# Patient Record
Sex: Female | Born: 1990 | Race: White | Hispanic: No | Marital: Married | Smoking: Never smoker
Health system: Southern US, Community
[De-identification: ages and names within clinical notes are randomized; demographics above are authoritative.]

## PROBLEM LIST (undated history)

## (undated) DIAGNOSIS — O139 Gestational [pregnancy-induced] hypertension without significant proteinuria, unspecified trimester: Secondary | ICD-10-CM

## (undated) HISTORY — PX: WISDOM TOOTH EXTRACTION: SHX21

---

## 2012-06-13 ENCOUNTER — Encounter: Payer: Self-pay | Admitting: Certified Nurse Midwife

## 2012-06-14 ENCOUNTER — Encounter: Payer: Self-pay | Admitting: Nurse Practitioner

## 2012-06-14 ENCOUNTER — Ambulatory Visit (INDEPENDENT_AMBULATORY_CARE_PROVIDER_SITE_OTHER): Payer: BC Managed Care – PPO | Admitting: Certified Nurse Midwife

## 2012-06-14 ENCOUNTER — Encounter: Payer: Self-pay | Admitting: Certified Nurse Midwife

## 2012-06-14 VITALS — BP 100/64 | HR 72 | Resp 12

## 2012-06-14 DIAGNOSIS — B373 Candidiasis of vulva and vagina: Secondary | ICD-10-CM

## 2012-06-14 MED ORDER — TERCONAZOLE 0.4 % VA CREA: 1.0000 | TOPICAL_CREAM | Freq: Every day | VAGINAL | Status: DC

## 2012-06-14 NOTE — Patient Instructions (Signed)
Candida Infection, Adult  A candida infection (also called yeast, fungus and Monilia infection) is an overgrowth of yeast that can occur anywhere on the body. A yeast infection commonly occurs in warm, moist body areas. Usually, the infection remains localized but can spread to become a systemic infection. A yeast infection may be a sign of a more severe disease such as diabetes, leukemia, or AIDS.  A yeast infection can occur in both men and women. In women, Candida vaginitis is a vaginal infection. It is one of the most common causes of vaginitis. Men usually do not have symptoms or know they have an infection until other problems develop. Men may find out they have a yeast infection because their sex partner has a yeast infection. Uncircumcised men are more likely to get a yeast infection than circumcised men. This is because the uncircumcised glans is not exposed to air and does not remain as dry as that of a circumcised glans. Older adults may develop yeast infections around dentures.  CAUSES   Women   Antibiotics.   Steroid medication taken for a long time.   Being overweight (obese).   Diabetes.   Poor immune condition.   Certain serious medical conditions.   Immune suppressive medications for organ transplant patients.   Chemotherapy.   Pregnancy.   Menstration.   Stress and fatigue.   Intravenous drug use.   Oral contraceptives.   Wearing tight-fitting clothes in the crotch area.   Catching it from a sex partner who has a yeast infection.   Spermicide.   Intravenous, urinary, or other catheters.  Men   Catching it from a sex partner who has a yeast infection.   Having oral or anal sex with a person who has the infection.   Spermicide.   Diabetes.   Antibiotics.   Poor immune system.   Medications that suppress the immune system.   Intravenous drug use.   Intravenous, urinary, or other catheters.  SYMPTOMS   Women   Thick, white vaginal discharge.   Vaginal itching.   Redness and  swelling in and around the vagina.   Irritation of the lips of the vagina and perineum.   Blisters on the vaginal lips and perineum.   Painful sexual intercourse.   Low blood sugar (hypoglycemia).   Painful urination.   Bladder infections.   Intestinal problems such as constipation, indigestion, bad breath, bloating, increase in gas, diarrhea, or loose stools.  Men   Men may develop intestinal problems such as constipation, indigestion, bad breath, bloating, increase in gas, diarrhea, or loose stools.   Dry, cracked skin on the penis with itching or discomfort.   Jock itch.   Dry, flaky skin.   Athlete's foot.   Hypoglycemia.  DIAGNOSIS   Women   A history and an exam are performed.   The discharge may be examined under a microscope.   A culture may be taken of the discharge.  Men   A history and an exam are performed.   Any discharge from the penis or areas of cracked skin will be looked at under the microscope and cultured.   Stool samples may be cultured.  TREATMENT   Women   Vaginal antifungal suppositories and creams.   Medicated creams to decrease irritation and itching on the outside of the vagina.   Warm compresses to the perineal area to decrease swelling and discomfort.   Oral antifungal medications.   Medicated vaginal suppositories or cream for repeated or recurrent infections.     Wash and dry the irritation areas before applying the cream.   Eating yogurt with lactobacillus may help with prevention and treatment.   Sometimes painting the vagina with gentian violet solution may help if creams and suppositories do not work.  Men   Antifungal creams and oral antifungal medications.   Sometimes treatment must continue for 30 days after the symptoms go away to prevent recurrence.  HOME CARE INSTRUCTIONS   Women   Use cotton underwear and avoid tight-fitting clothing.   Avoid colored, scented toilet paper and deodorant tampons or pads.   Do not douche.   Keep your diabetes  under control.   Finish all the prescribed medications.   Keep your skin clean and dry.   Consume milk or yogurt with lactobacillus active culture regularly. If you get frequent yeast infections and think that is what the infection is, there are over-the-counter medications that you can get. If the infection does not show healing in 3 days, talk to your caregiver.   Tell your sex partner you have a yeast infection. Your partner may need treatment also, especially if your infection does not clear up or recurs.  Men   Keep your skin clean and dry.   Keep your diabetes under control.   Finish all prescribed medications.   Tell your sex partner that you have a yeast infection so they can be treated if necessary.  SEEK MEDICAL CARE IF:    Your symptoms do not clear up or worsen in one week after treatment.   You have an oral temperature above 102 F (38.9 C).   You have trouble swallowing or eating for a prolonged time.   You develop blisters on and around your vagina.   You develop vaginal bleeding and it is not your menstrual period.   You develop abdominal pain.   You develop intestinal problems as mentioned above.   You get weak or lightheaded.   You have painful or increased urination.   You have pain during sexual intercourse.  MAKE SURE YOU:    Understand these instructions.   Will watch your condition.   Will get help right away if you are not doing well or get worse.  Document Released: 04/22/2004 Document Revised: 06/07/2011 Document Reviewed: 08/04/2009  ExitCare Patient Information 2013 [employer].

## 2012-06-14 NOTE — Progress Notes (Signed)
22 y.o.MarriedCaucasian female G0P0000 with a 10 day(s) history of the following:dyspareunia and local irritation Sexually active: yes Last sexual activity:8days ago. Pt also reports the following associated symptoms: none Patient has nottried over the counter treatment with not applicable relief.  Treated for Bacterial vaginitis 05/15/12. Used medication as directed all symptoms resolved.  No partner change. No STD concerns.  No new personal products.  Wears thongs daily  HPI: Pertinent to diagnosis Objective: Healthy female       Normal affect      Exam:  WJX:BJYNWG                NFA:OZHYQMVHQ: white and thick                Cx:  normal appearance                Uterus:normal size                Adnexa: normal adnexa  Wet Prep shows:WBC's Neg for clue cells  A;Yeast vaginitis  IO:NGEXBMW antifungal see orders.  Rx. Terazol 7 vaginal cream every hs x7 no refills Aveeno sitz bath prn Avoid prolonged thong use OTC lubricant for sexual activity  RV prn if not resolved

## 2012-07-25 NOTE — Progress Notes (Signed)
This encounter was created in error - please disregard.

## 2012-10-30 ENCOUNTER — Ambulatory Visit: Payer: BC Managed Care – PPO | Admitting: Nurse Practitioner

## 2012-10-30 DIAGNOSIS — Z01419 Encounter for gynecological examination (general) (routine) without abnormal findings: Secondary | ICD-10-CM

## 2012-11-02 ENCOUNTER — Encounter: Payer: Self-pay | Admitting: Nurse Practitioner

## 2012-11-02 ENCOUNTER — Ambulatory Visit (INDEPENDENT_AMBULATORY_CARE_PROVIDER_SITE_OTHER): Payer: BC Managed Care – PPO | Admitting: Nurse Practitioner

## 2012-11-02 VITALS — BP 120/60 | HR 56 | Ht 69.0 in | Wt 151.0 lb

## 2012-11-02 DIAGNOSIS — Z01419 Encounter for gynecological examination (general) (routine) without abnormal findings: Secondary | ICD-10-CM

## 2012-11-02 DIAGNOSIS — Z0000 Encounter for general adult medical examination without abnormal findings: Secondary | ICD-10-CM

## 2012-11-02 MED ORDER — NORETHIN ACE-ETH ESTRAD-FE 1-20 MG-MCG PO TABS: 1.0000 | ORAL_TABLET | Freq: Every day | ORAL | Status: DC

## 2012-11-02 NOTE — Patient Instructions (Addendum)
General topics  Next pap or exam is  due in 1 year Take a Women's multivitamin Take 1200 mg. of calcium daily - prefer dietary If any concerns in interim to call back  Breast Self-Awareness Practicing breast self-awareness may pick up problems early, prevent significant medical complications, and possibly save your life. By practicing breast self-awareness, you can become familiar with how your breasts look and feel and if your breasts are changing. This allows you to notice changes early. It can also offer you some reassurance that your breast health is good. One way to learn what is normal for your breasts and whether your breasts are changing is to do a breast self-exam. If you find a lump or something that was not present in the past, it is best to contact your caregiver right away. Other findings that should be evaluated by your caregiver include nipple discharge, especially if it is bloody; skin changes or reddening; areas where the skin seems to be pulled in (retracted); or new lumps and bumps. Breast pain is seldom associated with cancer (malignancy), but should also be evaluated by a caregiver. BREAST SELF-EXAM The best time to examine your breasts is 5 7 days after your menstrual period is over.  ExitCare Patient Information 2013 [employer].   Exercise to Stay Healthy Exercise helps you become and stay healthy. EXERCISE IDEAS AND TIPS Choose exercises that:  You enjoy.  Fit into your day. You do not need to exercise really hard to be healthy. You can do exercises at a slow or medium level and stay healthy. You can:  Stretch before and after working out.  Try yoga, Pilates, or tai chi.  Lift weights.  Walk fast, swim, jog, run, climb stairs, bicycle, dance, or rollerskate.  Take aerobic classes. Exercises that burn about 150 calories:  Running 1  miles in 15 minutes.  Playing volleyball for 45 to 60 minutes.  Washing and waxing a car for 45 to 60  minutes.  Playing touch football for 45 minutes.  Walking 1  miles in 35 minutes.  Pushing a stroller 1  miles in 30 minutes.  Playing basketball for 30 minutes.  Raking leaves for 30 minutes.  Bicycling 5 miles in 30 minutes.  Walking 2 miles in 30 minutes.  Dancing for 30 minutes.  Shoveling snow for 15 minutes.  Swimming laps for 20 minutes.  Walking up stairs for 15 minutes.  Bicycling 4 miles in 15 minutes.  Gardening for 30 to 45 minutes.  Jumping rope for 15 minutes.  Washing windows or floors for 45 to 60 minutes. Document Released: 04/17/2010 Document Revised: 06/07/2011 Document Reviewed: 04/17/2010 Exit[employer] Patient Information 2013 [employer].   Other topics ( that may be useful information):    Sexually Transmitted Disease Sexually transmitted disease (STD) refers to any infection that is passed from person to person during sexual activity. This may happen by way of saliva, semen, blood, vaginal mucus, or urine. Common STDs include:  Gonorrhea.  Chlamydia.  Syphilis.  HIV/AIDS.  Genital herpes.  Hepatitis B and C.  Trichomonas.  Human papillomavirus (HPV).  Pubic lice. CAUSES  An STD may be spread by bacteria, virus, or parasite. A person can get an STD by:  Sexual intercourse with an infected person.  Sharing sex toys with an infected person.  Sharing needles with an infected person.  Having intimate contact with the genitals, mouth, or rectal areas of an infected person. SYMPTOMS  Some people may not have any symptoms, but   they can still pass the infection to others. Different STDs have different symptoms. Symptoms include:  Painful or bloody urination.  Pain in the pelvis, abdomen, vagina, anus, throat, or eyes.  Skin rash, itching, irritation, growths, or sores (lesions). These usually occur in the genital or anal area.  Abnormal vaginal discharge.  Penile discharge in men.  Soft, flesh-colored skin growths in the  genital or anal area.  Fever.  Pain or bleeding during sexual intercourse.  Swollen glands in the groin area.  Yellow skin and eyes (jaundice). This is seen with hepatitis. DIAGNOSIS  To make a diagnosis, your caregiver may:  Take a medical history.  Perform a physical exam.  Take a specimen (culture) to be examined.  Examine a sample of discharge under a microscope.  Perform blood test TREATMENT   Chlamydia, gonorrhea, trichomonas, and syphilis can be cured with antibiotic medicine.  Genital herpes, hepatitis, and HIV can be treated, but not cured, with prescribed medicines. The medicines will lessen the symptoms.  Genital warts from HPV can be treated with medicine or by freezing, burning (electrocautery), or surgery. Warts may come back.  HPV is a virus and cannot be cured with medicine or surgery.However, abnormal areas may be followed very closely by your caregiver and may be removed from the cervix, vagina, or vulva through office procedures or surgery. If your diagnosis is confirmed, your recent sexual partners need treatment. This is true even if they are symptom-free or have a negative culture or evaluation. They should not have sex until their caregiver says it is okay. HOME CARE INSTRUCTIONS  All sexual partners should be informed, tested, and treated for all STDs.  Take your antibiotics as directed. Finish them even if you start to feel better.  Only take over-the-counter or prescription medicines for pain, discomfort, or fever as directed by your caregiver.  Rest.  Eat a balanced diet and drink enough fluids to keep your urine clear or pale yellow.  Do not have sex until treatment is completed and you have followed up with your caregiver. STDs should be checked after treatment.  Keep all follow-up appointments, Pap tests, and blood tests as directed by your caregiver.  Only use latex condoms and water-soluble lubricants during sexual activity. Do not use  petroleum jelly or oils.  Avoid alcohol and illegal drugs.  Get vaccinated for HPV and hepatitis. If you have not received these vaccines in the past, talk to your caregiver about whether one or both might be right for you.  Avoid risky sex practices that can break the skin. The only way to avoid getting an STD is to avoid all sexual activity.Latex condoms and dental dams (for oral sex) will help lessen the risk of getting an STD, but will not completely eliminate the risk. SEEK MEDICAL CARE IF:   You have a fever.  You have any new or worsening symptoms. Document Released: 06/05/2002 Document Revised: 06/07/2011 Document Reviewed: 06/12/2010 ExitCare Patient Information 2013 [employer].    Domestic Abuse You are being battered or abused if someone close to you hits, pushes, or physically hurts you in any way. You also are being abused if you are forced into activities. You are being sexually abused if you are forced to have sexual contact of any kind. You are being emotionally abused if you are made to feel worthless or if you are constantly threatened. It is important to remember that help is available. No one has the right to abuse you. PREVENTION OF FURTHER   ABUSE  Learn the warning signs of danger. This varies with situations but may include: the use of alcohol, threats, isolation from friends and family, or forced sexual contact. Leave if you feel that violence is going to occur.  If you are attacked or beaten, report it to the police so the abuse is documented. You do not have to press charges. The police can protect you while you or the attackers are leaving. Get the officer's name and badge number and a copy of the report.  Find someone you can trust and tell them what is happening to you: your caregiver, a nurse, clergy member, close friend or family member. Feeling ashamed is natural, but remember that you have done nothing wrong. No one deserves abuse. Document Released:  03/12/2000 Document Revised: 06/07/2011 Document Reviewed: 05/21/2010 ExitCare Patient Information 2013 [employer].    How Much is Too Much Alcohol? Drinking too much alcohol can cause injury, accidents, and health problems. These types of problems can include:   Car crashes.  Falls.  Family fighting (domestic violence).  Drowning.  Fights.  Injuries.  Burns.  Damage to certain organs.  Having a baby with birth defects. ONE DRINK CAN BE TOO MUCH WHEN YOU ARE:  Working.  Pregnant or breastfeeding.  Taking medicines. Ask your doctor.  Driving or planning to drive. If you or someone you know has a drinking problem, get help from a doctor.  Document Released: 01/09/2009 Document Revised: 06/07/2011 Document Reviewed: 01/09/2009 ExitCare Patient Information 2013 [employer].   Smoking Hazards Smoking cigarettes is extremely bad for your health. Tobacco smoke has over 200 known poisons in it. There are over 60 chemicals in tobacco smoke that cause cancer. Some of the chemicals found in cigarette smoke include:   Cyanide.  Benzene.  Formaldehyde.  Methanol (wood alcohol).  Acetylene (fuel used in welding torches).  Ammonia. Cigarette smoke also contains the poisonous gases nitrogen oxide and carbon monoxide.  Cigarette smokers have an increased risk of many serious medical problems and Smoking causes approximately:  90% of all lung cancer deaths in men.  80% of all lung cancer deaths in women.  90% of deaths from chronic obstructive lung disease. Compared with nonsmokers, smoking increases the risk of:  Coronary heart disease by 2 to 4 times.  Stroke by 2 to 4 times.  Men developing lung cancer by 23 times.  Women developing lung cancer by 13 times.  Dying from chronic obstructive lung diseases by 12 times.  . Smoking is the most preventable cause of death and disease in our society.  WHY IS SMOKING ADDICTIVE?  Nicotine is the chemical  agent in tobacco that is capable of causing addiction or dependence.  When you smoke and inhale, nicotine is absorbed rapidly into the bloodstream through your lungs. Nicotine absorbed through the lungs is capable of creating a powerful addiction. Both inhaled and non-inhaled nicotine may be addictive.  Addiction studies of cigarettes and spit tobacco show that addiction to nicotine occurs mainly during the teen years, when young people begin using tobacco products. WHAT ARE THE BENEFITS OF QUITTING?  There are many health benefits to quitting smoking.   Likelihood of developing cancer and heart disease decreases. Health improvements are seen almost immediately.  Blood pressure, pulse rate, and breathing patterns start returning to normal soon after quitting. QUITTING SMOKING   American Lung Association - 1-800-LUNGUSA  American Cancer Society - 1-800-ACS-2345 Document Released: 04/22/2004 Document Revised: 06/07/2011 Document Reviewed: 12/25/2008 ExitCare Patient Information 2013 [employer].   Stress Management Stress is a state of physical or mental tension that often results from changes in your life or normal routine. Some common causes of stress are:  Death of a loved one.  Injuries or severe illnesses.  Getting fired or changing jobs.  Moving into a new home. Other causes may be:  Sexual problems.  Business or financial losses.  Taking on a large debt.  Regular conflict with someone at home or at work.  Constant tiredness from lack of sleep. It is not just bad things that are stressful. It may be stressful to:  Win the lottery.  Get married.  Buy a new car. The amount of stress that can be easily tolerated varies from person to person. Changes generally cause stress, regardless of the types of change. Too much stress can affect your health. It may lead to physical or emotional problems. Too little stress (boredom) may also become stressful. SUGGESTIONS TO  REDUCE STRESS:  Talk things over with your family and friends. It often is helpful to share your concerns and worries. If you feel your problem is serious, you may want to get help from a professional counselor.  Consider your problems one at a time instead of lumping them all together. Trying to take care of everything at once may seem impossible. List all the things you need to do and then start with the most important one. Set a goal to accomplish 2 or 3 things each day. If you expect to do too many in a single day you will naturally fail, causing you to feel even more stressed.  Do not use alcohol or drugs to relieve stress. Although you may feel better for a short time, they do not remove the problems that caused the stress. They can also be habit forming.  Exercise regularly - at least 3 times per week. Physical exercise can help to relieve that "uptight" feeling and will relax you.  The shortest distance between despair and hope is often a good night's sleep.  Go to bed and get up on time allowing yourself time for appointments without being rushed.  Take a short "time-out" period from any stressful situation that occurs during the day. Close your eyes and take some deep breaths. Starting with the muscles in your face, tense them, hold it for a few seconds, then relax. Repeat this with the muscles in your neck, shoulders, hand, stomach, back and legs.  Take good care of yourself. Eat a balanced diet and get plenty of rest.  Schedule time for having fun. Take a break from your daily routine to relax. HOME CARE INSTRUCTIONS   Call if you feel overwhelmed by your problems and feel you can no longer manage them on your own.  Return immediately if you feel like hurting yourself or someone else. Document Released: 09/08/2000 Document Revised: 06/07/2011 Document Reviewed: 05/01/2007 ExitCare Patient Information 2013 [employer].   

## 2012-11-02 NOTE — Progress Notes (Signed)
Patient ID: Tara White, female   DOB: 1990-10-05, 22 y.o.   MRN: [id number] 22 y.o. G0P0 Married Caucasian Fe here for annual exam.  Menses last now at 5-6 days. Lighter cycles than before OCP. Some cramps but improved.  Not planning family yet, maybe in 1-2 years.  Married for 1 year and 3 months. Celebrated anniversary in [place].  Patient's last menstrual period was 10/17/2012.          Sexually active: yes  The current method of family planning is OCP (estrogen/progesterone).    Exercising: no  The patient does not participate in regular exercise at present. Smoker:  no  Health Maintenance: Pap:  10/26/11, WNL TDaP:  2009 Gardasil completed: 10/26/11 Labs: HB: PCP  Urine: Negative, pH 5.0   reports that she has never smoked. She does not have any smokeless tobacco history on file. She reports that she drinks about 0.5 ounces of alcohol per week. She reports that she does not use illicit drugs.  No past medical history on file.  Past Surgical History  Procedure Laterality Date  . Wisdom tooth extraction      Current Outpatient Prescriptions  Medication Sig Dispense Refill  . Norethin Ace-Eth Estrad-FE (LOESTRIN FE 1/20 PO) Take by mouth daily.      Marland Kitchen terconazole (TERAZOL 7) 0.4 % vaginal cream Place 1 applicator vaginally at bedtime. One applicator full QHS for seven days of therapy  45 g  0   No current facility-administered medications for this visit.    Family History  Problem Relation Age of Onset  . Hypothyroidism Mother     ROS:  Pertinent items are noted in HPI.  Otherwise, a comprehensive ROS was negative.  Exam:   BP 120/60  Pulse 56  Ht 5\' 9"  (1.753 m)  Wt 151 lb (68.493 kg)  BMI 22.29 kg/m2  LMP 10/17/2012 Height: 5\' 9"  (175.3 cm)  Ht Readings from Last 3 Encounters:  11/02/12 5\' 9"  (1.753 m)    General appearance: alert, cooperative and appears stated age Head: Normocephalic, without obvious abnormality, atraumatic Neck: no adenopathy, supple,  symmetrical, trachea midline and thyroid normal to inspection and palpation Lungs: clear to auscultation bilaterally Breasts: normal appearance, no masses or tenderness Heart: regular rate and rhythm Abdomen: soft, non-tender; no masses,  no organomegaly Extremities: extremities normal, atraumatic, no cyanosis or edema Skin: Skin color, texture, turgor normal. No rashes or lesions Lymph nodes: Cervical, supraclavicular, and axillary nodes normal. No abnormal inguinal nodes palpated Neurologic: Grossly normal   Pelvic: External genitalia:  no lesions              Urethra:  normal appearing urethra with no masses, tenderness or lesions              Bartholin's and Skene's: normal                 Vagina: normal appearing vagina with normal color and discharge, no lesions              Cervix: anteverted              Pap taken: no Bimanual Exam:  Uterus:  normal size, contour, position, consistency, mobility, non-tender              Adnexa: no mass, fullness, tenderness               Rectovaginal: Confirms               Anus:  normal sphincter  tone, no lesions  A:  Well Woman with normal exam  OCP for contraception  P:   Pap smear as per guidelines   Refill Loestrin Fe 1/20 for 1 year  Counseled on breast self exam, adequate intake of calcium and vitamin D, diet and exercise return annually or prn  An After Visit Summary was printed and given to the patient.

## 2012-11-03 NOTE — Progress Notes (Signed)
Encounter reviewed by Dr. Derral Colucci Silva.  

## 2013-01-08 ENCOUNTER — Encounter: Payer: Self-pay | Admitting: Nurse Practitioner

## 2013-01-08 ENCOUNTER — Ambulatory Visit (INDEPENDENT_AMBULATORY_CARE_PROVIDER_SITE_OTHER): Payer: BC Managed Care – PPO | Admitting: Nurse Practitioner

## 2013-01-08 VITALS — BP 110/66 | HR 56 | Temp 98.7°F | Ht 69.0 in | Wt 157.0 lb

## 2013-01-08 DIAGNOSIS — B373 Candidiasis of vulva and vagina: Secondary | ICD-10-CM

## 2013-01-08 MED ORDER — NONFORMULARY OR COMPOUNDED ITEM: Status: DC

## 2013-01-08 MED ORDER — FLUCONAZOLE 150 MG PO TABS: 150.0000 mg | ORAL_TABLET | Freq: Once | ORAL | Status: DC

## 2013-01-08 NOTE — Patient Instructions (Signed)
Monilial Vaginitis  Vaginitis in a soreness, swelling and redness (inflammation) of the vagina and vulva. Monilial vaginitis is not a sexually transmitted infection.  CAUSES   Yeast vaginitis is caused by yeast (candida) that is normally found in your vagina. With a yeast infection, the candida has overgrown in number to a point that upsets the chemical balance.  SYMPTOMS   · White, thick vaginal discharge.  · Swelling, itching, redness and irritation of the vagina and possibly the lips of the vagina (vulva).  · Burning or painful urination.  · Painful intercourse.  DIAGNOSIS   Things that may contribute to monilial vaginitis are:  · Postmenopausal and virginal states.  · Pregnancy.  · Infections.  · Being tired, sick or stressed, especially if you had monilial vaginitis in the past.  · Diabetes. Good control will help lower the chance.  · Birth control pills.  · Tight fitting garments.  · Using bubble bath, feminine sprays, douches or deodorant tampons.  · Taking certain medications that kill germs (antibiotics).  · Sporadic recurrence can occur if you become ill.  TREATMENT   Your caregiver will give you medication.  · There are several kinds of anti monilial vaginal creams and suppositories specific for monilial vaginitis. For recurrent yeast infections, use a suppository or cream in the vagina 2 times a week, or as directed.  · Anti-monilial or steroid cream for the itching or irritation of the vulva may also be used. Get your caregiver's permission.  · Painting the vagina with methylene blue solution may help if the monilial cream does not work.  · Eating yogurt may help prevent monilial vaginitis.  HOME CARE INSTRUCTIONS   · Finish all medication as prescribed.  · Do not have sex until treatment is completed or after your caregiver tells you it is okay.  · Take warm sitz baths.  · Do not douche.  · Do not use tampons, especially scented ones.  · Wear cotton underwear.  · Avoid tight pants and panty  hose.  · Tell your sexual partner that you have a yeast infection. They should go to their caregiver if they have symptoms such as mild rash or itching.  · Your sexual partner should be treated as well if your infection is difficult to eliminate.  · Practice safer sex. Use condoms.  · Some vaginal medications cause latex condoms to fail. Vaginal medications that harm condoms are:  · Cleocin cream.  · Butoconazole (Femstat®).  · Terconazole (Terazol®) vaginal suppository.  · Miconazole (Monistat®) (may be purchased over the counter).  SEEK MEDICAL CARE IF:   · You have a temperature by mouth above 102° F (38.9° C).  · The infection is getting worse after 2 days of treatment.  · The infection is not getting better after 3 days of treatment.  · You develop blisters in or around your vagina.  · You develop vaginal bleeding, and it is not your menstrual period.  · You have pain when you urinate.  · You develop intestinal problems.  · You have pain with sexual intercourse.  Document Released: 12/23/2004 Document Revised: 06/07/2011 Document Reviewed: 09/06/2008  ExitCare® Patient Information ©2014 [employer].

## 2013-01-08 NOTE — Progress Notes (Signed)
Subjective:     Patient ID: Tara White, female   DOB: [birth year], 22 y.o.   MRN: [id number]  HPI  22 yo G0,WM Fe  presents with history of vaginal itching X 4 days.  Some dryness with SA which also makes these symptoms worse. No bladder symptoms. No recent antibiotics.  She is quite frustrated that she continues to get yeast vaginitis.   Review of Systems  Constitutional: Negative for fever, chills and fatigue.  HENT: Negative.   Respiratory: Negative.   Gastrointestinal: Negative.   Genitourinary: Positive for vaginal discharge and dyspareunia.  Musculoskeletal: Negative.   Skin: Negative.   Neurological: Negative.   Psychiatric/Behavioral: Negative.        Objective:   Physical Exam  Constitutional: She appears well-developed and well-nourished.  Cardiovascular: Normal rate.   Pulmonary/Chest: Effort normal.  Abdominal: Soft. She exhibits no distension. There is no tenderness. There is no rebound and no guarding.  Genitourinary:  White thick vaginal discharge.   External areas of thick labia folds with small cuts consistent with chronic yeast.  Wet Prep:  PH: 4.0; NSS negative; KOH: + yeast.       Assessment:     Yeast vaginitis acute and chronic    Plan:     Discussed vaginal health care No Changes in soaps and detergents. Will try Boric acid caps for chronic management as directed Will do Diflucan 150 mg for now # 2 Lubrication prn for SA

## 2013-01-11 NOTE — Progress Notes (Signed)
Encounter reviewed by Dr. Brook Silva.  

## 2013-06-12 ENCOUNTER — Telehealth: Payer: Self-pay | Admitting: Nurse Practitioner

## 2013-06-12 NOTE — Telephone Encounter (Signed)
Pt wants to talk to the nurse about updating her medication.

## 2013-06-13 NOTE — Telephone Encounter (Signed)
LMTCB 06/13/13

## 2013-06-14 ENCOUNTER — Other Ambulatory Visit: Payer: Self-pay | Admitting: Obstetrics and Gynecology

## 2013-06-14 MED ORDER — NORETHINDRONE ACET-ETHINYL EST 1.5-30 MG-MCG PO TABS: 1.0000 | ORAL_TABLET | Freq: Every day | ORAL | Status: DC

## 2013-06-14 NOTE — Telephone Encounter (Signed)
Returning a call to Tracy °

## 2013-06-14 NOTE — Telephone Encounter (Signed)
Ok to try LoEstrin 1.5/30.  This is a stronger pill with 7 placebo pills at the end. Have her try it to see if the cycles are shorter and lighter.  I will put in an order.

## 2013-06-14 NOTE — Telephone Encounter (Signed)
Spoke with patient and message from Dr. Quincy Simmonds given.  She will start a this new pack after her next period and is agreeable. Will follow up prn.   Routing to provider for final review. Patient agreeable to disposition. Will close encounter

## 2013-06-14 NOTE — Telephone Encounter (Signed)
Spoke with patient. She is currently on generic Loestrin FE. She she is wondering about what her options would be to have a shorter, lighter period. She is having a period that lasts for 7 days.  She does not want to stop her cycles completely but wondering if she can change the strength of her pills to have less of a cycle. She would like to stay on ocp. I advised that I would send a message to the covering provider and would call her back with response. Patient agreeable to plan.     Routing to Dr. Quincy Simmonds as covering and  Cc Milford Cage, FNP

## 2013-10-30 ENCOUNTER — Ambulatory Visit: Payer: BC Managed Care – PPO | Admitting: Certified Nurse Midwife

## 2013-10-31 ENCOUNTER — Telehealth: Payer: Self-pay | Admitting: Certified Nurse Midwife

## 2013-10-31 NOTE — Telephone Encounter (Signed)
Patient [facility] her appt yesterday for yeast inf. Call to pt see if she still needed to reschedule the appt. [facility] fee applied

## 2014-01-15 ENCOUNTER — Ambulatory Visit: Payer: BC Managed Care – PPO | Admitting: Nurse Practitioner

## 2014-01-28 ENCOUNTER — Encounter: Payer: Self-pay | Admitting: Nurse Practitioner

## 2014-01-28 ENCOUNTER — Ambulatory Visit (INDEPENDENT_AMBULATORY_CARE_PROVIDER_SITE_OTHER): Payer: BC Managed Care – PPO | Admitting: Nurse Practitioner

## 2014-01-28 VITALS — BP 118/76 | HR 60 | Resp 14 | Ht 68.5 in | Wt 159.2 lb

## 2014-01-28 DIAGNOSIS — Z01419 Encounter for gynecological examination (general) (routine) without abnormal findings: Secondary | ICD-10-CM

## 2014-01-28 MED ORDER — NONFORMULARY OR COMPOUNDED ITEM: Status: DC

## 2014-01-28 MED ORDER — NORETHINDRONE ACET-ETHINYL EST 1.5-30 MG-MCG PO TABS: 1.0000 | ORAL_TABLET | Freq: Every day | ORAL | Status: DC

## 2014-01-28 NOTE — Progress Notes (Signed)
Patient ID: Tara White, female   DOB: 08/15/1990, 23 y.o.   MRN: [id number] 23 y.o. G0P0 Married Caucasian Fe here for annual exam.  Married for 2.5 years.  Starting on a family in a few years.  Menses no at 5 days.  Heavy X 2-3 days., moderate to light at end. Super tampon and pad.  Patient's last menstrual period was 01/28/2014.          Sexually active: yes  The current method of family planning is OCP (estrogen/progesterone).  Exercising: Yes The patient is a Contractor. Smoker: no  Health Maintenance: Pap: 10/26/11, WNL TDaP: 2009 Gardasil completed: 10/26/11 Labs:  HB:  Declined labs  Urine:  Mod. RBC's but patient currently on cycle.   reports that she has never smoked. She has never used smokeless tobacco. She reports that she drinks about 1.2 oz of alcohol per week. She reports that she does not use illicit drugs.  History reviewed. No pertinent past medical history.  Past Surgical History  Procedure Laterality Date  . Wisdom tooth extraction      Current Outpatient Prescriptions  Medication Sig Dispense Refill  . NONFORMULARY OR COMPOUNDED ITEM Boric acid suppository 200mg , size 0 gelatin capsule,1 pv three weekly.  With flare use daily pv for 7 days. 30 each 12  . Norethindrone Acetate-Ethinyl Estradiol (LOESTRIN 1.5/30, 21,) 1.5-30 MG-MCG tablet Take 1 tablet by mouth daily. 1 Package 7   No current facility-administered medications for this visit.    Family History  Problem Relation Age of Onset  . Hypothyroidism Mother   . Heart failure Paternal Grandfather     ROS:  Pertinent items are noted in HPI.  Otherwise, a comprehensive ROS was negative.  Exam:   BP 118/76 mmHg  Pulse 60  Resp 14  Ht 5' 8.5" (1.74 m)  Wt 159 lb 3.2 oz (72.213 kg)  BMI 23.85 kg/m2  LMP 01/28/2014 Height: 5' 8.5" (174 cm)  Ht Readings from Last 3 Encounters:  01/28/14 5' 8.5" (1.74 m)  01/08/13 5\' 9"  (1.753 m)  11/02/12 5\' 9"  (1.753 m)    General appearance: alert,  cooperative and appears stated age Head: Normocephalic, without obvious abnormality, atraumatic Neck: no adenopathy, supple, symmetrical, trachea midline and thyroid normal to inspection and palpation Lungs: clear to auscultation bilaterally Breasts: normal appearance, no masses or tenderness Heart: regular rate and rhythm Abdomen: soft, non-tender; no masses,  no organomegaly Extremities: extremities normal, atraumatic, no cyanosis or edema Skin: Skin color, texture, turgor normal. No rashes or lesions Lymph nodes: Cervical, supraclavicular, and axillary nodes normal. No abnormal inguinal nodes palpated Neurologic: Grossly normal   Pelvic: External genitalia:  no lesions              Urethra:  normal appearing urethra with no masses, tenderness or lesions              Bartholin's and Skene's: normal                 Vagina: normal appearing vagina with normal color and discharge, no lesions              Cervix: anteverted              Pap taken: No. Bimanual Exam:  Uterus:  normal size, contour, position, consistency, mobility, non-tender              Adnexa: no mass, fullness, tenderness  Rectovaginal: Confirms               Anus:  normal sphincter tone, no lesions  A:  Well Woman with normal exam  OCP for contraception  History of menorrhagia  History of chronic yeast  P:   Reviewed health and wellness pertinent to exam  Pap smear not taken today  Refill on Loestrin 1.5/30 for a year  Refill on Boric acid capsule  Counseled on breast self exam, use and side effects of OCP's, adequate intake of calcium and vitamin D, diet and exercise return annually or prn  An After Visit Summary was printed and given to the patient.

## 2014-01-28 NOTE — Patient Instructions (Signed)

## 2014-01-30 NOTE — Progress Notes (Signed)
Encounter reviewed by Dr. Brook Silva.  

## 2014-02-09 ENCOUNTER — Ambulatory Visit (INDEPENDENT_AMBULATORY_CARE_PROVIDER_SITE_OTHER): Payer: BC Managed Care – PPO | Admitting: Podiatry

## 2014-02-09 ENCOUNTER — Encounter: Payer: Self-pay | Admitting: Podiatry

## 2014-02-09 VITALS — BP 121/74 | HR 60 | Resp 12

## 2014-02-09 DIAGNOSIS — IMO0002 Reserved for concepts with insufficient information to code with codable children: Secondary | ICD-10-CM | POA: Insufficient documentation

## 2014-02-09 DIAGNOSIS — L03012 Cellulitis of left finger: Secondary | ICD-10-CM

## 2014-02-09 MED ORDER — CEPHALEXIN 500 MG PO CAPS: 500.0000 mg | ORAL_CAPSULE | Freq: Three times a day (TID) | ORAL | Status: DC

## 2014-02-09 NOTE — Addendum Note (Signed)
Addended by: Elta Guadeloupe on: 02/09/2014 09:05 AM   Modules accepted: Orders

## 2014-02-09 NOTE — Patient Instructions (Addendum)

## 2014-02-09 NOTE — Progress Notes (Signed)
   Subjective:    Patient ID: Tara White, female    DOB: December 26, 1990, 23 y.o.   MRN: [id number]  HPI Tara White, 23 year old female, presents the office today with complaints of left big toe nail pain, infection. She states that the area has been swollen and red over the last 4 weeks on the outside border of the left big toenail. She has recently startedincreasing pus in the area. She states that there is pain along the area particularly with pressure on his shoe gear. She's been applying Neosporin to it without much resolution in symptoms. She previously has had an ingrown toenail before for which she has drained the infection herself. Also states that her nails up and discolored for many years. She's had no prior treatment for that. No other complaints at this time.   Review of Systems  All other systems reviewed and are negative.      Objective:   Physical Exam Objective: AAO x3, NAD DP/PT pulses palpable bilaterally, CRT less than 3 seconds Protective sensation intact with Simms Weinstein monofilament, vibratory sensation intact, Achilles tendon reflex intact The lateral border of the left hallux nail there is erythema extending to the level of the IPJ. There is tenderness palpation of the lateral border of the left hallux nail without any pain over the medial border. There is small granuloma tissue-like formation at the distal aspect of the lateral nail border. There is purulence identified from the nail border. Nails have yellow discoloration and are dystrophic/brittle. No surrounding erythema or drainage from the remaining nail sites. MMT 5/5, ROM WNL No calf pain with compression/swelling/warmth/erythema.      Assessment & Plan:  23 year old female with left lateral hallux nail border paronychia; likely onychomycosis -Conservative vs. Surgical treatment discussed with the patient including all alternatives, risks, complications.  -At this time, recommend a partial nail avulsion due  to the paronychia. Risks and complications discussed with the patient. She understands and verbally consents to the procedure. Under sterile conditions a total of 2.5 cc of a 1:1 mixture of 2% lidocaine plain and 0.5% Marcaine plain was infiltrated in a hallux block fashion to the left hallux. The area was then prepped in a sterile fashion. A tourniquet was then applied. The lateral border of the left hallux nail was then sharply excised, making sure to remove the entire nail border. There was purulence identified from the nail border. Once the nail was removed, no further purulence or pockets of infection were noted. The area was irrigated. Silvadene was applied followed by a dry sterile dressing. After application of the dressing, the tourniquet was then removed and there was noted to be an immediate CRT to the digit. Patient tolerated the procedure well without complications.  -Post-procedure instructions were discussed with the patient and they verbally understood. -Keflex prescribed.  -Nail which was removed was sent to [facility] labs for evaluation of possible onychomycosis.  -Follow-up in 1 week, or sooner if any problems are to arise. Monitor for any clinical signs/symptoms of infection and directed to call the office immediately if any are to occur, or go directly to the ER. Call with any questions/concers/change in symptoms.

## 2014-02-19 ENCOUNTER — Encounter: Payer: Self-pay | Admitting: Nurse Practitioner

## 2014-02-19 ENCOUNTER — Telehealth: Payer: Self-pay | Admitting: Nurse Practitioner

## 2014-02-19 ENCOUNTER — Ambulatory Visit (INDEPENDENT_AMBULATORY_CARE_PROVIDER_SITE_OTHER): Payer: BC Managed Care – PPO | Admitting: Nurse Practitioner

## 2014-02-19 VITALS — BP 122/80 | HR 68 | Ht 68.5 in | Wt 158.0 lb

## 2014-02-19 DIAGNOSIS — N898 Other specified noninflammatory disorders of vagina: Secondary | ICD-10-CM

## 2014-02-19 DIAGNOSIS — B3731 Acute candidiasis of vulva and vagina: Secondary | ICD-10-CM

## 2014-02-19 DIAGNOSIS — B373 Candidiasis of vulva and vagina: Secondary | ICD-10-CM

## 2014-02-19 MED ORDER — TERCONAZOLE 0.4 % VA CREA: 1.0000 | TOPICAL_CREAM | Freq: Every day | VAGINAL | Status: DC

## 2014-02-19 NOTE — Progress Notes (Signed)
23 y.o. SW Fe here with complaint of vaginal symptoms of itching, burning, and increase discharge. Describes discharge as white and thick. Recent antibiotics with Keflex for ingrown toenail removal.  Onset of symptoms 7 days ago. Denies new personal products or vaginal dryness.  She has taken 2 Diflucan without help.  Last Wednesday and again on Saturday.   No STD concerns. She is on OCP for contraception.  Urinary symptoms: none.   O:Healthy female WDWN Affect: normal, orientation x 3  Exam: no acute distress Abdomen: soft and non tender Lymph node: no enlargement or tenderness Pelvic exam: External genital:  BUS: redness and pealing of skin consistent with yeast Vagina: white discharge noted. Ph:   ,Wet prep taken Cervix: normal, non tender     Affirm Wet Prep results are pending   A: Yeast vaginitis  R/O BV as well   P: Discussed findings of yeast and etiology. Discussed Aveeno or baking soda sitz bath for comfort. Avoid moist clothes or pads for extended period of time. If working out in gym clothes or swim suits for long periods of time change underwear or bottoms of swimsuit if possible. Olive Oil use for skin protection prior to activity can be used to external skin.  Rx: Terazol vaginal cream HS X 7  Await Affirm results and treat if needed  RV prn

## 2014-02-19 NOTE — Patient Instructions (Signed)

## 2014-02-19 NOTE — Telephone Encounter (Signed)
Spoke with patient. Patient states that she thinks she has a bacterial infection and is only able to be seen around 2:45pm today. Appointment scheduled for 3:30pm today with Milford Cage, [place]. Patient is agreeable to date and time.  Routing to provider for final review. Patient agreeable to disposition. Will close encounter.

## 2014-02-19 NOTE — Telephone Encounter (Signed)
Patient thinks she has a bacterial infection and was asking for a prescription called to her pharmacy. I told patient she probably will  need an appointment. Patient said she is only available around 2:45 today. I am unable to schedule her today due to schedule being held at providers request.

## 2014-02-20 ENCOUNTER — Telehealth: Payer: Self-pay | Admitting: *Deleted

## 2014-02-20 ENCOUNTER — Encounter: Payer: Self-pay | Admitting: Podiatry

## 2014-02-20 ENCOUNTER — Ambulatory Visit (INDEPENDENT_AMBULATORY_CARE_PROVIDER_SITE_OTHER): Payer: BC Managed Care – PPO | Admitting: Podiatry

## 2014-02-20 VITALS — BP 138/80 | HR 55 | Resp 18

## 2014-02-20 DIAGNOSIS — L03012 Cellulitis of left finger: Secondary | ICD-10-CM

## 2014-02-20 LAB — WET PREP BY MOLECULAR PROBE
Candida species: NEGATIVE
GARDNERELLA VAGINALIS: POSITIVE — AB
Trichomonas vaginosis: NEGATIVE

## 2014-02-20 MED ORDER — METRONIDAZOLE 500 MG PO TABS: 500.0000 mg | ORAL_TABLET | Freq: Two times a day (BID) | ORAL | Status: DC

## 2014-02-20 NOTE — Telephone Encounter (Signed)
I have attempted to contact this patient by phone with the following results: left message to return call to [place] at [phone] on answering machine (mobile per [facility]).  No personal information given.  [phone] (Mobile)

## 2014-02-20 NOTE — Telephone Encounter (Signed)
-----   Message from Lyman Speller, MD sent at 02/20/2014 11:20 AM EST ----- Please inform pt, test is positive for BV.  Can treat with metrogel 0.75% qhs x 5 days or flagyl 500mg  bid x 7 days.  Pt needs to call back if still symptomatic.

## 2014-02-20 NOTE — Patient Instructions (Signed)
Continue soaking in epsom salt soaks and cover with antibiotic ointment and a band-aid until completely healed. Can leave uncovered at night. Monitor for any signs/symptoms of infection. Call the office immediately if any occur or go directly to the emergency room. Call with any questions/concerns.

## 2014-02-20 NOTE — Addendum Note (Signed)
Addended by: Graylon Good on: 02/20/2014 12:23 PM   Modules accepted: Orders

## 2014-02-20 NOTE — Progress Notes (Signed)
Reviewed personally.  M. Suzanne Delos Klich, MD.  

## 2014-02-25 NOTE — Progress Notes (Signed)
Patient ID: Tara White, female   DOB: [birth year], 23 y.o.   MRN: [id number]  Subjective: 23 year old female presents for follow-up evaluation status post left lateral hallux partial nail avulsion secondary to paronychia. She states that the area has healed. She does that she periodically gets some drainage from the area however denies any purulence. She states there is some mild redness remaining however it is decreased compared to prior evaluation. She denies any pain to the area. She has discontinue soaking or covering the area. No other complaints at this time. Denies any acute changes since last appointment.  Objective: AAO x3, NAD DP/PT pulses palpable bilaterally, CRT less than 3 seconds Protective sensation intact with Simms Weinstein monofilament, vibratory sensation intact, Achilles tendon reflex intact Status post left lateral hallux partial nail avulsion. There is small amount of hyperkeratotic tissue within the procedure site. There is mild erythema to the area however descended family decreased compared to prior. There is no drainage/purulent identified. No ascending saline's. No areas of fluctuance or crepitus. No tenderness to palpation overlying procedure site. MMT 5/5, ROM WNL No pain with calf compression, swelling, warmth, erythema.  Assessment: 23 year old female status post left lateral hallux partial nail avulsion secondary to paronychia  Plan: -Treatment options discussed including alternatives, risks, complications. -Small amount of hyperkeratotic tissue sharply debrided from the procedure site without complications. -Recommend continuing to soak the foot twice a day and Epsom salts followed by interbody ointment and a Band-Aid and the taper can leave uncovered at night. Continue this until completely healed. Monitor for any clinical signs or symptoms of infection and directed to call the office immediately if any are to occur or go to the emergency room. [facility] call patient  with results of the nail biopsy once they are obtained. -Follow-up in 2 weeks if the area has not completely healed after his any redness. Also follow-up sooner if any increase in symptoms. In the meantime, call the office with any questions, concerns, change in symptoms.

## 2014-02-26 NOTE — Telephone Encounter (Signed)
Patient has a question regarding the medication prescribed for her bacterial infection.

## 2014-02-26 NOTE — Telephone Encounter (Signed)
Spoke with patient. Patient states that she is currently taking Flagyl for BV. Patient has 2 1/2 days left to complete Flagyl. Patient has developed vaginal irritation and discomfort over the last two days. "I know the nurse told me this could cause a yeast infection and this is exactly what they felt like in the past." Patient has current Terazol rx for yeast infection and would like to know if she can use this at the same time as treatment for BV. "I didn't know if that would just counteract it and make it come back?" Advised patient to complete prescription of Flagyl before starting treatment for yeast. Advised may use OTC hydrocortisone ointment and Aveeno sitz bath for comfort until she has completed Flagyl rx. Patient is agreeable and verbalizes understanding.  Routing to provider for final review. Patient agreeable to disposition. Will close encounter

## 2014-03-04 ENCOUNTER — Encounter: Payer: Self-pay | Admitting: Certified Nurse Midwife

## 2014-03-04 ENCOUNTER — Ambulatory Visit (INDEPENDENT_AMBULATORY_CARE_PROVIDER_SITE_OTHER): Payer: BC Managed Care – PPO | Admitting: Certified Nurse Midwife

## 2014-03-04 VITALS — BP 104/64 | HR 64 | Resp 16 | Ht 68.5 in | Wt 157.0 lb

## 2014-03-04 DIAGNOSIS — N762 Acute vulvitis: Secondary | ICD-10-CM

## 2014-03-04 MED ORDER — NYSTATIN 100000 UNIT/GM EX CREA: 1.0000 "application " | TOPICAL_CREAM | Freq: Two times a day (BID) | CUTANEOUS | Status: DC

## 2014-03-04 NOTE — Patient Instructions (Signed)

## 2014-03-04 NOTE — Progress Notes (Signed)
23 y.o. married white female  g0p0 here with complaint of vaginal symptoms of external irritation and discomfort with intercourse." Feels like I need to use lotion inside". No STD concerns. No new personal products. Patient was treated 02/19/14 for BV with Metrogel with discharge resolved and then developed discomfort with sexual activity so treated with Diflucan 4 days ago. Symptoms have resolved except for external area. No STD concerns. 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 Tries to stay dry, but doesn't change underwear. Partner denies symptoms. No other health issues. Contraception OCP.   O:Healthy female WDWN Affect: normal, orientation x 3  Exam: Abdomen:soft non tender Lymph node: no enlargement or tenderness Pelvic exam: External genital: increase pink, with scaling, non tender, some white exudate wet prep taken BUS: negative Vagina: normal appearing discharge noted. Ph:3.5   ,Wet prep taken Cervix: normal, non tender Uterus: normal, non tender Adnexa:normal, non tender, no masses or fullness noted   Wet Prep results: yeast external only  A: Yeast Vulvitis   P:Discussed findings of yeast vulvitis and etiology. Discussed Aveeno or baking soda sitz bath for comfort. Avoid moist clothes or pads for extended period of time. If working out in gym clothes or swim suits for long periods of time change underwear or bottoms of swimsuit if possible. Coconut oil can be used for skin protection prior to activity can be used to external skin. Rx: Nystatin see order with instructions  Rv prn

## 2014-03-06 NOTE — Progress Notes (Signed)
Reviewed personally.  M. Suzanne Presly Steinruck, MD.  

## 2014-03-15 ENCOUNTER — Encounter: Payer: Self-pay | Admitting: Podiatry

## 2014-04-03 ENCOUNTER — Ambulatory Visit: Payer: BC Managed Care – PPO | Admitting: Podiatry

## 2014-04-05 ENCOUNTER — Ambulatory Visit (INDEPENDENT_AMBULATORY_CARE_PROVIDER_SITE_OTHER): Payer: 59 | Admitting: Podiatry

## 2014-04-05 ENCOUNTER — Encounter: Payer: Self-pay | Admitting: Podiatry

## 2014-04-05 VITALS — BP 129/78 | HR 66 | Resp 10

## 2014-04-05 DIAGNOSIS — B351 Tinea unguium: Secondary | ICD-10-CM

## 2014-04-05 MED ORDER — TAVABOROLE 5 % EX SOLN: 1.0000 [drp] | CUTANEOUS | Status: DC

## 2014-04-08 NOTE — Progress Notes (Signed)
Patient ID: Tara White, female   DOB: 05-04-1990, 24 y.o.   MRN: [id number]  Subjective: 24 year old female returns the office today to discuss nail biopsy results. She denies any acute changes since last appointment no other complaints at this time. She states that the procedure site and left lateral hallux has healed and she's had no problems with the area.  Objective: AAO x3, NAD DP/PT pulses palpable bilaterally, CRT less than 3 seconds Protective sensation intact with Simms Weinstein monofilament, vibratory sensation intact, Achilles tendon reflex intact Left lateral hallux partial nail avulsion site has healed at this time. There is no tenderness to palpation overlying the area and there is no swelling erythema, ascending cellulitis, drainage/purulence. Nails are discolored, dystrophic and slightly hypertrophic. No swelling erythema or drainage from the nail sites. No open lesions or pre-ulcerative lesions are identified. No overlying edema, erythema, increased warmth to bilateral lower extremities. MMT 5/5, ROM WNL No pain with calf compression, swelling, warmth, erythema.   Assessment: 24 year old female onychomycosis  Plan: -Nail biopsy results reviewed with the patient which revealed onychomycosis (Saprophytic fungi). Discussed various treatment options nail fungus. At this time the patient has elected proceed with topical treatment knowing that may not be the best option for this particular fungus. A prescription for [place] Mon was sent to our excrescence the patient was given information to follow-up at the pharmacy. Side effects the medication were discussed with the patient and directed to call the office if any are to occur. She would like to try the topical medication first, and if she does not notice any improvement in symptoms, will consider lamisil.  -Follow-up as needed. In the meantime, encouraged to call the office any questions, concerns, change in symptoms.

## 2014-04-15 NOTE — Progress Notes (Signed)
Discussed with patient

## 2014-06-10 ENCOUNTER — Ambulatory Visit (INDEPENDENT_AMBULATORY_CARE_PROVIDER_SITE_OTHER): Payer: 59 | Admitting: Certified Nurse Midwife

## 2014-06-10 ENCOUNTER — Encounter: Payer: Self-pay | Admitting: Certified Nurse Midwife

## 2014-06-10 VITALS — BP 120/70 | HR 68 | Resp 16 | Ht 68.75 in | Wt 157.0 lb

## 2014-06-10 DIAGNOSIS — B373 Candidiasis of vulva and vagina: Secondary | ICD-10-CM | POA: Diagnosis not present

## 2014-06-10 DIAGNOSIS — B3731 Acute candidiasis of vulva and vagina: Secondary | ICD-10-CM

## 2014-06-10 MED ORDER — NYSTATIN-TRIAMCINOLONE 100000-0.1 UNIT/GM-% EX CREA: 1.0000 "application " | TOPICAL_CREAM | Freq: Two times a day (BID) | CUTANEOUS | Status: DC

## 2014-06-10 NOTE — Patient Instructions (Signed)

## 2014-06-10 NOTE — Progress Notes (Signed)
24 y.o.Married white female g0p0 here with complaint of vaginal symptoms of itching, burning, and increase discharge. Describes discharge as small amount of white, no odor..Onset of symptoms 7 days ago. Denies new personal products .No STD concerns. Urinary symptoms none . Contraception is OCP. Patient has noted this is occuring after period has stopped. Patient took 2 Diflucan in the past week with some relief of discharge. No bleeding or spotting or pain with sexual activity.   O:Healthy female WDWN Affect: normal, orientation x 3  Exam: Abdomen: non tender Lymph node: no enlargement or tenderness Pelvic exam: External genital: normal female with scaling and cracking in folds of vulva with exudate noted. Wet prep taken BUS: negative Vagina: watery milky slight odorous discharge noted. Ph 4.0 Affirm taken Cervix: normal, non tender, no CMT Uterus: normal, non tender Adnexa:normal, non tender, no masses or fullness noted   Wet Prep results: positive yeast on vulva area   A:Normal pelvic exam Yeast vulvitis R/O vaginal BV/Yeast with affirm   P:Discussed findings of yeast vulvitis and etiology. Discussed Aveeno or baking soda sitz bath for comfort. Avoid moist clothes or pads for extended period of time. If working out in gym clothes or swim suits for long periods of time change underwear or bottoms of swimsuit if possible. Olive Oil/Coconut Oil use for skin protection prior to activity can be used to external skin. [id number] see order Await affirm results. Start on oral refrigerated probiotic to help immune system for prevention. Has used Boric acid capsules previously with no change.  Rv prn

## 2014-06-11 ENCOUNTER — Other Ambulatory Visit: Payer: Self-pay | Admitting: Certified Nurse Midwife

## 2014-06-11 DIAGNOSIS — B9689 Other specified bacterial agents as the cause of diseases classified elsewhere: Secondary | ICD-10-CM

## 2014-06-11 DIAGNOSIS — N760 Acute vaginitis: Principal | ICD-10-CM

## 2014-06-11 LAB — WET PREP BY MOLECULAR PROBE
Candida species: NEGATIVE
GARDNERELLA VAGINALIS: POSITIVE — AB
TRICHOMONAS VAG: NEGATIVE

## 2014-06-11 MED ORDER — METRONIDAZOLE 500 MG PO TABS: 500.0000 mg | ORAL_TABLET | Freq: Two times a day (BID) | ORAL | Status: DC

## 2014-06-12 NOTE — Progress Notes (Signed)
Reviewed personally.  M. Suzanne Rahn Lacuesta, MD.  

## 2014-07-01 ENCOUNTER — Ambulatory Visit (INDEPENDENT_AMBULATORY_CARE_PROVIDER_SITE_OTHER): Payer: 59 | Admitting: Certified Nurse Midwife

## 2014-07-01 ENCOUNTER — Encounter: Payer: Self-pay | Admitting: Certified Nurse Midwife

## 2014-07-01 VITALS — BP 110/72 | HR 68 | Resp 16 | Ht 68.75 in | Wt 157.0 lb

## 2014-07-01 DIAGNOSIS — N898 Other specified noninflammatory disorders of vagina: Secondary | ICD-10-CM

## 2014-07-01 NOTE — Progress Notes (Signed)
Reviewed personally.  M. Suzanne Joffrey Kerce, MD.  

## 2014-07-01 NOTE — Progress Notes (Signed)
24 y.o.Married white female g0p0 here for follow up of  Yeast vulvitis and pain with sexual activity at times. Treated with Mycolog cream with good results. Denies any vulva irritation now at all. Had recent episode of pain with sexual activity and irritated feeling again. Used Aveeno bath with some relief, but felt like this was deep in vagina. Seems to be resolved now. Denies any vaginal symptoms today.  O:Healthy female WDWN Affect: normal, orientation x 3  Exam: Abdomen: non tender Lymph node: no enlargement or tenderness Pelvic exam: External genital: normal female,no redness, scaling or lesions or tenderness BUS: negative Vagina: scant normal appearing physiological discharge noted.No point of pain identified on sweep of vaginal exam , Affirm taken Cervix: normal, non tender, no CMT Uterus: normal, non tender Adnexa:normal, non tender, no masses or fullness noted   A:Normal pelvic exam History of yeast vulvitis with sexual activity only, spouse no issues with.   P:Discussed findings of normal pelvic exam .Continue Aveeno or baking soda sitz bath for comfort. Avoid moist clothes or pads for extended period of time. If working out in gym clothes or swim suits for long periods of time change underwear or bottoms of swimsuit if possible.  Continue Coconut Oil use for skin protection prior to sexual activity and for lubrication if needed. Await affirm results and treat as indicated.  Rv prn

## 2014-07-02 ENCOUNTER — Other Ambulatory Visit: Payer: Self-pay | Admitting: Certified Nurse Midwife

## 2014-07-02 DIAGNOSIS — B9689 Other specified bacterial agents as the cause of diseases classified elsewhere: Secondary | ICD-10-CM

## 2014-07-02 DIAGNOSIS — N760 Acute vaginitis: Principal | ICD-10-CM

## 2014-07-02 LAB — WET PREP BY MOLECULAR PROBE
CANDIDA SPECIES: NEGATIVE
GARDNERELLA VAGINALIS: POSITIVE — AB
TRICHOMONAS VAG: NEGATIVE

## 2014-07-02 MED ORDER — CLINDAMYCIN PHOSPHATE 2 % VA CREA: TOPICAL_CREAM | VAGINAL | Status: DC

## 2014-07-22 ENCOUNTER — Telehealth: Payer: Self-pay | Admitting: Certified Nurse Midwife

## 2014-07-22 ENCOUNTER — Ambulatory Visit: Payer: 59 | Admitting: Certified Nurse Midwife

## 2014-07-22 NOTE — Telephone Encounter (Signed)
Left message on voicemail to reschedule reck appointment that she missed this afternoon.

## 2014-07-25 ENCOUNTER — Telehealth: Payer: Self-pay | Admitting: Nurse Practitioner

## 2014-07-25 NOTE — Telephone Encounter (Signed)
Spoke with patient. She has been doing a very low carb diet for the last two weeks and doing light aerobics. She is taking Loestrin 1.5/30 and has been for 3 years, reports regular cycles monthly.  Has not missed any pills. Usually starts cycle 5 days into placebo pills (On Tuesdays) and has not started a cycle this month. Patient is sexually active. Patient reports she is now eating a regular diet and has stopped low carb dieting.   Advised patient to take OTC home pregnancy test and return call to office if positive. Advised patient cycle may be irregular due to recent diet change and stress to body. Also could have decreased cycle with Loestrin. Advised to keep a bleeding calendar and to call back if no cycle in one month. Patient declines appointment at this time.   Advised patient Milford Cage, FNP with review message and would return call with any additional instructions. Patient is agreeable and advised to return call with any concerns or if would like office visit for evaluation.   Milford Cage, FNP agree to instructions?

## 2014-07-25 NOTE — Telephone Encounter (Signed)
Pt states she is having an 'amenorrhea bout'  Recently dieting and went off birth control to have her period but has not gotten it. States normally starts within 3 days but it has been 6.

## 2014-07-25 NOTE — Telephone Encounter (Signed)
Message left to return call to Tara White at [phone].    

## 2014-07-26 NOTE — Telephone Encounter (Signed)
Yes agreeable with instructions.

## 2014-08-16 ENCOUNTER — Encounter: Payer: Self-pay | Admitting: Nurse Practitioner

## 2014-08-16 ENCOUNTER — Ambulatory Visit (INDEPENDENT_AMBULATORY_CARE_PROVIDER_SITE_OTHER): Payer: 59 | Admitting: Nurse Practitioner

## 2014-08-16 VITALS — BP 120/82 | HR 64 | Ht 68.75 in | Wt 154.0 lb

## 2014-08-16 DIAGNOSIS — N760 Acute vaginitis: Secondary | ICD-10-CM

## 2014-08-16 MED ORDER — TINIDAZOLE 500 MG PO TABS: ORAL_TABLET | ORAL | Status: DC

## 2014-08-16 NOTE — Progress Notes (Signed)
24 y.o.Married white female G0P0 here with complaint of vaginal symptoms of itching, burning, and slight discharge. Describes discharge as white if there is any that she sees.  Main symptoms is painful intercourse.  For the past 3 times - the Affirm test is + for BV and has used Cleocin, Metrogel, and Diflucan.  Symptoms will go away for short periods of time and then back again. Onset of symptoms 2 days ago. Denies new personal products or vaginal dryness. No STD concerns. Urinary symptoms none . Contraception is OCP   O:Healthy female WDWN Affect: normal, orientation x 3  Exam: alert and in no distress Abdomen: soft and non tender Lymph node: no enlargement or tenderness Pelvic exam: External genital: normal female BUS: negative Vagina: thin white discharge noted. Affirm taken Cervix: normal, non tender, no CMT Uterus: normal, non tender Adnexa:normal, non tender, no masses or fullness noted   A: Recurrent BV   P: Discussed findings of vaginitis and etiology. Discussed Aveeno or baking soda sitz bath for comfort. Avoid moist clothes or pads for extended period of time. If working out in gym clothes or swim suits for long periods of time change underwear or bottoms of swimsuit if possible. Olive Oil/Coconut Oil use for skin protection prior to activity can be used to external skin.  Rx: use Tindamax 500 mg BID for a week  Will follow with Affirm test  May consider after Tindamax to take Diflucan weekly X 4  RV prn

## 2014-08-16 NOTE — Patient Instructions (Addendum)
Monilial Vaginitis Vaginitis in a soreness, swelling and redness (inflammation) of the vagina and vulva. Monilial vaginitis is not a sexually transmitted infection. CAUSES  Yeast vaginitis is caused by yeast (candida) that is normally found in your vagina. With a yeast infection, the candida has overgrown in number to a point that upsets the chemical balance. SYMPTOMS   White, thick vaginal discharge.  Swelling, itching, redness and irritation of the vagina and possibly the lips of the vagina (vulva).  Burning or painful urination.  Painful intercourse. DIAGNOSIS  Things that may contribute to monilial vaginitis are:  Postmenopausal and virginal states.  Pregnancy.  Infections.  Being tired, sick or stressed, especially if you had monilial vaginitis in the past.  Diabetes. Good control will help lower the chance.  Birth control pills.  Tight fitting garments.  Using bubble bath, feminine sprays, douches or deodorant tampons.  Taking certain medications that kill germs (antibiotics).  Sporadic recurrence can occur if you become ill. TREATMENT  Your caregiver will give you medication.  There are several kinds of anti monilial vaginal creams and suppositories specific for monilial vaginitis. For recurrent yeast infections, use a suppository or cream in the vagina 2 times a week, or as directed.  Anti-monilial or steroid cream for the itching or irritation of the vulva may also be used. Get your caregiver's permission.  Painting the vagina with methylene blue solution may help if the monilial cream does not work.  Eating yogurt may help prevent monilial vaginitis. HOME CARE INSTRUCTIONS   Finish all medication as prescribed.  Do not have sex until treatment is completed or after your caregiver tells you it is okay.  Take warm sitz baths.  Do not douche.  Do not use tampons, especially scented ones.  Wear cotton underwear.  Avoid tight pants and panty  hose.  Tell your sexual partner that you have a yeast infection. They should go to their caregiver if they have symptoms such as mild rash or itching.  Your sexual partner should be treated as well if your infection is difficult to eliminate.  Practice safer sex. Use condoms.  Some vaginal medications cause latex condoms to fail. Vaginal medications that harm condoms are:  Cleocin cream.  Butoconazole (Femstat).  Terconazole (Terazol) vaginal suppository.  Miconazole (Monistat) (may be purchased over the counter). SEEK MEDICAL CARE IF:   You have a temperature by mouth above 102 F (38.9 C).  The infection is getting worse after 2 days of treatment.  The infection is not getting better after 3 days of treatment.  You develop blisters in or around your vagina.  You develop vaginal bleeding, and it is not your menstrual period.  You have pain when you urinate.  You develop intestinal problems.  You have pain with sexual intercourse. Document Released: 12/23/2004 Document Revised: 06/07/2011 Document Reviewed: 09/06/2008 [facility] Patient Information 2015 [place], [place]. This information is not intended to replace advice given to you by your health care provider. Make sure you discuss any questions you have with your health care provider. Bacterial Vaginosis Bacterial vaginosis is a vaginal infection that occurs when the normal balance of bacteria in the vagina is disrupted. It results from an overgrowth of certain bacteria. This is the most common vaginal infection in women of childbearing age. Treatment is important to prevent complications, especially in pregnant women, as it can cause a premature delivery. CAUSES  Bacterial vaginosis is caused by an increase in harmful bacteria that are normally present in smaller amounts in the  vagina. Several different kinds of bacteria can cause bacterial vaginosis. However, the reason that the condition develops is not fully  understood. RISK FACTORS Certain activities or behaviors can put you at an increased risk of developing bacterial vaginosis, including:  Having a new sex partner or multiple sex partners.  Douching.  Using an intrauterine device (IUD) for contraception. Women do not get bacterial vaginosis from toilet seats, bedding, swimming pools, or contact with objects around them. SIGNS AND SYMPTOMS  Some women with bacterial vaginosis have no signs or symptoms. Common symptoms include:  Grey vaginal discharge.  A fishlike odor with discharge, especially after sexual intercourse.  Itching or burning of the vagina and vulva.  Burning or pain with urination. DIAGNOSIS  Your health care provider will take a medical history and examine the vagina for signs of bacterial vaginosis. A sample of vaginal fluid may be taken. Your health care provider will look at this sample under a microscope to check for bacteria and abnormal cells. A vaginal pH test may also be done.  TREATMENT  Bacterial vaginosis may be treated with antibiotic medicines. These may be given in the form of a pill or a vaginal cream. A second round of antibiotics may be prescribed if the condition comes back after treatment.  HOME CARE INSTRUCTIONS   Only take over-the-counter or prescription medicines as directed by your health care provider.  If antibiotic medicine was prescribed, take it as directed. Make sure you finish it even if you start to feel better.  Do not have sex until treatment is completed.  Tell all sexual partners that you have a vaginal infection. They should see their health care provider and be treated if they have problems, such as a mild rash or itching.  Practice safe sex by using condoms and only having one sex partner. SEEK MEDICAL CARE IF:   Your symptoms are not improving after 3 days of treatment.  You have increased discharge or pain.  You have a fever. MAKE SURE YOU:   Understand these  instructions.  Will watch your condition.  Will get help right away if you are not doing well or get worse. FOR MORE INFORMATION  Centers for Disease Control and Prevention, Division of STD Prevention: AppraiserFraud.fi American Sexual Health Association (ASHA): www.ashastd.org  Document Released: 03/15/2005 Document Revised: 01/03/2013 Document Reviewed: 10/25/2012 [facility] Patient Information 2015 [place], [place]. This information is not intended to replace advice given to you by your health care provider. Make sure you discuss any questions you have with your health care provider.

## 2014-08-17 LAB — WET PREP BY MOLECULAR PROBE
Candida species: NEGATIVE
Gardnerella vaginalis: POSITIVE — AB
TRICHOMONAS VAG: NEGATIVE

## 2014-08-18 NOTE — Progress Notes (Signed)
Encounter reviewed by Dr. Josefa Half. Consider Hylafem or Boric Acid vaginal treatment if symptoms persist.

## 2014-08-19 ENCOUNTER — Other Ambulatory Visit: Payer: Self-pay | Admitting: Nurse Practitioner

## 2014-08-19 MED ORDER — FLUCONAZOLE 150 MG PO TABS: ORAL_TABLET | ORAL | Status: DC

## 2014-08-29 ENCOUNTER — Telehealth: Payer: Self-pay | Admitting: Emergency Medicine

## 2014-08-29 ENCOUNTER — Encounter: Payer: Self-pay | Admitting: Nurse Practitioner

## 2014-08-29 NOTE — Telephone Encounter (Signed)
Patient states she is calling back to follow up conversation with Linus Orn. Patient ok for call back ASAP

## 2014-08-29 NOTE — Telephone Encounter (Signed)
Call to patient. She states that symptoms really never did improve after treatment with Tindamax and Diflucan. Felt some relief with nystatin cream, but not completely. She has been doing sitz oatmeal baths without relief. Patient states she has internal vaginal irritation that worsens with intercourse. Patient requests appointment for tomorrow. Scheduled with Regina Eck CNM as available. Patient agreeable. Routing to provider for final review. Patient agreeable to disposition. Will close encounter.   Cc Regina Eck CNM.

## 2014-08-29 NOTE — Telephone Encounter (Signed)
Telephone call for triage created to discuss message with patient and disposition as appropriate.   

## 2014-08-29 NOTE — Telephone Encounter (Signed)
Can she try Aveeno sitz bath daily until next Monday and see if this tissue just needs to heal - if then any pain at all needs OV.

## 2014-08-29 NOTE — Telephone Encounter (Signed)
Patty can you review and advise? Patient continues with pain during intercourse.

## 2014-08-29 NOTE — Telephone Encounter (Signed)
Chief Complaint  Patient presents with  . Advice Only    Patient sent mychart message for medical advice.     ----- Message -----    FromErasmo Downer    Sent: 08/29/2014 12:14 PM EDT      To: Milford Cage, FNP Subject: Non-Urgent Medical Question  I saw Kem Boroughs 2 weeks ago Friday.  I took my 5 days worth of antibiotic twice a day for bacterial vaginitis then have taken 3 fluconazole tablets to treat yeast.  1 week after beginning medication I had increased pain on the outside of the vagina and around the clitoris as well as burning in the vagina during intercourse.  I began to use the niastatin cream that I had on hand.  The outside pain and burning has dissipated greatly although not all the way but I am still experiencing burning in the vagina during intercourse. Should I come in again or can you suggest something? Thank you!

## 2014-08-29 NOTE — Telephone Encounter (Signed)
Message left to return call to Caretha Rumbaugh at [phone].    

## 2014-08-30 ENCOUNTER — Encounter: Payer: Self-pay | Admitting: Certified Nurse Midwife

## 2014-08-30 ENCOUNTER — Ambulatory Visit (INDEPENDENT_AMBULATORY_CARE_PROVIDER_SITE_OTHER): Payer: 59 | Admitting: Certified Nurse Midwife

## 2014-08-30 VITALS — BP 108/60 | HR 68 | Temp 98.7°F | Resp 18 | Ht 68.75 in | Wt 154.0 lb

## 2014-08-30 DIAGNOSIS — B373 Candidiasis of vulva and vagina: Secondary | ICD-10-CM

## 2014-08-30 DIAGNOSIS — R52 Pain, unspecified: Secondary | ICD-10-CM | POA: Diagnosis not present

## 2014-08-30 DIAGNOSIS — N898 Other specified noninflammatory disorders of vagina: Secondary | ICD-10-CM

## 2014-08-30 DIAGNOSIS — B3731 Acute candidiasis of vulva and vagina: Secondary | ICD-10-CM

## 2014-08-30 LAB — POCT URINALYSIS DIPSTICK
Bilirubin, UA: NEGATIVE
Blood, UA: NEGATIVE
Glucose, UA: NEGATIVE
Ketones, UA: NEGATIVE
NITRITE UA: NEGATIVE
PROTEIN UA: NEGATIVE
Urobilinogen, UA: NEGATIVE
pH, UA: 5

## 2014-08-30 NOTE — Progress Notes (Signed)
24 y.o.Married white female g0p0 here with complaint of vaginal symptoms of burning, and increase discharge. Describes discharge as  Normal. Patient feels this started with sexual activity again. Using coconut oil for moisturizer, without problems.  Onset of symptoms 4 days ago. Denies new personal products No vaginal dryness.  No STD concerns. Urinary symptoms none . Contraception is OCP.  O:Healthy female WDWN Affect: normal, orientation x 3  Exam: Abdomen:soft, non tender Lymph node: no enlargement or tenderness Pelvic exam: External genital: normal female, with slight redness , no scaling, wet prep taken BUS: negative Vagina: normal appearing discharge, small healing lsuperficial laceration noted noted. Ph:4.0  ,  Affirm taken Cervix: normal, non tender, no CMT Uterus: normal, non tender Adnexa:normal, non tender, no masses or fullness noted   Wet Prep results: vulva only positive for yeast   A:Normal pelvic exam Yeast vulvitis Vaginal inflammation poly medication use??   P:Discussed findings of yeast vulvitis and etiology. Discussed Aveeno or baking soda sitz bath for comfort. Avoid moist clothes or pads for extended period of time. If working out in gym clothes or swim suits for long periods of time change underwear or bottoms of swimsuit if possible. Coconut Oil use for skin protection prior to activity can be used to external skin. Patient has Nystatin cream rx and will restart use bid x 5 days. Discussed vaginal inflammation and concerned with overtreatment, so normal vaginal flora not present. Stop all other medications. Rx Hydrocortisone vaginal suppository 25 mg every hs x 5  Disp 5 . Rx given to patient to fill at [facility]  Once completed advise to status. Await affirm status.  Rv prn

## 2014-08-31 LAB — WET PREP BY MOLECULAR PROBE
CANDIDA SPECIES: NEGATIVE
Gardnerella vaginalis: POSITIVE — AB
TRICHOMONAS VAG: NEGATIVE

## 2014-09-02 ENCOUNTER — Telehealth: Payer: Self-pay | Admitting: Nurse Practitioner

## 2014-09-02 ENCOUNTER — Other Ambulatory Visit: Payer: Self-pay | Admitting: Certified Nurse Midwife

## 2014-09-02 DIAGNOSIS — B9689 Other specified bacterial agents as the cause of diseases classified elsewhere: Secondary | ICD-10-CM

## 2014-09-02 DIAGNOSIS — N760 Acute vaginitis: Principal | ICD-10-CM

## 2014-09-02 MED ORDER — HYLAFEM VA SUPP: 1.0000 | Freq: Every day | VAGINAL | Status: DC

## 2014-09-02 NOTE — Telephone Encounter (Signed)
Pharmacy states she's confused about medication for the patient. Pharmacy states the Rx says one thing but patient thought she was getting something else. Pharmacy ok for call back ask for [facility].

## 2014-09-02 NOTE — Progress Notes (Signed)
Reviewed personally.  M. Suzanne Cainan Trull, MD.  

## 2014-09-02 NOTE — Telephone Encounter (Signed)
Pt returned called and states she can be reached best until 1115 then anytime after 12pm.

## 2014-09-02 NOTE — Telephone Encounter (Signed)
Called patient an Message left to return call to [place] at [phone].   Patient has not picked up Hydrocortisone suppositories.   Spoke with [place] at [facility] She wanted to clarify that patient should be given the rectal suppositories, Anucort 25 mg to be used vaginally. Advised okay for vaginal suppositories.  They will work on prescription for patient to pick up.   Debbi can you review and advise regarding order for Hylafem. Patient has not started hydrocortisone vaginal suppositories

## 2014-09-02 NOTE — Telephone Encounter (Signed)
Patient's affirm was positive for BV and since she has not used suppositories, cancel vaginal hydrocortisone and do Rx Hylafem one suppository every hs x 5 order in. Please let patient know.

## 2014-09-02 NOTE — Telephone Encounter (Signed)
Call to patient. Advised of message below from provider regarding results.  Patient hesitant to use hydrocortisone suppositories if results showed BV. She would like to wait until Hylafem is available and use instead. She states she feels "a little bit better" at this time. Advised would send message for provider review and would return call. Patient agreeable.

## 2014-09-02 NOTE — Telephone Encounter (Signed)
-----   Message from Regina Eck, CNM sent at 09/02/2014  8:28 AM EDT ----- Notify patient that affirm showed no yeast or trichomonas, but was positive for BV again. Status now with suppository use? Would recommend use of Hylafem if her symptoms are better. Please advise

## 2014-09-02 NOTE — Telephone Encounter (Signed)
Noted. Encounter to be closed.

## 2014-09-02 NOTE — Telephone Encounter (Signed)
Please have her to return for TOC in 2 weeks.

## 2014-09-02 NOTE — Telephone Encounter (Signed)
yes

## 2014-09-02 NOTE — Telephone Encounter (Signed)
Call to patient. Gave message from provider. She is advised that will cancel vaginal hydrocortisone and then replace with Hylafem that will need to be ordered by [facility].  Patient agreeable. [facility] will contact her when available for pick up.  Patient states she will call back to schedule test of cure when picks up Hylafem and begins usage.  Called [facility] and cancelled prescription placed to [facility], order placed to [facility].   Debbi, okay for Hylafem to [facility] as ordered? Rx comes in boxes of 3 only.

## 2014-10-11 ENCOUNTER — Encounter: Payer: 59 | Admitting: Podiatry

## 2014-11-18 ENCOUNTER — Encounter: Payer: Self-pay | Admitting: Nurse Practitioner

## 2014-11-18 ENCOUNTER — Ambulatory Visit (INDEPENDENT_AMBULATORY_CARE_PROVIDER_SITE_OTHER): Payer: 59 | Admitting: Nurse Practitioner

## 2014-11-18 VITALS — BP 110/72 | HR 72 | Ht 68.75 in | Wt 155.2 lb

## 2014-11-18 DIAGNOSIS — N760 Acute vaginitis: Secondary | ICD-10-CM

## 2014-11-18 MED ORDER — TINIDAZOLE 500 MG PO TABS: 500.0000 mg | ORAL_TABLET | Freq: Two times a day (BID) | ORAL | Status: DC

## 2014-11-18 NOTE — Progress Notes (Signed)
24 y.o.MW Female G0 here with complaint of vaginal symptoms of itching, burning, and increase discharge. Describes discharge as white to yellow and irritative. Onset of symptoms after LMP on 8/15/1. Denies new personal products or vaginal dryness.  No STD concerns. Urinary symptoms none . Contraception is OCP.  She has been getting a lot of BV despite trying to stay dry.  In the past Affirm test hs been positive for BV and she has used Cleocin, Metrogel, and Tindamax.  She did not use Hylafem given at last visit since co pay was very high. She is frustrated and has used th probiotic for yeast w/o help.  She does get some relief wit Aveeno baths and will give her relief for a few days until her next cycle. She did take a Diflucan last pm.   O:Healthy female WDWN Affect: normal, orientation x 3  Exam: alert and in no distress Abdomen: soft and non  tender Lymph node: no enlargement or tenderness Pelvic exam: External genital: normal female BUS: negative Vagina: thin yellow discharge noted;, Affirm taken Cervix: normal, non tender, no CMT  Labs:  Affirm test   A: Vaginitis -  acute and chronic BV   P: Discussed findings of vaginitis and etiology. Discussed Aveeno or baking soda sitz bath for comfort. Avoid moist clothes or pads for extended period of time. If working out in gym clothes or swim suits for long periods of time change underwear or bottoms of swimsuit if possible. Olive Oil/Coconut Oil use for skin protection prior to activity can be used to external skin. Rx: she is given Tindamax this time and will not get med's until results are back.  RV prn

## 2014-11-19 LAB — WET PREP BY MOLECULAR PROBE
Candida species: NEGATIVE
Gardnerella vaginalis: POSITIVE — AB
Trichomonas vaginosis: NEGATIVE

## 2014-11-21 NOTE — Progress Notes (Signed)
Encounter reviewed by Dr. Mikisha Roseland Amundson C. Silva.  

## 2014-12-03 ENCOUNTER — Telehealth: Payer: Self-pay | Admitting: Nurse Practitioner

## 2014-12-03 NOTE — Telephone Encounter (Signed)
Added [place] to patients preferred list- eh

## 2014-12-03 NOTE — Telephone Encounter (Signed)
Patient calling to update her pharmacy information  [facility]/ [address] [place] [address] at [phone].

## 2014-12-04 ENCOUNTER — Telehealth: Payer: Self-pay | Admitting: Nurse Practitioner

## 2014-12-04 NOTE — Telephone Encounter (Signed)
Patient calling requesting assistance with transferring her Rx for Loestrin from her pharmacy on file to [facility] at: [phone]. She called [facility] said, "They told me the office had to help with this."

## 2014-12-04 NOTE — Telephone Encounter (Signed)
OK to refill at new pharmacy listed until November.

## 2014-12-04 NOTE — Telephone Encounter (Signed)
Medication refill request: OCP Last AEX:  01/28/14 PG Next AEX: ? Last MMG (if hormonal medication request): None Refill authorized: 01/28/14 #3packs/ 3R.   Patient would need new refills sent to pharmacy because she is out of refills. Today #3packs/0R to [place]?

## 2014-12-05 MED ORDER — NORETHINDRONE ACET-ETHINYL EST 1.5-30 MG-MCG PO TABS: 1.0000 | ORAL_TABLET | Freq: Every day | ORAL | Status: DC

## 2015-01-02 ENCOUNTER — Encounter: Payer: Self-pay | Admitting: Nurse Practitioner

## 2015-01-13 ENCOUNTER — Telehealth: Payer: Self-pay | Admitting: Nurse Practitioner

## 2015-01-13 NOTE — Telephone Encounter (Signed)
Spoke with patient. Patient states that she finished her last pack of OCP last Sunday 10/2 and did not start a new pack. Did have her cycle for one week. Has decided not to restart OCP and would like to try for pregnancy in the near future. Would like to discuss recommendations about what she needs to be doing. Advised can schedule an OV to discuss with Kem Boroughs, FNP. Patient is agreeable. Appointment scheduled for 01/24/2015 at 4 pm with Kem Boroughs, FNP. Agreeable to date and time.  Routing to provider for final review. Patient agreeable to disposition. Will close encounter.

## 2015-01-13 NOTE — Telephone Encounter (Signed)
Patient wants to come off of her BC would like to be educated on what to expect. Best contact [phone]

## 2015-01-14 NOTE — Telephone Encounter (Signed)
Left message to call [place] at [phone]. 

## 2015-01-14 NOTE — Telephone Encounter (Signed)
Kem Boroughs, FNP  Tara Awe, RN           Please give her a call about using BUM for birth control until she comes in for preconceptual counseling. Also bring her immunizations as we need to verify MMR II and TDaP before she even tries for a pregnancy.

## 2015-01-15 NOTE — Telephone Encounter (Signed)
Spoke with patient. Advised of message as seen below from Kem Boroughs, [place]. Patient is agreeable and verbalizes understanding.  Routing to provider for final review. Patient agreeable to disposition. Encounter previously closed.

## 2015-01-24 ENCOUNTER — Ambulatory Visit: Payer: 59 | Admitting: Nurse Practitioner

## 2015-01-24 ENCOUNTER — Encounter: Payer: Self-pay | Admitting: Nurse Practitioner

## 2015-01-24 VITALS — BP 130/70 | HR 80 | Ht 68.75 in | Wt 156.0 lb

## 2015-01-24 DIAGNOSIS — Z3189 Encounter for other procreative management: Secondary | ICD-10-CM | POA: Diagnosis not present

## 2015-01-24 NOTE — Patient Instructions (Signed)
Preparing for Pregnancy Before trying to become pregnant, make an appointment with your health care provider (preconception care). The goal is to help you have a healthy, safe pregnancy. At your first appointment, your health care provider will:   Do a complete physical exam, including a Pap test.  Take a complete medical history.  Give you advice and help you resolve any problems. PRECONCEPTION CHECKLIST Here is a list of the basics to cover with your health care provider at your preconception visit:  Medical history.  Tell your health care provider about any diseases you have had. Many diseases can affect your pregnancy.  Include your partner's medical history and family history.  Make sure you have been tested for sexually transmitted infections (STIs). These can affect your pregnancy. In some cases, they can be passed to your baby. Tell your health care provider about any history of STIs.  Make sure your health care provider knows about any previous problems you have had with conception or pregnancy.  Tell your health care provider about any medicine you take. This includes herbal supplements and over-the-counter medicines.  Make sure all your immunizations are up to date. You may need to make additional appointments.  Ask your health care provider if you need any vaccinations or if there are any you should avoid.  Diet.  It is especially important to eat a healthy, balanced diet with the right nutrients when you are pregnant.  Ask your health care provider to help you get to a healthy weight before pregnancy.  If you are overweight, you are at higher risk for certain complications. These include high blood pressure, diabetes, and preterm birth.  If you are underweight, you are more likely to have a low-birth-weight baby.  Lifestyle.  Tell your health care provider about lifestyle factors such as alcohol use, drug use, or smoking.  Describe any harmful substances you may  be exposed to at work or home. These can include chemicals, pesticides, and radiation.  Mental health.  Let your health care provider know if you have been feeling depressed or anxious.  Let your health care provider know if you have a history of substance abuse.  Let your health care provider know if you do not feel safe at home. HOME INSTRUCTIONS TO PREPARE FOR PREGNANCY Follow your health care provider's advice and instructions.   Keep an accurate record of your menstrual periods. This makes it easier for your health care provider to determine your baby's due date.  Begin taking prenatal vitamins and folic acid supplements daily. Take them as directed by your health care provider.  Eat a balanced diet. Get help from a nutrition counselor if you have questions or need help.  Get regular exercise. Try to be active for at least 30 minutes a day most days of the week.  Quit smoking, if you smoke.  Do not drink alcohol.  Do not take illegal drugs.  Get medical problems, such as diabetes or high blood pressure, under control.  If you have diabetes, make sure you do the following:  Have good blood sugar control. If you have type 1 diabetes, use multiple daily doses of insulin. Do not use split-dose or premixed insulin.  Have an eye exam by a qualified eye care professional trained in caring for people with diabetes.  Get evaluated by your health care provider for cardiovascular disease.  Get to a healthy weight. If you are overweight or obese, reduce your weight with the help of a qualified health   professional such as a Firefighter. Ask your health care provider what the right weight range is for you. HOW DO I KNOW I AM PREGNANT? You may be pregnant if you have been sexually active and you miss your period. Symptoms of early pregnancy include:   Mild cramping.  Very light vaginal bleeding (spotting).  Feeling unusually tired.  Morning sickness. If you have any of  these symptoms, take a home pregnancy test. These tests look for a hormone called human chorionic gonadotropin (hCG) in your urine. Your body begins to make this hormone during early pregnancy. These tests are very accurate. Wait until at least the first day you miss your period to take one. If you get a positive result, call your health care provider to make appointments for prenatal care. WHAT SHOULD I DO IF I BECOME PREGNANT?  Make an appointment with your health care provider by week 12 of your pregnancy at the latest.  Do not smoke. Smoking can be harmful to your baby.  Do not drink alcoholic beverages. Alcohol is related to a number of birth defects.  Avoid toxic odors and chemicals.  You may continue to have sexual intercourse if it does not cause pain or other problems, such as vaginal bleeding.   This information is not intended to replace advice given to you by your health care provider. Make sure you discuss any questions you have with your health care provider.   Document Released: 02/26/2008 Document Revised: 04/05/2014 Document Reviewed: 02/19/2013 Elsevier Interactive Patient Education [employer].

## 2015-01-24 NOTE — Progress Notes (Signed)
24 y.o.Married Caucasian  Female here for preconceptual counseling accompanied by her husband.  They want to plan a pregnancy in February 2017.  Gynecological History:    Menarche:age 55      LMP: 01/07/15 Length of cycle: 28 days  Length of Menses: 5 days GYN infectious disease history:  (Abnormal pap, venereal warts, herpes, or other STD's )No Current Birth Control method:condoms and natural planning Last time birth control was used: October 10/2014  PMH:  Any history of DM, HTN, epilepsy, Heart Murmur, or thyroid problems? No   If so, when did it begin? Are you or have you ever been anemic?No If so for how long? Have you ever had any accidents? No What type? Do you have any allergies? No Do you take any sedatives or tranquilizers? No Any domestic violence? No Any medications? No   (such as medications's for acne - certain medications can cause birth defects and Ace inhibitors can cause kidney problems in the fetus.)  Patient's Past Medical History:  Have you ever had surgery related to female organs? No Past pregnancies/ complications/ or miscarriages/ abortions? No ETOH? Yes social only Tobacco Use?No Drug use? No  Reviewed Medication list: Yes prenatal MVI Current job exposure risk - toxins/ Lead/ Mercury No Hot tub/ sauna use? No Do you commonly run long distance or do strenuous exercise? No Do you eat a strict vegetarian diet? No  Partners Past Medical History:  Have you ever had surgery related to female organs? No Previous Paternity? No Testicular Injury? No ETOH Yes social only Tobacco use: No Drug use No Current medications: Zantac prn Current job exposure risk toxins/ lead/ mercury No Hot/ tub sauna use? No Do you commonly run long distance or do strenuous exercise? No   Patient's Family Medical history: No Partners Family Medical History: No  Ethnic background? Mediterranean/ Asian/Chinese/ Ashkenazi Jews / [address] /  [place]   Patients [place]:      Partners [place]: Multiple Births No   Multiple Births No Genetic Disorders No   Genetic Disorders No  Sickle cell      Sickle Cell  Hemophilia      Hemophilia  Cystic Fibrosis     Cystic Fibrosis  Mental retardation     Mental retardation  Downs Syndrome     Downs Syndrome  Immunization Updates: Rubella Vaccine/ titer Yes both MMR was done as a child - she will return for a titer Chicken Pox / vaccination Yes Toxoplasmosis exposure (no changing litter box) No Tdap for pt. and partner Yes husband will check his records Hepatitis B (if at high risk) Yes Influenza vaccine who may get pregnant during the flu season No  Recommendations:   No ETOH / Tobacco / Drugs  Limit Caffeine  No Artificial Sweeteners  No raw beef  Restrict High fat foods  Limit servings of large fish (e.g., swordfish) to 1 X month  Foods associated with Listeria transmission ( e.g., sliced delicatessen meats &  Cheese)  No hot / tub Saunas  OTC med list  Counseling:   Normal pregnancy rates 80 % within 1 year  Rx. Prenatal Multivitamins  Discussion of timing of intercourse to ovulation  Labs: to return for rubella titer  Time spent with patient and husband:  30 minutes

## 2015-01-26 NOTE — Progress Notes (Signed)
Encounter reviewed by Dr. Brook Amundson C. Silva.  

## 2015-01-31 ENCOUNTER — Telehealth: Payer: Self-pay | Admitting: Nurse Practitioner

## 2015-01-31 MED ORDER — TINIDAZOLE 500 MG PO TABS: 500.0000 mg | ORAL_TABLET | Freq: Two times a day (BID) | ORAL | Status: DC

## 2015-01-31 NOTE — Telephone Encounter (Signed)
Spoke with patient. 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 Last had this is August 2016. States Kem Boroughs, FNP provided her a refill of Tindamax for if she had BV again. States she began to have symptoms yesterday. Called the pharmacy and the pharmacy filled a prescription for Clindamycin cream. "I know for sure the medication she gave me was an oral medication." Patient is requesting refill be sent in as Rx was discontinued before she could use the refill. Advised I will speak with Kem Boroughs, FNP and return call with further recommendations. Patient is agreeable.

## 2015-01-31 NOTE — Telephone Encounter (Signed)
Spoke with patient. Advised rx for Tindamax 500 mg BID #[id number] sent to [facility] on file. Patient is agreeable.  Routing to provider for final review. Patient agreeable to disposition. Will close encounter.

## 2015-01-31 NOTE — Telephone Encounter (Signed)
Patient has questions regarding medication for bacterial infection.

## 2015-01-31 NOTE — Telephone Encounter (Signed)
May have a refill on Tindamax

## 2015-02-11 ENCOUNTER — Ambulatory Visit (INDEPENDENT_AMBULATORY_CARE_PROVIDER_SITE_OTHER): Payer: 59 | Admitting: Nurse Practitioner

## 2015-02-11 ENCOUNTER — Encounter: Payer: Self-pay | Admitting: Nurse Practitioner

## 2015-02-11 VITALS — BP 114/76 | HR 60 | Ht 68.75 in | Wt 156.0 lb

## 2015-02-11 DIAGNOSIS — Z01419 Encounter for gynecological examination (general) (routine) without abnormal findings: Secondary | ICD-10-CM

## 2015-02-11 DIAGNOSIS — N760 Acute vaginitis: Secondary | ICD-10-CM | POA: Diagnosis not present

## 2015-02-11 DIAGNOSIS — Z0000 Encounter for general adult medical examination without abnormal findings: Secondary | ICD-10-CM

## 2015-02-11 LAB — HEMOGLOBIN, FINGERSTICK: HEMOGLOBIN, FINGERSTICK: 14 g/dL (ref 12.0–16.0)

## 2015-02-11 NOTE — Patient Instructions (Signed)
General topics  Next pap or exam is  due in 1 year Take a Women's multivitamin Take 1200 mg. of calcium daily - prefer dietary If any concerns in interim to call back  Breast Self-Awareness Practicing breast self-awareness may pick up problems early, prevent significant medical complications, and possibly save your life. By practicing breast self-awareness, you can become familiar with how your breasts look and feel and if your breasts are changing. This allows you to notice changes early. It can also offer you some reassurance that your breast health is good. One way to learn what is normal for your breasts and whether your breasts are changing is to do a breast self-exam. If you find a lump or something that was not present in the past, it is best to contact your caregiver right away. Other findings that should be evaluated by your caregiver include nipple discharge, especially if it is bloody; skin changes or reddening; areas where the skin seems to be pulled in (retracted); or new lumps and bumps. Breast pain is seldom associated with cancer (malignancy), but should also be evaluated by a caregiver. BREAST SELF-EXAM The best time to examine your breasts is 5 7 days after your menstrual period is over.  ExitCare Patient Information 2013 [employer]Care[employer].   Exercise to Stay Healthy Exercise helps you become and stay healthy. EXERCISE IDEAS AND TIPS Choose exercises that:  You enjoy.  Fit into your day. You do not need to exercise really hard to be healthy. You can do exercises at a slow or medium level and stay healthy. You can:  Stretch before and after working out.  Try yoga, Pilates, or tai chi.  Lift weights.  Walk fast, swim, jog, run, climb stairs, bicycle, dance, or rollerskate.  Take aerobic classes. Exercises that burn about 150 calories:  Running 1  miles in 15 minutes.  Playing volleyball for 45 to 60 minutes.  Washing and waxing a car for 45 to 60  minutes.  Playing touch football for 45 minutes.  Walking 1  miles in 35 minutes.  Pushing a stroller 1  miles in 30 minutes.  Playing basketball for 30 minutes.  Raking leaves for 30 minutes.  Bicycling 5 miles in 30 minutes.  Walking 2 miles in 30 minutes.  Dancing for 30 minutes.  Shoveling snow for 15 minutes.  Swimming laps for 20 minutes.  Walking up stairs for 15 minutes.  Bicycling 4 miles in 15 minutes.  Gardening for 30 to 45 minutes.  Jumping rope for 15 minutes.  Washing windows or floors for 45 to 60 minutes. Document Released: 04/17/2010 Document Revised: 06/07/2011 Document Reviewed: 04/17/2010 ExitCare Patient Information 2013 ExitCare[employer] LLC.   Other topics ( that may be useful information):    Sexually Transmitted Disease Sexually transmitted disease (STD) refers to any infection that is passed from person to person during sexual activity. This may happen by way of saliva, semen, blood, vaginal mucus, or urine. Common STDs include:  Gonorrhea.  Chlamydia.  Syphilis.  HIV/AIDS.  Genital herpes.  Hepatitis B and C.  Trichomonas.  Human papillomavirus (HPV).  Pubic lice. CAUSES  An STD may be spread by bacteria, virus, or parasite. A person can get an STD by:  Sexual intercourse with an infected person.  Sharing sex toys with an infected person.  Sharing needles with an infected person.  Having intimate contact with the genitals, mouth, or rectal areas of an infected person. SYMPTOMS  Some people may not have any symptoms, but   they can still pass the infection to others. Different STDs have different symptoms. Symptoms include:  Painful or bloody urination.  Pain in the pelvis, abdomen, vagina, anus, throat, or eyes.  Skin rash, itching, irritation, growths, or sores (lesions). These usually occur in the genital or anal area.  Abnormal vaginal discharge.  Penile discharge in men.  Soft, flesh-colored skin growths in the  genital or anal area.  Fever.  Pain or bleeding during sexual intercourse.  Swollen glands in the groin area.  Yellow skin and eyes (jaundice). This is seen with hepatitis. DIAGNOSIS  To make a diagnosis, your caregiver may:  Take a medical history.  Perform a physical exam.  Take a specimen (culture) to be examined.  Examine a sample of discharge under a microscope.  Perform blood test TREATMENT   Chlamydia, gonorrhea, trichomonas, and syphilis can be cured with antibiotic medicine.  Genital herpes, hepatitis, and HIV can be treated, but not cured, with prescribed medicines. The medicines will lessen the symptoms.  Genital warts from HPV can be treated with medicine or by freezing, burning (electrocautery), or surgery. Warts may come back.  HPV is a virus and cannot be cured with medicine or surgery.However, abnormal areas may be followed very closely by your caregiver and may be removed from the cervix, vagina, or vulva through office procedures or surgery. If your diagnosis is confirmed, your recent sexual partners need treatment. This is true even if they are symptom-free or have a negative culture or evaluation. They should not have sex until their caregiver says it is okay. HOME CARE INSTRUCTIONS  All sexual partners should be informed, tested, and treated for all STDs.  Take your antibiotics as directed. Finish them even if you start to feel better.  Only take over-the-counter or prescription medicines for pain, discomfort, or fever as directed by your caregiver.  Rest.  Eat a balanced diet and drink enough fluids to keep your urine clear or pale yellow.  Do not have sex until treatment is completed and you have followed up with your caregiver. STDs should be checked after treatment.  Keep all follow-up appointments, Pap tests, and blood tests as directed by your caregiver.  Only use latex condoms and water-soluble lubricants during sexual activity. Do not use  petroleum jelly or oils.  Avoid alcohol and illegal drugs.  Get vaccinated for HPV and hepatitis. If you have not received these vaccines in the past, talk to your caregiver about whether one or both might be right for you.  Avoid risky sex practices that can break the skin. The only way to avoid getting an STD is to avoid all sexual activity.Latex condoms and dental dams (for oral sex) will help lessen the risk of getting an STD, but will not completely eliminate the risk. SEEK MEDICAL CARE IF:   You have a fever.  You have any new or worsening symptoms. Document Released: 06/05/2002 Document Revised: 06/07/2011 Document Reviewed: 06/12/2010 [facility] Patient Information 2013 [employer].    Domestic Abuse You are being battered or abused if someone close to you hits, pushes, or physically hurts you in any way. You also are being abused if you are forced into activities. You are being sexually abused if you are forced to have sexual contact of any kind. You are being emotionally abused if you are made to feel worthless or if you are constantly threatened. It is important to remember that help is available. No one has the right to abuse you. PREVENTION OF FURTHER  ABUSE  Learn the warning signs of danger. This varies with situations but may include: the use of alcohol, threats, isolation from friends and family, or forced sexual contact. Leave if you feel that violence is going to occur.  If you are attacked or beaten, report it to the police so the abuse is documented. You do not have to press charges. The police can protect you while you or the attackers are leaving. Get the officer's name and badge number and a copy of the report.  Find someone you can trust and tell them what is happening to you: your caregiver, a nurse, clergy member, close friend or family member. Feeling ashamed is natural, but remember that you have done nothing wrong. No one deserves abuse. Document Released:  03/12/2000 Document Revised: 06/07/2011 Document Reviewed: 05/21/2010 ExitCare Patient Information 2013 ExitCare[employer].    How Much is Too Much Alcohol? Drinking too much alcohol can cause injury, accidents, and health problems. These types of problems can include:   Car crashes.  Falls.  Family fighting (domestic violence).  Drowning.  Fights.  Injuries.  Burns.  Damage to certain organs.  Having a baby with birth defects. ONE DRINK CAN BE TOO MUCH WHEN YOU ARE:  Working.  Pregnant or breastfeeding.  Taking medicines. Ask your doctor.  Driving or planning to drive. If you or someone you know has a drinking problem, get help from a doctor.  Document Released: 01/09/2009 Document Revised: 06/07/2011 Document Reviewed: 01/09/2009 ExitCare Patient Information 2013 ExitCare[employer].   Smoking Hazards Smoking cigarettes is extremely bad for your health. Tobacco smoke has over 200 known poisons in it. There are over 60 chemicals in tobacco smoke that cause cancer. Some of the chemicals found in cigarette smoke include:   Cyanide.  Benzene.  Formaldehyde.  Methanol (wood alcohol).  Acetylene (fuel used in welding torches).  Ammonia. Cigarette smoke also contains the poisonous gases nitrogen oxide and carbon monoxide.  Cigarette smokers have an increased risk of many serious medical problems and Smoking causes approximately:  90% of all lung cancer deaths in men.  80% of all lung cancer deaths in women.  90% of deaths from chronic obstructive lung disease. Compared with nonsmokers, smoking increases the risk of:  Coronary heart disease by 2 to 4 times.  Stroke by 2 to 4 times.  Men developing lung cancer by 23 times.  Women developing lung cancer by 13 times.  Dying from chronic obstructive lung diseases by 12 times.  . Smoking is the most preventable cause of death and disease in our society.  WHY IS SMOKING ADDICTIVE?  Nicotine is the chemical  agent in tobacco that is capable of causing addiction or dependence.  When you smoke and inhale, nicotine is absorbed rapidly into the bloodstream through your lungs. Nicotine absorbed through the lungs is capable of creating a powerful addiction. Both inhaled and non-inhaled nicotine may be addictive.  Addiction studies of cigarettes and spit tobacco show that addiction to nicotine occurs mainly during the teen years, when young people begin using tobacco products. WHAT ARE THE BENEFITS OF QUITTING?  There are many health benefits to quitting smoking.   Likelihood of developing cancer and heart disease decreases. Health improvements are seen almost immediately.  Blood pressure, pulse rate, and breathing patterns start returning to normal soon after quitting. QUITTING SMOKING   American Lung Association - 1-800-LUNGUSA  American Cancer Society - 1-800-ACS-2345 Document Released: 04/22/2004 Document Revised: 06/07/2011 Document Reviewed: 12/25/2008 ExitCare Patient Information 2013 ExitCare,   LLC.   Stress Management Stress is a state of physical or mental tension that often results from changes in your life or normal routine. Some common causes of stress are:  Death of a loved one.  Injuries or severe illnesses.  Getting fired or changing jobs.  Moving into a new home. Other causes may be:  Sexual problems.  Business or financial losses.  Taking on a large debt.  Regular conflict with someone at home or at work.  Constant tiredness from lack of sleep. It is not just bad things that are stressful. It may be stressful to:  Win the lottery.  Get married.  Buy a new car. The amount of stress that can be easily tolerated varies from person to person. Changes generally cause stress, regardless of the types of change. Too much stress can affect your health. It may lead to physical or emotional problems. Too little stress (boredom) may also become stressful. SUGGESTIONS TO  REDUCE STRESS:  Talk things over with your family and friends. It often is helpful to share your concerns and worries. If you feel your problem is serious, you may want to get help from a professional counselor.  Consider your problems one at a time instead of lumping them all together. Trying to take care of everything at once may seem impossible. List all the things you need to do and then start with the most important one. Set a goal to accomplish 2 or 3 things each day. If you expect to do too many in a single day you will naturally fail, causing you to feel even more stressed.  Do not use alcohol or drugs to relieve stress. Although you may feel better for a short time, they do not remove the problems that caused the stress. They can also be habit forming.  Exercise regularly - at least 3 times per week. Physical exercise can help to relieve that "uptight" feeling and will relax you.  The shortest distance between despair and hope is often a good night's sleep.  Go to bed and get up on time allowing yourself time for appointments without being rushed.  Take a short "time-out" period from any stressful situation that occurs during the day. Close your eyes and take some deep breaths. Starting with the muscles in your face, tense them, hold it for a few seconds, then relax. Repeat this with the muscles in your neck, shoulders, hand, stomach, back and legs.  Take good care of yourself. Eat a balanced diet and get plenty of rest.  Schedule time for having fun. Take a break from your daily routine to relax. HOME CARE INSTRUCTIONS   Call if you feel overwhelmed by your problems and feel you can no longer manage them on your own.  Return immediately if you feel like hurting yourself or someone else. Document Released: 09/08/2000 Document Revised: 06/07/2011 Document Reviewed: 05/01/2007 ExitCare Patient Information 2013 ExitCare, [employer].  

## 2015-02-11 NOTE — Progress Notes (Signed)
Patient ID: Tara White, female   DOB: [birth year], 24 y.o.   MRN: [id number] 24 y.o. G0P0000 Married  Caucasian Fe here for annual exam.  Some BV and took Tindamax and now yeast symptoms.and treated with Diflucan.  menses 5 days.  Will be trying for pregnancy. Husband is still going to check on TDaP.  Patient's last menstrual period was 02/04/2015 (exact date).          Sexually active: Yes.    The current method of family planning is rhythm method and coitus interruptus.    Exercising: Yes.    light cardio and weight bearing.  Teaches fitness to seniors, gets light exercise everyday. Smoker:  no  Health Maintenance: Pap: 10/26/11, WNL TDaP: 2009 Gardasil completed: 10/26/11 Labs: HB: 14.0  Urine:  Negative   reports that she has never smoked. She has never used smokeless tobacco. She reports that she drinks about 1.2 oz of alcohol per week. She reports that she does not use illicit drugs.  History reviewed. No pertinent past medical history.  Past Surgical History  Procedure Laterality Date  . Wisdom tooth extraction      Current Outpatient Prescriptions  Medication Sig Dispense Refill  . Prenatal Vit-Fe Fumarate-FA (PRENATAL MULTIVITAMIN) TABS tablet Take 1 tablet by mouth daily at 12 noon.     No current facility-administered medications for this visit.    Family History  Problem Relation Age of Onset  . Hypothyroidism Mother   . Heart failure Paternal Grandfather     ROS:  Pertinent items are noted in HPI.  Otherwise, a comprehensive ROS was negative.  Exam:   BP 114/76 mmHg  Pulse 60  Ht 5' 8.75" (1.746 m)  Wt 156 lb (70.761 kg)  BMI 23.21 kg/m2  LMP 02/04/2015 (Exact Date) Height: 5' 8.75" (174.6 cm) Ht Readings from Last 3 Encounters:  02/11/15 5' 8.75" (1.746 m)  01/24/15 5' 8.75" (1.746 m)  11/18/14 5' 8.75" (1.746 m)    General appearance: alert, cooperative and appears stated age Head: Normocephalic, without obvious abnormality, atraumatic Neck: no  adenopathy, supple, symmetrical, trachea midline and thyroid normal to inspection and palpation Lungs: clear to auscultation bilaterally Breasts: normal appearance, no masses or tenderness Heart: regular rate and rhythm Abdomen: soft, non-tender; no masses,  no organomegaly Extremities: extremities normal, atraumatic, no cyanosis or edema Skin: Skin color, texture, turgor normal. No rashes or lesions Lymph nodes: Cervical, supraclavicular, and axillary nodes normal. No abnormal inguinal nodes palpated Neurologic: Grossly normal   Pelvic: External genitalia:  no lesions bu redness with linear marks consistent with yeast              Urethra:  normal appearing urethra with no masses, tenderness or lesions              Bartholin's and Skene's: normal                 Vagina: normal appearing vagina with normal color and thin discharge, no lesions              Cervix: anteverted              Pap taken: Yes.   Bimanual Exam:  Uterus:  normal size, contour, position, consistency, mobility, non-tender              Adnexa: no mass, fullness, tenderness               Rectovaginal: Confirms  Anus:  normal sphincter tone, no lesions  Chaperone present: yes  A:  Well Woman with normal exam  Planning a pregnancy soon History of menorrhagia History of chronic yeast, recent treatment for BV   P:   Reviewed health and wellness pertinent to exam  Pap smear as above  Will get Rubella titer and follow  Will follow up with [facility]  Return annually or prn, information about preconceptual counseling already done with husband.  An After Visit Summary was printed and given to the patient.

## 2015-02-12 ENCOUNTER — Other Ambulatory Visit: Payer: Self-pay | Admitting: Certified Nurse Midwife

## 2015-02-12 LAB — POCT URINALYSIS DIPSTICK
Bilirubin, UA: NEGATIVE
Glucose, UA: NEGATIVE
Ketones, UA: NEGATIVE
Leukocytes, UA: NEGATIVE
Nitrite, UA: NEGATIVE
PROTEIN UA: NEGATIVE
RBC UA: NEGATIVE
Urobilinogen, UA: NEGATIVE
pH, UA: 6

## 2015-02-12 LAB — RUBELLA SCREEN: Rubella: 1.05 Index — ABNORMAL HIGH (ref <0.90)

## 2015-02-12 LAB — WET PREP BY MOLECULAR PROBE
Candida species: NEGATIVE
GARDNERELLA VAGINALIS: POSITIVE — AB
Trichomonas vaginosis: NEGATIVE

## 2015-02-13 ENCOUNTER — Other Ambulatory Visit: Payer: Self-pay | Admitting: Certified Nurse Midwife

## 2015-02-13 DIAGNOSIS — N760 Acute vaginitis: Principal | ICD-10-CM

## 2015-02-13 DIAGNOSIS — B9689 Other specified bacterial agents as the cause of diseases classified elsewhere: Secondary | ICD-10-CM

## 2015-02-13 MED ORDER — TINIDAZOLE 500 MG PO TABS: ORAL_TABLET | ORAL | Status: DC

## 2015-02-14 ENCOUNTER — Ambulatory Visit: Payer: 59 | Admitting: Nurse Practitioner

## 2015-02-14 LAB — IPS PAP TEST WITH REFLEX TO HPV

## 2015-02-16 NOTE — Progress Notes (Signed)
Encounter reviewed by Dr. Brook Amundson C. Silva.  

## 2015-03-26 ENCOUNTER — Ambulatory Visit (INDEPENDENT_AMBULATORY_CARE_PROVIDER_SITE_OTHER): Payer: 59 | Admitting: Nurse Practitioner

## 2015-03-26 ENCOUNTER — Telehealth: Payer: Self-pay

## 2015-03-26 ENCOUNTER — Encounter: Payer: Self-pay | Admitting: Nurse Practitioner

## 2015-03-26 VITALS — BP 126/72 | HR 64 | Ht 68.75 in | Wt 158.0 lb

## 2015-03-26 DIAGNOSIS — N760 Acute vaginitis: Secondary | ICD-10-CM

## 2015-03-26 MED ORDER — TINIDAZOLE 500 MG PO TABS: ORAL_TABLET | ORAL | Status: DC

## 2015-03-26 MED ORDER — NYSTATIN-TRIAMCINOLONE 100000-0.1 UNIT/GM-% EX OINT: 1.0000 "application " | TOPICAL_OINTMENT | Freq: Two times a day (BID) | CUTANEOUS | Status: DC

## 2015-03-26 NOTE — Progress Notes (Signed)
Patient ID: Tara White, female   DOB: May 02, 1990, 24 y.o.   MRN: [id number]  24 y.o. Married Caucasian female G0P0000 here with complaint of vaginal symptoms of itching, burning, and increase discharge. Describes discharge as feels irritated externally. Onset of symptoms one days ago. Denies new personal products or vaginal dryness. No STD concerns. Urinary symptoms none . Contraception is natural family. LMP: 03/05/2015.  She normally does well on Tindamax.  She has failed with Hylafem in the past.  O:  Healthy female WDWN Affect: normal, orientation x 3  Exam: alert and no distress Abdomen: soft and non tender Lymph node: no enlargement or tenderness Pelvic exam: External genital: normal female with redness and white discharge BUS: negative Vagina: clear to white discharge noted.  Affirm taken. Cervix: normal, non tender, no CMT  Labs:  [facility]irm   A: Vaginitis  History of chronic BV   P: Discussed findings of vaginitis and etiology. Discussed Aveeno or baking soda sitz bath for comfort. Avoid moist clothes or pads for extended period of time. If working out in gym clothes or swim suits for long periods of time change underwear or bottoms of swimsuit if possible. Olive Oil/Coconut Oil use for skin protection prior to activity can be used to external skin.  Rx: will give her a refill on Nystatin  Will go ahead and start her on Tindamax for pt and treat husband this time - she is given ETOH and GI caution.  Follow with [facility]  RV prn

## 2015-03-26 NOTE — Telephone Encounter (Signed)
No callback from patient. Pt kept 4:00 appt with PG

## 2015-03-26 NOTE — Progress Notes (Signed)
Encounter reviewed Cynthea Zachman, MD   

## 2015-03-26 NOTE — Patient Instructions (Signed)
meds sent to pharmacy

## 2015-03-26 NOTE — Telephone Encounter (Signed)
Per PG, patient called & asked if she wanted to come in earlier & see Melvia Heaps.  LMTCB if pt would like to do that.

## 2015-03-27 ENCOUNTER — Other Ambulatory Visit: Payer: Self-pay | Admitting: Nurse Practitioner

## 2015-03-27 LAB — WET PREP BY MOLECULAR PROBE
CANDIDA SPECIES: POSITIVE — AB
GARDNERELLA VAGINALIS: POSITIVE — AB
TRICHOMONAS VAG: NEGATIVE

## 2015-03-27 MED ORDER — FLUCONAZOLE 150 MG PO TABS: 150.0000 mg | ORAL_TABLET | Freq: Once | ORAL | Status: DC

## 2015-04-02 ENCOUNTER — Encounter: Payer: Self-pay | Admitting: Nurse Practitioner

## 2015-04-07 ENCOUNTER — Encounter: Payer: Self-pay | Admitting: Nurse Practitioner

## 2015-04-07 ENCOUNTER — Telehealth: Payer: Self-pay | Admitting: Emergency Medicine

## 2015-04-07 NOTE — Telephone Encounter (Signed)
Chief Complaint  Patient presents with  . Advice Only    ===View-only below this line===   ----- Message -----    FromErasmo Downer    Sent: 04/07/2015  8:31 AM EST      To: ROLEN-GRUBB, PATRICIA, FNP Subject: Non-Urgent Medical Question  Hello, I am doing much better with regards to the yeast/bacteria; however, my period is now 5 days late and I am wondering if any of the medication prescribed ever influences this.  I started tracking my fertility 3 months ago and this is the first time I've seen this - AND we are hoping to conceive my next cycle - so I was especially concerned and attempting to find the cause if there is one.  Thank you. Kiana

## 2015-04-07 NOTE — Telephone Encounter (Signed)
Spoke with patient. She c/o of 5 days late for cycle. Not trying to prevent pregnancy. Plans to actively begin trying to conceive next month. Negative home pregnancy testing two days ago.  No abdominal pain or any other concerns.  Patient has not made any changes to diet or exercise. Denies increased stressors.  Advised patient to continue to monitor. Retest urine pregnancy test if no cycle two weeks from last intercourse.  Advised patient to call back if does not start a cycle or with any new concerns or abdominal pain/heavy bleeding.   Patty, okay to close?

## 2015-04-07 NOTE — Telephone Encounter (Signed)
Telephone call for triage created to discuss message with patient and disposition as appropriate.   

## 2015-05-19 ENCOUNTER — Telehealth: Payer: Self-pay | Admitting: Nurse Practitioner

## 2015-05-19 ENCOUNTER — Encounter: Payer: Self-pay | Admitting: Nurse Practitioner

## 2015-05-19 ENCOUNTER — Ambulatory Visit (INDEPENDENT_AMBULATORY_CARE_PROVIDER_SITE_OTHER): Payer: 59 | Admitting: Nurse Practitioner

## 2015-05-19 VITALS — BP 122/70 | HR 70 | Resp 16 | Ht 68.75 in | Wt 156.0 lb

## 2015-05-19 DIAGNOSIS — N912 Amenorrhea, unspecified: Secondary | ICD-10-CM | POA: Diagnosis not present

## 2015-05-19 LAB — POCT URINE PREGNANCY: Preg Test, Ur: POSITIVE — AB

## 2015-05-19 NOTE — Telephone Encounter (Signed)
Left patient a message to call when she is ready to schedule an appointment. Patient left a message over the weekend that she would like to confirm pregnancy.

## 2015-05-19 NOTE — Progress Notes (Signed)
25 y.o. Married Caucasian female G0P0000 here for consult visit for positive UPT at home. LMP: 04/04/15.  This is her first month at trying for a pregnancy.  She is on prenatal MVI.  Denies any symptoms of vaginal bleeding or cramps. She has checked with group in [place] with a Nurse Midwife group and is leaning toward going with them for her OB care.   O: Healthy WD,WN female  Very happy with pregnancy findings UPT: positive   A: Amenorrhea with + UPT    P:  Discussed that we will place order for PUS and will verify coverage with insurance and get scheduled for next week.  She is given signs and symptoms to worry about such as pain and bleeding   Labs UPT only  Instructions given regarding: first trimester pregnancy  RV Consult time:  15 minutes

## 2015-05-19 NOTE — Patient Instructions (Signed)
First Trimester of Pregnancy The first trimester of pregnancy is from week 1 until the end of week 12 (months 1 through 3). A week after a sperm fertilizes an egg, the egg will implant on the wall of the uterus. This embryo will begin to develop into a baby. Genes from you and your partner are forming the baby. The female genes determine whether the baby is a boy or a girl. At 6-8 weeks, the eyes and face are formed, and the heartbeat can be seen on ultrasound. At the end of 12 weeks, all the baby's organs are formed.  Now that you are pregnant, you will want to do everything you can to have a healthy baby. Two of the most important things are to get good prenatal care and to follow your health care provider's instructions. Prenatal care is all the medical care you receive before the baby's birth. This care will help prevent, find, and treat any problems during the pregnancy and childbirth. BODY CHANGES Your body goes through many changes during pregnancy. The changes vary from woman to woman.   You may gain or lose a couple of pounds at first.  You may feel sick to your stomach (nauseous) and throw up (vomit). If the vomiting is uncontrollable, call your health care provider.  You may tire easily.  You may develop headaches that can be relieved by medicines approved by your health care provider.  You may urinate more often. Painful urination may mean you have a bladder infection.  You may develop heartburn as a result of your pregnancy.  You may develop constipation because certain hormones are causing the muscles that push waste through your intestines to slow down.  You may develop hemorrhoids or swollen, bulging veins (varicose veins).  Your breasts may begin to grow larger and become tender. Your nipples may stick out more, and the tissue that surrounds them (areola) may become darker.  Your gums may bleed and may be sensitive to brushing and flossing.  Dark spots or blotches (chloasma,  mask of pregnancy) may develop on your face. This will likely fade after the baby is born.  Your menstrual periods will stop.  You may have a loss of appetite.  You may develop cravings for certain kinds of food.  You may have changes in your emotions from day to day, such as being excited to be pregnant or being concerned that something may go wrong with the pregnancy and baby.  You may have more vivid and strange dreams.  You may have changes in your hair. These can include thickening of your hair, rapid growth, and changes in texture. Some women also have hair loss during or after pregnancy, or hair that feels dry or thin. Your hair will most likely return to normal after your baby is born. WHAT TO EXPECT AT YOUR PRENATAL VISITS During a routine prenatal visit:  You will be weighed to make sure you and the baby are growing normally.  Your blood pressure will be taken.  Your abdomen will be measured to track your baby's growth.  The fetal heartbeat will be listened to starting around week 10 or 12 of your pregnancy.  Test results from any previous visits will be discussed. Your health care provider may ask you:  How you are feeling.  If you are feeling the baby move.  If you have had any abnormal symptoms, such as leaking fluid, bleeding, severe headaches, or abdominal cramping.  If you are using any tobacco products,   including cigarettes, chewing tobacco, and electronic cigarettes.  If you have any questions. Other tests that may be performed during your first trimester include:  Blood tests to find your blood type and to check for the presence of any previous infections. They will also be used to check for low iron levels (anemia) and Rh antibodies. Later in the pregnancy, blood tests for diabetes will be done along with other tests if problems develop.  Urine tests to check for infections, diabetes, or protein in the urine.  An ultrasound to confirm the proper growth  and development of the baby.  An amniocentesis to check for possible genetic problems.  Fetal screens for spina bifida and Down syndrome.  You may need other tests to make sure you and the baby are doing well.  HIV (human immunodeficiency virus) testing. Routine prenatal testing includes screening for HIV, unless you choose not to have this test. HOME CARE INSTRUCTIONS  Medicines  Follow your health care provider's instructions regarding medicine use. Specific medicines may be either safe or unsafe to take during pregnancy.  Take your prenatal vitamins as directed.  If you develop constipation, try taking a stool softener if your health care provider approves. Diet  Eat regular, well-balanced meals. Choose a variety of foods, such as meat or vegetable-based protein, fish, milk and low-fat dairy products, vegetables, fruits, and whole grain breads and cereals. Your health care provider will help you determine the amount of weight gain that is right for you.  Avoid raw meat and uncooked cheese. These carry germs that can cause birth defects in the baby.  Eating four or five small meals rather than three large meals a day may help relieve nausea and vomiting. If you start to feel nauseous, eating a few soda crackers can be helpful. Drinking liquids between meals instead of during meals also seems to help nausea and vomiting.  If you develop constipation, eat more high-fiber foods, such as fresh vegetables or fruit and whole grains. Drink enough fluids to keep your urine clear or pale yellow. Activity and Exercise  Exercise only as directed by your health care provider. Exercising will help you:  Control your weight.  Stay in shape.  Be prepared for labor and delivery.  Experiencing pain or cramping in the lower abdomen or low back is a good sign that you should stop exercising. Check with your health care provider before continuing normal exercises.  Try to avoid standing for long  periods of time. Move your legs often if you must stand in one place for a long time.  Avoid heavy lifting.  Wear low-heeled shoes, and practice good posture.  You may continue to have sex unless your health care provider directs you otherwise. Relief of Pain or Discomfort  Wear a good support bra for breast tenderness.   Take warm sitz baths to soothe any pain or discomfort caused by hemorrhoids. Use hemorrhoid cream if your health care provider approves.   Rest with your legs elevated if you have leg cramps or low back pain.  If you develop varicose veins in your legs, wear support hose. Elevate your feet for 15 minutes, 3-4 times a day. Limit salt in your diet. Prenatal Care  Schedule your prenatal visits by the twelfth week of pregnancy. They are usually scheduled monthly at first, then more often in the last 2 months before delivery.  Write down your questions. Take them to your prenatal visits.  Keep all your prenatal visits as directed by your   health care provider. Safety  Wear your seat belt at all times when driving.  Make a list of emergency phone numbers, including numbers for family, friends, the hospital, and police and fire departments. General Tips  Ask your health care provider for a referral to a local prenatal education class. Begin classes no later than at the beginning of month 6 of your pregnancy.  Ask for help if you have counseling or nutritional needs during pregnancy. Your health care provider can offer advice or refer you to specialists for help with various needs.  Do not use hot tubs, steam rooms, or saunas.  Do not douche or use tampons or scented sanitary pads.  Do not cross your legs for long periods of time.  Avoid cat litter boxes and soil used by cats. These carry germs that can cause birth defects in the baby and possibly loss of the fetus by miscarriage or stillbirth.  Avoid all smoking, herbs, alcohol, and medicines not prescribed by  your health care provider. Chemicals in these affect the formation and growth of the baby.  Do not use any tobacco products, including cigarettes, chewing tobacco, and electronic cigarettes. If you need help quitting, ask your health care provider. You may receive counseling support and other resources to help you quit.  Schedule a dentist appointment. At home, brush your teeth with a soft toothbrush and be gentle when you floss. SEEK MEDICAL CARE IF:   You have dizziness.  You have mild pelvic cramps, pelvic pressure, or nagging pain in the abdominal area.  You have persistent nausea, vomiting, or diarrhea.  You have a bad smelling vaginal discharge.  You have pain with urination.  You notice increased swelling in your face, hands, legs, or ankles. SEEK IMMEDIATE MEDICAL CARE IF:   You have a fever.  You are leaking fluid from your vagina.  You have spotting or bleeding from your vagina.  You have severe abdominal cramping or pain.  You have rapid weight gain or loss.  You vomit blood or material that looks like coffee grounds.  You are exposed to German measles and have never had them.  You are exposed to fifth disease or chickenpox.  You develop a severe headache.  You have shortness of breath.  You have any kind of trauma, such as from a fall or a car accident.   This information is not intended to replace advice given to you by your health care provider. Make sure you discuss any questions you have with your health care provider.   Document Released: 03/09/2001 Document Revised: 04/05/2014 Document Reviewed: 01/23/2013 Elsevier Interactive Patient Education 2016 [employer]  

## 2015-05-20 ENCOUNTER — Telehealth: Payer: Self-pay | Admitting: Nurse Practitioner

## 2015-05-20 NOTE — Telephone Encounter (Signed)
Left message to call Adaijah Endres at [phone]. 

## 2015-05-20 NOTE — Telephone Encounter (Signed)
Pt returned call to scheduled ultrasound.  She wanted to advise that she was a week off when calculated LMP. In view of this information, patient requested to schedule ultrasound the week of 06/05/15, instead of 05/29/15. Forward to clinical triage to verify if scheduled date is acceptable.

## 2015-05-20 NOTE — Telephone Encounter (Signed)
Spoke with patient. Patient states that she told Kem Boroughs, FNP that her LMP was 04/04/15, but upon review of her calendar LMP was 04/11/2015. She is scheduled for viability ultrasound in the office for 06/05/2015 with Dr.Silva. Advised okay to keep this appointment as scheduled. Advised I will notify Dr.Silva of the date of her LMP so that she has this for her upcoming appointment. She is agreeable.  Routing to provider for final review. Patient agreeable to disposition. Will close encounter.

## 2015-05-20 NOTE — Telephone Encounter (Signed)
Called patient to review benefits for a recommended procedure. Left Voicemail requesting a call back. °

## 2015-05-20 NOTE — Telephone Encounter (Signed)
Spoke with pt regarding benefit for ultrasound. Patient understood and agreeable. Patient ready to schedule. Patient scheduled 06/05/15 with Dr Quincy Simmonds. Pt aware of arrival date and time. Pt aware of 72 hours cancellation policy with [id number] fee. No further questions. Ok to close

## 2015-05-21 NOTE — Progress Notes (Signed)
Encounter reviewed by Dr. Brook Amundson C. Silva.  

## 2015-06-05 ENCOUNTER — Encounter: Payer: Self-pay | Admitting: Obstetrics and Gynecology

## 2015-06-05 ENCOUNTER — Ambulatory Visit (INDEPENDENT_AMBULATORY_CARE_PROVIDER_SITE_OTHER): Payer: 59 | Admitting: Obstetrics and Gynecology

## 2015-06-05 ENCOUNTER — Ambulatory Visit (INDEPENDENT_AMBULATORY_CARE_PROVIDER_SITE_OTHER): Payer: 59

## 2015-06-05 ENCOUNTER — Other Ambulatory Visit: Payer: Self-pay | Admitting: Obstetrics and Gynecology

## 2015-06-05 VITALS — BP 114/62 | HR 74 | Resp 14 | Ht 66.75 in | Wt 157.0 lb

## 2015-06-05 DIAGNOSIS — N912 Amenorrhea, unspecified: Secondary | ICD-10-CM

## 2015-06-05 DIAGNOSIS — Z3490 Encounter for supervision of normal pregnancy, unspecified, unspecified trimester: Secondary | ICD-10-CM

## 2015-06-05 NOTE — Progress Notes (Signed)
Subjective  25 y.o. G66P0000 Married Caucasian female here for pelvic ultrasound for early pregnancy and fetal viability.   Patient's last menstrual period was 04/12/2015 (exact date).   Husband here for the visit today.   Some nausea.  Eating OK.  Tolerating PNV.  Objective    Pelvic ultrasound images and report reviewed. Uterus - Viable IUP, 7 + 5 weeks.  FH 160. Yolk sac seen.  EMS - NA. Ovaries - right CL cyst - 27 mm. Free fluid - no     Assessment  Viable IUP.  Size equals dates.   Plan  Reassurance given regarding ultrasound findings.  PNV.  Discussed avoidance of exposures - tobacco, ETOH, unnecessary medications, uncooked foods, overheating.  Discussed reading resources - What to Expect When Expecting Pregnancy, Pregnancy Week by Week.  Will establish care with OB group.  Strongly considering a group in [place].  Return to care here after postpartum visit.  Our congratulations!  __15_____ minutes face to face time of which over 50% was spent in counseling.   After visit summary to patient.

## 2015-06-05 NOTE — Telephone Encounter (Signed)
Will close encounter. Pt seen in office by Dr. Quincy Simmonds today.

## 2015-07-14 LAB — OB RESULTS CONSOLE ABO/RH: RH TYPE: POSITIVE

## 2015-07-14 LAB — OB RESULTS CONSOLE RUBELLA ANTIBODY, IGM: Rubella: UNDETERMINED

## 2015-07-14 LAB — OB RESULTS CONSOLE RPR: RPR: NONREACTIVE

## 2015-07-14 LAB — OB RESULTS CONSOLE HEPATITIS B SURFACE ANTIGEN: HEP B S AG: NEGATIVE

## 2015-07-14 LAB — OB RESULTS CONSOLE HIV ANTIBODY (ROUTINE TESTING): HIV: NONREACTIVE

## 2015-07-14 LAB — OB RESULTS CONSOLE ANTIBODY SCREEN: ANTIBODY SCREEN: NEGATIVE

## 2015-07-14 LAB — OB RESULTS CONSOLE GC/CHLAMYDIA
Chlamydia: NEGATIVE
Gonorrhea: NEGATIVE

## 2015-12-09 ENCOUNTER — Encounter (HOSPITAL_COMMUNITY): Payer: Self-pay | Admitting: *Deleted

## 2015-12-09 ENCOUNTER — Inpatient Hospital Stay (HOSPITAL_COMMUNITY): Payer: Managed Care, Other (non HMO)

## 2015-12-09 ENCOUNTER — Inpatient Hospital Stay (HOSPITAL_COMMUNITY)
Admission: AD | Admit: 2015-12-09 | Discharge: 2015-12-13 | DRG: 766 | Disposition: A | Discharge status: Home / Self Care | Payer: Managed Care, Other (non HMO) | Source: Ambulatory Visit | Attending: Obstetrics | Admitting: Obstetrics

## 2015-12-09 DIAGNOSIS — Z3A34 34 weeks gestation of pregnancy: Secondary | ICD-10-CM

## 2015-12-09 DIAGNOSIS — Z1389 Encounter for screening for other disorder: Secondary | ICD-10-CM

## 2015-12-09 DIAGNOSIS — Z3A35 35 weeks gestation of pregnancy: Secondary | ICD-10-CM

## 2015-12-09 DIAGNOSIS — O1414 Severe pre-eclampsia complicating childbirth: Principal | ICD-10-CM | POA: Diagnosis present

## 2015-12-09 DIAGNOSIS — Z363 Encounter for antenatal screening for malformations: Secondary | ICD-10-CM

## 2015-12-09 DIAGNOSIS — Z8249 Family history of ischemic heart disease and other diseases of the circulatory system: Secondary | ICD-10-CM | POA: Diagnosis not present

## 2015-12-09 DIAGNOSIS — R030 Elevated blood-pressure reading, without diagnosis of hypertension: Secondary | ICD-10-CM | POA: Diagnosis present

## 2015-12-09 DIAGNOSIS — O1410 Severe pre-eclampsia, unspecified trimester: Secondary | ICD-10-CM | POA: Diagnosis present

## 2015-12-09 DIAGNOSIS — O1493 Unspecified pre-eclampsia, third trimester: Secondary | ICD-10-CM

## 2015-12-09 LAB — COMPREHENSIVE METABOLIC PANEL
ALT: 39 U/L (ref 14–54)
AST: 35 U/L (ref 15–41)
Albumin: 3.2 g/dL — ABNORMAL LOW (ref 3.5–5.0)
Alkaline Phosphatase: 167 U/L — ABNORMAL HIGH (ref 38–126)
Anion gap: 8 (ref 5–15)
BUN: 18 mg/dL (ref 6–20)
CO2: 22 mmol/L (ref 22–32)
Calcium: 8.7 mg/dL — ABNORMAL LOW (ref 8.9–10.3)
Chloride: 101 mmol/L (ref 101–111)
Creatinine, Ser: 1.01 mg/dL — ABNORMAL HIGH (ref 0.44–1.00)
GFR calc Af Amer: >60 mL/min (ref >60)
GFR calc non Af Amer: >60 mL/min (ref >60)
Glucose, Bld: 77 mg/dL (ref 65–99)
Potassium: 4 mmol/L (ref 3.5–5.1)
Sodium: 131 mmol/L — ABNORMAL LOW (ref 135–145)
Total Bilirubin: 0.4 mg/dL (ref 0.3–1.2)
Total Protein: 6.6 g/dL (ref 6.5–8.1)

## 2015-12-09 LAB — CBC
HCT: 36.4 % (ref 36.0–46.0)
Hemoglobin: 13.1 g/dL (ref 12.0–15.0)
MCH: 31.5 pg (ref 26.0–34.0)
MCHC: 36 g/dL (ref 30.0–36.0)
MCV: 87.5 fL (ref 78.0–100.0)
Platelets: 192 10*3/uL (ref 150–400)
RBC: 4.16 MIL/uL (ref 3.87–5.11)
RDW: 12.9 % (ref 11.5–15.5)
WBC: 13.4 10*3/uL — ABNORMAL HIGH (ref 4.0–10.5)

## 2015-12-09 LAB — PROTEIN / CREATININE RATIO, URINE
Creatinine, Urine: 42 mg/dL
Protein Creatinine Ratio: 3.55 mg/mg{Cre} — ABNORMAL HIGH (ref 0.00–0.15)
Total Protein, Urine: 149 mg/dL

## 2015-12-09 LAB — URIC ACID: Uric Acid, Serum: 7.6 mg/dL — ABNORMAL HIGH (ref 2.3–6.6)

## 2015-12-09 LAB — TYPE AND SCREEN
ABO/RH(D): O POS
ANTIBODY SCREEN: NEGATIVE

## 2015-12-09 LAB — GROUP B STREP BY PCR: GROUP B STREP BY PCR: NEGATIVE

## 2015-12-09 LAB — ABO/RH: ABO/RH(D): O POS

## 2015-12-09 MED ORDER — BETAMETHASONE SOD PHOS & ACET 6 (3-3) MG/ML IJ SUSP
12.0000 mg | Freq: Once | INTRAMUSCULAR | Status: AC
  Administered 2015-12-09: 12 mg via INTRAMUSCULAR
  Filled 2015-12-09: qty 2

## 2015-12-09 MED ORDER — NIFEDIPINE 10 MG PO CAPS
10.0000 mg | ORAL_CAPSULE | Freq: Three times a day (TID) | ORAL | Status: DC
Start: 2015-12-09 — End: 2015-12-10
  Administered 2015-12-10: 10 mg via ORAL
  Filled 2015-12-09: qty 1

## 2015-12-09 MED ORDER — LABETALOL HCL 100 MG PO TABS: 200.0000 mg | ORAL_TABLET | Freq: Three times a day (TID) | ORAL | Status: DC

## 2015-12-09 MED ORDER — LABETALOL HCL 5 MG/ML IV SOLN
INTRAVENOUS | Status: AC
  Administered 2015-12-09: 80 mg via INTRAVENOUS
  Filled 2015-12-09: qty 16

## 2015-12-09 MED ORDER — LABETALOL HCL 200 MG PO TABS
200.0000 mg | ORAL_TABLET | Freq: Three times a day (TID) | ORAL | Status: DC
  Administered 2015-12-09: 200 mg via ORAL
  Filled 2015-12-09 (×3): qty 1

## 2015-12-09 MED ORDER — MISOPROSTOL 25 MCG QUARTER TABLET
25.0000 ug | ORAL_TABLET | ORAL | Status: DC | PRN
  Administered 2015-12-09 – 2015-12-10 (×2): 25 ug via VAGINAL
  Filled 2015-12-09 (×2): qty 0.25

## 2015-12-09 MED ORDER — LACTATED RINGERS IV SOLN
500.0000 mL | INTRAVENOUS | Status: DC | PRN
  Administered 2015-12-09: 1000 mL via INTRAVENOUS
  Administered 2015-12-10: 500 mL via INTRAVENOUS

## 2015-12-09 MED ORDER — MAGNESIUM SULFATE BOLUS VIA INFUSION
4.0000 g | Freq: Once | INTRAVENOUS | Status: AC
  Administered 2015-12-09: 4 g via INTRAVENOUS
  Filled 2015-12-09: qty 500

## 2015-12-09 MED ORDER — ACETAMINOPHEN 325 MG PO TABS
650.0000 mg | ORAL_TABLET | ORAL | Status: DC | PRN
  Administered 2015-12-10: 650 mg via ORAL
  Filled 2015-12-09: qty 2

## 2015-12-09 MED ORDER — MAGNESIUM SULFATE 50 % IJ SOLN
2.0000 g/h | INTRAMUSCULAR | Status: DC
  Administered 2015-12-09 – 2015-12-10 (×2): 2 g/h via INTRAVENOUS
  Filled 2015-12-09 (×2): qty 80

## 2015-12-09 MED ORDER — HYDRALAZINE HCL 20 MG/ML IJ SOLN
10.0000 mg | Freq: Once | INTRAMUSCULAR | Status: AC | PRN
  Administered 2015-12-09: 10 mg via INTRAVENOUS
  Filled 2015-12-09: qty 1

## 2015-12-09 MED ORDER — OXYTOCIN 10 UNIT/ML IJ SOLN: 10.0000 [IU] | Freq: Once | INTRAMUSCULAR | Status: DC | PRN

## 2015-12-09 MED ORDER — OXYCODONE-ACETAMINOPHEN 5-325 MG PO TABS: 2.0000 | ORAL_TABLET | ORAL | Status: DC | PRN

## 2015-12-09 MED ORDER — TERBUTALINE SULFATE 1 MG/ML IJ SOLN: 0.2500 mg | Freq: Once | INTRAMUSCULAR | Status: DC | PRN

## 2015-12-09 MED ORDER — LIDOCAINE HCL (PF) 1 % IJ SOLN: 30.0000 mL | INTRAMUSCULAR | Status: DC | PRN

## 2015-12-09 MED ORDER — OXYTOCIN BOLUS FROM INFUSION: 500.0000 mL | Freq: Once | INTRAVENOUS | Status: DC

## 2015-12-09 MED ORDER — OXYTOCIN 40 UNITS IN LACTATED RINGERS INFUSION - SIMPLE MED: 2.5000 [IU]/h | INTRAVENOUS | Status: DC

## 2015-12-09 MED ORDER — OXYCODONE-ACETAMINOPHEN 5-325 MG PO TABS: 1.0000 | ORAL_TABLET | ORAL | Status: DC | PRN

## 2015-12-09 MED ORDER — LABETALOL HCL 5 MG/ML IV SOLN
20.0000 mg | INTRAVENOUS | Status: AC | PRN
  Administered 2015-12-09: 20 mg via INTRAVENOUS
  Administered 2015-12-09: 80 mg via INTRAVENOUS
  Administered 2015-12-09 (×2): 20 mg via INTRAVENOUS
  Filled 2015-12-09 (×3): qty 4

## 2015-12-09 MED ORDER — LACTATED RINGERS IV SOLN
INTRAVENOUS | Status: DC
  Administered 2015-12-09: 19:00:00 via INTRAVENOUS

## 2015-12-09 MED ORDER — SOD CITRATE-CITRIC ACID 500-334 MG/5ML PO SOLN
30.0000 mL | ORAL | Status: DC | PRN
  Administered 2015-12-10: 30 mL via ORAL
  Filled 2015-12-09: qty 15

## 2015-12-09 NOTE — Progress Notes (Signed)
TC to Dr. Katherina Mires - notify of preterm admission (d/t NICU high census alert) / hx given / Dr. Katherina Mires gives approval for admission / expresses to let the patient know that if she delivers when the NICU can absolutely take no more babies, the baby will need to transferred to the nearest NICU that can accommodate and delivery the proper care needed.  Laury Deep, M MSN, CNM 12/09/2015 7:00 PM

## 2015-12-09 NOTE — H&P (Addendum)
Tara White is a 25 y.o. G1P0000 at [redacted]w[redacted]d presenting for evaluation of hypertension. Pt notes no contractions. Good fetal movement, No vaginal bleeding, not leaking fluid. Pt was seen in office today for ROB and noticed to have very elevated bp. Pt denies HA, SOB, CP, vision change, scotomata. Pt does note a marked increase in edema over the weekend that did not resolve with rest. Pt notes 4# wt gain over a few days. No prior medical problems. No history of hypertension.  [facility] at [facility] since 12 wks - Dated by 7 week ultrasound at outside facility -Recurrent vaginitis during pregnancy   Prenatal Transfer Tool  Maternal Diabetes: No Genetic Screening: Declined Maternal Ultrasounds/Referrals: Normal Fetal Ultrasounds or other Referrals:  None Maternal Substance Abuse:  No Significant Maternal Medications:  None Significant Maternal Lab Results: None     OB History    Gravida Para Term Preterm AB Living   1 0 0 0 0 0   SAB TAB Ectopic Multiple Live Births   0 0 0 0       History reviewed. No pertinent past medical history. Past Surgical History:  Procedure Laterality Date  . WISDOM TOOTH EXTRACTION     Family History: family history includes Heart failure in her paternal grandfather; Hypothyroidism in her mother. Social History:  reports that she has never smoked. She has never used smokeless tobacco. She reports that she drinks about 1.2 oz of alcohol per week . She reports that she does not use drugs.  Review of Systems - Negative except Edema     Blood pressure (!) 167/102, pulse (!) 54, temperature 97.5 F (36.4 C), temperature source Oral, resp. rate 16, last menstrual period 04/12/2015, SpO2 100 %.   Vitals:   12/09/15 1741 12/09/15 1746 12/09/15 1747 12/09/15 1817  BP:   (!) 181/101 (!) 167/102  Pulse: (!) 44 (!) 42 (!) 42 (!) 54  Resp:      Temp:      TempSrc:      SpO2: 100% 100%       Physical Exam:  Gen: well appearing, no distress, slightly  anxious given scenario  Back: no CVAT Abd: gravid, NT, no RUQ pain, no fundal tenderness LE: 3+1+ DTR, no clonus edema, equal bilaterally, non-tender,  Toco: No contractions FH: baseline 140s , accelerations present, no deceleratons, 10 beat variability  Prenatal labs: ABO, Rh:   Rh+  Antibody:   negative Rubella: !Error! immune  RPR:    nonreactive  HBsAg:    negative  HIV:    negative  GBS:    not done 1 hr Glucola 99   Genetic screening declined Anatomy  US normal   Assessment/Plan: 25 y.o. G1P0000 at [redacted]w[redacted]d Preeclampsia with severe features. Patient given dose of betamethasone while in MAU. Patient is being started on IV labetalol protocol to bring blood pressures back to normal range. Given severity of blood pressures along with laboratory abnormalities recommend admission for induction of labor. Will start mag sulfate for seizure prophylaxis. Will send GBS and will await results. As induction likely to take some time and patient without risk factors will not start meds for GBS unknown .  Patient is a midwifery patient and will allow midwives to manage induction of labor with physician oversight.   Kaitelyn Jamison A. 12/09/2015, 6:28 PM  RTUS/ bedside:  Vtx, ant placenta, fetal movement noted, AFI 11.9  Exam by CNM: closed/ long.  Plan cytotec o/n for ripening with pitocin/ foley in am.  Aloha Gell  A. 12/09/2015 7:01 PM

## 2015-12-09 NOTE — MAU Note (Signed)
Sent from MD office for lab work and preeclampsia workup.

## 2015-12-09 NOTE — Progress Notes (Signed)
Sent from office for evaluation for pre-eclampsia.  Symptoms: weight gain 5 pounds in 4 days  dependent edema elevated BP in office 160/98 proteinuria trace last week to >500 today on dipstick exam denies headache, epigastric pain, vision changes  TC to [place] on-call to notify of patient evaluation / orders placed in EPIC.  Artelia Laroche CNM [facility]

## 2015-12-10 ENCOUNTER — Inpatient Hospital Stay (HOSPITAL_COMMUNITY): Payer: Managed Care, Other (non HMO) | Admitting: Certified Registered Nurse Anesthetist

## 2015-12-10 ENCOUNTER — Encounter (HOSPITAL_COMMUNITY)
Admission: AD | Disposition: A | Discharge status: Home / Self Care | Payer: Self-pay | Source: Ambulatory Visit | Attending: Obstetrics

## 2015-12-10 ENCOUNTER — Encounter (HOSPITAL_COMMUNITY): Payer: Self-pay | Admitting: Anesthesiology

## 2015-12-10 LAB — CBC
HCT: 37.9 % (ref 36.0–46.0)
Hemoglobin: 13.6 g/dL (ref 12.0–15.0)
MCH: 31.1 pg (ref 26.0–34.0)
MCHC: 35.9 g/dL (ref 30.0–36.0)
MCV: 86.5 fL (ref 78.0–100.0)
PLATELETS: 192 10*3/uL (ref 150–400)
RBC: 4.38 MIL/uL (ref 3.87–5.11)
RDW: 12.9 % (ref 11.5–15.5)
WBC: 17.5 10*3/uL — AB (ref 4.0–10.5)

## 2015-12-10 LAB — COMPREHENSIVE METABOLIC PANEL
ALBUMIN: 3 g/dL — AB (ref 3.5–5.0)
ALT: 42 U/L (ref 14–54)
ANION GAP: 11 (ref 5–15)
AST: 42 U/L — AB (ref 15–41)
Alkaline Phosphatase: 163 U/L — ABNORMAL HIGH (ref 38–126)
BUN: 18 mg/dL (ref 6–20)
CHLORIDE: 105 mmol/L (ref 101–111)
CO2: 17 mmol/L — AB (ref 22–32)
Calcium: 7.9 mg/dL — ABNORMAL LOW (ref 8.9–10.3)
Creatinine, Ser: 0.88 mg/dL (ref 0.44–1.00)
GFR calc Af Amer: >60 mL/min (ref >60)
GFR calc non Af Amer: >60 mL/min (ref >60)
GLUCOSE: 119 mg/dL — AB (ref 65–99)
POTASSIUM: 4.4 mmol/L (ref 3.5–5.1)
SODIUM: 133 mmol/L — AB (ref 135–145)
Total Bilirubin: 0.5 mg/dL (ref 0.3–1.2)
Total Protein: 6.3 g/dL — ABNORMAL LOW (ref 6.5–8.1)

## 2015-12-10 LAB — MAGNESIUM: MAGNESIUM: 5.6 mg/dL — AB (ref 1.7–2.4)

## 2015-12-10 LAB — RPR: RPR: NONREACTIVE

## 2015-12-10 LAB — URIC ACID: Uric Acid, Serum: 7.7 mg/dL — ABNORMAL HIGH (ref 2.3–6.6)

## 2015-12-10 SURGERY — Surgical Case
Anesthesia: Spinal | Site: Abdomen | Wound class: Clean Contaminated

## 2015-12-10 MED ORDER — SIMETHICONE 80 MG PO CHEW
80.0000 mg | CHEWABLE_TABLET | ORAL | Status: DC | PRN
Start: 2015-12-10 — End: 2015-12-13
  Administered 2015-12-11 (×2): 80 mg via ORAL
  Filled 2015-12-10 (×3): qty 1

## 2015-12-10 MED ORDER — KETOROLAC TROMETHAMINE 30 MG/ML IJ SOLN
30.0000 mg | Freq: Once | INTRAMUSCULAR | Status: AC
  Administered 2015-12-10: 30 mg via INTRAVENOUS

## 2015-12-10 MED ORDER — NALBUPHINE HCL 10 MG/ML IJ SOLN: 5.0000 mg | INTRAMUSCULAR | Status: DC | PRN

## 2015-12-10 MED ORDER — NALBUPHINE HCL 10 MG/ML IJ SOLN
5.0000 mg | Freq: Once | INTRAMUSCULAR | Status: DC | PRN
Start: 2015-12-10 — End: 2015-12-12

## 2015-12-10 MED ORDER — WITCH HAZEL-GLYCERIN EX PADS: 1.0000 "application " | MEDICATED_PAD | CUTANEOUS | Status: DC | PRN

## 2015-12-10 MED ORDER — MORPHINE SULFATE (PF) 0.5 MG/ML IJ SOLN
INTRAMUSCULAR | Status: DC | PRN
  Administered 2015-12-10: .2 mg via INTRATHECAL

## 2015-12-10 MED ORDER — FENTANYL CITRATE (PF) 100 MCG/2ML IJ SOLN
INTRAMUSCULAR | Status: AC
  Filled 2015-12-10: qty 2

## 2015-12-10 MED ORDER — FENTANYL CITRATE (PF) 100 MCG/2ML IJ SOLN
INTRAMUSCULAR | Status: DC | PRN
  Administered 2015-12-10: 10 ug via INTRATHECAL

## 2015-12-10 MED ORDER — ONDANSETRON HCL 4 MG/2ML IJ SOLN
4.0000 mg | Freq: Three times a day (TID) | INTRAMUSCULAR | Status: DC | PRN
  Administered 2015-12-10: 4 mg via INTRAVENOUS
  Filled 2015-12-10: qty 2

## 2015-12-10 MED ORDER — PRENATAL MULTIVITAMIN CH
1.0000 | ORAL_TABLET | Freq: Every day | ORAL | Status: DC
  Administered 2015-12-11 – 2015-12-13 (×3): 1 via ORAL
  Filled 2015-12-10 (×3): qty 1

## 2015-12-10 MED ORDER — NALOXONE HCL 2 MG/2ML IJ SOSY
1.0000 ug/kg/h | PREFILLED_SYRINGE | INTRAVENOUS | Status: DC | PRN
  Filled 2015-12-10: qty 2

## 2015-12-10 MED ORDER — LACTATED RINGERS IV SOLN
INTRAVENOUS | Status: DC | PRN
  Administered 2015-12-10: 09:00:00 via INTRAVENOUS

## 2015-12-10 MED ORDER — OXYTOCIN 40 UNITS IN LACTATED RINGERS INFUSION - SIMPLE MED: 2.5000 [IU]/h | INTRAVENOUS | Status: AC

## 2015-12-10 MED ORDER — DIPHENHYDRAMINE HCL 25 MG PO CAPS: 25.0000 mg | ORAL_CAPSULE | ORAL | Status: DC | PRN

## 2015-12-10 MED ORDER — OXYCODONE HCL 5 MG PO TABS: 5.0000 mg | ORAL_TABLET | ORAL | Status: DC | PRN

## 2015-12-10 MED ORDER — CEFAZOLIN SODIUM-DEXTROSE 2-4 GM/100ML-% IV SOLN
2.0000 g | Freq: Once | INTRAVENOUS | Status: DC
  Filled 2015-12-10: qty 100

## 2015-12-10 MED ORDER — SIMETHICONE 80 MG PO CHEW
80.0000 mg | CHEWABLE_TABLET | Freq: Three times a day (TID) | ORAL | Status: DC
  Administered 2015-12-10 – 2015-12-13 (×7): 80 mg via ORAL
  Filled 2015-12-10 (×7): qty 1

## 2015-12-10 MED ORDER — LACTATED RINGERS IV SOLN
INTRAVENOUS | Status: DC
  Administered 2015-12-10: 17:00:00 via INTRAVENOUS
  Administered 2015-12-11: 100 mL/h via INTRAVENOUS

## 2015-12-10 MED ORDER — PHENYLEPHRINE 8 MG IN D5W 100 ML (0.08MG/ML) PREMIX OPTIME
INJECTION | INTRAVENOUS | Status: AC
  Filled 2015-12-10: qty 100

## 2015-12-10 MED ORDER — CEFAZOLIN SODIUM-DEXTROSE 2-3 GM-% IV SOLR
INTRAVENOUS | Status: DC | PRN
  Administered 2015-12-10: 2 g via INTRAVENOUS

## 2015-12-10 MED ORDER — OXYTOCIN 10 UNIT/ML IJ SOLN
INTRAMUSCULAR | Status: AC
  Filled 2015-12-10: qty 4

## 2015-12-10 MED ORDER — KETOROLAC TROMETHAMINE 30 MG/ML IJ SOLN: 30.0000 mg | Freq: Four times a day (QID) | INTRAMUSCULAR | Status: DC | PRN

## 2015-12-10 MED ORDER — OXYTOCIN 10 UNIT/ML IJ SOLN
INTRAVENOUS | Status: DC | PRN
  Administered 2015-12-10: 40 [IU] via INTRAVENOUS

## 2015-12-10 MED ORDER — MEPERIDINE HCL 25 MG/ML IJ SOLN: 6.2500 mg | INTRAMUSCULAR | Status: DC | PRN

## 2015-12-10 MED ORDER — IBUPROFEN 600 MG PO TABS
600.0000 mg | ORAL_TABLET | Freq: Four times a day (QID) | ORAL | Status: DC | PRN
  Administered 2015-12-10 – 2015-12-11 (×2): 600 mg via ORAL
  Filled 2015-12-10: qty 1

## 2015-12-10 MED ORDER — ACETAMINOPHEN 500 MG PO TABS
1000.0000 mg | ORAL_TABLET | Freq: Four times a day (QID) | ORAL | Status: AC
  Administered 2015-12-10 – 2015-12-11 (×4): 1000 mg via ORAL
  Filled 2015-12-10 (×4): qty 2

## 2015-12-10 MED ORDER — SODIUM CHLORIDE 0.9% FLUSH: 3.0000 mL | INTRAVENOUS | Status: DC | PRN

## 2015-12-10 MED ORDER — NALOXONE HCL 0.4 MG/ML IJ SOLN: 0.4000 mg | INTRAMUSCULAR | Status: DC | PRN

## 2015-12-10 MED ORDER — MENTHOL 3 MG MT LOZG: 1.0000 | LOZENGE | OROMUCOSAL | Status: DC | PRN

## 2015-12-10 MED ORDER — TETANUS-DIPHTH-ACELL PERTUSSIS 5-2.5-18.5 LF-MCG/0.5 IM SUSP
0.5000 mL | Freq: Once | INTRAMUSCULAR | Status: AC
  Administered 2015-12-12: 0.5 mL via INTRAMUSCULAR

## 2015-12-10 MED ORDER — SIMETHICONE 80 MG PO CHEW
80.0000 mg | CHEWABLE_TABLET | ORAL | Status: DC
  Administered 2015-12-11 – 2015-12-12 (×3): 80 mg via ORAL
  Filled 2015-12-10 (×3): qty 1

## 2015-12-10 MED ORDER — DIBUCAINE 1 % RE OINT: 1.0000 "application " | TOPICAL_OINTMENT | RECTAL | Status: DC | PRN

## 2015-12-10 MED ORDER — PHENYLEPHRINE 8 MG IN D5W 100 ML (0.08MG/ML) PREMIX OPTIME
INJECTION | INTRAVENOUS | Status: DC | PRN
  Administered 2015-12-10: 30 ug/min via INTRAVENOUS

## 2015-12-10 MED ORDER — DIPHENHYDRAMINE HCL 50 MG/ML IJ SOLN: 12.5000 mg | INTRAMUSCULAR | Status: DC | PRN

## 2015-12-10 MED ORDER — MORPHINE SULFATE-NACL 0.5-0.9 MG/ML-% IV SOSY
PREFILLED_SYRINGE | INTRAVENOUS | Status: AC
  Filled 2015-12-10: qty 1

## 2015-12-10 MED ORDER — BUPIVACAINE IN DEXTROSE 0.75-8.25 % IT SOLN
INTRATHECAL | Status: DC | PRN
  Administered 2015-12-10: 1.4 mL via INTRATHECAL

## 2015-12-10 MED ORDER — DIPHENHYDRAMINE HCL 25 MG PO CAPS: 25.0000 mg | ORAL_CAPSULE | Freq: Four times a day (QID) | ORAL | Status: DC | PRN

## 2015-12-10 MED ORDER — PROMETHAZINE HCL 25 MG/ML IJ SOLN: 6.2500 mg | INTRAMUSCULAR | Status: DC | PRN

## 2015-12-10 MED ORDER — ONDANSETRON HCL 4 MG/2ML IJ SOLN
INTRAMUSCULAR | Status: DC | PRN
  Administered 2015-12-10: 4 mg via INTRAVENOUS

## 2015-12-10 MED ORDER — OXYCODONE HCL 5 MG PO TABS: 10.0000 mg | ORAL_TABLET | ORAL | Status: DC | PRN

## 2015-12-10 MED ORDER — ZOLPIDEM TARTRATE 5 MG PO TABS: 5.0000 mg | ORAL_TABLET | Freq: Every evening | ORAL | Status: DC | PRN

## 2015-12-10 MED ORDER — KETOROLAC TROMETHAMINE 30 MG/ML IJ SOLN
INTRAMUSCULAR | Status: AC
  Filled 2015-12-10: qty 1

## 2015-12-10 MED ORDER — ACETAMINOPHEN 325 MG PO TABS: 650.0000 mg | ORAL_TABLET | ORAL | Status: DC | PRN

## 2015-12-10 MED ORDER — COCONUT OIL OIL
1.0000 "application " | TOPICAL_OIL | Status: DC | PRN
  Administered 2015-12-11: 1 via TOPICAL
  Filled 2015-12-10: qty 120

## 2015-12-10 MED ORDER — IBUPROFEN 600 MG PO TABS
600.0000 mg | ORAL_TABLET | Freq: Four times a day (QID) | ORAL | Status: DC
  Administered 2015-12-10 – 2015-12-13 (×9): 600 mg via ORAL
  Filled 2015-12-10 (×12): qty 1

## 2015-12-10 MED ORDER — PHENYLEPHRINE HCL 10 MG/ML IJ SOLN
INTRAMUSCULAR | Status: DC | PRN
  Administered 2015-12-10 (×2): 40 ug via INTRAVENOUS

## 2015-12-10 MED ORDER — NALBUPHINE HCL 10 MG/ML IJ SOLN: 5.0000 mg | Freq: Once | INTRAMUSCULAR | Status: DC | PRN

## 2015-12-10 MED ORDER — HYDROMORPHONE HCL 1 MG/ML IJ SOLN: 0.2500 mg | INTRAMUSCULAR | Status: DC | PRN

## 2015-12-10 MED ORDER — SENNOSIDES-DOCUSATE SODIUM 8.6-50 MG PO TABS
2.0000 | ORAL_TABLET | ORAL | Status: DC
  Administered 2015-12-11 – 2015-12-12 (×3): 2 via ORAL
  Filled 2015-12-10 (×3): qty 2

## 2015-12-10 MED ORDER — SCOPOLAMINE 1 MG/3DAYS TD PT72
1.0000 | MEDICATED_PATCH | Freq: Once | TRANSDERMAL | Status: DC
  Administered 2015-12-10: 1.5 mg via TRANSDERMAL
  Filled 2015-12-10: qty 1

## 2015-12-10 MED ORDER — ONDANSETRON HCL 4 MG/2ML IJ SOLN
INTRAMUSCULAR | Status: AC
  Filled 2015-12-10: qty 2

## 2015-12-10 MED ORDER — DEXAMETHASONE SODIUM PHOSPHATE 10 MG/ML IJ SOLN
INTRAMUSCULAR | Status: DC | PRN
  Administered 2015-12-10: 4 mg via INTRAVENOUS

## 2015-12-10 SURGICAL SUPPLY — 35 items
BENZOIN TINCTURE PRP APPL 2/3 (GAUZE/BANDAGES/DRESSINGS) ×3 IMPLANT
CHLORAPREP W/TINT 26ML (MISCELLANEOUS) ×3 IMPLANT
CLAMP CORD UMBIL (MISCELLANEOUS) IMPLANT
CLOSURE WOUND 1/2 X4 (GAUZE/BANDAGES/DRESSINGS) ×1
CLOTH BEACON ORANGE TIMEOUT ST (SAFETY) ×3 IMPLANT
CONTAINER PREFILL 10% NBF 15ML (MISCELLANEOUS) IMPLANT
DRSG OPSITE POSTOP 4X10 (GAUZE/BANDAGES/DRESSINGS) ×3 IMPLANT
ELECT REM PT RETURN 9FT ADLT (ELECTROSURGICAL) ×3
ELECTRODE REM PT RTRN 9FT ADLT (ELECTROSURGICAL) ×1 IMPLANT
EXTRACTOR VACUUM M CUP 4 TUBE (SUCTIONS) IMPLANT
EXTRACTOR VACUUM M CUP 4' TUBE (SUCTIONS)
GLOVE BIO SURGEON STRL SZ 6.5 (GLOVE) ×2 IMPLANT
GLOVE BIO SURGEONS STRL SZ 6.5 (GLOVE) ×1
GLOVE BIOGEL PI IND STRL 7.0 (GLOVE) ×2 IMPLANT
GLOVE BIOGEL PI INDICATOR 7.0 (GLOVE) ×4
GOWN STRL REUS W/TWL LRG LVL3 (GOWN DISPOSABLE) ×6 IMPLANT
KIT ABG SYR 3ML LUER SLIP (SYRINGE) IMPLANT
NEEDLE HYPO 22GX1.5 SAFETY (NEEDLE) IMPLANT
NEEDLE HYPO 25X5/8 SAFETYGLIDE (NEEDLE) IMPLANT
NS IRRIG 1000ML POUR BTL (IV SOLUTION) ×3 IMPLANT
PACK C SECTION WH (CUSTOM PROCEDURE TRAY) ×3 IMPLANT
PAD OB MATERNITY 4.3X12.25 (PERSONAL CARE ITEMS) ×3 IMPLANT
PENCIL SMOKE EVAC W/HOLSTER (ELECTROSURGICAL) ×3 IMPLANT
STRIP CLOSURE SKIN 1/2X4 (GAUZE/BANDAGES/DRESSINGS) ×2 IMPLANT
SUT MON AB 4-0 PS1 27 (SUTURE) ×3 IMPLANT
SUT PLAIN 0 NONE (SUTURE) IMPLANT
SUT PLAIN 2 0 XLH (SUTURE) IMPLANT
SUT VIC AB 0 CT1 36 (SUTURE) ×6 IMPLANT
SUT VIC AB 0 CTX 36 (SUTURE) ×4
SUT VIC AB 0 CTX36XBRD ANBCTRL (SUTURE) ×2 IMPLANT
SUT VIC AB 2-0 CT1 27 (SUTURE) ×2
SUT VIC AB 2-0 CT1 TAPERPNT 27 (SUTURE) ×1 IMPLANT
SYR CONTROL 10ML LL (SYRINGE) IMPLANT
TOWEL OR 17X24 6PK STRL BLUE (TOWEL DISPOSABLE) ×3 IMPLANT
TRAY FOLEY CATH SILVER 14FR (SET/KITS/TRAYS/PACK) IMPLANT

## 2015-12-10 NOTE — Progress Notes (Signed)
Tara White [facility] reassumed care of patient.

## 2015-12-10 NOTE — Transfer of Care (Signed)
Immediate Anesthesia Transfer of Care Note  Patient: Tara White  Procedure(s) Performed: Procedure(s): CESAREAN SECTION (N/A)  Patient Location: PACU  Anesthesia Type:Spinal  Level of Consciousness: awake, alert  and oriented  Airway & Oxygen Therapy: Patient Spontanous Breathing  Post-op Assessment: Report given to RN and Post -op Vital signs reviewed and stable  Post vital signs: Reviewed and stable  Last Vitals:  Vitals:   12/10/15 0701 12/10/15 0800  BP: 136/66   Pulse: 84   Resp: 16 16  Temp:      Last Pain:  Vitals:   12/10/15 0800  TempSrc:   PainSc: 2       Patients Stated Pain Goal: 1 ([id number])  Complications: No apparent anesthesia complications

## 2015-12-10 NOTE — Brief Op Note (Signed)
12/09/2015 - 12/10/2015  10:03 AM  PATIENT:  Tara White  25 y.o. female  PRE-OPERATIVE DIAGNOSIS:  nonreassuring fetal heart tones, + severe preeclampsia  POST-OPERATIVE DIAGNOSIS:  nonreassuring fetal heart tones, severe preeclampsia  PROCEDURE:  Procedure(s): CESAREAN SECTION (N/A)  Low-transverse with 2 layer closure  SURGEON:  Surgeon(s) and Role:    * Aloha Gell, MD - Primary  PHYSICIAN ASSISTANT:   ASSISTANTS: Payton Spark CNM   ANESTHESIA:   spinal  EBL:  Total I/O In: 900 [I.V.:900] Out: 700 [Urine:100; Blood:600]  BLOOD ADMINISTERED:none  DRAINS: Urinary Catheter (Foley)   LOCAL MEDICATIONS USED:  NONE  SPECIMEN:  Source of Specimen:  Placenta  DISPOSITION OF SPECIMEN:  PATHOLOGY  COUNTS:  YES  TOURNIQUET:  * No tourniquets in log *  DICTATION: .Dragon Dictation  PLAN OF CARE: Admit to inpatient   PATIENT DISPOSITION:  PACU - hemodynamically stable.   Delay start of Pharmacological VTE agent (>24hrs) due to surgical blood loss or risk of bleeding: yes

## 2015-12-10 NOTE — Progress Notes (Signed)
Care assumed by Dreama Saa at 0000 12/10/15.

## 2015-12-10 NOTE — Lactation Note (Signed)
This note was copied from a baby's chart. Lactation Consultation Note  Patient Name: Girl Kyniah Lush [id number] Date: 12/10/2015 Reason for consult: Follow-up assessment;NICU baby;Infant < 6lbs;Late preterm infant   DEBP set up for mom. Assembly, disassembly, pumping and cleaning of pump parts reviewed with parents. Enc mom to pump every 2-3 hours for 15 minutes on Initiate setting and follow with hand expression. Enc mom to call with questions/concerns prn. Update to [facility], [facility].    Maternal Data Formula Feeding for Exclusion: No Has patient been taught Hand Expression?: Yes Does the patient have breastfeeding experience prior to this delivery?: No  Feeding    LATCH Score/Interventions                      Lactation Tools Discussed/Used WIC Program: No Pump Review: Setup, frequency, and cleaning;Milk Storage Initiated by:: Nonah Mattes, RN, IBCLC Date initiated:: 12/10/15   Consult Status Consult Status: Follow-up Date: 12/11/15 Follow-up type: In-patient    Debby Freiberg Travonta Gill 12/10/2015, 12:32 PM

## 2015-12-10 NOTE — Progress Notes (Signed)
Ladene Artist, RN assumed care of patient.

## 2015-12-10 NOTE — Lactation Note (Signed)
This note was copied from a baby's chart. Lactation Consultation Note  Patient Name: Girl Chantra Wertheimer [id number] Date: 12/10/2015 Reason for consult: Initial assessment;NICU baby;Infant < 6lbs;Late preterm infant   Initial consult with first time mom in PACU. Infant born at [facility] and is in NICU. Infant is on room air per dad and doing well. Discussed with mom what to expect with BF while infant in NICU.  Mom with small firm breasts and, small areola and everted nipples. She noted increased breast size with pregnancy. She has a medela PIS at home for use.   Mom plans to BF infant. Hand Expressed mom and received a few large gtts of colostrum from both breasts. Mom was pleased.   BF Resources Handout and Providing Milk for Your Infant in NICU. Discussed supply and demand and reviewed pumping schedule and what to expect with pumping. Enc mom to pump every 2-3 hours for 15 minutes followed by hand expression. Discussed BM Storage for the NICU infant. Discussed with mom renting a hospital grade electric breast pump after d/c to stimulate milk supply. Told mom we will discuss again at d/c. Mom was informed of pumping rooms in NICU.   [facility] Brochure given, mom was informed of IP/OP Services, BF Support Groups and [place] phone #. Enc mom to call for assistance in hospital as needed.    Maternal Data Formula Feeding for Exclusion: No Has patient been taught Hand Expression?: Yes Does the patient have breastfeeding experience prior to this delivery?: No  Feeding    LATCH Score/Interventions                      Lactation Tools Discussed/Used WIC Program: No   Consult Status Consult Status: Follow-up Date: 12/10/15 Follow-up type: In-patient    Debby Freiberg Becker Christopher 12/10/2015, 11:44 AM

## 2015-12-10 NOTE — Progress Notes (Signed)
CTSP for decels  Pt notes no HA, no vaginal bleeding. Pt notes mild cramping but has been sleeping.   Vitals:   12/10/15 0101 12/10/15 0201 12/10/15 0301 12/10/15 0401  BP: 121/68 132/79 125/82 132/78  Pulse: 78 75 80 81  Resp: 18 18 18 18   Temp:   98.4 F (36.9 C)   TempSrc:   Axillary   SpO2:      Weight:      Height:        Toco: no ctx noted FH: 120's, repetitive variable decels over 30-40 min, stopped after pt up to void. B/l 120s. + 10 x 10 accels with improved beat to beat variability.  Cvx: deferred  A/P: Severe PEC for IOL IOL, 2nd cytotec placed 2 hrs ago. Plan pitocin after cytotec FWB. Monitor closely. Baby has responded well to position change.  Johnanthony Wilden A. 12/10/2015 5:42 AM

## 2015-12-10 NOTE — Anesthesia Postprocedure Evaluation (Signed)
Anesthesia Post Note  Patient: Tara White  Procedure(s) Performed: Procedure(s) (LRB): CESAREAN SECTION (N/A)  Patient location during evaluation: [facility] Unit Anesthesia Type: Spinal Level of consciousness: awake and alert Pain management: pain level controlled Vital Signs Assessment: post-procedure vital signs reviewed and stable Respiratory status: spontaneous breathing and nonlabored ventilation Cardiovascular status: stable Postop Assessment: no headache, patient able to bend at knees, no backache, no signs of nausea or vomiting, spinal receding and adequate PO intake Anesthetic complications: no     Last Vitals:  Vitals:   12/10/15 1604 12/10/15 1700  BP: 126/70 128/76  Pulse: 60 62  Resp: 18 17  Temp:  36.2 C    Last Pain:  Vitals:   12/10/15 1700  TempSrc: Axillary  PainSc: 1    Pain Goal: Patients Stated Pain Goal: 2 (12/10/15 1700)               Medhansh Brinkmeier Hristova

## 2015-12-10 NOTE — Progress Notes (Signed)
Patient ID: Tara White, female   DOB: 1991-01-14, 25 y.o.   MRN: [id number] Subjective: Tara White is a 25 y.o. G1P0000 at [redacted]w[redacted]d by LMP admitted for induction of labor due to Pre-eclamptic toxemia of pregnancy..  Objective: BP @ 0101: 121/68 Vitals:   12/10/15 0201 12/10/15 0301 12/10/15 0401 12/10/15 0501  BP: 132/79 125/82 132/78 127/60  Pulse: 75 80 81 90  Resp: 18 18 18 16   Temp:  98.4 F (36.9 C)    TempSrc:  Axillary    SpO2:      Weight:      Height:        No intake/output data recorded. Total I/O In: 1298.3 [P.O.:240; I.V.:1058.3] Out: 1100 [Urine:1100]   FHT:  FHR: 115 bpm, variability: moderate,  accelerations:  Present,  decelerations:  Absent UC:   regular, every 2-7 minutes SVE:   Dilation: Closed Effacement (%): Thick Station: Ballotable Exam by:: e. poore, rnc  Labs:   Recent Labs  12/09/15 1703  WBC 13.4*  HGB 13.1  HCT 36.4  PLT 192    Assessment / Plan: Induction of labor due to preeclampsia  Labor: IOL with vaginal Cytotec Preeclampsia:  on magnesium sulfate - continue 2 gm/hr Fetal Wellbeing:  Category I Pain Control:  Labor support without medications Anticipated MOD:  NSVD   *Discussed with patient and spouse neonatologist will come tomorrow to discuss possible admission to NICU and the possible transfer to another facility [facility]) for NICU care post-delivery / patient and spouse verbalized an understanding.  **Dr. Pamala Hurry updated on patient's status  Tara White, Tara Blood, MSN, CNM 12/10/2015, 1:45 AM

## 2015-12-10 NOTE — Addendum Note (Signed)
Addendum  created 12/10/15 1818 by Hewitt Blade, CRNA   Sign clinical note

## 2015-12-10 NOTE — Lactation Note (Signed)
This note was copied from a baby's chart. Lactation Consultation Note  Patient Name: Tara White [id number] Date: 12/10/2015 Reason for consult: Follow-up assessment;NICU baby;Infant < 6lbs;Late preterm infant  Baby 14 hours old. Mom resting when LC entered the room. Mom reports that she pumped once already (at 1400) and sent colostrum to the NICU. Mom states that she intends to pump again at 1700. Enc mom to call for assistance as needed. Maternal Data    Feeding    LATCH Score/Interventions                      Lactation Tools Discussed/Used WIC Program: No Pump Review: Setup, frequency, and cleaning;Milk Storage Initiated by:: Nonah Mattes, RN, IBCLC Date initiated:: 12/10/15   Consult Status Consult Status: Follow-up Date: 12/11/15 Follow-up type: In-patient    Andres Labrum 12/10/2015, 3:40 PM

## 2015-12-10 NOTE — Progress Notes (Signed)
Care assumed by Ladene Artist, RN at 2300 12/09/15.

## 2015-12-10 NOTE — Anesthesia Procedure Notes (Signed)
Spinal  Patient location during procedure: OR Start time: 12/10/2015 8:55 AM End time: 12/10/2015 8:59 AM Staffing Anesthesiologist: Lyn Hollingshead Performed: anesthesiologist  Preanesthetic Checklist Completed: patient identified, surgical consent, pre-op evaluation, timeout performed, IV checked, risks and benefits discussed and monitors and equipment checked Spinal Block Patient position: sitting Prep: site prepped and draped and DuraPrep Patient monitoring: heart rate, cardiac monitor, continuous pulse ox and blood pressure Approach: midline Location: L3-4 Injection technique: single-shot Needle Needle type: Sprotte  Needle gauge: 24 G Needle length: 9 cm Needle insertion depth: 5 cm Assessment Sensory level: T4

## 2015-12-10 NOTE — Anesthesia Postprocedure Evaluation (Signed)
Anesthesia Post Note  Patient: Tara White  Procedure(s) Performed: Procedure(s) (LRB): CESAREAN SECTION (N/A)  Patient location during evaluation: PACU Anesthesia Type: Spinal Level of consciousness: awake Pain management: pain level controlled Vital Signs Assessment: post-procedure vital signs reviewed and stable Respiratory status: spontaneous breathing Cardiovascular status: stable Postop Assessment: no headache, no backache, spinal receding, patient able to bend at knees and no signs of nausea or vomiting Anesthetic complications: no     Last Vitals:  Vitals:   12/10/15 1045 12/10/15 1100  BP: 126/73 126/71  Pulse: 69 67  Resp: (!) 21 15  Temp:      Last Pain:  Vitals:   12/10/15 1007  TempSrc: Oral  PainSc:    Pain Goal: Patients Stated Pain Goal: 1 (12/10/15 0329)  LLE Motor Response: Purposeful movement (very slight) (12/10/15 1115)   RLE Motor Response: Purposeful movement (12/10/15 1115)        Huey Scalia JR,JOHN Mateo Flow

## 2015-12-10 NOTE — Op Note (Signed)
12/09/2015 - 12/10/2015  10:03 AM  PATIENT:  Tara White  25 y.o. female  PRE-OPERATIVE DIAGNOSIS:  nonreassuring fetal heart tones, + severe preeclampsia  POST-OPERATIVE DIAGNOSIS:  nonreassuring fetal heart tones, severe preeclampsia  PROCEDURE:  Procedure(s): CESAREAN SECTION (N/A)  Low-transverse with 2 layer closure  SURGEON:  Surgeon(s) and Role:    * Aloha Gell, MD - Primary  PHYSICIAN ASSISTANT:   ASSISTANTS: Payton Spark CNM   ANESTHESIA:   spinal  EBL:  Total I/O In: 900 [I.V.:900] Out: 700 [Urine:100; Blood:600]  BLOOD ADMINISTERED:none  DRAINS: Urinary Catheter (Foley)   LOCAL MEDICATIONS USED:  NONE  SPECIMEN:  Source of Specimen:  Placenta  DISPOSITION OF SPECIMEN:  PATHOLOGY  COUNTS:  YES  TOURNIQUET:  * No tourniquets in log *  DICTATION: .Dragon Dictation  PLAN OF CARE: Admit to inpatient   PATIENT DISPOSITION:  PACU - hemodynamically stable.   Delay start of Pharmacological VTE agent (>24hrs) due to surgical blood loss or risk of bleeding: yes     Findings:  @BABYSEXEBC @ infant,  APGAR (1 MIN): 8   APGAR (5 MINS): 10   APGAR (10 MINS):   Normal uterus, tubes and ovaries, normal placenta. 3VC, clear amniotic fluid, nuchal cord and body cord, placenta expressed easily after delivery  EBL: Per anesthesia notes Antibiotics:   2g Ancef Complications: none  Indications: This is a 25 y.o. year-old, G1  At [redacted]w[redacted]d admitted for induction of labor due to acute onset severe preeclampsia. Patient needed several doses of IV labetalol as well as oral labetalol and Procardia. Blood pressure was controlled but given the severe elevation and lab abnormalities decision was made to move to induction of labor after administering betamethasone. Group B strep returned negative. Patient had an unfavorable cervix and was given Cytotec overnight. Shortly after her second Cytotec she had about a 20 minute run of recurrent deep variable decelerations. These improved  slightly with position change voiding and maternal oxygen. She was also given an IV fluid bolus at that time. Over the next several hours good variability was noted with occasional out ask however decelerations late in timing were noted with intermittent contractions. Due to the decelerations on Pitocin was never started. Given concern for fetal intolerance to labor discussion was had with patient regarding trauma Pitocin versus primary cesarean section. Given late decelerations remote from delivery and early non-tolerance to labor decision was made to proceed with cesarean section.. Risks benefits and alternatives of the procedure were discussed with the patient who agreed to proceed  Procedure:  After informed consent was obtained the patient was taken to the operating room where spinal anesthesia was initiated.  She was prepped and draped in the normal sterile fashion in dorsal supine position with a leftward tilt.  A foley catheter was in place.  A Pfannenstiel skin incision was made 2 cm above the pubic symphysis in the midline with the scalpel.  Dissection was carried down with the Bovie cautery until the fascia was reached. The fascia was incised in the midline. The incision was extended laterally with the Mayo scissors. The inferior aspect of the fascial incision was grasped with the Coker clamps, elevated up and the underlying rectus muscles were dissected off sharply. The superior aspect of the fascial incision was grasped with the Coker clamps elevated up and the underlying rectus muscles were dissected off sharply.  The peritoneum was entered sharply. The peritoneal incision was extended superiorly and inferiorly with good visualization of the bladder. A bladder flap was  created sharply The bladder blade was inserted and palpation was done to assess the fetal position and the location of the uterine vessels. The lower segment of the uterus was incised sharply with the scalpel and extended  bluntly in  the cephalo-caudal fashion. The infant was grasped, brought to the incision,  rotated and the infant was delivered with fundal pressure. The nose and mouth were bulb suctioned. The cord was clamped and cut after 1 minute delay The infant was handed off to the waiting pediatrician. The placenta was expressed. The uterus was exteriorized. The uterus was cleared of all clots and debris. The uterine incision was repaired with 0 Vicryl in a running locked fashion.  A second layer of the same suture was used in an imbricating fashion to obtain excellent hemostasis.  The uterus was then returned to the abdomen, the gutters were cleared of all clots and debris. The uterine incision was reinspected and found to be hemostatic. The peritoneum was grasped and closed with 2-0 Vicryl in a running fashion. The cut muscle edges and the underside of the fascia were inspected and found to be hemostatic. The fascia was closed with 0 Vicryl in two halves . The subcutaneous tissue was irrigated. Scarpa's layer was closed with a 2-0 plain gut suture. The skin was closed with a 4-0 Monocryl in a single layer. The patient tolerated the procedure well. Sponge lap and needle counts were correct x3 and patient was taken to the recovery room in a stable condition.  Kennedy Bohanon A. 12/10/2015 10:06 AM

## 2015-12-10 NOTE — Anesthesia Preprocedure Evaluation (Addendum)
Anesthesia Evaluation  Patient identified by MRN, date of birth, ID band Patient awake    Reviewed: Allergy & Precautions, H&P , NPO status , Patient's Chart, lab work & pertinent test results  Airway Mallampati: I  TM Distance: >3 FB Neck ROM: full    Dental no notable dental hx.    Pulmonary neg pulmonary ROS,    Pulmonary exam normal        Cardiovascular Normal cardiovascular exam     Neuro/Psych negative neurological ROS  negative psych ROS   GI/Hepatic negative GI ROS, Neg liver ROS,   Endo/Other  negative endocrine ROS  Renal/GU negative Renal ROS     Musculoskeletal   Abdominal Normal abdominal exam  (+)   Peds  Hematology negative hematology ROS (+)   Anesthesia Other Findings   Reproductive/Obstetrics (+) Pregnancy                            Anesthesia Physical Anesthesia Plan  ASA: II  Anesthesia Plan: Spinal   Post-op Pain Management:    Induction:   Airway Management Planned:   Additional Equipment:   Intra-op Plan:   Post-operative Plan:   Informed Consent: I have reviewed the patients History and Physical, chart, labs and discussed the procedure including the risks, benefits and alternatives for the proposed anesthesia with the patient or authorized representative who has indicated his/her understanding and acceptance.     Plan Discussed with: Surgeon and CRNA  Anesthesia Plan Comments:        Anesthesia Quick Evaluation

## 2015-12-10 NOTE — Progress Notes (Signed)
INTERVAL NOTE: 7 hrs post-op, C/S severe PEC/NRFHT, remote from delivery  S:  Resting in bed, feels sleepy/tired. Denies HA/RUQ pain/visual changes. Nausea frequent earlier, now improving. Pain minimal.  Mag Sulfate 2 gm/hr  O:  VSS, AAO x 3, NAD  FF bellow U  Scant lochia  Foley cath in place, clear yellow urine  Incision dressing / Honeycomb dressing, 1/3 soiled serosanguinous dc.  LE's no edema, SCD's on Labs:  Lab Results  Component Value Date   WBC 17.5 (H) 12/10/2015   HGB 13.6 12/10/2015   HCT 37.9 12/10/2015   MCV 86.5 12/10/2015   PLT 192 12/10/2015   Lab Results  Component Value Date   ALT 42 12/10/2015   AST 42 (H) 12/10/2015   ALKPHOS 163 (H) 12/10/2015   BILITOT 0.5 12/10/2015    Intake/Output Summary (Last 24 hours) at 12/10/15 1645 Last data filed at 12/10/15 1510  Gross per 24 hour  Intake          3801.83 ml  Output             2025 ml  Net          1776.83 ml     A / P:   PPD #0, s/p C/S 2/2 PEC w/ severe features and NRFHT  PEC - normotensive since delivery, no neural s/s, labs grosly normal except small elevation in AST  - rpt PEC labs in AM  - continue Mag sulfate x 24 hrs  - strict I&O  - routine post-op care  Klare Criss, CNM, MSN  12/10/2015 4:21 PM

## 2015-12-11 LAB — COMPREHENSIVE METABOLIC PANEL
ALK PHOS: 120 U/L (ref 38–126)
ALT: 35 U/L (ref 14–54)
AST: 38 U/L (ref 15–41)
Albumin: 2.6 g/dL — ABNORMAL LOW (ref 3.5–5.0)
Anion gap: 6 (ref 5–15)
BUN: 22 mg/dL — ABNORMAL HIGH (ref 6–20)
CALCIUM: 6.3 mg/dL — AB (ref 8.9–10.3)
CHLORIDE: 102 mmol/L (ref 101–111)
CO2: 22 mmol/L (ref 22–32)
CREATININE: 0.88 mg/dL (ref 0.44–1.00)
Glucose, Bld: 130 mg/dL — ABNORMAL HIGH (ref 65–99)
Potassium: 4.5 mmol/L (ref 3.5–5.1)
Sodium: 130 mmol/L — ABNORMAL LOW (ref 135–145)
TOTAL PROTEIN: 5.5 g/dL — AB (ref 6.5–8.1)
Total Bilirubin: 0.4 mg/dL (ref 0.3–1.2)

## 2015-12-11 LAB — CBC
HCT: 32.3 % — ABNORMAL LOW (ref 36.0–46.0)
Hemoglobin: 11.4 g/dL — ABNORMAL LOW (ref 12.0–15.0)
MCH: 31.4 pg (ref 26.0–34.0)
MCHC: 35.3 g/dL (ref 30.0–36.0)
MCV: 89 fL (ref 78.0–100.0)
PLATELETS: 164 10*3/uL (ref 150–400)
RBC: 3.63 MIL/uL — AB (ref 3.87–5.11)
RDW: 13.1 % (ref 11.5–15.5)
WBC: 22.2 10*3/uL — ABNORMAL HIGH (ref 4.0–10.5)

## 2015-12-11 LAB — MAGNESIUM: MAGNESIUM: 6.6 mg/dL — AB (ref 1.7–2.4)

## 2015-12-11 NOTE — Progress Notes (Signed)
Pt. Complained of blurred vision. BP 138/85 at 1505. Dr. Ronita Hipps notified of pt's status. No new orders.

## 2015-12-11 NOTE — Lactation Note (Signed)
This note was copied from a baby's chart. Lactation Consultation Note  Patient Name: Girl Zola Burkel [id number] Date: 12/11/2015 Reason for consult: Follow-up assessment;NICU baby  NICU baby 21 hours old. Mom states that she is pumping every 2-3 hours. Discussed how to sleep at night and also pump 8 times/24 hours followed by hand expression. Mom had lots of questions about nursing/pumping and putting baby to breast in NICU--which were answered. Mom given additional colostrum containers and larger bottles. Discussed EBM storage guidelines and how to transport milk back to the hospital. Mom aware of OP/BFSG and [place] phone line assistance after D/C. Discussed the advantages of a hospital-grade pump and mom will want a 2-week rental. Mom has a personal pump already.  Maternal Data    Feeding Feeding Type: Formula Nipple Type: Slow - flow Length of feed: 5 min  LATCH Score/Interventions                      Lactation Tools Discussed/Used     Consult Status Consult Status: Follow-up Date: 12/12/15 Follow-up type: In-patient    Andres Labrum 12/11/2015, 7:26 PM

## 2015-12-11 NOTE — Progress Notes (Addendum)
Patient ID: Tara White, female   DOB: 1990/12/31, 25 y.o.   MRN: [id number] Subjective: S/P Primary Cesarean Delivery for Severe PEC / Non-Reassuring FHR  POD# 1 Information for the patient's newborn:  Kenly, Deluise [id number]  female "Adron Bene" in NICU  Reports feeling very tired, but well. Feeding: bottle Patient reports tolerating PO.  Breast symptoms: pumping and hand expressing - only small amt of colostrum Pain controlled with ibuprofen (OTC) Denies HA/SOB/C/P/N/V/dizziness. Flatus absent. No BM. She reports vaginal bleeding as normal, without clots.  She is ambulating, urinating without difficult.     Objective:   VS:  Vitals:   12/11/15 0500 12/11/15 0600 12/11/15 0657 12/11/15 1000  BP: 119/73 126/73 133/81 132/88  Pulse: (!) 59 61 (!) 50 (!) 52  Resp: 16 18 16 18   Temp:  97.7 F (36.5 C)  98.1 F (36.7 C)  TempSrc:  Oral  Oral  SpO2: 100% 100% 100% 100%  Weight:  84.4 kg (186 lb)    Height:         Intake/Output Summary (Last 24 hours) at 12/11/15 1133 Last data filed at 12/11/15 0700  Gross per 24 hour  Intake          3878.33 ml  Output             3050 ml  Net           828.33 ml        Recent Labs  12/10/15 0612 12/11/15 0501  WBC 17.5* 22.2*  HGB 13.6 11.4*  HCT 37.9 32.3*  PLT 192 164     Blood type: --/--/O POS (09/12 1908)  Rubella: Equivocal (04/17 0000)     Physical Exam:   General: alert, cooperative, fatigued and no distress  CV: Regular rate and rhythm, S1S2 present or without murmur or extra heart sounds  Resp: clear  Abdomen: soft, nontender, normal bowel sounds  Incision: dried serous drainage present on 1/3 of Honeycomb dressing and skin well-approximated with sutures  Uterine Fundus: firm, 1 FB below umbilicus, nontender  Lochia: minimal  Ext: edema trace and Homans sign is negative, no sign of DVT   Assessment/Plan: 25 y.o.   POD# 1.  S/P Cesarean Delivery.  Indications: severe PEC / Non-reassuring FHR                 Principal Problem:   Postpartum care following cesarean delivery (9/13) Active Problems:   Preeclampsia, severe  Doing well, stable.               Regular diet as tolerated D/C IV per unit protocol Change HC dsg Ambulate 2-3x/day Routine post-op care  Graceann Congress, MSN, CNM 12/11/2015, 11:33 AM

## 2015-12-11 NOTE — Progress Notes (Signed)
CRITICAL VALUE ALERT  Critical value received:  Calcium 6.3,                                        Magnesium 6.6    Date of notification: 12/11/15  Time of notification:  0630  Critical value read back:yes  Nurse who received alert: D. Berdine Addison, RN  MD notified (1st page): [phone]  Time of first page:0626  MD notified (2nd page):na  Time of second page:na  Responding MD:  Dr Dellis Filbert  Time MD responded: [phone]

## 2015-12-11 NOTE — Progress Notes (Signed)
CSW acknowledges NICU admission.    Patient screened out for psychosocial assessment since none of the following apply:  Psychosocial stressors documented in mother or baby's chart  Gestation less than 32 weeks  Code at delivery   Infant with anomalies  LCSW has reviewed chart and will be available for MOB during NICU admission.  Will introduce self and services if needs arise.  At this time, no CSW intervention needed per notes from admission and prenatal.   Please contact the Clinical Social Worker if specific needs arise, or by MOB's request.    Lane Hacker, MSW Clinical Social Work: [employer] for [employer] social worker [phone]

## 2015-12-12 LAB — COMPREHENSIVE METABOLIC PANEL
ALT: 31 U/L (ref 14–54)
AST: 35 U/L (ref 15–41)
Albumin: 2.5 g/dL — ABNORMAL LOW (ref 3.5–5.0)
Alkaline Phosphatase: 112 U/L (ref 38–126)
Anion gap: 5 (ref 5–15)
BUN: 20 mg/dL (ref 6–20)
CO2: 23 mmol/L (ref 22–32)
Calcium: 8.1 mg/dL — ABNORMAL LOW (ref 8.9–10.3)
Chloride: 108 mmol/L (ref 101–111)
Creatinine, Ser: 0.71 mg/dL (ref 0.44–1.00)
GFR calc Af Amer: >60 mL/min (ref >60)
GFR calc non Af Amer: >60 mL/min (ref >60)
Glucose, Bld: 71 mg/dL (ref 65–99)
Potassium: 4.5 mmol/L (ref 3.5–5.1)
Sodium: 136 mmol/L (ref 135–145)
Total Bilirubin: 0.5 mg/dL (ref 0.3–1.2)
Total Protein: 5.5 g/dL — ABNORMAL LOW (ref 6.5–8.1)

## 2015-12-12 LAB — CBC
HCT: 32.5 % — ABNORMAL LOW (ref 36.0–46.0)
Hemoglobin: 11.2 g/dL — ABNORMAL LOW (ref 12.0–15.0)
MCH: 31.3 pg (ref 26.0–34.0)
MCHC: 34.5 g/dL (ref 30.0–36.0)
MCV: 90.8 fL (ref 78.0–100.0)
Platelets: 190 10*3/uL (ref 150–400)
RBC: 3.58 MIL/uL — ABNORMAL LOW (ref 3.87–5.11)
RDW: 13.7 % (ref 11.5–15.5)
WBC: 17.8 10*3/uL — ABNORMAL HIGH (ref 4.0–10.5)

## 2015-12-12 MED ORDER — INFLUENZA VAC SPLIT QUAD 0.5 ML IM SUSY
0.5000 mL | PREFILLED_SYRINGE | INTRAMUSCULAR | Status: DC
  Filled 2015-12-12: qty 0.5

## 2015-12-12 MED ORDER — MEASLES, MUMPS & RUBELLA VAC ~~LOC~~ INJ
0.5000 mL | INJECTION | Freq: Once | SUBCUTANEOUS | Status: AC
  Administered 2015-12-13: 0.5 mL via SUBCUTANEOUS
  Filled 2015-12-12: qty 0.5

## 2015-12-12 NOTE — Progress Notes (Signed)
Scopolamine patch has been removed since shower at 0700, area wiped with ETOH.

## 2015-12-12 NOTE — Progress Notes (Signed)
Paged on-call MD in regards to blurred near vison, Na 130, WBC 22.2 and 2-3+ edema to lowers.

## 2015-12-12 NOTE — Progress Notes (Signed)
Midwife at bedside

## 2015-12-12 NOTE — Progress Notes (Signed)
Made contact with CMW, new orders forthcoming.

## 2015-12-12 NOTE — Progress Notes (Signed)
POSTOPERATIVE DAY # 2 S/P CS   S:         Reports feeling dizzy and little blurry vision             Tolerating po intake / no nausea / no vomiting / + flatus / no BM             Bleeding is light             Pain controlled with motrin             Up ad lib / ambulatory/ voiding QS  Newborn stable in NICU - plans breast feeding   O:  VS: BP (!) 147/81 (BP Location: Left Arm)   Pulse (!) 57   Temp 99.1 F (37.3 C) (Oral)   Resp 18   Ht 5\' 9"  (1.753 m)   Wt 83.2 kg (183 lb 7 oz)   LMP 04/12/2015 (Exact Date)   SpO2 100%   Breastfeeding? Unknown   BMI 27.09 kg/m    BP: 141/87- 147/81- 128/70   LABS:               Recent Labs  12/11/15 0501 12/12/15 1304  WBC 22.2* 17.8*  HGB 11.4* 11.2*  PLT 164 190  Results for Tara, White (MRN [id number]) as of 12/12/2015 15:23  Ref. Range 12/12/2015 13:04  Sodium Latest Ref Range: 135 - 145 mmol/L 136  Potassium Latest Ref Range: 3.5 - 5.1 mmol/L 4.5  Chloride Latest Ref Range: 101 - 111 mmol/L 108  CO2 Latest Ref Range: 22 - 32 mmol/L 23  BUN Latest Ref Range: 6 - 20 mg/dL 20  Creatinine Latest Ref Range: 0.44 - 1.00 mg/dL 0.71  Calcium Latest Ref Range: 8.9 - 10.3 mg/dL 8.1 (L)  Glucose Latest Ref Range: 65 - 99 mg/dL 71  Anion gap Latest Ref Range: 5 - 15  5  Alkaline Phosphatase Latest Ref Range: 38 - 126 U/L 112  Albumin Latest Ref Range: 3.5 - 5.0 g/dL 2.5 (L)  AST Latest Ref Range: 15 - 41 U/L 35  ALT Latest Ref Range: 14 - 54 U/L 31  Total Protein Latest Ref Range: 6.5 - 8.1 g/dL 5.5 (L)  Total Bilirubin Latest Ref Range: 0.3 - 1.2 mg/dL 0.5  WBC Latest Ref Range: 4.0 - 10.5 K/uL 17.8 (H)  RBC Latest Ref Range: 3.87 - 5.11 MIL/uL 3.58 (L)  Hemoglobin Latest Ref Range: 12.0 - 15.0 g/dL 11.2 (L)  HCT Latest Ref Range: 36.0 - 46.0 % 32.5 (L)  Platelets Latest Ref Range: 150 - 400 K/uL 190               Bloodtype: --/--/O POS (09/12 1908)  Rubella: Equivocal (04/17 0000)                                             I&O:  net negative 151             Physical Exam:             Alert and Oriented X3  Lungs: Clear and unlabored  Heart: regular rate and rhythm / no mumurs  Abdomen: soft, non-tender, non-distended, active BS             Fundus: firm, non-tender, U-2             Dressing intact  Incision:  no erythema / no ecchymosis / no drainage  Perineum: intact  Lochia: light  Extremities: 2+ edema, no calf pain or tenderness, negative Homans  A:        POD # 2 S/P CS            Severe preeclampsia - labile hypertension with dependent edema   P:        Routine postoperative care              HCTZ 25 today then 12.5 x 5 days             [place], Roniqua Kintz CNM, MSN, [facility] 12/12/2015, 3:22 PM

## 2015-12-12 NOTE — Lactation Note (Signed)
This note was copied from a baby's chart. Lactation Consultation Note  Patient Name: Tara White [id number] Date: 12/12/2015 Reason for consult: Follow-up assessment    with this mom of a NICU baby, now 61 hours old, and 34 6/7 week CGA. Mom's milk is transitioning in. I advised her to pump in maintenance setting now, and to add hand expression with each pumping. I told mom to try and pump at night now, if feeing well enough, and to go no more than 4-5 hours without pumping, and to pump if her breast fullness wakes her.  LPI behavior reviewed with mom, and I explained that for now now she can nuzzle the baby at the breast, but to not worry about whether she can transfer milk. I explained why with her gestation and size it is unlikely she will transfer much, and how with time she will do better. O?P consults explained, and how lactation will still be available to her after baby goes home. I advised mom to pump after she visit the baby, since her hormones will be higher at this time. With hand expression, mom had a steady stream of milk this morning. Mom was also advised to add coconut oil to lubricate her nipple with pumping. Her skin on her nipples is intact, but a little red and tender. Mom knows to call for questions/concerns.    Maternal Data    Feeding Feeding Type: Formula Nipple Type: Slow - flow Length of feed: 5 min  LATCH Score/Interventions          Comfort (Breast/Nipple): Filling, red/small blisters or bruises, mild/mod discomfort  Problem noted: Filling;Mild/Moderate discomfort Interventions (Filling): Hand pump;Double electric pump (red, tender nippl;es, coconut oil added to nipples with pumping)        Lactation Tools Discussed/Used     Consult Status Consult Status: Follow-up Date: 12/13/15 Follow-up type: In-patient    Tonna Corner 12/12/2015, 9:29 AM

## 2015-12-13 MED ORDER — OXYCODONE HCL 10 MG PO TABS: 5.0000 mg | ORAL_TABLET | ORAL | 0 refills | Status: DC | PRN

## 2015-12-13 MED ORDER — SIMETHICONE 80 MG PO CHEW: 80.0000 mg | CHEWABLE_TABLET | Freq: Three times a day (TID) | ORAL | 0 refills | Status: DC

## 2015-12-13 MED ORDER — IBUPROFEN 600 MG PO TABS: 600.0000 mg | ORAL_TABLET | Freq: Four times a day (QID) | ORAL | 0 refills | Status: DC

## 2015-12-13 MED ORDER — SENNOSIDES-DOCUSATE SODIUM 8.6-50 MG PO TABS
2.0000 | ORAL_TABLET | ORAL | Status: DC
Start: 2015-12-14 — End: 2021-04-15

## 2015-12-13 MED ORDER — COCONUT OIL OIL: 1.0000 "application " | TOPICAL_OIL | 0 refills | Status: DC | PRN

## 2015-12-13 NOTE — Discharge Instructions (Signed)
Breast Pumping Tips °If you are breastfeeding, there may be times when you cannot feed your baby directly. Returning to work or going on a trip are common examples. Pumping allows you to store breast milk and feed it to your baby later.  °You may not get much milk when you first start to pump. Your breasts should start to make more after a few days. If you pump at the times you usually feed your baby, you may be able to keep making enough milk to feed your baby without also using formula. The more often you pump, the more milk you will produce.  °WHEN SHOULD I PUMP?  °· You can begin to pump soon after delivery. However, some experts recommend waiting about 4 weeks before giving your infant a bottle to make sure breastfeeding is going well.  °· If you plan to return to work, begin pumping a few weeks before. This will help you develop techniques that work best for you. It also lets you build up a supply of breast milk.   °· When you are with your infant, feed on demand and pump after each feeding.   °· When you are away from your infant for several hours, pump for about 15 minutes every 2-3 hours. Pump both breasts at the same time if you can.   °· If your infant has a formula feeding, make sure to pump around the same time.     °· If you drink any alcohol, wait 2 hours before pumping.   °HOW DO I PREPARE TO PUMP? °Your let-down reflex is the natural reaction to stimulation that makes your breast milk flow. It is easier to stimulate this reflex when you are relaxed. Find relaxation techniques that work for you. If you have difficulty with your let-down reflex, try these methods:  °· Smell one of your infant's blankets or an item of clothing.   °· Look at a picture or video of your infant.   °· Sit in a quiet, private space.   °· Massage the breast you plan to pump.   °· Place soothing warmth on the breast.   °· Play relaxing music.   °WHAT ARE SOME GENERAL BREAST PUMPING TIPS? °· Wash your hands before you pump. You  do not need to wash your nipples or breasts. °· There are three ways to pump. °¨ You can use your hand to massage and compress your breast. °¨ You can use a handheld manual pump. °¨ You can use an electric pump.   °· Make sure the suction cup (flange) on the breast pump is the right size. Place the flange directly over the nipple. If it is the wrong size or placed the wrong way, it may be painful and cause nipple damage.   °· If pumping is uncomfortable, apply a small amount of purified or modified lanolin to your nipple and areola. °· If you are using an electric pump, adjust the speed and suction power to be more comfortable. °· If pumping is painful or if you are not getting very much milk, you may need a different type of pump. A lactation consultant can help you determine what type of pump to use.   °· Keep a full water bottle near you at all times. Drinking lots of fluid helps you make more milk.  °· You can store your milk to use later. Pumped breast milk can be stored in a sealable, sterile container or plastic bag. Label all stored breast milk with the date you pumped it. °¨ Milk can stay out at room temperature for up to 8 hours. °¨   You can store your milk in the refrigerator for up to 8 days.  You can store your milk in the freezer for 3 months. Thaw frozen milk using warm water. Do not put it in the microwave.  Do not smoke. Smoking can lower your milk supply and harm your infant. If you need help quitting, ask your health care provider to recommend a program.  Powers A LACTATION CONSULTANT?  You are having trouble pumping.  You are concerned that you are not making enough milk.  You have nipple pain, soreness, or redness.  You want to use birth control. Birth control pills may lower your milk supply. Talk to your health care provider about your options.   This information is not intended to replace advice given to you by your health care provider.  Make sure you discuss any questions you have with your health care provider.   Document Released: 09/02/2009 Document Revised: 03/20/2013 Document Reviewed: 01/05/2013 Elsevier Interactive Patient Education [employer].

## 2015-12-13 NOTE — Lactation Note (Signed)
This note was copied from a baby's chart. Lactation Consultation Note  Patient Name: Tara White [id number] Date: 12/13/2015 Reason for consult: Follow-up assessment;NICU baby Mom reports some nipple tenderness with pumping, no breakdown noted. Applying coconut oil prior to pumping and applying EBM. Checked flange size 24 fits well on left breast, 27 on right but Mom thinks she gets more milk with 24. Advised to try 27 flanges on both breasts for next few pumpings, if no improvement in discomfort and not getting as much EBM then continue to use 24 flange. No blanching with 24 flanges on either breast, would not have changed to 27 except for Mom experiencing discomfort. Mom does report having sensitive breast. Comfort gels given with instructions. Advised to pump every 3 hours for 15-20 minutes.  Mom getting approx 10 ml with pumping now. Storage guidelines discussed. Engorgement care reviewed if needed. Advised of OP services.   Maternal Data    Feeding Feeding Type: Breast Milk Nipple Type: Slow - flow Length of feed: 30 min  LATCH Score/Interventions                      Lactation Tools Discussed/Used Tools: Pump Breast pump type: Double-Electric Breast Pump   Consult Status Consult Status: Complete Date: 12/13/15 Follow-up type: In-patient    Katrine Coho 12/13/2015, 1:06 PM

## 2015-12-13 NOTE — Progress Notes (Signed)
T. Is discharged in the care of husband. Understands all discharged instructions well Questionasked and answered. Abdominal dressing clean and dry. Stable. Discharged per ambulatory. Infant to remain in [facility].

## 2015-12-13 NOTE — Discharge Summary (Signed)
OB Discharge Summary     Patient Name: Tara White DOB: June 01, 1990 MRN: [id number]  Date of admission: 12/09/2015 Delivering MD: Aloha Gell   Date of discharge: 12/13/2015  Admitting diagnosis: 34w Induction of labor, pre eclampsia/severe features Intrauterine pregnancy: [redacted]w[redacted]d     Secondary diagnosis:  Principal Problem:   Postpartum care following cesarean delivery (9/13) Active Problems:   Preeclampsia, severe     Discharge diagnosis: Preterm Pregnancy Delivered, Preeclampsia (severe/resolving) and post-op day 3, stable                                                                                                Post partum procedures:Magnesium Sulfate therapy x 24 hrs  Augmentation: Cytotec  Complications: None  Hospital course:  Induction of Labor With Cesarean Section  25 y.o. yo G1P0101 at [redacted]w[redacted]d was admitted to the hospital 12/09/2015 for induction of labor. Patient had a labor course significant for pre-eclampisa with severe range blood pressure. The patient went for cesarean section due to Non-Reassuring FHR, and delivered a Viable infant, female. Membrane Rupture Time/Date: )9:21 AM ,12/10/2015  Details of operation can be found in separate operative Note. Magnesium Sulfate maintained x 24 hours post-op. BP remains in mild range at discharge with mild dependent edema present, and adequate diuresis. Pre-eclampsia labs remained grossly normal throughout hospital stay.  Patient had an uncomplicated postpartum course. She is ambulating, tolerating a regular diet, passing flatus, and urinating well.  Patient is discharged home in stable condition on 12/13/15.                                   MD addendum: Pt admitted for evaluation of new onset hypertension. IV meds needed to control bp's and labs (AST,ALT, UA and Cr) all with abnormalities. BMZ was given, bp was controlled and induction was decided upon. C/s for non-reasurring fetal testing as above. On review of bp's, elevated  bp's were still noted on day of discharge. Pt was seen for recheck several days after d/c and readmitted for bp control.   Physical exam Vitals:   12/12/15 2209 12/13/15 0535 12/13/15 0635 12/13/15 1013  BP: (!) 146/72  (!) 166/88 (!) 142/84  Pulse: (!) 58  78 69  Resp: 18  18 18   Temp: 98.7 F (37.1 C)  98.5 F (36.9 C) 98.2 F (36.8 C)  TempSrc: Oral  Oral Oral  SpO2: 100%  100%   Weight:  81.6 kg (180 lb)    Height:       General: alert, cooperative and no distress Lochia: appropriate Uterine Fundus: firm Incision: Healing well with no significant drainage DVT Evaluation: No cords or calf tenderness. Calf/Ankle edema is present Labs: Lab Results  Component Value Date   WBC 17.8 (H) 12/12/2015   HGB 11.2 (L) 12/12/2015   HCT 32.5 (L) 12/12/2015   MCV 90.8 12/12/2015   PLT 190 12/12/2015   CMP Latest Ref Rng & Units 12/12/2015  Glucose 65 - 99 mg/dL 71  BUN 6 - 20 mg/dL 20  Creatinine  0.44 - 1.00 mg/dL 0.71  Sodium 135 - 145 mmol/L 136  Potassium 3.5 - 5.1 mmol/L 4.5  Chloride 101 - 111 mmol/L 108  CO2 22 - 32 mmol/L 23  Calcium 8.9 - 10.3 mg/dL 8.1(L)  Total Protein 6.5 - 8.1 g/dL 5.5(L)  Total Bilirubin 0.3 - 1.2 mg/dL 0.5  Alkaline Phos 38 - 126 U/L 112  AST 15 - 41 U/L 35  ALT 14 - 54 U/L 31    Discharge instruction: per After Visit Summary and "Baby and Me Booklet".  After visit meds:    Medication List    TAKE these medications   coconut oil Oil Apply 1 application topically as needed.   ibuprofen 600 MG tablet Commonly known as:  ADVIL,MOTRIN Take 1 tablet (600 mg total) by mouth every 6 (six) hours.   Oxycodone HCl 10 MG Tabs Take 0.5 tablets (5 mg total) by mouth every 4 (four) hours as needed for moderate pain (pain scale > 7).   prenatal multivitamin Tabs tablet Take 1 tablet by mouth daily at 12 noon.   senna-docusate 8.6-50 MG tablet Commonly known as:  Senokot-S Take 2 tablets by mouth daily. Start taking on:  12/14/2015    simethicone 80 MG chewable tablet Commonly known as:  MYLICON Chew 1 tablet (80 mg total) by mouth 3 (three) times daily after meals.       Diet: routine diet  Activity: Advance as tolerated. Pelvic rest for 6 weeks.   Outpatient follow up:1 week postpartum for blood pressure check Follow up Appt:No future appointments. Follow up Visit:No Follow-up on file.  Postpartum contraception: Not Discussed  Newborn Data: Live born female "Karlene Einstein" Birth Weight: 3 lb 9.9 oz (1640 g) APGAR: 8, 10  Baby Feeding: Breast Disposition:NICU   12/13/2015 Juliene Pina, CNM

## 2015-12-13 NOTE — Progress Notes (Signed)
Subjective: POD# 3 Information for the patient's newborn:  Kindel, Rochefort [[id number]]  female   Baby name: Karlene Einstein NICU admit for prematurity  Reports feeling OK, was emotional and crying this morning but better now.  Feeding: breast, pumping and feeding. Patient reports tolerating PO.  Breast symptoms: colostrum present Pain controlled withibuprofen (OTC) Denies HA/SOB/C/P/N/V/dizziness. Flatus present. She reports vaginal bleeding as normal, without clots.  She is ambulating, urinating without difficulty.    Denies HA / NV / viasula changes Objective:   VS:    Vitals:   12/12/15 2209 12/13/15 0535 12/13/15 0635 12/13/15 1013  BP: (!) 146/72  (!) 166/88 (!) 142/84  Pulse: (!) 58  78 69  Resp: 18  18 18   Temp: 98.7 F (37.1 C)  98.5 F (36.9 C) 98.2 F (36.8 C)  TempSrc: Oral  Oral Oral  SpO2: 100%  100%   Weight:  81.6 kg (180 lb)    Height:        No intake or output data in the 24 hours ending 12/13/15 1114      Recent Labs  12/11/15 0501 12/12/15 1304  WBC 22.2* 17.8*  HGB 11.4* 11.2*  HCT 32.3* 32.5*  PLT 164 190     Blood type: --/--/O POS (09/12 1908)  Rubella: Equivocal (04/17 0000)     Physical Exam:  General: alert, cooperative and no distress CV: Regular rate and rhythm Resp: clear Abdomen: soft, nontender, normal bowel sounds Incision: clean, dry, intact and .HCD Uterine Fundus: firm, below umbilicus, nontender Lochia: minimal Ext: edema +1 pedal      Assessment/Plan: 25 y.o.   POD# 3. [id number]                  Principal Problem:   Postpartum care following cesarean delivery (9/13) Active Problems:   Preeclampsia, severe  - resolving, BP labile, mild range mostly  - labs grossly wnl  Doing well, stable.    F/U 1 weeks at [facility] for BP check PEC precautions discussed MMR vaccine prior to DC PPD s/s to report and self care discussed at length DC home w/ precautions NB remains inpatient NICU  Juliene Pina, CNM, MSN 12/13/2015,  11:14 AM

## 2015-12-15 ENCOUNTER — Ambulatory Visit: Payer: Self-pay

## 2015-12-15 ENCOUNTER — Observation Stay (HOSPITAL_COMMUNITY)
Admission: AD | Admit: 2015-12-15 | Discharge: 2015-12-17 | DRG: 776 | Disposition: A | Discharge status: Home / Self Care | Payer: Managed Care, Other (non HMO) | Source: Ambulatory Visit | Attending: Obstetrics & Gynecology | Admitting: Obstetrics & Gynecology

## 2015-12-15 DIAGNOSIS — O1495 Unspecified pre-eclampsia, complicating the puerperium: Secondary | ICD-10-CM | POA: Diagnosis not present

## 2015-12-15 DIAGNOSIS — R1010 Upper abdominal pain, unspecified: Secondary | ICD-10-CM | POA: Diagnosis present

## 2015-12-15 DIAGNOSIS — O1415 Severe pre-eclampsia, complicating the puerperium: Secondary | ICD-10-CM | POA: Diagnosis present

## 2015-12-15 DIAGNOSIS — O1490 Unspecified pre-eclampsia, unspecified trimester: Secondary | ICD-10-CM | POA: Diagnosis present

## 2015-12-15 HISTORY — DX: Gestational (pregnancy-induced) hypertension without significant proteinuria, unspecified trimester: O13.9

## 2015-12-15 LAB — CBC
HEMATOCRIT: 33.8 % — AB (ref 36.0–46.0)
Hemoglobin: 11.8 g/dL — ABNORMAL LOW (ref 12.0–15.0)
MCH: 31.2 pg (ref 26.0–34.0)
MCHC: 34.9 g/dL (ref 30.0–36.0)
MCV: 89.4 fL (ref 78.0–100.0)
Platelets: 268 10*3/uL (ref 150–400)
RBC: 3.78 MIL/uL — ABNORMAL LOW (ref 3.87–5.11)
RDW: 13.1 % (ref 11.5–15.5)
WBC: 12 10*3/uL — ABNORMAL HIGH (ref 4.0–10.5)

## 2015-12-15 MED ORDER — LACTATED RINGERS IV SOLN
INTRAVENOUS | Status: DC
  Administered 2015-12-15: via INTRAVENOUS

## 2015-12-15 MED ORDER — HYDRALAZINE HCL 20 MG/ML IJ SOLN
10.0000 mg | Freq: Once | INTRAMUSCULAR | Status: AC | PRN
  Administered 2015-12-16: 10 mg via INTRAVENOUS
  Filled 2015-12-15: qty 1

## 2015-12-15 MED ORDER — LABETALOL HCL 5 MG/ML IV SOLN
20.0000 mg | INTRAVENOUS | Status: DC | PRN
  Administered 2015-12-15: 20 mg via INTRAVENOUS
  Filled 2015-12-15: qty 4

## 2015-12-15 NOTE — MAU Provider Note (Signed)
History     CSN: [id number]  Arrival date and time: 12/15/15 2229   First Provider Initiated Contact with Patient 12/15/15 2339      Chief Complaint  Patient presents with  . Postpartum Complications  . Hypertension   Ruweyda Kaas is a 25 y.o. G1P0101 who is S/P C-S on 12/10/14. She was delivered because of pre-eclampsia, and was on magnesium. She was not sent home on any blood pressure medications. She states that at DC home her blood pressures were 140/90s. She states that she took her blood pressure at home today. She states that she had some upper abdominal pain and "chest tightness" today, and she has not had that since prior to the delivery. She denies any HA, visual disturbances or RUQ pain. Next appointment: 12/19/15    Hypertension  This is a new problem. The current episode started today. The problem is unchanged. Condition status: Not currently on any medication. Pertinent negatives include no blurred vision, chest pain or headaches. Associated agents: Postpartum    No past medical history on file.  Past Surgical History:  Procedure Laterality Date  . CESAREAN SECTION N/A 12/10/2015   Procedure: CESAREAN SECTION;  Surgeon: Aloha Gell, MD;  Location: [facility];  Service: Obstetrics;  Laterality: N/A;  . WISDOM TOOTH EXTRACTION      Family History  Problem Relation Age of Onset  . Hypothyroidism Mother   . Heart failure Paternal Grandfather     Social History  Substance Use Topics  . Smoking status: Never Smoker  . Smokeless tobacco: Never Used  . Alcohol use 1.2 oz/week    2 Standard drinks or equivalent per week    Allergies: No Known Allergies  Prescriptions Prior to Admission  Medication Sig Dispense Refill Last Dose  . coconut oil OIL Apply 1 application topically as needed.  0   . ibuprofen (ADVIL,MOTRIN) 600 MG tablet Take 1 tablet (600 mg total) by mouth every 6 (six) hours. 30 tablet 0   . oxyCODONE 10 MG TABS Take 0.5 tablets (5 mg total)  by mouth every 4 (four) hours as needed for moderate pain (pain scale > 7). 30 tablet 0   . Prenatal Vit-Fe Fumarate-FA (PRENATAL MULTIVITAMIN) TABS tablet Take 1 tablet by mouth daily at 12 noon.   12/09/2015 at Unknown time  . senna-docusate (SENOKOT-S) 8.6-50 MG tablet Take 2 tablets by mouth daily.     . simethicone (MYLICON) 80 MG chewable tablet Chew 1 tablet (80 mg total) by mouth 3 (three) times daily after meals. 30 tablet 0     Review of Systems  Constitutional: Negative for chills and fever.  Eyes: Negative for blurred vision.  Cardiovascular: Negative for chest pain.  Gastrointestinal: Negative for abdominal pain, nausea and vomiting.  Neurological: Negative for headaches.   Physical Exam   Blood pressure (!) 160/101, pulse (!) 55, temperature 97.9 F (36.6 C), temperature source Oral, resp. rate 18, height 5\' 9"  (1.753 m), weight 175 lb (79.4 kg), last menstrual period 04/12/2015, SpO2 100 %, unknown if currently breastfeeding.  Physical Exam  Nursing note and vitals reviewed. Constitutional: She is oriented to person, place, and time. She appears well-developed and well-nourished. No distress.  HENT:  Head: Normocephalic.  Cardiovascular: Normal rate.   Respiratory: Effort normal.  GI: Soft. There is no tenderness.  Musculoskeletal: She exhibits edema (pedal edema bilaterally ).  Neurological: She is alert and oriented to person, place, and time.  Skin: Skin is warm and dry.  Psychiatric: She  has a normal mood and affect.   Results for orders placed or performed during the hospital encounter of 12/15/15 (from the past 24 hour(s))  CBC     Status: Abnormal   Collection Time: 12/15/15 11:45 PM  Result Value Ref Range   WBC 12.0 (H) 4.0 - 10.5 K/uL   RBC 3.78 (L) 3.87 - 5.11 MIL/uL   Hemoglobin 11.8 (L) 12.0 - 15.0 g/dL   HCT 33.8 (L) 36.0 - 46.0 %   MCV 89.4 78.0 - 100.0 fL   MCH 31.2 26.0 - 34.0 pg   MCHC 34.9 30.0 - 36.0 g/dL   RDW 13.1 11.5 - 15.5 %    Platelets 268 150 - 400 K/uL  Comprehensive metabolic panel     Status: Abnormal   Collection Time: 12/15/15 11:45 PM  Result Value Ref Range   Sodium 135 135 - 145 mmol/L   Potassium 4.4 3.5 - 5.1 mmol/L   Chloride 106 101 - 111 mmol/L   CO2 23 22 - 32 mmol/L   Glucose, Bld 96 65 - 99 mg/dL   BUN 15 6 - 20 mg/dL   Creatinine, Ser 0.68 0.44 - 1.00 mg/dL   Calcium 9.2 8.9 - 10.3 mg/dL   Total Protein 6.1 (L) 6.5 - 8.1 g/dL   Albumin 2.9 (L) 3.5 - 5.0 g/dL   AST 31 15 - 41 U/L   ALT 45 14 - 54 U/L   Alkaline Phosphatase 101 38 - 126 U/L   Total Bilirubin 0.6 0.3 - 1.2 mg/dL   GFR calc non Af Amer >60 >60 mL/min   GFR calc Af Amer >60 >60 mL/min   Anion gap 6 5 - 15  Urinalysis, Routine w reflex microscopic (not at [facility])     Status: Abnormal   Collection Time: 12/16/15 12:08 AM  Result Value Ref Range   Color, Urine YELLOW YELLOW   APPearance CLEAR CLEAR   Specific Gravity, Urine 1.010 1.005 - 1.030   pH 7.0 5.0 - 8.0   Glucose, UA NEGATIVE NEGATIVE mg/dL   Hgb urine dipstick SMALL (A) NEGATIVE   Bilirubin Urine NEGATIVE NEGATIVE   Ketones, ur NEGATIVE NEGATIVE mg/dL   Protein, ur 30 (A) NEGATIVE mg/dL   Nitrite NEGATIVE NEGATIVE   Leukocytes, UA NEGATIVE NEGATIVE  Protein / creatinine ratio, urine     Status: Abnormal   Collection Time: 12/16/15 12:08 AM  Result Value Ref Range   Creatinine, Urine 28.00 mg/dL   Total Protein, Urine 43 mg/dL   Protein Creatinine Ratio 1.54 (H) 0.00 - 0.15 mg/mg[Cre]  Urine microscopic-add on     Status: Abnormal   Collection Time: 12/16/15 12:08 AM  Result Value Ref Range   Squamous Epithelial / LPF 0-5 (A) NONE SEEN   WBC, UA 0-5 0 - 5 WBC/hpf   RBC / HPF 0-5 0 - 5 RBC/hpf   Bacteria, UA RARE (A) NONE SEEN     MAU Course  Procedures  MDM 0115: D/W Dr. Benjie Karvonen will place in Observation and give PO Procardia 30mg  XL.   Assessment and Plan  Pre-eclampsia Severe range B/P  Place in OBS for b/p management   Mathis Bud 12/15/2015, 11:40 PM

## 2015-12-15 NOTE — MAU Note (Signed)
Pt had c/s on 12/10/2015. Had magnesium sulfate during stay for elevated BP. Was discharged home on Saturday. States that she checked BP at home 167/106. Denies HA, vision changes.

## 2015-12-15 NOTE — Lactation Note (Signed)
This note was copied from a baby's chart. Lactation Consultation Note  Patient Name: Tara White [id number] Date: 12/15/2015 Reason for consult: Follow-up assessment;Infant < 6lbs;Late preterm infant;NICU baby   Requested to assist mom with initial BF. Infant now 35w 2d CGA. Infant is now 63 days old. Mom reports she is pumping every 2 hours during the day and sleeping 8 hours at night. Mom is getting 20-30 cc/pumping. Her breasts are soft and compressible. Enc mom to power pump at night prior to sleeping for long stretch. Discussed with mom that her milk volume should increase more in the next few days and if not to talk to OB about using Fenugreek. Fenugreek handout given. Mom is using a PIS for home use and is using Symphony a few times a day when visiting infant. Mom is pumping post STS with infant. Enc mom to relax and cover pump with one of infants blankets while pumping to decrease anxiety.   Infant was awake and ready to feed. Assisted mom with pillow support and positioning infant to left breast in the cross cradle hold. Infant initially cried and then held nipple in her mouth. Mom was enc to compress breast to enc milk flow and infant latched. She suckled off and on for about 20 minutes with visible swallows. Showed mom how to distinguish swallows in infant. Mom was very excited infant fed at the breast. Discussed with mom what to expect with LPT infant at breast and advised mom to limit BF for 15-20 minutes and then offer supplement to infant to conserve calories. Mom voiced understanding. Follow up prn.      Maternal Data Formula Feeding for Exclusion: No Has patient been taught Hand Expression?: Yes Does the patient have breastfeeding experience prior to this delivery?: No  Feeding Feeding Type: Breast Fed Length of feed: 15 min  LATCH Score/Interventions Latch: Repeated attempts needed to sustain latch, nipple held in mouth throughout feeding, stimulation needed to elicit sucking  reflex. Intervention(s): Adjust position;Assist with latch;Breast massage;Breast compression  Audible Swallowing: Spontaneous and intermittent  Type of Nipple: Everted at rest and after stimulation  Comfort (Breast/Nipple): Filling, red/small blisters or bruises, mild/mod discomfort  Problem noted: Mild/Moderate discomfort Interventions (Mild/moderate discomfort): Comfort gels  Hold (Positioning): Assistance needed to correctly position infant at breast and maintain latch. Intervention(s): Breastfeeding basics reviewed;Support Pillows;Position options;Skin to skin  LATCH Score: 7  Lactation Tools Discussed/Used Pump Review: Setup, frequency, and cleaning Initiated by:: Reviewed   Consult Status Consult Status: PRN Follow-up type: Call as needed    Donn Pierini 12/15/2015, 1:33 PM

## 2015-12-16 ENCOUNTER — Encounter (HOSPITAL_COMMUNITY): Payer: Self-pay | Admitting: *Deleted

## 2015-12-16 DIAGNOSIS — O1415 Severe pre-eclampsia, complicating the puerperium: Secondary | ICD-10-CM | POA: Diagnosis present

## 2015-12-16 LAB — URINALYSIS, ROUTINE W REFLEX MICROSCOPIC
Bilirubin Urine: NEGATIVE
Glucose, UA: NEGATIVE mg/dL
Ketones, ur: NEGATIVE mg/dL
Leukocytes, UA: NEGATIVE
NITRITE: NEGATIVE
PH: 7 (ref 5.0–8.0)
Protein, ur: 30 mg/dL — AB
Specific Gravity, Urine: 1.01 (ref 1.005–1.030)

## 2015-12-16 LAB — PROTEIN / CREATININE RATIO, URINE
Creatinine, Urine: 28 mg/dL
PROTEIN CREATININE RATIO: 1.54 mg/mg{creat} — AB (ref 0.00–0.15)
TOTAL PROTEIN, URINE: 43 mg/dL

## 2015-12-16 LAB — COMPREHENSIVE METABOLIC PANEL
ALK PHOS: 101 U/L (ref 38–126)
ALT: 45 U/L (ref 14–54)
AST: 31 U/L (ref 15–41)
Albumin: 2.9 g/dL — ABNORMAL LOW (ref 3.5–5.0)
Anion gap: 6 (ref 5–15)
BUN: 15 mg/dL (ref 6–20)
CALCIUM: 9.2 mg/dL (ref 8.9–10.3)
CO2: 23 mmol/L (ref 22–32)
CREATININE: 0.68 mg/dL (ref 0.44–1.00)
Chloride: 106 mmol/L (ref 101–111)
Glucose, Bld: 96 mg/dL (ref 65–99)
Potassium: 4.4 mmol/L (ref 3.5–5.1)
Sodium: 135 mmol/L (ref 135–145)
Total Bilirubin: 0.6 mg/dL (ref 0.3–1.2)
Total Protein: 6.1 g/dL — ABNORMAL LOW (ref 6.5–8.1)

## 2015-12-16 LAB — URINE MICROSCOPIC-ADD ON

## 2015-12-16 MED ORDER — SENNOSIDES-DOCUSATE SODIUM 8.6-50 MG PO TABS
2.0000 | ORAL_TABLET | ORAL | Status: DC
  Filled 2015-12-16: qty 2

## 2015-12-16 MED ORDER — IBUPROFEN 600 MG PO TABS
600.0000 mg | ORAL_TABLET | Freq: Four times a day (QID) | ORAL | Status: DC
  Administered 2015-12-16: 600 mg via ORAL
  Filled 2015-12-16 (×3): qty 1

## 2015-12-16 MED ORDER — SODIUM CHLORIDE 0.9% FLUSH
3.0000 mL | Freq: Two times a day (BID) | INTRAVENOUS | Status: DC
  Administered 2015-12-16 (×2): 3 mL via INTRAVENOUS

## 2015-12-16 MED ORDER — SODIUM CHLORIDE 0.9% FLUSH: 3.0000 mL | INTRAVENOUS | Status: DC | PRN

## 2015-12-16 MED ORDER — SIMETHICONE 80 MG PO CHEW
80.0000 mg | CHEWABLE_TABLET | Freq: Three times a day (TID) | ORAL | Status: DC
  Filled 2015-12-16 (×2): qty 1

## 2015-12-16 MED ORDER — NIFEDIPINE ER 30 MG PO TB24
30.0000 mg | ORAL_TABLET | Freq: Once | ORAL | Status: AC
  Administered 2015-12-16: 30 mg via ORAL
  Filled 2015-12-16: qty 1

## 2015-12-16 MED ORDER — PRENATAL MULTIVITAMIN CH
1.0000 | ORAL_TABLET | Freq: Every day | ORAL | Status: DC
Start: 2015-12-16 — End: 2015-12-17
  Administered 2015-12-16 – 2015-12-17 (×2): 1 via ORAL
  Filled 2015-12-16 (×2): qty 1

## 2015-12-16 MED ORDER — SODIUM CHLORIDE 0.9 % IV SOLN: 250.0000 mL | INTRAVENOUS | Status: DC | PRN

## 2015-12-16 MED ORDER — OXYCODONE HCL 5 MG PO TABS
5.0000 mg | ORAL_TABLET | ORAL | Status: DC | PRN
  Filled 2015-12-16: qty 1

## 2015-12-16 NOTE — H&P (Signed)
Tara White is an 25 y.o. female presenting with upper abdominal pain and chest tightness.  G1P0101, Preterm C/section for fetal distress after induction for severe preeclampsia, delivered on 12/10/15, received magnesium sulphate postpartum, and discharged home on 12/13/15.  She states her blood pressures were 140/90s before discharge. She took her blood pressure at home today since she had upper abdominal pain and chest tightness today, and BP was in 160s range. She didn't have any antiHTN medication prescribed at discharge. She denies any HA, visual disturbances or shortness of breath. Her next office appointment was on 12/19/15 for BP/ f/up.  She is a healthy 25 yo, no prior HTN hx. Uncomplicated pregnancy until severe PEC at 34 wks.   Past Medical History:  Diagnosis Date  . Pregnancy induced hypertension     Past Surgical History:  Procedure Laterality Date  . CESAREAN SECTION N/A 12/10/2015   Procedure: CESAREAN SECTION;  Surgeon: Aloha Gell, MD;  Location: [facility];  Service: Obstetrics;  Laterality: N/A;  . WISDOM TOOTH EXTRACTION      Family History  Problem Relation Age of Onset  . Hypothyroidism Mother   . Heart failure Paternal Grandfather     Social History:  reports that she has never smoked. She has never used smokeless tobacco. She reports that she drinks about 1.2 oz of alcohol per week . She reports that she does not use drugs.  Allergies: No Known Allergies  Prescriptions Prior to Admission  Medication Sig Dispense Refill Last Dose  . coconut oil OIL Apply 1 application topically as needed.  0   . ibuprofen (ADVIL,MOTRIN) 600 MG tablet Take 1 tablet (600 mg total) by mouth every 6 (six) hours. 30 tablet 0   . oxyCODONE 10 MG TABS Take 0.5 tablets (5 mg total) by mouth every 4 (four) hours as needed for moderate pain (pain scale > 7). 30 tablet 0   . Prenatal Vit-Fe Fumarate-FA (PRENATAL MULTIVITAMIN) TABS tablet Take 1 tablet by mouth daily at 12 noon.    12/09/2015 at Unknown time  . senna-docusate (SENOKOT-S) 8.6-50 MG tablet Take 2 tablets by mouth daily.     . simethicone (MYLICON) 80 MG chewable tablet Chew 1 tablet (80 mg total) by mouth 3 (three) times daily after meals. 30 tablet 0     ROS  Above   Physical Exam MAU BP 166/102, 160/101, 163/95- Labetalol 20mg  IV x 1 dose, then Hydralazine 10mg  IV given due to bradycardia. Procardia 30mg  XL ordered for this morning.   BP 127/68 (BP Location: Right Arm)   Pulse 76   Temp 97.5 F (36.4 C) (Oral)   Resp 16   Ht 5\' 9"  (1.753 m)   Wt 175 lb (79.4 kg)   LMP 04/12/2015 (Exact Date)   SpO2 100%   BMI 25.84 kg/m  Wt loss from 184 lbs on 9/14 to 175 lbs today  A&O x 3, no acute distress. Pleasant HEENT neg, no thyromegaly Lungs CTA bilat CV RRR, S1S2 normal Abdo soft, non tender, non acute Extr no edema/ tenderness. DTR +3 Pelvic deferred  Results for orders placed or performed during the hospital encounter of 12/15/15 (from the past 24 hour(s))  CBC     Status: Abnormal   Collection Time: 12/15/15 11:45 PM  Result Value Ref Range   WBC 12.0 (H) 4.0 - 10.5 K/uL   RBC 3.78 (L) 3.87 - 5.11 MIL/uL   Hemoglobin 11.8 (L) 12.0 - 15.0 g/dL   HCT 33.8 (L) 36.0 - 46.0 %  MCV 89.4 78.0 - 100.0 fL   MCH 31.2 26.0 - 34.0 pg   MCHC 34.9 30.0 - 36.0 g/dL   RDW 13.1 11.5 - 15.5 %   Platelets 268 150 - 400 K/uL  Comprehensive metabolic panel     Status: Abnormal   Collection Time: 12/15/15 11:45 PM  Result Value Ref Range   Sodium 135 135 - 145 mmol/L   Potassium 4.4 3.5 - 5.1 mmol/L   Chloride 106 101 - 111 mmol/L   CO2 23 22 - 32 mmol/L   Glucose, Bld 96 65 - 99 mg/dL   BUN 15 6 - 20 mg/dL   Creatinine, Ser 0.68 0.44 - 1.00 mg/dL   Calcium 9.2 8.9 - 10.3 mg/dL   Total Protein 6.1 (L) 6.5 - 8.1 g/dL   Albumin 2.9 (L) 3.5 - 5.0 g/dL   AST 31 15 - 41 U/L   ALT 45 14 - 54 U/L   Alkaline Phosphatase 101 38 - 126 U/L   Total Bilirubin 0.6 0.3 - 1.2 mg/dL   GFR calc non Af Amer  >60 >60 mL/min   GFR calc Af Amer >60 >60 mL/min   Anion gap 6 5 - 15  Urinalysis, Routine w reflex microscopic (not at [facility])     Status: Abnormal   Collection Time: 12/16/15 12:08 AM  Result Value Ref Range   Color, Urine YELLOW YELLOW   APPearance CLEAR CLEAR   Specific Gravity, Urine 1.010 1.005 - 1.030   pH 7.0 5.0 - 8.0   Glucose, UA NEGATIVE NEGATIVE mg/dL   Hgb urine dipstick SMALL (A) NEGATIVE   Bilirubin Urine NEGATIVE NEGATIVE   Ketones, ur NEGATIVE NEGATIVE mg/dL   Protein, ur 30 (A) NEGATIVE mg/dL   Nitrite NEGATIVE NEGATIVE   Leukocytes, UA NEGATIVE NEGATIVE  Protein / creatinine ratio, urine     Status: Abnormal   Collection Time: 12/16/15 12:08 AM  Result Value Ref Range   Creatinine, Urine 28.00 mg/dL   Total Protein, Urine 43 mg/dL   Protein Creatinine Ratio 1.54 (H) 0.00 - 0.15 mg/mg[Cre]  Urine microscopic-add on     Status: Abnormal   Collection Time: 12/16/15 12:08 AM  Result Value Ref Range   Squamous Epithelial / LPF 0-5 (A) NONE SEEN   WBC, UA 0-5 0 - 5 WBC/hpf   RBC / HPF 0-5 0 - 5 RBC/hpf   Bacteria, UA RARE (A) NONE SEEN    No results found.  Assessment/Plan: Postpartum day 6, severe hypertension with recent severe preeclampsia. Admitting for observation and BP control. Labs reviewed, urine p/c ratio still elevated. Deferring restarting magnesium sulphate since denies headaches or vision changes and will continue to monitor and control BP to optimize antiHTN medication before discharge.  Daily weights, repeat labs tomorrow.   Rolla Servidio R 12/16/2015, 9:00 AM

## 2015-12-16 NOTE — Lactation Note (Signed)
This note was copied from a baby's chart. Lactation Consultation Note  Patient Name: Tara White [id number] Date: 12/16/2015 Reason for consult: Follow-up assessment;NICU baby  NICU baby 104 days old. Mom readmitted d/t elevated BP. Mom just leaving her room on the way to NICU. Mom reports that she has a DEBP at the bedside and her EBM volumes have doubled overnight. Mom reports that she is able to pump over an ounce from each breast now. Mom had questions about her BP medications: Apresoline, Procardia and Labetalol are all L2 (Limited Data-Probability Compatible) per [facility], "Medications and Mothers' Milk," 2014.   Maternal Data    Feeding    LATCH Score/Interventions                      Lactation Tools Discussed/Used     Consult Status Consult Status: PRN    Andres Labrum 12/16/2015, 2:35 PM

## 2015-12-17 LAB — COMPREHENSIVE METABOLIC PANEL
ALBUMIN: 2.8 g/dL — AB (ref 3.5–5.0)
ALK PHOS: 91 U/L (ref 38–126)
ALT: 34 U/L (ref 14–54)
ANION GAP: 7 (ref 5–15)
AST: 20 U/L (ref 15–41)
BUN: 16 mg/dL (ref 6–20)
CALCIUM: 8.6 mg/dL — AB (ref 8.9–10.3)
CO2: 23 mmol/L (ref 22–32)
Chloride: 108 mmol/L (ref 101–111)
Creatinine, Ser: 0.9 mg/dL (ref 0.44–1.00)
GFR calc Af Amer: >60 mL/min (ref >60)
GFR calc non Af Amer: >60 mL/min (ref >60)
GLUCOSE: 86 mg/dL (ref 65–99)
POTASSIUM: 4.2 mmol/L (ref 3.5–5.1)
SODIUM: 138 mmol/L (ref 135–145)
Total Bilirubin: 0.4 mg/dL (ref 0.3–1.2)
Total Protein: 6.3 g/dL — ABNORMAL LOW (ref 6.5–8.1)

## 2015-12-17 LAB — CBC
HCT: 35.1 % — ABNORMAL LOW (ref 36.0–46.0)
HEMOGLOBIN: 12.2 g/dL (ref 12.0–15.0)
MCH: 31.4 pg (ref 26.0–34.0)
MCHC: 34.8 g/dL (ref 30.0–36.0)
MCV: 90.5 fL (ref 78.0–100.0)
Platelets: 299 10*3/uL (ref 150–400)
RBC: 3.88 MIL/uL (ref 3.87–5.11)
RDW: 13.4 % (ref 11.5–15.5)
WBC: 10.1 10*3/uL (ref 4.0–10.5)

## 2015-12-17 MED ORDER — NIFEDIPINE ER 30 MG PO TB24
30.0000 mg | ORAL_TABLET | Freq: Every day | ORAL | Status: DC
  Administered 2015-12-17: 30 mg via ORAL
  Filled 2015-12-17 (×2): qty 1

## 2015-12-17 MED ORDER — NIFEDIPINE ER 30 MG PO TB24: 30.0000 mg | ORAL_TABLET | Freq: Every day | ORAL | 1 refills | Status: DC

## 2015-12-17 NOTE — Progress Notes (Signed)
Patient ID: Tara White, female   DOB: [birth year], 25 y.o.   MRN: [id number] HD #2 No complaints.\ Patient Vitals for the past 24 hrs:  BP Temp Temp src Pulse Resp SpO2 Weight  12/17/15 1318 136/90 - - 72 - - -  12/17/15 1244 (!) 155/95 - - 61 - - -  12/17/15 1203 (!) 149/100 97.8 F (36.6 C) Oral 79 18 100 % -  12/17/15 0528 130/88 97.6 F (36.4 C) Oral (!) 57 18 100 % 75.3 kg (166 lb)  12/17/15 0141 133/77 98.2 F (36.8 C) Oral 62 16 99 % -  12/16/15 2144 125/86 98.8 F (37.1 C) Oral 85 16 100 % -  12/16/15 1702 (!) 141/90 98.7 F (37.1 C) Oral 89 18 100 % -  12/16/15 1339 (!) 135/94 97.9 F (36.6 C) Oral 84 18 100 % -   HEENT : nl Neck: supple with FROM Chest: CTA CV: RRR ABD : soft , NT No CVAT EXT: 2+ DTRs Neuro: non focal Skin: intact CBC    Component Value Date/Time   WBC 10.1 12/17/2015 0610   RBC 3.88 12/17/2015 0610   HGB 12.2 12/17/2015 0610   HGB 14.0 02/11/2015 1455   HCT 35.1 (L) 12/17/2015 0610   PLT 299 12/17/2015 0610   MCV 90.5 12/17/2015 0610   MCH 31.4 12/17/2015 0610   MCHC 34.8 12/17/2015 0610   RDW 13.4 12/17/2015 0610    CMP     Component Value Date/Time   NA 138 12/17/2015 0610   K 4.2 12/17/2015 0610   CL 108 12/17/2015 0610   CO2 23 12/17/2015 0610   GLUCOSE 86 12/17/2015 0610   BUN 16 12/17/2015 0610   CREATININE 0.90 12/17/2015 0610   CALCIUM 8.6 (L) 12/17/2015 0610   PROT 6.3 (L) 12/17/2015 0610   ALBUMIN 2.8 (L) 12/17/2015 0610   AST 20 12/17/2015 0610   ALT 34 12/17/2015 0610   ALKPHOS 91 12/17/2015 0610   BILITOT 0.4 12/17/2015 0610   GFRNONAA >60 12/17/2015 0610   GFRAA >60 12/17/2015 0610   IMP: PP PEC BP controlled- procardia XL not given this am(?) DC home Procardia XL qd FU office 5d PEC precautions

## 2015-12-17 NOTE — Lactation Note (Signed)
This note was copied from a baby's chart. Lactation Consultation Note  Patient Name: Tara White [id number] Date: 12/17/2015 Reason for consult: Follow-up assessment;NICU baby NICU baby 15 days old. Mom attempting to latch baby in NICU, but baby very fussy at the breast. Assisted with latching and repositioned baby in football position at left breast. Mom is easily expressible, but her breasts are very full and she is not compressible. Baby rooting and trying to latch, but breasts are so full that baby cannot latch. Enc mom to pump to soften breasts prior to next feeding. Mom reports that when she arrived in the NICU the baby was on a different feeding schedule, so now she will adjust. Mom reports that her milk supply is continuing to increase.   Maternal Data    Feeding Feeding Type: Breast Fed Nipple Type: Slow - flow Length of feed: 0 min  LATCH Score/Interventions Latch: Too sleepy or reluctant, no latch achieved, no sucking elicited. Intervention(s): Skin to skin;Teach feeding cues Intervention(s): Adjust position;Assist with latch;Breast compression  Audible Swallowing: None Intervention(s): Skin to skin;Hand expression  Type of Nipple: Everted at rest and after stimulation  Comfort (Breast/Nipple): Soft / non-tender     Hold (Positioning): Assistance needed to correctly position infant at breast and maintain latch. Intervention(s): Breastfeeding basics reviewed;Position options;Skin to skin;Support Pillows  LATCH Score: 5  Lactation Tools Discussed/Used     Consult Status Consult Status: PRN    Andres Labrum 12/17/2015, 9:58 AM

## 2015-12-17 NOTE — Progress Notes (Signed)
Teaching complete  Pt ambulated   Out teaching complete

## 2015-12-18 ENCOUNTER — Ambulatory Visit: Payer: Self-pay

## 2015-12-18 NOTE — Lactation Note (Signed)
This note was copied from a baby's chart. Lactation Consultation Note  Patient Name: Tara White [id number] Date: 12/18/2015 Reason for consult: Follow-up assessment;NICU baby  NICU baby 37 days old. Mom reports that the baby nursed well after she softened her breasts before the feeding. Mom had questions about pumping/nursing after D/C. Enc putting baby to breast with each feeding, then supplementing baby, and then post-pumping. Enc mom to let support people supplement baby while she pumps as needed. Discussed support group and outpatient appointments for pre- and post-weights and additional assistance with latching. Discussed hind milk and enc alternating the breast that mom offers at the start of each feeding.   Maternal Data    Feeding Feeding Type: Breast Milk Nipple Type: Slow - flow Length of feed: 15 min  LATCH Score/Interventions                      Lactation Tools Discussed/Used     Consult Status Consult Status: PRN    Andres Labrum 12/18/2015, 12:22 PM

## 2015-12-19 ENCOUNTER — Ambulatory Visit: Payer: Self-pay

## 2015-12-19 NOTE — Lactation Note (Signed)
This note was copied from a baby's chart. Lactation Consultation Note  Patient Name: Tara White [id number] Date: 12/19/2015 Reason for consult: Follow-up assessment;NICU baby  NICU baby 63 days old. Asked to call MOB to discuss suggestions to help with posture and sore back while pumping. Enc mom to use pillows behind her back to sit up more straight. Also enc placing pillows under her arms, and elevating her legs on a footstool. Discussed holding flanges at the breast instead of the head of the bottle. Mom reports that these are things she has not been doing so far.   Maternal Data    Feeding    LATCH Score/Interventions                      Lactation Tools Discussed/Used     Consult Status Consult Status: PRN    Andres Labrum 12/19/2015, 3:27 PM

## 2015-12-22 NOTE — Discharge Summary (Signed)
NAMELANGSTON, KELTER                  ACCOUNT NO.:  [id number]  MEDICAL RECORD NO.:  [id number]  LOCATION:  [id number]                          FACILITY:  [facility]  PHYSICIAN:  Lovenia Kim, M.D.DATE OF BIRTH:  1991/03/11  DATE OF ADMISSION:  12/15/2015 DATE OF DISCHARGE:  12/17/2015                              DISCHARGE SUMMARY   CHIEF COMPLAINT:  Postpartum hypertensive exacerbation.  COURSE OF HOSPITALIZATION:  The patient was admitted for exacerbation of hypertension postpartum.  She was severely preeclamptic, delivered emergently at 34 weeks by C-section.  Baby is in the NICU, presented to the hospital with elevation of blood pressures at home, and upon presentation to MAU, blood pressures were elevated, and Dr. Benjie Karvonen chose to admit the patient for blood pressure control.  Labs were otherwise normal.  She was discharged to home on hospital day #1, on Procardia XL once per day.   Pre-eclamptic precautions given.  Blood pressure control markedly improved during her hospital stay.  DC diagnosis: postpartum hypertensive exacerbation DC medications: Procardia XL Followup in office 3-4 days   Lovenia Kim, M.D.     RJT/MEDQ  D:  12/21/2015  T:  12/21/2015  Job:  [id number]

## 2016-01-02 ENCOUNTER — Telehealth (HOSPITAL_COMMUNITY): Payer: Self-pay | Admitting: Lactation Services

## 2016-01-02 NOTE — Telephone Encounter (Signed)
Mom called and left message complaining of burning to breast and around nipple on one breast. She delivered 3 weeks ago and went home 1 week ago. Infant was a NICU infant. Mom did not answer call Left message for mom to call us back at her earliest convenience.

## 2016-02-11 ENCOUNTER — Telehealth (HOSPITAL_COMMUNITY): Payer: Self-pay | Admitting: Lactation Services

## 2016-02-11 NOTE — Telephone Encounter (Signed)
Baby born at 52 wks., 3 lb 10 oz. Baby now 47 wks old, weight at [facility] Monday 7 lb. 12 oz. Peds has instructed Mom she can now start putting baby to breast more often but to continue with some supplements. Mom reports she has been BF every other feeding, approx 3 times per day. Supplementing with 24 cal formula with EBM every feeding (every 3 hours) up to 90 ml. She is pump/bottle feeding only at night. She pumps and baby takes bottle 4 times/night. Mom is pumping every 3 hours receiving 4 oz total. Mom wants help with new feeding plan to include more BF. Discussed different options with Mom and agreed on the following plan: Mom will BF during the day/evening each feeding at least every 2-3 hours. Try to get baby to nurse 15-20 minutes both breasts each feeding.  If breasts are soft and baby satisfied, Mom may not need to post pump. If baby only BF from 1 breast then may need to pump other breast to comfort. Alternate each feeding the breasts she starts BF from. Mom plans to continue to pump/bottle at night up to the 4 times/night. Mom will continue supplements with feedings at night unless baby not satisfied at breast and needs supplement during the day as well.  Discussed with Mom to be sure and not let breast become engorged since she has been pumping every 3 hours. OP Lactation f/u scheduled for Wednesday, 02/18/16 at 1:00pm.

## 2016-02-18 ENCOUNTER — Ambulatory Visit (HOSPITAL_COMMUNITY)
Admission: RE | Admit: 2016-02-18 | Discharge: 2016-02-18 | Disposition: A | Discharge status: Home / Self Care | Payer: Managed Care, Other (non HMO) | Source: Ambulatory Visit | Attending: Obstetrics and Gynecology | Admitting: Obstetrics and Gynecology

## 2016-02-18 NOTE — Lactation Note (Signed)
Lactation Consult  Mother's reason for visit: feeding assessment.  Visit Type:  Feeding assessment.  Appointment Notes: Mother states that she is bottle feeding infant 3 bottles at night and one bottle during the day. She gives her 90 ml with added calories up to 24 cals per ounce.   Consult:  Initial Lactation Consultant:  Darla Lesches  ________________________________________________________________________    ________________________________________________________________________  Mother's Name: Tara White Type of delivery:  c-section Breastfeeding Experience:  none Maternal Medical Conditions:  Pregnancy induced hypertension Maternal Medications:  Procardia  ________________________________________________________________________  Breastfeeding History (Post Discharge)  Frequency of breastfeeding:  Every 2-3 hours Duration of feeding: 20-40 mins     Pumping  Type of pump:  Medela pump in style Frequency:  5 times Volume: 4 ounces   Infant Intake and Output Assessment  Voids:  8 in 24 hrs.  Color:  Clear yellow Stools:4   in 24 hrs.  Color:  Yellow  ________________________________________________________________________  Maternal Breast Assessment  Breast:  Full Nipple:  Erect Pain level:  0, Mother has pain in right breast #3 pain scale periodic. Pain interventions:  Bra and Expressed breast milk  _______________________________________________________________________ Feeding Assessment/Evaluation Mother latched Tara White on the left breast without difficulty. Observed a few burst of rhythmic suckled and swallows.  Infant transferred 28 ml from the left breast. Observed more of a pattern of suckling on the alternate breast. Infant transferred 50ml    Infant's oral assessment:  WNL  Positioning:  Cross cradle Left breast  LATCH documentation:  Latch:  2 = Grasps breast easily, tongue down, lips flanged, rhythmical sucking.  Audible swallowing:   2 = Spontaneous and intermittent  Type of nipple:  2 = Everted at rest and after stimulation  Comfort (Breast/Nipple):  1 = Filling, red/small blisters or bruises, mild/mod discomfort  Hold (Positioning):  2 = No assistance needed to correctly position infant at breast  LATCH score:  9   Attached assessment:  Deep  Lips flanged:  Yes.    Lips untucked:  Yes.    Suck assessment:  Displays both   Pre-feed weight: 8-7.2, 3832   Post-feed weight:  8-8.1 3860 g  Amount transferred: 28 ml   Infant's oral assessment:  WNL  Positioning:  Football Right breast  LATCH documentation:  Latch:  2 = Grasps breast easily, tongue down, lips flanged, rhythmical sucking.  Audible swallowing:  2 = Spontaneous and intermittent  Type of nipple:  2 = Everted at rest and after stimulation  Comfort (Breast/Nipple):  1 = Filling, red/small blisters or bruises, mild/mod discomfort  Hold (Positioning):  1 = Assistance needed to correctly position infant at breast and maintain latch  LATCH score:  8  Attached assessment:  Deep  Lips flanged:  Yes.    Lips untucked:  Yes.    Suck assessment:  Displays both    Pre-feed weight: 8-8.1 3860 g Post-feed weight: 8-10.9 3938 g Amount transferred  78 ml l  Total amount transferred: 106 ml  advised mother to continue to follow her pattern of feeding Tara White. Lots of support and encouragement given  To mother  Advised to breastfeed on both breast  Do breast compression  Mother to pump 4-5 times daily Continue to fortify bottles until she see her Peds in December Follow up with [facility] for weight or BFSG's prn

## 2016-03-12 ENCOUNTER — Telehealth (HOSPITAL_COMMUNITY): Payer: Self-pay | Admitting: Lactation Services

## 2016-03-12 NOTE — Telephone Encounter (Signed)
Mom called w/questions pertaining to managing her breasts during the nighttime hours (Mom has an abundant supply). Questions answered. Elinor Dodge, RN, IBCLC

## 2016-05-05 ENCOUNTER — Telehealth (HOSPITAL_COMMUNITY): Payer: Self-pay

## 2016-05-05 NOTE — Telephone Encounter (Signed)
Mom left a message requesting a weight check for the baby.  Returned phone call explaining that an OP appointment would be required and what tomorrows availability was. Asked her to return call if appointment was desired.

## 2016-08-06 ENCOUNTER — Telehealth (HOSPITAL_COMMUNITY): Payer: Self-pay | Admitting: Lactation Services

## 2016-08-06 NOTE — Telephone Encounter (Signed)
Patient had mastitis last weekend & she had questions about infant not wanting to drink from that breast & impact on milk supply. Questions answered. Elinor Dodge, RN, IBCLC

## 2016-11-24 ENCOUNTER — Telehealth (HOSPITAL_COMMUNITY): Payer: Self-pay | Admitting: Lactation Services

## 2016-11-24 NOTE — Telephone Encounter (Signed)
Mom called to ask about weaning production. Mom reports she stopped BF her child 2 days ago as infant was very distracted and mom felt very stressed. She reports she was only BF in the morning and was pumping once in the evening and stopped the pumping 2 days ago also. She experienced a lump this morning and expressed milk out.   Enc mom to wear supportive bra, pump/express for comfort, ice packs or cabbage leaves in her bra as desired. Discussed with mom it may take several days for milk production to cease. Mom voiced understanding, to call back with further questions/concerns.

## 2020-02-19 ENCOUNTER — Telehealth (HOSPITAL_COMMUNITY): Payer: Self-pay

## 2020-02-19 NOTE — Telephone Encounter (Signed)
Mom called about a reoccurring bleb on her right nipple for the last 4 1/2 months. Mom states bleb forms after latching, skin softens and forms. It releases and then reforms.   LC reviewed with Mom the following techniques:  1. To place breasts in a warm basin leaning forward.   2. To latch baby on the affected side and then use her DEBP at the same time. With the increase in pressure, she should feel a pop when it releases.  3. She can apply coconut oil or olive oil to the bleb 4. She can try lecithin as a nutritional supplement.   Mom delivered at [facility] for this 53 month old but felt more confident calling our LC's for advice.  Mom given outpatient [facility] number to make an appointment [phone].  Mom stated she understood protocol for treatment listed above.

## 2021-03-29 DIAGNOSIS — C801 Malignant (primary) neoplasm, unspecified: Secondary | ICD-10-CM

## 2021-03-29 HISTORY — DX: Malignant (primary) neoplasm, unspecified: C80.1

## 2021-04-08 ENCOUNTER — Other Ambulatory Visit: Payer: Self-pay

## 2021-04-10 ENCOUNTER — Encounter: Payer: Self-pay | Admitting: Physician Assistant

## 2021-04-10 ENCOUNTER — Telehealth: Payer: Self-pay | Admitting: Hematology and Oncology

## 2021-04-10 NOTE — Telephone Encounter (Signed)
Spoke to patient to confirm afternoon clinic appointment for 1/18, packet will be sent to patient via e-mail

## 2021-04-13 ENCOUNTER — Encounter: Payer: Self-pay | Admitting: *Deleted

## 2021-04-14 ENCOUNTER — Other Ambulatory Visit: Payer: Self-pay | Admitting: *Deleted

## 2021-04-14 DIAGNOSIS — C50411 Malignant neoplasm of upper-outer quadrant of right female breast: Secondary | ICD-10-CM

## 2021-04-14 DIAGNOSIS — Z171 Estrogen receptor negative status [ER-]: Secondary | ICD-10-CM

## 2021-04-14 NOTE — Progress Notes (Signed)
[place] CONSULT NOTE  Patient Care Team: Patient, No Pcp Per (Inactive) as PCP - General (General Practice) Mauro Kaufmann, RN as Oncology Nurse Navigator Rockwell Germany, RN as Oncology Nurse Navigator Nicholas Lose, MD as Consulting Physician (Hematology and Oncology) Coralie Keens, MD as Consulting Physician (General Surgery) Eppie Gibson, MD as Attending Physician (Radiation Oncology)  CHIEF COMPLAINTS/PURPOSE OF CONSULTATION:  Newly diagnosed right breast cancer  HISTORY OF PRESENTING ILLNESS:  Tara White 31 y.o. female is here because of recent diagnosis of invasive ductal carcinoma of the right breast. Diagnostic mammogram 04/08/2021 showed irregular palpable mass with calcifications in the right breast. Biopsy on 04/08/2021 showed invasive ductal carcinoma with right axillary lymph node positive for metastatic carcinoma with necrosis. She presents to the clinic today for initial evaluation and discussion of treatment options.  Prior to all of this she had eczematous changes in the right nipple for which she was previously treated with medications.  This however has not improved.  I reviewed her records extensively and collaborated the history with the patient.  SUMMARY OF ONCOLOGIC HISTORY: Oncology History  Malignant neoplasm of upper-outer quadrant of right breast in female, estrogen receptor negative ([place])  04/08/2021 Initial Diagnosis   History of right breast eczematous change to the nipple, patient had palpable lump in the right breast her mammogram 2.1 cm spiculated mass with extensive calcifications that span 11 cm.  Ultrasound revealed 3 cm mass at 10:00 plus small satellite lesions.  Biopsy revealed grade 3 IDC ER 0%, PR 0%, HER2 3+ positive, Ki-67 30%   04/08/2021 Cancer Staging   Staging form: Breast, AJCC 8th Edition - Clinical stage from 04/08/2021: Stage IIB (cT2, cN1, cM0, G3, ER-, PR-, HER2+) - Signed by Nicholas Lose, MD on 04/15/2021 Stage  prefix: Initial diagnosis Method of lymph node assessment: Axillary lymph node dissection Histologic grading system: 3 grade system      MEDICAL HISTORY:  Past Medical History:  Diagnosis Date   Pregnancy induced hypertension     SURGICAL HISTORY: Past Surgical History:  Procedure Laterality Date   CESAREAN SECTION N/A 12/10/2015   Procedure: CESAREAN SECTION;  Surgeon: Aloha Gell, MD;  Location: [place];  Service: Obstetrics;  Laterality: N/A;   WISDOM TOOTH EXTRACTION      SOCIAL HISTORY: Social History   Socioeconomic History   Marital status: Married    Spouse name: Not on file   Number of children: Not on file   Years of education: Not on file   Highest education level: Not on file  Occupational History   Not on file  Tobacco Use   Smoking status: Never   Smokeless tobacco: Never  Substance and Sexual Activity   Alcohol use: Yes    Alcohol/week: 2.0 standard drinks    Types: 2 Standard drinks or equivalent per week   Drug use: No   Sexual activity: Not Currently    Partners: Male    Birth control/protection: Pill    Comment: Loestrin  Other Topics Concern   Not on file  Social History Narrative   Not on file   Social Determinants of Health   Financial Resource Strain: Not on file  Food Insecurity: Not on file  Transportation Needs: Not on file  Physical Activity: Not on file  Stress: Not on file  Social Connections: Not on file  Intimate Partner Violence: Not on file    FAMILY HISTORY: Family History  Problem Relation Age of Onset   Hypothyroidism Mother  Breast cancer Maternal Grandmother    Heart failure Paternal Grandfather     ALLERGIES:  has No Known Allergies.  MEDICATIONS:  Current Outpatient Medications  Medication Sig Dispense Refill   Homeopathic Products (FRANKINCENSE UPLIFTING) OIL Apply 2 drops topically.     No current facility-administered medications for this visit.    REVIEW OF SYSTEMS:    Constitutional: Denies fevers, chills or abnormal night sweats Eyes: Denies blurriness of vision, double vision or watery eyes Ears, nose, mouth, throat, and face: Denies mucositis or sore throat Respiratory: Denies cough, dyspnea or wheezes Cardiovascular: Denies palpitation, chest discomfort or lower extremity swelling Gastrointestinal:  Denies nausea, heartburn or change in bowel habits Skin: Denies abnormal skin rashes Lymphatics: Denies new lymphadenopathy or easy bruising Neurological:Denies numbness, tingling or new weaknesses Behavioral/Psych: Mood is stable, no new changes  Breast: Palpable lump in the right breast All other systems were reviewed with the patient and are negative.  PHYSICAL EXAMINATION: ECOG PERFORMANCE STATUS: 1 - Symptomatic but completely ambulatory  Vitals:   04/15/21 1235  BP: 133/75  Pulse: 78  Resp: 18  Temp: 98.1 F (36.7 C)  SpO2: 100%   Filed Weights   04/15/21 1235  Weight: 155 lb 8 oz (70.5 kg)     LABORATORY DATA:  I have reviewed the data as listed Lab Results  Component Value Date   WBC 8.0 04/15/2021   HGB 13.7 04/15/2021   HCT 39.9 04/15/2021   MCV 87.3 04/15/2021   PLT 288 04/15/2021   Lab Results  Component Value Date   NA 141 04/15/2021   K 3.8 04/15/2021   CL 105 04/15/2021   CO2 28 04/15/2021    RADIOGRAPHIC STUDIES: I have personally reviewed the radiological reports and agreed with the findings in the report.  ASSESSMENT AND PLAN:  Malignant neoplasm of upper-outer quadrant of right breast in female, estrogen receptor negative ([place]) 04/08/2021:History of right breast eczematous change to the nipple, patient had palpable lump in the right breast her mammogram 2.1 cm spiculated mass with extensive calcifications that span 11 cm.  Ultrasound revealed 3 cm mass at 10:00 plus small satellite lesions.  Biopsy revealed grade 3 IDC ER 0%, PR 0%, HER2 3+ positive, Ki-67 30%  Pathology and radiology counseling: Discussed  with the patient, the details of pathology including the type of breast cancer,the clinical staging, the significance of ER, PR and HER-2/neu receptors and the implications for treatment. After reviewing the pathology in detail, we proceeded to discuss the different treatment options between surgery, radiation, chemotherapy, antiestrogen therapies.  Recommendation based on multidisciplinary tumor board: 1. Neoadjuvant chemotherapy with TCH Perjeta 6 cycles followed by Herceptin Perjeta maintenance versus Kadcyla maintenance (based on response to neoadjuvant chemo) for 1 year 2. Followed by breast conserving surgery if possible with sentinel lymph node study 3. Followed by adjuvant radiation therapy if patient had lumpectomy  Chemotherapy Counseling: I discussed the risks and benefits of chemotherapy including the risks of nausea/ vomiting, risk of infection from low WBC count, fatigue due to chemo or anemia, bruising or bleeding due to low platelets, mouth sores, loss/ change in taste and decreased appetite. Liver and kidney function will be monitored through out chemotherapy as abnormalities in liver and kidney function may be a side effect of treatment. Cardiac dysfunction due to Herceptin and Perjeta and neuropathy risk from Taxotere were discussed in detail. Risk of permanent bone marrow dysfunction due to chemo were also discussed.  Plan: 1. Port placement 2. Echocardiogram 3. Chemotherapy class  4. Breast MRI We discussed enrollment in the nausea study  Return to clinic in 2 weeks to start chemotherapy.    All questions were answered. The patient knows to call the clinic with any problems, questions or concerns.   Rulon Eisenmenger, MD, MPH 04/15/2021    I, Thana Ates, am acting as scribe for Nicholas Lose, MD.  I have reviewed the above documentation for accuracy and completeness, and I agree with the above.

## 2021-04-14 NOTE — Progress Notes (Signed)
Radiation Oncology         [phone] ________________________________  Initial Outpatient Consultation  Name: Tara White MRN: [id number]  Date: 04/15/2021  DOB: [birth year]  [id number], No Pcp Per (Inactive)  Coralie Keens, MD   REFERRING PHYSICIAN: Coralie Keens, MD  DIAGNOSIS:    ICD-10-CM   1. Malignant neoplasm of upper-outer quadrant of right breast in female, estrogen receptor negative ([place])  C50.411    Z17.1      Cancer Staging  Malignant neoplasm of upper-outer quadrant of right breast in female, estrogen receptor negative ([place]) Staging form: Breast, AJCC 8th Edition - Clinical stage from 04/08/2021: Stage IIB (cT2, cN1, cM0, G3, ER-, PR-, HER2+) - Signed by Nicholas Lose, MD on 04/15/2021 Stage prefix: Initial diagnosis Method of lymph node assessment: Axillary lymph node dissection Histologic grading system: 3 grade system  Stage IIB (cT2, cN1, cM0) Right Breast UOQ Invasive Ductal Carcinoma, ER- / PR- / Her2+, Grade 3  CHIEF COMPLAINT: Here to discuss management of right breast cancer  HISTORY OF PRESENT ILLNESS::Tara White is a 31 y.o. female who presented with breast abnormality on the following imaging: diagnostic bilateral mammogram on the date of 04/08/21 which showed an indeterminate mass with calcifications in the right breast, 10:00 position, anterior depth.  Indications for imaging, if any, at that time, were: a palpable, walnut sized lump in the upper outer right breast. The patient reported first noticing the lump around 4 months ago, and noted an increase in size of the lump during that time. Ultrasound of the right breast on that same date further revealed the palpable irregular mass with calcifications in the right breast as highly suggestive of malignancy, measuring 2.5 cm.   A prominent right axillary lymph node was also appreciated and noted as suspicious for metastatic lymphadenopathy.   Biopsy of the right breast mass and calcifications, located  at the 10 o'clock position, 4cmfn, on the date of 04/08/21 showed grade 3 invasive ductal carcinoma measuring 0.9 cm in the greatest linear dimension. Biopsy of the suspicious right axillary lymph node revealed metastatic carcinoma with necrosis involving nodal tissue.  ER status negative; PR status negative; Her2 status positive; proliferation marker Ki67 at 30%; Grade 3  She is here with her husband; she is a homemaker; they have 3 children, ages 30, 82, 53. She previously breastfed, but not currently. She is a former Engineer, mining.  PREVIOUS RADIATION THERAPY: No  PAST MEDICAL HISTORY:  has a past medical history of Pregnancy induced hypertension.    PAST SURGICAL HISTORY: Past Surgical History:  Procedure Laterality Date   CESAREAN SECTION N/A 12/10/2015   Procedure: CESAREAN SECTION;  Surgeon: Aloha Gell, MD;  Location: [place];  Service: Obstetrics;  Laterality: N/A;   WISDOM TOOTH EXTRACTION      FAMILY HISTORY: family history includes Breast cancer in her maternal grandmother; Heart failure in her paternal grandfather; Hypothyroidism in her mother.  SOCIAL HISTORY:  reports that she has never smoked. She has never used smokeless tobacco. She reports current alcohol use of about 2.0 standard drinks per week. She reports that she does not use drugs.  ALLERGIES: Patient has no known allergies.  MEDICATIONS:  Current Outpatient Medications  Medication Sig Dispense Refill   dexamethasone (DECADRON) 4 MG tablet Take 2 tablets (8 mg total) by mouth 2 (two) times daily. Take 1 tablet day before chemo and 1 tablet day after chemo with food 12 tablet 0   Homeopathic Products (FRANKINCENSE UPLIFTING) OIL Apply 2 drops  topically.     lidocaine-prilocaine (EMLA) cream Apply to affected area once 30 g 3   LORazepam (ATIVAN) 0.5 MG tablet Take 1 tablet (0.5 mg total) by mouth at bedtime as needed (Nausea or vomiting). 30 tablet 0   ondansetron (ZOFRAN) 8 MG tablet Take 1 tablet (8 mg  total) by mouth 2 (two) times daily as needed (Nausea or vomiting). Start on the third day after chemotherapy. 30 tablet 1   prochlorperazine (COMPAZINE) 10 MG tablet Take 1 tablet (10 mg total) by mouth every 6 (six) hours as needed (Nausea or vomiting). 30 tablet 1   No current facility-administered medications for this encounter.    REVIEW OF SYSTEMS: As above in HPI.   PHYSICAL EXAM:  vitals were not taken for this visit.   General: Alert and oriented, in no acute distress HEENT: Head is normocephalic. Extraocular movements are intact. Oropharynx is clear. Neck: Neck is supple, no palpable cervical or supraclavicular lymphadenopathy. Heart: Regular in rate and rhythm with no murmurs, rubs, or gallops. Chest: Clear to auscultation bilaterally, with no rhonchi, wheezes, or rales. Abdomen: Soft, nontender, nondistended, with no rigidity or guarding. Extremities: No cyanosis or edema. Lymphatics: see Neck Exam Skin: No concerning lesions. Musculoskeletal: symmetric strength and muscle tone throughout. Neurologic: Cranial nerves II through XII are grossly intact. No obvious focalities. Speech is fluent. Coordination is intact. Psychiatric: Judgment and insight are intact. Affect is appropriate. Breasts: Right breast: mass is palpable throughout majority of UOQ with an overlying 1.5 cm node in the axillary border; right nipple is ulcerated/moist/erythematous  . No other palpable masses appreciated in the breasts or axillae bilaterally .   ECOG = 1  0 - Asymptomatic (Fully active, able to carry on all predisease activities without restriction)  1 - Symptomatic but completely ambulatory (Restricted in physically strenuous activity but ambulatory and able to carry out work of a light or sedentary nature. For example, light housework, office work)  2 - Symptomatic, <50% in bed during the day (Ambulatory and capable of all self care but unable to carry out any work activities. Up and about  more than 50% of waking hours)  3 - Symptomatic, >50% in bed, but not bedbound (Capable of only limited self-care, confined to bed or chair 50% or more of waking hours)  4 - Bedbound (Completely disabled. Cannot carry on any self-care. Totally confined to bed or chair)  5 - Death   Eustace Pen MM, Creech RH, Tormey DC, et al. [id number]). "Toxicity and response criteria of the [facility]". National Oncol. 5 (6): 649-55   LABORATORY DATA:  Lab Results  Component Value Date   WBC 8.0 04/15/2021   HGB 13.7 04/15/2021   HCT 39.9 04/15/2021   MCV 87.3 04/15/2021   PLT 288 04/15/2021   CMP     Component Value Date/Time   NA 141 04/15/2021 1211   K 3.8 04/15/2021 1211   CL 105 04/15/2021 1211   CO2 28 04/15/2021 1211   GLUCOSE 119 (H) 04/15/2021 1211   BUN 19 04/15/2021 1211   CREATININE 0.95 04/15/2021 1211   CALCIUM 10.0 04/15/2021 1211   PROT 7.9 04/15/2021 1211   ALBUMIN 4.8 04/15/2021 1211   AST 18 04/15/2021 1211   ALT 14 04/15/2021 1211   ALKPHOS 54 04/15/2021 1211   BILITOT 0.8 04/15/2021 1211   GFRNONAA >60 04/15/2021 1211   GFRAA >60 12/17/2015 0610         RADIOGRAPHY:  as above  IMPRESSION/PLAN: This is a lovely 31 yo woman with ER negative right breast cancer.  Anticipate genetic counseling, neoadjuvant chemotherapy, mastectomy and nodal surgery.  It was a pleasure meeting the patient today. We discussed the risks, benefits, and side effects of radiotherapy. I recommend radiotherapy to the right breast and regional nodes postoperatively to reduce her risk of locoregional recurrence by 2/3.  We discussed that radiation would take approximately 6 weeks to complete and that I would give the patient a few weeks to heal following surgery before starting treatment planning.   We spoke about acute effects including skin irritation and fatigue as well as much less common late effects including internal organ injury or irritation. We spoke about the  latest technology that is used to minimize the risk of late effects for patients undergoing radiotherapy to the breast or chest wall.  She plans on breast reconstructive surgery and we discussed risks that RT can pose to healing and cosmesis. No guarantees of treatment were given. The patient is enthusiastic about proceeding with treatment. I look forward to participating in the patient's care.  I will await her referral back to me for postoperative follow-up and eventual CT simulation/treatment planning.  On date of service, in total, I spent 45 minutes on this encounter. Patient was seen in person.   __________________________________________   Eppie Gibson, MD  This document serves as a record of services personally performed by Eppie Gibson, MD. It was created on her behalf by Roney Mans, a trained medical scribe. The creation of this record is based on the scribe's personal observations and the provider's statements to them. This document has been checked and approved by the attending provider.

## 2021-04-15 ENCOUNTER — Inpatient Hospital Stay: Payer: Commercial Managed Care - PPO

## 2021-04-15 ENCOUNTER — Other Ambulatory Visit: Payer: Self-pay | Admitting: Surgery

## 2021-04-15 ENCOUNTER — Other Ambulatory Visit: Payer: Self-pay

## 2021-04-15 ENCOUNTER — Encounter: Payer: Self-pay | Admitting: Physical Therapy

## 2021-04-15 ENCOUNTER — Encounter: Payer: Self-pay | Admitting: *Deleted

## 2021-04-15 ENCOUNTER — Ambulatory Visit
Admission: RE | Admit: 2021-04-15 | Discharge: 2021-04-15 | Disposition: A | Discharge status: Home / Self Care | Payer: Commercial Managed Care - PPO | Source: Ambulatory Visit | Attending: Radiation Oncology | Admitting: Radiation Oncology

## 2021-04-15 ENCOUNTER — Ambulatory Visit: Payer: Commercial Managed Care - PPO | Attending: Surgery | Admitting: Physical Therapy

## 2021-04-15 ENCOUNTER — Other Ambulatory Visit: Payer: Self-pay | Admitting: *Deleted

## 2021-04-15 ENCOUNTER — Inpatient Hospital Stay (HOSPITAL_BASED_OUTPATIENT_CLINIC_OR_DEPARTMENT_OTHER): Payer: Commercial Managed Care - PPO | Admitting: Hematology and Oncology

## 2021-04-15 ENCOUNTER — Ambulatory Visit (HOSPITAL_BASED_OUTPATIENT_CLINIC_OR_DEPARTMENT_OTHER): Payer: Commercial Managed Care - PPO | Admitting: Genetic Counselor

## 2021-04-15 VITALS — BP 133/75 | HR 78 | Temp 98.1°F | Resp 18 | Ht 69.0 in | Wt 155.5 lb

## 2021-04-15 DIAGNOSIS — Z171 Estrogen receptor negative status [ER-]: Secondary | ICD-10-CM

## 2021-04-15 DIAGNOSIS — Z7289 Other problems related to lifestyle: Secondary | ICD-10-CM | POA: Insufficient documentation

## 2021-04-15 DIAGNOSIS — Z79899 Other long term (current) drug therapy: Secondary | ICD-10-CM | POA: Insufficient documentation

## 2021-04-15 DIAGNOSIS — R293 Abnormal posture: Secondary | ICD-10-CM | POA: Insufficient documentation

## 2021-04-15 DIAGNOSIS — C50411 Malignant neoplasm of upper-outer quadrant of right female breast: Secondary | ICD-10-CM

## 2021-04-15 DIAGNOSIS — Z8249 Family history of ischemic heart disease and other diseases of the circulatory system: Secondary | ICD-10-CM | POA: Insufficient documentation

## 2021-04-15 DIAGNOSIS — C773 Secondary and unspecified malignant neoplasm of axilla and upper limb lymph nodes: Secondary | ICD-10-CM | POA: Insufficient documentation

## 2021-04-15 DIAGNOSIS — Z8349 Family history of other endocrine, nutritional and metabolic diseases: Secondary | ICD-10-CM | POA: Insufficient documentation

## 2021-04-15 DIAGNOSIS — Z803 Family history of malignant neoplasm of breast: Secondary | ICD-10-CM | POA: Insufficient documentation

## 2021-04-15 DIAGNOSIS — Z1379 Encounter for other screening for genetic and chromosomal anomalies: Secondary | ICD-10-CM | POA: Diagnosis not present

## 2021-04-15 LAB — CMP (CANCER CENTER ONLY)
ALT: 14 U/L (ref 0–44)
AST: 18 U/L (ref 15–41)
Albumin: 4.8 g/dL (ref 3.5–5.0)
Alkaline Phosphatase: 54 U/L (ref 38–126)
Anion gap: 8 (ref 5–15)
BUN: 19 mg/dL (ref 6–20)
CO2: 28 mmol/L (ref 22–32)
Calcium: 10 mg/dL (ref 8.9–10.3)
Chloride: 105 mmol/L (ref 98–111)
Creatinine: 0.95 mg/dL (ref 0.44–1.00)
GFR, Estimated: >60 mL/min (ref >60)
Glucose, Bld: 119 mg/dL — ABNORMAL HIGH (ref 70–99)
Potassium: 3.8 mmol/L (ref 3.5–5.1)
Sodium: 141 mmol/L (ref 135–145)
Total Bilirubin: 0.8 mg/dL (ref 0.3–1.2)
Total Protein: 7.9 g/dL (ref 6.5–8.1)

## 2021-04-15 LAB — CBC WITH DIFFERENTIAL (CANCER CENTER ONLY)
Abs Immature Granulocytes: 0.02 10*3/uL (ref 0.00–0.07)
Basophils Absolute: 0 10*3/uL (ref 0.0–0.1)
Basophils Relative: 1 %
Eosinophils Absolute: 0.1 10*3/uL (ref 0.0–0.5)
Eosinophils Relative: 1 %
HCT: 39.9 % (ref 36.0–46.0)
Hemoglobin: 13.7 g/dL (ref 12.0–15.0)
Immature Granulocytes: 0 %
Lymphocytes Relative: 24 %
Lymphs Abs: 1.9 10*3/uL (ref 0.7–4.0)
MCH: 30 pg (ref 26.0–34.0)
MCHC: 34.3 g/dL (ref 30.0–36.0)
MCV: 87.3 fL (ref 80.0–100.0)
Monocytes Absolute: 0.3 10*3/uL (ref 0.1–1.0)
Monocytes Relative: 4 %
Neutro Abs: 5.7 10*3/uL (ref 1.7–7.7)
Neutrophils Relative %: 70 %
Platelet Count: 288 10*3/uL (ref 150–400)
RBC: 4.57 MIL/uL (ref 3.87–5.11)
RDW: 12.5 % (ref 11.5–15.5)
WBC Count: 8 10*3/uL (ref 4.0–10.5)
nRBC: 0 % (ref 0.0–0.2)

## 2021-04-15 LAB — GENETIC SCREENING ORDER

## 2021-04-15 MED ORDER — ONDANSETRON HCL 8 MG PO TABS: 8.0000 mg | ORAL_TABLET | Freq: Two times a day (BID) | ORAL | 1 refills | Status: DC | PRN

## 2021-04-15 MED ORDER — LIDOCAINE-PRILOCAINE 2.5-2.5 % EX CREA: TOPICAL_CREAM | CUTANEOUS | 3 refills | Status: DC

## 2021-04-15 MED ORDER — LORAZEPAM 0.5 MG PO TABS: 0.5000 mg | ORAL_TABLET | Freq: Every evening | ORAL | 0 refills | Status: DC | PRN

## 2021-04-15 MED ORDER — DEXAMETHASONE 4 MG PO TABS: 8.0000 mg | ORAL_TABLET | Freq: Two times a day (BID) | ORAL | 0 refills | Status: DC

## 2021-04-15 MED ORDER — PROCHLORPERAZINE MALEATE 10 MG PO TABS: 10.0000 mg | ORAL_TABLET | Freq: Four times a day (QID) | ORAL | 1 refills | Status: DC | PRN

## 2021-04-15 NOTE — Progress Notes (Signed)
START ON PATHWAY REGIMEN - Breast     Cycle 1: A cycle is 21 days:     Pertuzumab      Trastuzumab-xxxx      Docetaxel      Carboplatin    Cycles 2 through 6: A cycle is every 21 days:     Pertuzumab      Trastuzumab-xxxx      Docetaxel      Carboplatin   **Always confirm dose/schedule in your pharmacy ordering system**  Patient Characteristics: Preoperative or Nonsurgical Candidate (Clinical Staging), Neoadjuvant Therapy followed by Surgery, Invasive Disease, Chemotherapy, HER2 Positive, ER Negative/Unknown Therapeutic Status: Preoperative or Nonsurgical Candidate (Clinical Staging) AJCC M Category: cM0 AJCC Grade: G3 Breast Surgical Plan: Neoadjuvant Therapy followed by Surgery ER Status: Negative (-) AJCC 8 Stage Grouping: IIB HER2 Status: Positive (+) AJCC T Category: cT2 AJCC N Category: cN1 PR Status: Negative (-) Intent of Therapy: Curative Intent, Discussed with Patient

## 2021-04-15 NOTE — Assessment & Plan Note (Signed)
04/08/2021:History of right breast eczematous change to the nipple, patient had palpable lump in the right breast her mammogram 2.1 cm spiculated mass with extensive calcifications that span 11 cm.  Ultrasound revealed 3 cm mass at 10:00 plus small satellite lesions.  Biopsy revealed grade 3 IDC ER 0%, PR 0%, HER2 3+ positive, Ki-67 30%  Pathology and radiology counseling: Discussed with the patient, the details of pathology including the type of breast cancer,the clinical staging, the significance of ER, PR and HER-2/neu receptors and the implications for treatment. After reviewing the pathology in detail, we proceeded to discuss the different treatment options between surgery, radiation, chemotherapy, antiestrogen therapies.  Recommendation based on multidisciplinary tumor board: 1. Neoadjuvant chemotherapy with TCH Perjeta 6 cycles followed by Herceptin Perjeta maintenance versus Kadcyla maintenance (based on response to neoadjuvant chemo) for 1 year 2. Followed by breast conserving surgery if possible with sentinel lymph node study 3. Followed by adjuvant radiation therapy if patient had lumpectomy  Chemotherapy Counseling: I discussed the risks and benefits of chemotherapy including the risks of nausea/ vomiting, risk of infection from low WBC count, fatigue due to chemo or anemia, bruising or bleeding due to low platelets, mouth sores, loss/ change in taste and decreased appetite. Liver and kidney function will be monitored through out chemotherapy as abnormalities in liver and kidney function may be a side effect of treatment. Cardiac dysfunction due to Herceptin and Perjeta and neuropathy risk from Taxotere were discussed in detail. Risk of permanent bone marrow dysfunction due to chemo were also discussed.  Plan: 1. Port placement 2. Echocardiogram 3. Chemotherapy class 4. Breast MRI We discussed enrollment in the nausea study  Return to clinic in 2 weeks to start chemotherapy.

## 2021-04-15 NOTE — Progress Notes (Signed)
error 

## 2021-04-15 NOTE — Therapy (Signed)
OUTPATIENT PHYSICAL THERAPY BREAST CANCER BASELINE EVALUATION   Patient Name: Tara White MRN: [id number] DOB:Jul 04, 1990, 31 y.o., female Today's Date: 04/15/2021   PT End of Session - 04/15/21 1704     Visit Number 1    Number of Visits 2    Date for PT Re-Evaluation 10/13/21    PT Start Time 5643    PT Stop Time 1431   Also saw pt from [id number] for a total of 40 min   PT Time Calculation (min) 20 min    Activity Tolerance Patient tolerated treatment well    Behavior During Therapy [facility] for tasks assessed/performed             Past Medical History:  Diagnosis Date   Pregnancy induced hypertension    Past Surgical History:  Procedure Laterality Date   CESAREAN SECTION N/A 12/10/2015   Procedure: CESAREAN SECTION;  Surgeon: Aloha Gell, MD;  Location: [place];  Service: Obstetrics;  Laterality: N/A;   WISDOM TOOTH EXTRACTION     Patient Active Problem List   Diagnosis Date Noted   Malignant neoplasm of upper-outer quadrant of right breast in female, estrogen receptor negative ([place]) 04/14/2021   Hypertension in pregnancy, preeclampsia, severe, delivered/postpartum 12/16/2015   Pre-eclampsia 12/15/2015   Postpartum care following cesarean delivery (9/13) 12/11/2015   Preeclampsia, severe 12/09/2015    PCP: Patient, No Pcp Per (Inactive)  REFERRING PROVIDER: Coralie Keens, MD  REFERRING DIAG: Right breast cancer  THERAPY DIAG:  Malignant neoplasm of upper-outer quadrant of right breast in female, estrogen receptor negative ([place])  Abnormal posture  ONSET DATE: 04/08/2021  SUBJECTIVE                                                                                                                                                                                           SUBJECTIVE STATEMENT: Patient reports she is here today to be seen by her medical team for her newly diagnosed right breast cancer.   PERTINENT HISTORY:  Patient was diagnosed on  04/08/2021 with right grade III invasive ductal carcinoma breast cancer. It measures 3 cm with 10.8 cm of calcifications and is located in the upper outer quadrant. It is ER/PR negative and HER2 positive with a Ki67 of 30%. She has a biopsied positive axillary lymph node.   PATIENT GOALS   reduce lymphedema risk and learn post op HEP.   PAIN:  Are you having pain? No  PRECAUTIONS: Active CA  WEIGHT BEARING RESTRICTIONS No  FALLS:  Has patient fallen in last 6 months? No, Number of falls: 0  LIVING ENVIRONMENT: Patient lives with: husband, 1, 3, and 5  y.o. kids Lives in: House/apartment Has following equipment at home: None  OCCUPATION: Stays at home with her children  LEISURE: She does not exercise  PRIOR LEVEL OF FUNCTION: Independent   OBJECTIVE  COGNITION:  Overall cognitive status: Within functional limits for tasks assessed    POSTURE:  Forward head and rounded shoulders posture  UPPER EXTREMITY AROM/PROM:  A/PROM Right 04/15/2021 Left 04/15/2021  Shoulder extension 36 45  Shoulder flexion 169 155  Shoulder abduction 177 173  Shoulder internal rotation 75 68  Shoulder external rotation 83 84    (Blank rows = not tested)    CERVICAL AROM: All within normal limits  UPPER EXTREMITY STRENGTH: WNL   LYMPHEDEMA ASSESSMENTS:   LANDMARK RIGHT 04/15/2021 LEFT 04/15/2021  10 cm proximal to olecranon process 26.1 26.8  Olecranon process 23.3 24.3  10 cm proximal to ulnar styloid process 21.6 21.4  Just proximal to ulnar styloid process 15.5 15.7  Across hand at thumb web space 19.2 19.6  At base of 2nd digit 6.2 6.3  (Blank rows = not tested)   L-DEX LYMPHEDEMA SCREENING:  The patient was assessed using the L-Dex machine today to produce a lymphedema index baseline score. The patient will be reassessed on a regular basis (typically every 3 months) to obtain new L-Dex scores. If the score is > 6.5 points away from his/her baseline score indicating onset of  subclinical lymphedema, it will be recommended to wear a compression garment for 4 weeks, 12 hours per day and then be reassessed. If the score continues to be > 6.5 points from baseline at reassessment, we will initiate lymphedema treatment. Assessing in this manner has a 95% rate of preventing clinically significant lymphedema.  L-DEX LYMPHEDEMA SCREENING Measurement Type: Unilateral L-DEX MEASUREMENT EXTREMITY: Upper Extremity POSITION : Standing DOMINANT SIDE: Right At Risk Side: Right BASELINE SCORE (UNILATERAL): -7.4  QUICK DASH SURVEY:  Katina Dung - 04/15/21 0001     Open a tight or new jar No difficulty    Do heavy household chores (wash walls, wash floors) No difficulty    Carry a shopping bag or briefcase No difficulty    Wash your back No difficulty    Use a knife to cut food No difficulty    Recreational activities in which you take some force or impact through your arm, shoulder, or hand (golf, hammering, tennis) No difficulty    During the past week, to what extent has your arm, shoulder or hand problem interfered with your normal social activities with family, friends, neighbors, or groups? Not at all    During the past week, to what extent has your arm, shoulder or hand problem limited your work or other regular daily activities Not at all    Arm, shoulder, or hand pain. None    Tingling (pins and needles) in your arm, shoulder, or hand None    Difficulty Sleeping No difficulty    DASH Score 0 %              PATIENT EDUCATION:  Education details: Lymphedema risk reduction and post op shoulder/posture HEP Person educated: Patient Education method: Explanation, Demonstration, Handout Education comprehension: Patient verbalized understanding and returned demonstration   HOME EXERCISE PROGRAM: Patient was instructed today in a home exercise program today for post op shoulder range of motion. These included active assist shoulder flexion in sitting, scapular  retraction, wall walking with shoulder abduction, and hands behind head external rotation.  She was encouraged to do these twice a day,  holding 3 seconds and repeating 5 times when permitted by her physician.   ASSESSMENT:  CLINICAL IMPRESSION: Patient was diagnosed on 04/08/2021 with right grade III invasive ductal carcinoma breast cancer. It measures 3 cm with 10.8 cm of calcifications and is located in the upper outer quadrant. It is ER/PR negative and HER2 positive with a Ki67 of 30%. She has a biopsied positive axillary lymph node. Her multidisciplinary medical team met prior to her assessments to determine a recommended treatment plan. She is planning to have neoadjuvant chemotherapy. She will benefit from a post op PT reassessment to determine needs and from L-Dex screens every 3 months for 2 years to detect subclinical lymphedema.  Pt will benefit from skilled therapeutic intervention to improve on the following deficits: Decreased knowledge of precautions, impaired UE functional use, pain, decreased ROM, postural dysfunction.   PT treatment/interventions: ADL/self-care home management, pt/family education, therapeutic exercise  REHAB POTENTIAL: Excellent  CLINICAL DECISION MAKING: Stable/uncomplicated  EVALUATION COMPLEXITY: Low   GOALS: Goals reviewed with patient? YES  LONG TERM GOALS: (STG=LTG)   Name Target Date Goal status  1 Pt will be able to verbalize understanding of pertinent lymphedema risk reduction practices relevant to her dx specifically related to skin care.  Baseline:  No knowledge 04/15/2021 Achieved at eval  2 Pt will be able to return demo and/or verbalize understanding of the post op HEP related to regaining shoulder ROM. Baseline:  No knowledge 04/15/2021 Achieved at eval  3 Pt will be able to verbalize understanding of the importance of attending the post op After Breast CA Class for further lymphedema risk reduction education and therapeutic exercise.   Baseline:  No knowledge 04/15/2021 Achieved at eval  4 Pt will demo she has regained full shoulder ROM and function post operatively compared to baselines.  Baseline: See objective measurements taken today. 10/13/2021      PLAN: PT FREQUENCY/DURATION: EVAL and 1 follow up appointment.   PLAN FOR NEXT SESSION: will reassess 3-4 weeks post op to determine needs.   Patient will follow up at outpatient cancer rehab 3-4 weeks following surgery.  If the patient requires physical therapy at that time, a specific plan will be dictated and sent to the referring physician for approval. The patient was educated today on appropriate basic range of motion exercises to begin post operatively and the importance of attending the After Breast Cancer class following surgery.  Patient was educated today on lymphedema risk reduction practices as it pertains to recommendations that will benefit the patient immediately following surgery.  She verbalized good understanding.    Physical Therapy Information for After Breast Cancer Surgery/Treatment:  Lymphedema is a swelling condition that you may be at risk for in your arm if you have lymph nodes removed from the armpit area.  After a sentinel node biopsy, the risk is approximately 5-9% and is higher after an axillary node dissection.  There is treatment available for this condition and it is not life-threatening.  Contact your physician or physical therapist with concerns. You may begin the 4 shoulder/posture exercises (see additional sheet) when permitted by your physician (typically a week after surgery).  If you have drains, you may need to wait until those are removed before beginning range of motion exercises.  A general recommendation is to not lift your arms above shoulder height until drains are removed.  These exercises should be done to your tolerance and gently.  This is not a "no pain/no gain" type of recovery so listen to  your body and stretch into the range  of motion that you can tolerate, stopping if you have pain.  If you are having immediate reconstruction, ask your plastic surgeon about doing exercises as he or she may want you to wait. We encourage you to attend the free one time ABC (After Breast Cancer) class offered by [facility].  You will learn information related to lymphedema risk, prevention and treatment and additional exercises to regain mobility following surgery.  You can call [phone] for more information.  This is offered the 1st and 3rd Monday of each month.  You only attend the class one time. While undergoing any medical procedure or treatment, try to avoid blood pressure being taken or needle sticks from occurring on the arm on the side of cancer.   This recommendation begins after surgery and continues for the rest of your life.  This may help reduce your risk of getting lymphedema (swelling in your arm). An excellent resource for those seeking information on lymphedema is the National Lymphedema Network's web site. It can be accessed at [place].org If you notice swelling in your hand, arm or breast at any time following surgery (even if it is many years from now), please contact your doctor or physical therapist to discuss this.  Lymphedema can be treated at any time but it is easier for you if it is treated early on.  If you feel like your shoulder motion is not returning to normal in a reasonable amount of time, please contact your surgeon or physical therapist.  [facility] [place], [place], [place] [phone]; [address] [place], [place] [address] ABC CLASS After Breast Cancer Class  After Breast Cancer Class is a specially designed exercise class to assist you in a safe recover after having breast cancer surgery.  In this class you will learn how to get back to full function whether your drains were just removed or if you had surgery a month ago.  This one-time class is held the 1st and 3rd Monday of  every month from 11:00 a.m. until 12:00 noon at the [facility] located at [place], [place] [address]  This class is FREE and space is limited. For more information or to register for the next available class, call [phone].  Class Goals  Understand specific stretches to improve the flexibility of you chest and shoulder. Learn ways to safely strengthen your upper body and improve your posture. Understand the warning signs of infection and why you may be at risk for an arm infection. Learn about Lymphedema and prevention.  ** You do not attend this class until after surgery.  Drains must be removed to participate  Patient was instructed today in a home exercise program today for post op shoulder range of motion. These included active assist shoulder flexion in sitting, scapular retraction, wall walking with shoulder abduction, and hands behind head external rotation.  She was encouraged to do these twice a day, holding 3 seconds and repeating 5 times when permitted by her physician.  Annia Friendly, [place] 04/15/21 5:11 PM

## 2021-04-16 ENCOUNTER — Telehealth: Payer: Self-pay | Admitting: *Deleted

## 2021-04-16 NOTE — Telephone Encounter (Signed)
Spoke with patient and gave information regarding the dignicap.  She is not sure yet if she will do this. I told to let me know when she makes a decision because this will increase or time for chemo appt. Patient verbalized understanding.

## 2021-04-17 ENCOUNTER — Ambulatory Visit (INDEPENDENT_AMBULATORY_CARE_PROVIDER_SITE_OTHER): Payer: Commercial Managed Care - PPO | Admitting: Plastic Surgery

## 2021-04-17 ENCOUNTER — Encounter: Payer: Self-pay | Admitting: Plastic Surgery

## 2021-04-17 ENCOUNTER — Encounter: Payer: Self-pay | Admitting: Radiation Oncology

## 2021-04-17 ENCOUNTER — Telehealth: Payer: Self-pay | Admitting: *Deleted

## 2021-04-17 ENCOUNTER — Encounter: Payer: Self-pay | Admitting: *Deleted

## 2021-04-17 ENCOUNTER — Other Ambulatory Visit: Payer: Self-pay

## 2021-04-17 VITALS — BP 130/83 | HR 78 | Ht 69.0 in | Wt 155.2 lb

## 2021-04-17 DIAGNOSIS — C50411 Malignant neoplasm of upper-outer quadrant of right female breast: Secondary | ICD-10-CM | POA: Diagnosis not present

## 2021-04-17 DIAGNOSIS — Z171 Estrogen receptor negative status [ER-]: Secondary | ICD-10-CM | POA: Diagnosis not present

## 2021-04-17 NOTE — Progress Notes (Signed)
Patient ID: Tara White, female    DOB: 1990/07/27, 31 y.o.   MRN: [id number]   Chief Complaint  Patient presents with   Advice Only   Breast Cancer    The patient is a 31 year old female here for a consultation for breast reconstruction.  She was diagnosed with right breast invasive ductal carcinoma for a abnormal mammogram and biopsy January 11.  She has a right axillary lymph node that is positive for metastatic cancer.  The cancer is estrogen and progesterone negative and HER2 positive stage IIB.  She is 5 feet 9 inches tall and weighs 155 pounds.  Her preoperative bra size is B/C. Her past surgical history is c-section.   Review of Systems  Constitutional: Negative.   HENT: Negative.    Eyes: Negative.   Respiratory: Negative.    Cardiovascular: Negative.   Gastrointestinal: Negative.   Endocrine: Negative.   Genitourinary: Negative.   Musculoskeletal: Negative.   Skin: Negative.   Neurological: Negative.   Hematological: Negative.   Psychiatric/Behavioral: Negative.     Past Medical History:  Diagnosis Date   Family history of breast cancer 04/21/2021   Pregnancy induced hypertension     Past Surgical History:  Procedure Laterality Date   CESAREAN SECTION N/A 12/10/2015   Procedure: CESAREAN SECTION;  Surgeon: Aloha Gell, MD;  Location: [place];  Service: Obstetrics;  Laterality: N/A;   WISDOM TOOTH EXTRACTION        Current Outpatient Medications:    dexamethasone (DECADRON) 4 MG tablet, Take 2 tablets (8 mg total) by mouth 2 (two) times daily. Take 1 tablet day before chemo and 1 tablet day after chemo with food, Disp: 12 tablet, Rfl: 0   Homeopathic Products (FRANKINCENSE UPLIFTING) OIL, Apply 2 drops topically., Disp: , Rfl:    lidocaine-prilocaine (EMLA) cream, Apply to affected area once, Disp: 30 g, Rfl: 3   LORazepam (ATIVAN) 0.5 MG tablet, Take 1 tablet (0.5 mg total) by mouth at bedtime as needed (Nausea or vomiting)., Disp: 30 tablet, Rfl:  0   ondansetron (ZOFRAN) 8 MG tablet, Take 1 tablet (8 mg total) by mouth 2 (two) times daily as needed (Nausea or vomiting). Start on the third day after chemotherapy., Disp: 30 tablet, Rfl: 1   prochlorperazine (COMPAZINE) 10 MG tablet, Take 1 tablet (10 mg total) by mouth every 6 (six) hours as needed (Nausea or vomiting)., Disp: 30 tablet, Rfl: 1   Objective:   Vitals:   04/17/21 1351  BP: 130/83  Pulse: 78  SpO2: 100%    Physical Exam Vitals reviewed.  Constitutional:      Appearance: Normal appearance.  HENT:     Head: Normocephalic and atraumatic.  Cardiovascular:     Rate and Rhythm: Normal rate.     Pulses: Normal pulses.  Pulmonary:     Effort: Pulmonary effort is normal.  Abdominal:     General: There is no distension.     Palpations: Abdomen is soft.     Tenderness: There is no abdominal tenderness.  Musculoskeletal:        General: No swelling or deformity.  Skin:    General: Skin is warm.     Capillary Refill: Capillary refill takes less than 2 seconds.     Coloration: Skin is not jaundiced.     Findings: No bruising.  Neurological:     Mental Status: She is alert and oriented to person, place, and time.  Psychiatric:  Mood and Affect: Mood normal.        Behavior: Behavior normal.        Thought Content: Thought content normal.        Judgment: Judgment normal.    Assessment & Plan:  Malignant neoplasm of upper-outer quadrant of right breast in female, estrogen receptor negative ([place])  The options for reconstruction we explained to the patient / family for breast reconstruction.  There are two general categories of reconstruction.  We can reconstruction a breast with implants or use the patient's own tissue.  These were further discussed as listed.  Breast reconstruction is an optional procedure and eligibility depends on the full spectrum of the health of the patient and any co-morbidities.  More than one surgery is often needed to complete the  reconstruction process.  The process can take three to twelve months to complete.  The breasts will not be identical due to many factors such as rib differences, shoulder asymmetry and treatments such as radiation.  The goal is to get the breasts to look normal and symmetrical in clothes.  Scars are a part of surgery and may fade some in time but will always be present under clothes.  Surgery may be an option on the non-cancer breast to achieve more symmetry.  No matter which procedure is chosen there is always the risk of complications and even failure of the body to heal.  This could result in no breast.    The options for reconstruction include:  1. Placement of a tissue expander with Acellular dermal matrix. When the expander is the desired size surgery is performed to remove the expander and place an implant.  In some cases the implant can be placed without an expander.  2. Autologous reconstruction can include using a muscle or tissue from another area of the body to create a breast.  3. Combined procedures (ie. latissismus dorsi flap) can be done with an expander / implant placed under the muscle.   The risks, benefits, scars and recovery time were discussed for each of the above. Risks include bleeding, infection, hematoma, seroma, scarring, pain, wound healing complications, flap loss, fat necrosis, capsular contracture, need for implant removal, donor site complications, bulge, hernia, umbilical necrosis, need for urgent reoperation, and need for dressing changes.   The procedure the patient selected / that was best for the patient, was then discussed in further detail.  Total time: 45 minutes. This includes time spent with the patient during the visit as well as time spent before and after the visit reviewing the chart, documenting the encounter, making phone calls and reviewing studies.   The patient is still waiting on some information before she makes her final decision.  She is leaning  toward mastectomy with implant based reconstruction.  We will plan a televisit in 1-2 weeks to discuss further.  Dr. Ninfa Linden is her surgeon.  Pictures were obtained of the patient and placed in the chart with the patient's or guardian's permission.   [place], DO

## 2021-04-17 NOTE — Telephone Encounter (Signed)
Spoke with patient to follow up from [facility] 1/18 and assess navigation needs as well as give her some appt changes.  Informed her that the echo has been moved up to 1/31 at 8am at [facility]. The plan will be to place port on 2/2 and start chemo 2/3- schedule message has been sent regarding this. Patient verbalized understanding.

## 2021-04-20 ENCOUNTER — Encounter: Payer: Self-pay | Admitting: *Deleted

## 2021-04-20 ENCOUNTER — Telehealth: Payer: Self-pay | Admitting: Genetic Counselor

## 2021-04-20 NOTE — Telephone Encounter (Signed)
Called patient to follow up about genetics appointment.  LVM requesting call back with questions or if she wishes to proceed with genetics.

## 2021-04-21 ENCOUNTER — Telehealth: Payer: Self-pay | Admitting: *Deleted

## 2021-04-21 ENCOUNTER — Telehealth: Payer: Self-pay | Admitting: Hematology and Oncology

## 2021-04-21 ENCOUNTER — Encounter: Payer: Self-pay | Admitting: Hematology and Oncology

## 2021-04-21 ENCOUNTER — Encounter: Payer: Self-pay | Admitting: Genetic Counselor

## 2021-04-21 DIAGNOSIS — Z1379 Encounter for other screening for genetic and chromosomal anomalies: Secondary | ICD-10-CM | POA: Insufficient documentation

## 2021-04-21 DIAGNOSIS — Z803 Family history of malignant neoplasm of breast: Secondary | ICD-10-CM | POA: Insufficient documentation

## 2021-04-21 HISTORY — DX: Family history of malignant neoplasm of breast: Z80.3

## 2021-04-21 NOTE — Telephone Encounter (Signed)
Received call from patient with questions regarding chemotherapy and surgery. Patient is just feeling a bit overwhelmed and needed some support and reassurance. We discussed the reasoning for proceeding with chemo 1st prior to surgery.  Patient is still undecided on whether she wants bil mast or not. Informed her that there is time to think about that decision since she will have chemotherapy 1st. Patient verbalized understanding and very appreciative. Encouraged her to call should she have any more questions or concerns.

## 2021-04-21 NOTE — Telephone Encounter (Signed)
Sch per 1/19 inbasket, left msg

## 2021-04-21 NOTE — Progress Notes (Signed)
REFERRING PROVIDER: Nicholas Lose, MD [place],  [place] [id number]  PRIMARY PROVIDER:  Group, [facility]  PRIMARY REASON FOR VISIT:  1. Malignant neoplasm of upper-outer quadrant of right breast in female, estrogen receptor negative ([place])   2. Family history of breast cancer   3. Genetic testing    HISTORY OF PRESENT ILLNESS:   Tara White, a 31 y.o. female, was seen for a [place] cancer genetics consultation at the request of Dr. Lindi Adie due to a personal and family history of breast cancer.  Tara White presents to clinic today to discuss the possibility of a hereditary predisposition to cancer, to discuss genetic testing, and to further clarify her future cancer risks, as well as potential cancer risks for family members.   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 The treatment plan is pending.    CANCER HISTORY:  Oncology History  Malignant neoplasm of upper-outer quadrant of right breast in female, estrogen receptor negative ([place])  04/08/2021 Initial Diagnosis   History of right breast eczematous change to the nipple, patient had palpable lump in the right breast her mammogram 2.1 cm spiculated mass with extensive calcifications that span 11 cm.  Ultrasound revealed 3 cm mass at 10:00 plus small satellite lesions.  Biopsy revealed grade 3 IDC ER 0%, PR 0%, HER2 3+ positive, Ki-67 30%   04/08/2021 Cancer Staging   Staging form: Breast, AJCC 8th Edition - Clinical stage from 04/08/2021: Stage IIB (cT2, cN1, cM0, G3, ER-, PR-, HER2+) - Signed by Nicholas Lose, MD on 04/15/2021 Stage prefix: Initial diagnosis Method of lymph node assessment: Axillary lymph node dissection Histologic grading system: 3 grade system    04/27/2021 -  Chemotherapy   Patient is on Treatment Plan : BREAST  Docetaxel + Carboplatin + Trastuzumab + Pertuzumab  (TCHP) q21d        RISK FACTORS:   Menarche was at age 9.  First live birth at age 34.  OCP use for approximately 3 years.  Ovaries intact: yes.  Hysterectomy: no.  Menopausal status: premenopausal.  HRT use: 0 years. Colonoscopy: no; not examined. Mammogram within the last year: yes. Up to date with pelvic exams: most recent PAP in September 2020.  Past Medical History:  Diagnosis Date   Family history of breast cancer 04/21/2021   Pregnancy induced hypertension     Past Surgical History:  Procedure Laterality Date   CESAREAN SECTION N/A 12/10/2015   Procedure: CESAREAN SECTION;  Surgeon: Aloha Gell, MD;  Location: [facility];  Service: Obstetrics;  Laterality: N/A;   WISDOM TOOTH EXTRACTION      FAMILY HISTORY:  We obtained a detailed, 4-generation family history.  Significant diagnoses are listed below: Family History  Problem Relation Age of Onset   Breast cancer Maternal Grandmother 24   Skin cancer Maternal Grandfather     Tara White is unaware of previous family history of genetic testing for hereditary cancer risks. Patient's maternal ancestors are of [place] European descent, and paternal ancestors are of [place] European descent. There is no reported Ashkenazi Jewish ancestry. There is no known consanguinity.  GENETIC COUNSELING ASSESSMENT: Tara White is a 31 y.o. female with a personal and family history of breast cancer which is somewhat suggestive of a hereditary cancer syndrome and predisposition to cancer given the ages of diagnosis and the presence of related cancers in multiple generations of the family. We, therefore, discussed and  recommended the following at today's visit.   DISCUSSION: We discussed that 5 - 10% of cancer is hereditary.  Most cases of hereditary breast cancer are associated with mutations in BRCA1/2.  There are other genes that can be associated with hereditary breast cancer syndromes.  We discussed that testing is beneficial for several reasons including knowing  how to follow individuals for their cancer risks, identifying whether potential treatment options would be beneficial, and understanding if other family members could be at risk for cancer and allowing them to undergo genetic testing.   We reviewed the characteristics, features and inheritance patterns of hereditary cancer syndromes. We also discussed genetic testing, including the appropriate family members to test, the process of testing, insurance coverage and turn-around-time for results. We discussed the implications of a negative, positive, carrier and/or variant of uncertain significant result. We recommended Tara White pursue genetic testing for a panel that includes genes associated with breast cancer.   PLAN: Tara White did not wish to pursue genetic testing at today's visit.  She wanted to take more time to consider testing and talking further about it with her husband.  She may consider genetic testing at a later point in treatment. We understand this decision and remain available to coordinate genetic testing at any time in the future. We, therefore, recommend Tara White continue to follow the cancer screening guidelines given by her primary healthcare provider.  Lastly, we encouraged Tara White to remain in contact with cancer genetics annually so that we can continuously update the family history and inform her of any changes in cancer genetics and testing that may be of benefit for this family.   Tara White questions were answered to her satisfaction today. Our contact information was provided should additional questions or concerns arise. Thank you for the referral and allowing [place] to share in the care of your patient.   Tara White M. [name], [place], [facility] Genetic Counselor [email or web address] (P) [phone]  The patient was seen for a total of 20 minutes in face-to-face genetic counseling.  The patient was accompanied by her husband. Drs. Lindi Adie and/or Burr Medico were available to discuss  this case as needed.    ______________________________________________________________________ For Office Staff:  Number of people involved in session: 2 Was an Intern/ student involved with case: yes; Lorenza Burton, [place] intern

## 2021-04-22 ENCOUNTER — Encounter (HOSPITAL_BASED_OUTPATIENT_CLINIC_OR_DEPARTMENT_OTHER): Payer: Self-pay | Admitting: Surgery

## 2021-04-23 ENCOUNTER — Encounter: Payer: Self-pay | Admitting: Hematology and Oncology

## 2021-04-23 ENCOUNTER — Other Ambulatory Visit: Payer: Self-pay

## 2021-04-23 ENCOUNTER — Ambulatory Visit (HOSPITAL_COMMUNITY)
Admission: RE | Admit: 2021-04-23 | Discharge: 2021-04-23 | Disposition: A | Discharge status: Home / Self Care | Payer: Commercial Managed Care - PPO | Source: Ambulatory Visit | Attending: Hematology and Oncology | Admitting: Hematology and Oncology

## 2021-04-23 ENCOUNTER — Inpatient Hospital Stay: Payer: Commercial Managed Care - PPO

## 2021-04-23 DIAGNOSIS — C50411 Malignant neoplasm of upper-outer quadrant of right female breast: Secondary | ICD-10-CM | POA: Diagnosis not present

## 2021-04-23 DIAGNOSIS — Z171 Estrogen receptor negative status [ER-]: Secondary | ICD-10-CM | POA: Insufficient documentation

## 2021-04-23 MED ORDER — GADOBUTROL 1 MMOL/ML IV SOLN
7.0000 mL | Freq: Once | INTRAVENOUS | Status: AC | PRN
  Administered 2021-04-23: 7 mL via INTRAVENOUS

## 2021-04-23 NOTE — Progress Notes (Signed)
I will monitor for copay assistance if needed. Patient has several upcoming test in which ded/OOP may be met not requiring additional assistance.

## 2021-04-23 NOTE — Progress Notes (Signed)
Met with patient and spouse at registration to introduce myself as Arboriculturist and to offer available resources.  Discussed one-time $1000 Radio broadcast assistant to assist with personal expenses while going through treatment.  Also, discussed available copay assistance for specific treatment drugs. They are interested in applying being that insurance ded/OOP renewed this year. Advised I would apply on her behalf.   Gave my card for any additional financial questions or concerns.

## 2021-04-24 NOTE — Progress Notes (Signed)
Pharmacist Chemotherapy Monitoring - Initial Assessment    Anticipated start date: 05/01/21  The following has been reviewed per standard work regarding the patient's treatment regimen: The patient's diagnosis, treatment plan and drug doses, and organ/hematologic function Lab orders and baseline tests specific to treatment regimen  The treatment plan start date, drug sequencing, and pre-medications Prior authorization status  Patient's documented medication list, including drug-drug interaction screen and prescriptions for anti-emetics and supportive care specific to the treatment regimen The drug concentrations, fluid compatibility, administration routes, and timing of the medications to be used The patient's access for treatment and lifetime cumulative dose history, if applicable  The patient's medication allergies and previous infusion related reactions, if applicable   Changes made to treatment plan:  N/A  Follow up needed:  Pending authorization for treatment    Larene Beach, RPH, 04/24/2021  1:57 PM

## 2021-04-27 ENCOUNTER — Encounter: Payer: Self-pay | Admitting: *Deleted

## 2021-04-27 ENCOUNTER — Encounter: Payer: Self-pay | Admitting: Hematology and Oncology

## 2021-04-27 ENCOUNTER — Telehealth: Payer: Self-pay | Admitting: *Deleted

## 2021-04-27 ENCOUNTER — Encounter (HOSPITAL_COMMUNITY)
Admission: RE | Admit: 2021-04-27 | Discharge: 2021-04-27 | Disposition: A | Discharge status: Home / Self Care | Payer: Commercial Managed Care - PPO | Source: Ambulatory Visit | Attending: Hematology and Oncology | Admitting: Hematology and Oncology

## 2021-04-27 ENCOUNTER — Other Ambulatory Visit: Payer: Self-pay

## 2021-04-27 ENCOUNTER — Encounter (HOSPITAL_COMMUNITY): Payer: Self-pay

## 2021-04-27 DIAGNOSIS — Z171 Estrogen receptor negative status [ER-]: Secondary | ICD-10-CM

## 2021-04-27 DIAGNOSIS — R590 Localized enlarged lymph nodes: Secondary | ICD-10-CM | POA: Insufficient documentation

## 2021-04-27 DIAGNOSIS — N6489 Other specified disorders of breast: Secondary | ICD-10-CM | POA: Insufficient documentation

## 2021-04-27 DIAGNOSIS — C50411 Malignant neoplasm of upper-outer quadrant of right female breast: Secondary | ICD-10-CM | POA: Insufficient documentation

## 2021-04-27 DIAGNOSIS — R911 Solitary pulmonary nodule: Secondary | ICD-10-CM | POA: Diagnosis not present

## 2021-04-27 MED ORDER — IOHEXOL 300 MG/ML  SOLN
100.0000 mL | Freq: Once | INTRAMUSCULAR | Status: AC | PRN
  Administered 2021-04-27: 100 mL via INTRAVENOUS

## 2021-04-27 MED ORDER — TECHNETIUM TC 99M MEDRONATE IV KIT
20.0000 | PACK | Freq: Once | INTRAVENOUS | Status: AC | PRN
  Administered 2021-04-27: 20.5 via INTRAVENOUS

## 2021-04-27 MED ORDER — SODIUM CHLORIDE (PF) 0.9 % IJ SOLN
INTRAMUSCULAR | Status: AC
  Filled 2021-04-27: qty 50

## 2021-04-27 MED ORDER — ENSURE PRE-SURGERY PO LIQD: 296.0000 mL | Freq: Once | ORAL | Status: DC

## 2021-04-27 NOTE — Telephone Encounter (Signed)
Spoke with patient to confirm appts for lab @9 :30am and Dr. Lindi Adie 1/31 at 10am.  Also her chemo start time will be  @ 7:30am 2/3. Patient verbalized understanding.

## 2021-04-27 NOTE — Progress Notes (Signed)

## 2021-04-27 NOTE — Progress Notes (Signed)
Patient Care Team: Group, [facility] as PCP - General Mauro Kaufmann, RN as Oncology Nurse Navigator Rockwell Germany, RN as Oncology Nurse Navigator Nicholas Lose, MD as Consulting Physician (Hematology and Oncology) Coralie Keens, MD as Consulting Physician (General Surgery) Eppie Gibson, MD as Attending Physician (Radiation Oncology)  DIAGNOSIS:    ICD-10-CM   1. Malignant neoplasm of upper-outer quadrant of right breast in female, estrogen receptor negative ([place])  C50.411    Z17.1       SUMMARY OF ONCOLOGIC HISTORY: Oncology History  Malignant neoplasm of upper-outer quadrant of right breast in female, estrogen receptor negative ([place])  04/08/2021 Initial Diagnosis   History of right breast eczematous change to the nipple, patient had palpable lump in the right breast her mammogram 2.1 cm spiculated mass with extensive calcifications that span 11 cm.  Ultrasound revealed 3 cm mass at 10:00 plus small satellite lesions.  Biopsy revealed grade 3 IDC ER 0%, PR 0%, HER2 3+ positive, Ki-67 30%   04/08/2021 Cancer Staging   Staging form: Breast, AJCC 8th Edition - Clinical stage from 04/08/2021: Stage IIB (cT2, cN1, cM0, G3, ER-, PR-, HER2+) - Signed by Nicholas Lose, MD on 04/15/2021 Stage prefix: Initial diagnosis Method of lymph node assessment: Axillary lymph node dissection Histologic grading system: 3 grade system    05/01/2021 -  Chemotherapy   Patient is on Treatment Plan : BREAST  Docetaxel + Carboplatin + Trastuzumab + Pertuzumab  (TCHP) q21d        CHIEF COMPLIANT: Follow-up of right breast cancer  INTERVAL HISTORY: Devory Mckinzie is a 31 y.o. with above-mentioned history of right breast cancer. She presents to the clinic today for follow-up.  She underwent CT chest abdomen pelvis and bone scan and is here today to discuss results.  She also performed echocardiogram this morning.  Her treatment is going to start her next Friday.  She is using dignicap  ALLERGIES:   has No Known Allergies.  MEDICATIONS:  Current Outpatient Medications  Medication Sig Dispense Refill   dexamethasone (DECADRON) 4 MG tablet Take 2 tablets (8 mg total) by mouth 2 (two) times daily. Take 1 tablet day before chemo and 1 tablet day after chemo with food 12 tablet 0   Homeopathic Products (FRANKINCENSE UPLIFTING) OIL Apply 2 drops topically.     lidocaine-prilocaine (EMLA) cream Apply to affected area once 30 g 3   LORazepam (ATIVAN) 0.5 MG tablet Take 1 tablet (0.5 mg total) by mouth at bedtime as needed (Nausea or vomiting). 30 tablet 0   ondansetron (ZOFRAN) 8 MG tablet Take 1 tablet (8 mg total) by mouth 2 (two) times daily as needed (Nausea or vomiting). Start on the third day after chemotherapy. 30 tablet 1   prochlorperazine (COMPAZINE) 10 MG tablet Take 1 tablet (10 mg total) by mouth every 6 (six) hours as needed (Nausea or vomiting). 30 tablet 1   No current facility-administered medications for this visit.   Facility-Administered Medications Ordered in Other Visits  Medication Dose Route Frequency Provider Last Rate Last Admin   feeding supplement (ENSURE PRE-SURGERY) liquid 296 mL  296 mL Oral Once Coralie Keens, MD        PHYSICAL EXAMINATION: ECOG PERFORMANCE STATUS: 1 - Symptomatic but completely ambulatory  Vitals:   04/28/21 1002  BP: (!) 144/81  Pulse: 69  Resp: 18  Temp: 97.9 F (36.6 C)  SpO2: 100%   Filed Weights   04/28/21 1002  Weight: 156 lb 12.8 oz (71.1 kg)  LABORATORY DATA:  I have reviewed the data as listed CMP Latest Ref Rng & Units 04/28/2021 04/15/2021 12/17/2015  Glucose 70 - 99 mg/dL 83 119(H) 86  BUN 6 - 20 mg/dL _0 Creatinine 0.44 - 1.00 mg/dL 0.94 0.95 0.90  Sodium 135 - 145 mmol/L 139 141 138  Potassium 3.5 - 5.1 mmol/L 4.0 3.8 4.2  Chloride 98 - 111 mmol/L 107 105 108  CO2 22 - 32 mmol/L _1 Calcium 8.9 - 10.3 mg/dL 9.8 10.0 8.6(L)  Total Protein 6.5 - 8.1 g/dL 7.7 7.9 6.3(L)  Total Bilirubin 0.3 -  1.2 mg/dL 0.4 0.8 0.4  Alkaline Phos 38 - 126 U/L 49 54 91  AST 15 - 41 U/L _2 ALT 0 - 44 U/L 14 14 34    Lab Results  Component Value Date   WBC 7.4 04/28/2021   HGB 14.4 04/28/2021   HCT 42.5 04/28/2021   MCV 87.6 04/28/2021   PLT 283 04/28/2021   NEUTROABS 5.0 04/28/2021    ASSESSMENT & PLAN:  Malignant neoplasm of upper-outer quadrant of right breast in female, estrogen receptor negative ([place]) 04/08/2021:History of right breast eczematous change to the nipple, patient had palpable lump in the right breast her mammogram 2.1 cm spiculated mass with extensive calcifications that span 11 cm.  Ultrasound revealed 3 cm mass at 10:00 plus small satellite lesions.  Biopsy revealed grade 3 IDC ER 0%, PR 0%, HER2 3+ positive, Ki-67 30%  Recommendation: 1. Neoadjuvant chemotherapy with TCH Perjeta 6 cycles followed by Herceptin Perjeta maintenance versus Kadcyla maintenance (based on response to neoadjuvant chemo) for 1 year 2. Followed by mastectomy with reconstruction 3. Followed by adjuvant radiation therapy    Breast MRI 04/27/2021: Extensive non-mass enhancement throughout all quadrants of the breast largest confluent area 2.9 cm, right nipple enhancement consistent with DCIS, 1.9 cm right axillary lymph node, left breast negative ----------------------------------------------------------------------------------------------------------------------------------------------- CT CAP and bone scan: To my review there does not appear to be any evidence of metastatic disease. Current treatment: Cycle 1 neoadjuvant chemo TCH Perjeta start 05/01/2021 Labs reviewed, chemo consent obtained, chemo education completed Echocardiogram: Done today 04/28/2021  She had several questions today related to unilateral mastectomy versus bilateral mastectomies.  I recommended that she do genetic testing to help with that decision.  Previously she did not want to do genetics but she is now willing to do it.   I sent a message to our genetic counselors.  Return to clinic on cycle 1 day 8 to assess tolerance to treatment.   No orders of the defined types were placed in this encounter.  The patient has a good understanding of the overall plan. she agrees with it. she will call with any problems that may develop before the next visit here.  Total time spent: 30 mins including face to face time and time spent for planning, charting and coordination of care  Rulon Eisenmenger, MD, MPH 04/28/2021  I, Thana Ates, am acting as scribe for Dr. Nicholas Lose.  I have reviewed the above documentation for accuracy and completeness, and I agree with the above.

## 2021-04-27 NOTE — Telephone Encounter (Signed)
Received call from patient needing clarification on when to drink contrast for her CT today. Instructed her on the times for drinking contrast. Patient verbalized understanding.  She has also decided to use the dignicap. Informed her I would get the schedule changed to reflect the increase in appt time. Also gave instructions on ordering the dignicap and delta card.  Let her know that if her kit did not come in time that we had extra for her to use. Informed patient to be here 2/3 at 730 for infusion and I would get her appts for lab and Dr. Lindi Adie r/s for earlier in the week and will let her know when this has been r/s

## 2021-04-28 ENCOUNTER — Inpatient Hospital Stay: Payer: Commercial Managed Care - PPO

## 2021-04-28 ENCOUNTER — Other Ambulatory Visit: Payer: Self-pay | Admitting: Genetic Counselor

## 2021-04-28 ENCOUNTER — Ambulatory Visit (HOSPITAL_COMMUNITY)
Admission: RE | Admit: 2021-04-28 | Discharge: 2021-04-28 | Disposition: A | Discharge status: Home / Self Care | Payer: Commercial Managed Care - PPO | Source: Ambulatory Visit | Attending: Hematology and Oncology | Admitting: Hematology and Oncology

## 2021-04-28 ENCOUNTER — Inpatient Hospital Stay (HOSPITAL_BASED_OUTPATIENT_CLINIC_OR_DEPARTMENT_OTHER): Payer: Commercial Managed Care - PPO | Admitting: Hematology and Oncology

## 2021-04-28 ENCOUNTER — Encounter: Payer: Self-pay | Admitting: *Deleted

## 2021-04-28 DIAGNOSIS — Z0189 Encounter for other specified special examinations: Secondary | ICD-10-CM

## 2021-04-28 DIAGNOSIS — C50411 Malignant neoplasm of upper-outer quadrant of right female breast: Secondary | ICD-10-CM

## 2021-04-28 DIAGNOSIS — Z171 Estrogen receptor negative status [ER-]: Secondary | ICD-10-CM | POA: Insufficient documentation

## 2021-04-28 DIAGNOSIS — Z01818 Encounter for other preprocedural examination: Secondary | ICD-10-CM | POA: Insufficient documentation

## 2021-04-28 LAB — CBC WITH DIFFERENTIAL/PLATELET
Abs Immature Granulocytes: 0.02 10*3/uL (ref 0.00–0.07)
Basophils Absolute: 0 10*3/uL (ref 0.0–0.1)
Basophils Relative: 0 %
Eosinophils Absolute: 0.1 10*3/uL (ref 0.0–0.5)
Eosinophils Relative: 1 %
HCT: 42.5 % (ref 36.0–46.0)
Hemoglobin: 14.4 g/dL (ref 12.0–15.0)
Immature Granulocytes: 0 %
Lymphocytes Relative: 28 %
Lymphs Abs: 2.1 10*3/uL (ref 0.7–4.0)
MCH: 29.7 pg (ref 26.0–34.0)
MCHC: 33.9 g/dL (ref 30.0–36.0)
MCV: 87.6 fL (ref 80.0–100.0)
Monocytes Absolute: 0.3 10*3/uL (ref 0.1–1.0)
Monocytes Relative: 4 %
Neutro Abs: 5 10*3/uL (ref 1.7–7.7)
Neutrophils Relative %: 67 %
Platelets: 283 10*3/uL (ref 150–400)
RBC: 4.85 MIL/uL (ref 3.87–5.11)
RDW: 12.5 % (ref 11.5–15.5)
WBC: 7.4 10*3/uL (ref 4.0–10.5)
nRBC: 0 % (ref 0.0–0.2)

## 2021-04-28 LAB — ECHOCARDIOGRAM COMPLETE
AR max vel: 1.24 cm2
AV Area VTI: 1.22 cm2
AV Area mean vel: 1.18 cm2
AV Mean grad: 6 mmHg
AV Peak grad: 10.1 mmHg
Ao pk vel: 1.59 m/s
Area-P 1/2: 1.44 cm2
Calc EF: 66.3 %
MV VTI: 0.34 cm2
S' Lateral: 2.5 cm
Single Plane A2C EF: 73.4 %
Single Plane A4C EF: 59.8 %

## 2021-04-28 LAB — COMPREHENSIVE METABOLIC PANEL
ALT: 14 U/L (ref 0–44)
AST: 17 U/L (ref 15–41)
Albumin: 4.8 g/dL (ref 3.5–5.0)
Alkaline Phosphatase: 49 U/L (ref 38–126)
Anion gap: 6 (ref 5–15)
BUN: 18 mg/dL (ref 6–20)
CO2: 26 mmol/L (ref 22–32)
Calcium: 9.8 mg/dL (ref 8.9–10.3)
Chloride: 107 mmol/L (ref 98–111)
Creatinine, Ser: 0.94 mg/dL (ref 0.44–1.00)
GFR, Estimated: >60 mL/min (ref >60)
Glucose, Bld: 83 mg/dL (ref 70–99)
Potassium: 4 mmol/L (ref 3.5–5.1)
Sodium: 139 mmol/L (ref 135–145)
Total Bilirubin: 0.4 mg/dL (ref 0.3–1.2)
Total Protein: 7.7 g/dL (ref 6.5–8.1)

## 2021-04-28 NOTE — Progress Notes (Signed)
° °  Echocardiogram 2D Echocardiogram has been performed.  Tara White 04/28/2021, 9:46 AM

## 2021-04-28 NOTE — Assessment & Plan Note (Signed)
04/08/2021:History of right breast eczematous change to the nipple, patient had palpable lump in the right breast her mammogram 2.1 cm spiculated mass with extensive calcifications that span 11 cm.  Ultrasound revealed 3 cm mass at 10:00 plus small satellite lesions.  Biopsy revealed grade 3 IDC ER 0%, PR 0%, HER2 3+ positive, Ki-67 30%  Recommendation: 1. Neoadjuvant chemotherapy with TCH Perjeta 6 cycles followed by Herceptin Perjeta maintenance versus Kadcyla maintenance (based on response to neoadjuvant chemo) for 1 year 2. Followed by breast conserving surgery if possible with sentinel lymph node study 3. Followed by adjuvant radiation therapy if patient had lumpectomy   Breast MRI 04/27/2021: Extensive non-mass enhancement throughout all quadrants of the breast largest confluent area 2.9 cm, right nipple enhancement consistent with DCIS, 1.9 cm right axillary lymph node, left breast negative ----------------------------------------------------------------------------------------------------------------------------------------------- CT CAP and bone scan: Pending results Current treatment: Cycle 1 neoadjuvant chemo TCH Perjeta start 05/01/2021 Labs reviewed, chemo consent obtained, chemo education completed Echocardiogram

## 2021-04-29 ENCOUNTER — Encounter: Payer: Self-pay | Admitting: Hematology and Oncology

## 2021-04-29 ENCOUNTER — Other Ambulatory Visit: Payer: Self-pay | Admitting: *Deleted

## 2021-04-29 DIAGNOSIS — C50411 Malignant neoplasm of upper-outer quadrant of right female breast: Secondary | ICD-10-CM

## 2021-04-29 NOTE — H&P (Signed)
PROVIDER: Beverlee Nims, MD  MRN: [id number] DOB: Feb 14, 1991 DATE OF ENCOUNTER: 04/15/2021 Subjective   Chief Complaint: Breast Cancer   History of Present Illness: Tara White is a 31 y.o. female who is seen today as an office consultation for evaluation of Breast Cancer .   This is a 31 year old female that presents with a palpable right breast mass. She had noticed it during pregnancy. She is also had an area of suspected eczema or nipple. She has since had mammograms and ultrasounds and biopsies of her breast. Ultrasound showed the 3 x 2 x 1.6 cm mass in the breast. There was axillary adenopathy. There is also extensive calcification in the breast with a total area measured 10 cm x 10 cm x 7.5 cm. She underwent a biopsy of the mass showing invasive ductal carcinoma which was ER/PR negative, HER2 positive, and had a Ki-67 of 30%. She has had no previous problems regarding her breast. She is otherwise healthy without complaints. There is no family history of breast cancer.  Review of Systems: A complete review of systems was obtained from the patient. I have reviewed this information and discussed as appropriate with the patient. See HPI as well for other ROS.  ROS   Medical History: Past Medical History:  Diagnosis Date   History of cancer   There is no problem list on file for this patient.  Past Surgical History:  Procedure Laterality Date   CESAREAN SECTION  c-section x 3   PHOTOREFRACTIVE KERATOTOMY/LASIK   wisdom teeth    No Known Allergies  No current outpatient medications on file prior to visit.   No current facility-administered medications on file prior to visit.   Family History  Problem Relation Age of Onset   Thyroid disease Mother    Social History   Tobacco Use  Smoking Status Never  Smokeless Tobacco Never    Social History   Socioeconomic History   Marital status: Unknown  Tobacco Use   Smoking status: Never   Smokeless tobacco:  Never  Substance and Sexual Activity   Alcohol use: Yes   Drug use: Not Currently   Objective:  Afebrile, BP 110/80, HR 80, weight 71 kg, BMI 23.16 Physical Exam   She appears well on exam.  There is a 3 cm mass in the upper outer quadrant of the right breast with ecchymosis from biopsy. It does not feel fixed. There is a small wound which has the appearance of granulation tissue right on the nipple. There is no scaling of the nipple areolar complex. The left breast is normal  There is a large, mobile lymph node in her right axilla.  Labs, Imaging and Diagnostic Testing: I reviewed her mammograms, ultrasound, pathology results  Assessment and Plan:   Invasive ductal carcinoma of the right breast  We have discussed her at length in a multidisciplinary breast cancer conference this morning. Because of the size of the mass and the positive lymph node, neoadjuvant chemotherapy is recommended. She will also be undergoing a metastatic work-up. Because of the need for neoadjuvant therapy, Port-A-Cath insertion is also recommended. I discussed breast cancer with her and her husband in detail. We discussed the reasonings for neoadjuvant therapy. This would be to reduce the likelihood of metastatic disease and need for completion axillary dissection. Regardless, she will still need a mastectomy because of the significant extent of the calcifications and suspected large area of ductal carcinoma in situ. I again discussed reasons for this with them as well.  We next discussed Port-A-Cath insertion. We discussed the risk which includes but is not limited to bleeding, infection, injury to surrounding structures, pneumothorax, the need for further procedures, cardiopulmonary issues, etc. She would also like to be referred to plastic surgeons for further discussion of her ultimate surgical plans. Port-A-Cath will be scheduled soon as possible.   Electronically signed by Beverlee Nims, MD at  04/15/2021 2:21 PM EST

## 2021-04-29 NOTE — Progress Notes (Signed)
The following biosimilar Ziextenzo (pegfilgrastim-bmez) and Kanjinti (trastuzumab-anns) has been selected for use in this patient.  Kennith Center, Pharm.D., CPP 04/29/2021@12 :56 PM

## 2021-04-30 ENCOUNTER — Encounter (HOSPITAL_BASED_OUTPATIENT_CLINIC_OR_DEPARTMENT_OTHER): Payer: Self-pay | Admitting: Surgery

## 2021-04-30 ENCOUNTER — Ambulatory Visit (HOSPITAL_COMMUNITY): Payer: Commercial Managed Care - PPO

## 2021-04-30 ENCOUNTER — Other Ambulatory Visit: Payer: Self-pay

## 2021-04-30 ENCOUNTER — Ambulatory Visit (HOSPITAL_BASED_OUTPATIENT_CLINIC_OR_DEPARTMENT_OTHER): Payer: Commercial Managed Care - PPO | Admitting: Anesthesiology

## 2021-04-30 ENCOUNTER — Ambulatory Visit (HOSPITAL_COMMUNITY)
Admission: RE | Admit: 2021-04-30 | Discharge: 2021-04-30 | Disposition: A | Discharge status: Home / Self Care | Payer: Commercial Managed Care - PPO | Attending: Surgery | Admitting: Surgery

## 2021-04-30 ENCOUNTER — Encounter (HOSPITAL_BASED_OUTPATIENT_CLINIC_OR_DEPARTMENT_OTHER)
Admission: RE | Disposition: A | Discharge status: Home / Self Care | Payer: Self-pay | Source: Home / Self Care | Attending: Surgery

## 2021-04-30 DIAGNOSIS — C50911 Malignant neoplasm of unspecified site of right female breast: Secondary | ICD-10-CM | POA: Diagnosis not present

## 2021-04-30 DIAGNOSIS — Z452 Encounter for adjustment and management of vascular access device: Secondary | ICD-10-CM

## 2021-04-30 DIAGNOSIS — Z171 Estrogen receptor negative status [ER-]: Secondary | ICD-10-CM | POA: Diagnosis not present

## 2021-04-30 HISTORY — PX: PORTACATH PLACEMENT: SHX2246

## 2021-04-30 LAB — POCT PREGNANCY, URINE: Preg Test, Ur: NEGATIVE

## 2021-04-30 SURGERY — INSERTION, TUNNELED CENTRAL VENOUS DEVICE, WITH PORT
Anesthesia: General | Site: Chest | Laterality: Left

## 2021-04-30 MED ORDER — CHLORHEXIDINE GLUCONATE CLOTH 2 % EX PADS: 6.0000 | MEDICATED_PAD | Freq: Once | CUTANEOUS | Status: DC

## 2021-04-30 MED ORDER — CEFAZOLIN SODIUM-DEXTROSE 2-4 GM/100ML-% IV SOLN
INTRAVENOUS | Status: AC
  Filled 2021-04-30: qty 100

## 2021-04-30 MED ORDER — ONDANSETRON HCL 4 MG/2ML IJ SOLN
INTRAMUSCULAR | Status: DC | PRN
  Administered 2021-04-30: 4 mg via INTRAVENOUS

## 2021-04-30 MED ORDER — MIDAZOLAM HCL 5 MG/5ML IJ SOLN
INTRAMUSCULAR | Status: DC | PRN
  Administered 2021-04-30: 2 mg via INTRAVENOUS

## 2021-04-30 MED ORDER — DEXAMETHASONE SODIUM PHOSPHATE 4 MG/ML IJ SOLN
INTRAMUSCULAR | Status: DC | PRN
  Administered 2021-04-30: 5 mg via INTRAVENOUS

## 2021-04-30 MED ORDER — MIDAZOLAM HCL 2 MG/2ML IJ SOLN
INTRAMUSCULAR | Status: AC
  Filled 2021-04-30: qty 2

## 2021-04-30 MED ORDER — BUPIVACAINE-EPINEPHRINE 0.5% -1:200000 IJ SOLN
INTRAMUSCULAR | Status: DC | PRN
  Administered 2021-04-30: 6 mL

## 2021-04-30 MED ORDER — HEPARIN SOD (PORK) LOCK FLUSH 100 UNIT/ML IV SOLN
INTRAVENOUS | Status: AC
  Filled 2021-04-30: qty 5

## 2021-04-30 MED ORDER — HEPARIN (PORCINE) IN NACL 2-0.9 UNITS/ML
INTRAMUSCULAR | Status: AC | PRN
  Administered 2021-04-30: 1 via INTRAVENOUS

## 2021-04-30 MED ORDER — CEFAZOLIN SODIUM-DEXTROSE 2-4 GM/100ML-% IV SOLN
2.0000 g | INTRAVENOUS | Status: AC
  Administered 2021-04-30: 2 g via INTRAVENOUS

## 2021-04-30 MED ORDER — FENTANYL CITRATE (PF) 100 MCG/2ML IJ SOLN: 25.0000 ug | INTRAMUSCULAR | Status: DC | PRN

## 2021-04-30 MED ORDER — ACETAMINOPHEN 500 MG PO TABS
ORAL_TABLET | ORAL | Status: AC
  Filled 2021-04-30: qty 2

## 2021-04-30 MED ORDER — HEPARIN SOD (PORK) LOCK FLUSH 100 UNIT/ML IV SOLN
INTRAVENOUS | Status: DC | PRN
Start: 2021-04-30 — End: 2021-04-30
  Administered 2021-04-30: 500 [IU] via INTRAVENOUS

## 2021-04-30 MED ORDER — LACTATED RINGERS IV SOLN: INTRAVENOUS | Status: DC

## 2021-04-30 MED ORDER — FENTANYL CITRATE (PF) 100 MCG/2ML IJ SOLN
INTRAMUSCULAR | Status: DC | PRN
  Administered 2021-04-30: 100 ug via INTRAVENOUS

## 2021-04-30 MED ORDER — PROPOFOL 10 MG/ML IV BOLUS
INTRAVENOUS | Status: DC | PRN
  Administered 2021-04-30: 20 mg via INTRAVENOUS

## 2021-04-30 MED ORDER — LIDOCAINE 2% (20 MG/ML) 5 ML SYRINGE
INTRAMUSCULAR | Status: DC | PRN
  Administered 2021-04-30: 60 mg via INTRAVENOUS

## 2021-04-30 MED ORDER — PROMETHAZINE HCL 25 MG/ML IJ SOLN: 6.2500 mg | INTRAMUSCULAR | Status: DC | PRN

## 2021-04-30 MED ORDER — HEPARIN (PORCINE) IN NACL 1000-0.9 UT/500ML-% IV SOLN
INTRAVENOUS | Status: AC
  Filled 2021-04-30: qty 500

## 2021-04-30 MED ORDER — HYDROCODONE-ACETAMINOPHEN 5-325 MG PO TABS: 1.0000 | ORAL_TABLET | Freq: Four times a day (QID) | ORAL | 0 refills | Status: DC | PRN

## 2021-04-30 MED ORDER — ACETAMINOPHEN 500 MG PO TABS
1000.0000 mg | ORAL_TABLET | ORAL | Status: AC
  Administered 2021-04-30: 1000 mg via ORAL

## 2021-04-30 MED ORDER — FENTANYL CITRATE (PF) 100 MCG/2ML IJ SOLN
INTRAMUSCULAR | Status: AC
  Filled 2021-04-30: qty 2

## 2021-04-30 MED FILL — Dexamethasone Sodium Phosphate Inj 100 MG/10ML: INTRAMUSCULAR | Qty: 1 | Status: AC

## 2021-04-30 MED FILL — Fosaprepitant Dimeglumine For IV Infusion 150 MG (Base Eq): INTRAVENOUS | Qty: 5 | Status: AC

## 2021-04-30 SURGICAL SUPPLY — 42 items
ADH SKN CLS APL DERMABOND .7 (GAUZE/BANDAGES/DRESSINGS) ×2
APL PRP STRL LF DISP 70% ISPRP (MISCELLANEOUS) ×1
BAG DECANTER FOR FLEXI CONT (MISCELLANEOUS) ×2 IMPLANT
BLADE SURG 15 STRL LF DISP TIS (BLADE) ×1 IMPLANT
BLADE SURG 15 STRL SS (BLADE) ×2
CANISTER SUCT 1200ML W/VALVE (MISCELLANEOUS) IMPLANT
CHLORAPREP W/TINT 26 (MISCELLANEOUS) ×2 IMPLANT
COVER BACK TABLE 60X90IN (DRAPES) ×2 IMPLANT
COVER MAYO STAND STRL (DRAPES) ×2 IMPLANT
DECANTER SPIKE VIAL GLASS SM (MISCELLANEOUS) IMPLANT
DERMABOND ADVANCED (GAUZE/BANDAGES/DRESSINGS) ×2
DERMABOND ADVANCED .7 DNX12 (GAUZE/BANDAGES/DRESSINGS) ×2 IMPLANT
DRAPE C-ARM 42X72 X-RAY (DRAPES) ×2 IMPLANT
DRAPE LAPAROSCOPIC ABDOMINAL (DRAPES) ×2 IMPLANT
DRAPE UTILITY XL STRL (DRAPES) ×2 IMPLANT
DRSG TEGADERM 4X4.75 (GAUZE/BANDAGES/DRESSINGS) ×2 IMPLANT
ELECT REM PT RETURN 9FT ADLT (ELECTROSURGICAL) ×2
ELECTRODE REM PT RTRN 9FT ADLT (ELECTROSURGICAL) ×1 IMPLANT
GLOVE SURG POLYISO LF SZ6.5 (GLOVE) ×1 IMPLANT
GLOVE SURG SIGNA 7.5 PF LTX (GLOVE) ×2 IMPLANT
GLOVE SURG UNDER POLY LF SZ7 (GLOVE) ×1 IMPLANT
GOWN STRL REUS W/ TWL LRG LVL3 (GOWN DISPOSABLE) ×1 IMPLANT
GOWN STRL REUS W/ TWL XL LVL3 (GOWN DISPOSABLE) ×1 IMPLANT
GOWN STRL REUS W/TWL LRG LVL3 (GOWN DISPOSABLE) ×4
GOWN STRL REUS W/TWL XL LVL3 (GOWN DISPOSABLE) ×2
IV KIT MINILOC 20X1 SAFETY (NEEDLE) IMPLANT
KIT PORT POWER 8FR ISP CVUE (Port) ×1 IMPLANT
NDL HYPO 25X1 1.5 SAFETY (NEEDLE) ×1 IMPLANT
NEEDLE HYPO 25X1 1.5 SAFETY (NEEDLE) ×2 IMPLANT
PACK BASIN DAY SURGERY FS (CUSTOM PROCEDURE TRAY) ×2 IMPLANT
PENCIL SMOKE EVACUATOR (MISCELLANEOUS) ×2 IMPLANT
SLEEVE SCD COMPRESS KNEE MED (STOCKING) ×2 IMPLANT
SUT MNCRL AB 4-0 PS2 18 (SUTURE) ×2 IMPLANT
SUT PROLENE 2 0 SH DA (SUTURE) ×2 IMPLANT
SUT SILK 2 0 TIES 17X18 (SUTURE)
SUT SILK 2-0 18XBRD TIE BLK (SUTURE) IMPLANT
SUT VIC AB 3-0 SH 27 (SUTURE) ×2
SUT VIC AB 3-0 SH 27X BRD (SUTURE) ×1 IMPLANT
SYR CONTROL 10ML LL (SYRINGE) ×2 IMPLANT
TOWEL GREEN STERILE FF (TOWEL DISPOSABLE) ×2 IMPLANT
TUBE CONNECTING 20X1/4 (TUBING) IMPLANT
YANKAUER SUCT BULB TIP NO VENT (SUCTIONS) IMPLANT

## 2021-04-30 NOTE — Discharge Instructions (Addendum)
You may shower tomorrow after the port has been deaccessed  Tylenol also for pain  No vigorous activity for 1 week   Post Anesthesia Home Care Instructions  Activity: Get plenty of rest for the remainder of the day. A responsible individual must stay with you for 24 hours following the procedure.  For the next 24 hours, DO NOT: -Drive a car -Paediatric nurse -Drink alcoholic beverages -Take any medication unless instructed by your physician -Make any legal decisions or sign important papers.  Meals: Start with liquid foods such as gelatin or soup. Progress to regular foods as tolerated. Avoid greasy, spicy, heavy foods. If nausea and/or vomiting occur, drink only clear liquids until the nausea and/or vomiting subsides. Call your physician if vomiting continues.  Special Instructions/Symptoms: Your throat may feel dry or sore from the anesthesia or the breathing tube placed in your throat during surgery. If this causes discomfort, gargle with warm salt water. The discomfort should disappear within 24 hours.  If you had a scopolamine patch placed behind your ear for the management of post- operative nausea and/or vomiting:  1. The medication in the patch is effective for 72 hours, after which it should be removed.  Wrap patch in a tissue and discard in the trash. Wash hands thoroughly with soap and water. 2. You may remove the patch earlier than 72 hours if you experience unpleasant side effects which may include dry mouth, dizziness or visual disturbances. 3. Avoid touching the patch. Wash your hands with soap and water after contact with the patch.  Next dose of Tylenol after 3pm as needed for pain.

## 2021-04-30 NOTE — Transfer of Care (Signed)
Immediate Anesthesia Transfer of Care Note  Patient: Tara White  Procedure(s) Performed: INSERTION PORT-A-CATH (Left: Chest)  Patient Location: PACU  Anesthesia Type:General  Level of Consciousness: awake, alert  and oriented  Airway & Oxygen Therapy: Patient Spontanous Breathing and Patient connected to face mask oxygen  Post-op Assessment: Report given to RN and Post -op Vital signs reviewed and stable  Post vital signs: Reviewed and stable  Last Vitals:  Vitals Value Taken Time  BP 100/59 04/30/21 1005  Temp    Pulse 75 04/30/21 1007  Resp 12 04/30/21 1007  SpO2 100 % 04/30/21 1007  Vitals shown include unvalidated device data.  Last Pain:  Vitals:   04/30/21 0850  TempSrc: Oral  PainSc:          Complications: No notable events documented.

## 2021-04-30 NOTE — Anesthesia Postprocedure Evaluation (Signed)
Anesthesia Post Note  Patient: Tara White  Procedure(s) Performed: INSERTION PORT-A-CATH (Left: Chest)     Patient location during evaluation: PACU Anesthesia Type: General Level of consciousness: awake and alert Pain management: pain level controlled Vital Signs Assessment: post-procedure vital signs reviewed and stable Respiratory status: spontaneous breathing, nonlabored ventilation, respiratory function stable and patient connected to nasal cannula oxygen Cardiovascular status: blood pressure returned to baseline and stable Postop Assessment: no apparent nausea or vomiting Anesthetic complications: no   No notable events documented.  Last Vitals:  Vitals:   04/30/21 1015 04/30/21 1030  BP: 112/67 (!) 120/91  Pulse: 79 64  Resp: (!) 23   Temp:  36.6 C  SpO2: 95% 100%    Last Pain:  Vitals:   04/30/21 1030  TempSrc:   PainSc: 0-No pain                 Santa Lighter

## 2021-04-30 NOTE — Anesthesia Preprocedure Evaluation (Addendum)
Anesthesia Evaluation  Patient identified by MRN, date of birth, ID band Patient awake    Reviewed: Allergy & Precautions, NPO status , Patient's Chart, lab work & pertinent test results  Airway Mallampati: I  TM Distance: >3 FB Neck ROM: Full    Dental  (+) Teeth Intact, Dental Advisory Given, Chipped,    Pulmonary neg pulmonary ROS,    Pulmonary exam normal breath sounds clear to auscultation       Cardiovascular negative cardio ROS Normal cardiovascular exam Rhythm:Regular Rate:Normal     Neuro/Psych negative neurological ROS  negative psych ROS   GI/Hepatic negative GI ROS, Neg liver ROS,   Endo/Other  negative endocrine ROS  Renal/GU negative Renal ROS     Musculoskeletal negative musculoskeletal ROS (+)   Abdominal   Peds  Hematology negative hematology ROS (+)   Anesthesia Other Findings Day of surgery medications reviewed with the patient.  Right breast cancer   Reproductive/Obstetrics negative OB ROS                            Anesthesia Physical Anesthesia Plan  ASA: 2  Anesthesia Plan: General   Post-op Pain Management: Tylenol PO (pre-op)   Induction: Intravenous  PONV Risk Score and Plan: 3 and Midazolam, Dexamethasone and Ondansetron  Airway Management Planned: LMA  Additional Equipment:   Intra-op Plan:   Post-operative Plan: Extubation in OR  Informed Consent: I have reviewed the patients History and Physical, chart, labs and discussed the procedure including the risks, benefits and alternatives for the proposed anesthesia with the patient or authorized representative who has indicated his/her understanding and acceptance.     Dental advisory given  Plan Discussed with: CRNA  Anesthesia Plan Comments:         Anesthesia Quick Evaluation

## 2021-04-30 NOTE — Op Note (Signed)
INSERTION PORT-A-CATH  Procedure Note  Tara White 04/30/2021   Pre-op Diagnosis: BREAST CANCER     Post-op Diagnosis: same  Procedure(s): INSERTION PORT-A-CATH LEFT SUBCLAVIAN VEIN (8 FR)  Surgeon(s): Coralie Keens, MD  Anesthesia: General  Staff:  Circulator: Ted Mcalpine, RN Radiology Technologist: Luetta Nutting Scrub Person: Lorenza Burton, CST  Estimated Blood Loss: Minimal               Procedure: The patient brought to operating identifies correct patient.  She is placed upon the operating table general anesthesia was induced.  Her left chest and neck were prepped and draped in usual sterile fashion.  I anesthetized skin the left chest with lidocaine including the clavicle.  I then used the introducer needle to easily cannulate the left subclavian vein with the patient in Trendelenburg.  I passed a wire through the needle and into the central venous system under direct fluoroscopy.  The needle was then removed.  I anesthetized skin further with Marcaine.  I then created incision at the wire site transversely with a scalpel.  I then dissected down to the subcutaneous tissue with the cautery.  I then created a pocket for the Port-A-Cath with the cautery.  An 8 French [place] port was brought to the field and flushed appropriately.  The catheter was connected to the port.  I passed the venous dilator introducer sheath over the wire and into the central venous system easily.  The wire and dilator were then removed.  The port fit into the pocket.  I cut the catheter appropriate length.  The catheter was then fed down the peel-away sheath.  The sheath was then peeled away leaving the catheter in the central venous system.  I accessed the port and good flush and return were demonstrated with heparinized saline solution.  The port was then sewn in place with 3-0 Prolene sutures.  Fluoroscopy confirmed placement in the superior vena cava.  We then closed subcutaneous tissue  with interrupted 3-0 Vicryl sutures and closed the skin with a running 4-0 Monocryl.  Dermabond was then applied.  We again accessed the port through the skin and good flush and return were demonstrated.  It was instilled with concentrated heparin solution and left accessed.  A bulky dressing was then applied.  The patient tolerated the procedure well.  All the counts were correct at the end of the procedure.  The patient was then extubated in the operating room and taken in stable addition to the recovery room.          Coralie Keens   Date: 04/30/2021  Time: 9:59 AM

## 2021-04-30 NOTE — Anesthesia Procedure Notes (Signed)
Procedure Name: LMA Insertion Date/Time: 04/30/2021 9:25 AM Performed by: Willa Frater, CRNA Pre-anesthesia Checklist: Patient identified, Emergency Drugs available, Suction available and Patient being monitored Patient Re-evaluated:Patient Re-evaluated prior to induction Oxygen Delivery Method: Circle system utilized Preoxygenation: Pre-oxygenation with 100% oxygen Induction Type: IV induction Ventilation: Mask ventilation without difficulty LMA: LMA inserted LMA Size: 4.0 Number of attempts: 1 Airway Equipment and Method: Bite block Placement Confirmation: positive ETCO2 Tube secured with: Tape Dental Injury: Teeth and Oropharynx as per pre-operative assessment

## 2021-04-30 NOTE — Interval H&P Note (Signed)
History and Physical Interval Note: no change in H and P  04/30/2021 8:55 AM  Tara White  has presented today for surgery, with the diagnosis of BREAST CANCER.  The various methods of treatment have been discussed with the patient and family. After consideration of risks, benefits and other options for treatment, the patient has consented to  Procedure(s): INSERTION PORT-A-CATH (N/A) as a surgical intervention.  The patient's history has been reviewed, patient examined, no change in status, stable for surgery.  I have reviewed the patient's chart and labs.  Questions were answered to the patient's satisfaction.     Coralie Keens

## 2021-05-01 ENCOUNTER — Inpatient Hospital Stay: Payer: Commercial Managed Care - PPO

## 2021-05-01 ENCOUNTER — Encounter: Payer: Self-pay | Admitting: *Deleted

## 2021-05-01 ENCOUNTER — Encounter (HOSPITAL_BASED_OUTPATIENT_CLINIC_OR_DEPARTMENT_OTHER): Payer: Self-pay | Admitting: Surgery

## 2021-05-01 ENCOUNTER — Inpatient Hospital Stay: Payer: Commercial Managed Care - PPO | Attending: Hematology and Oncology

## 2021-05-01 ENCOUNTER — Inpatient Hospital Stay: Payer: Commercial Managed Care - PPO | Admitting: Hematology and Oncology

## 2021-05-01 VITALS — BP 127/76 | HR 70 | Temp 98.4°F | Resp 16 | Wt 158.8 lb

## 2021-05-01 DIAGNOSIS — Z79899 Other long term (current) drug therapy: Secondary | ICD-10-CM | POA: Insufficient documentation

## 2021-05-01 DIAGNOSIS — Z9011 Acquired absence of right breast and nipple: Secondary | ICD-10-CM | POA: Insufficient documentation

## 2021-05-01 DIAGNOSIS — Z171 Estrogen receptor negative status [ER-]: Secondary | ICD-10-CM | POA: Insufficient documentation

## 2021-05-01 DIAGNOSIS — Z5111 Encounter for antineoplastic chemotherapy: Secondary | ICD-10-CM | POA: Insufficient documentation

## 2021-05-01 DIAGNOSIS — C50411 Malignant neoplasm of upper-outer quadrant of right female breast: Secondary | ICD-10-CM | POA: Insufficient documentation

## 2021-05-01 MED ORDER — SODIUM CHLORIDE 0.9 % IV SOLN: Freq: Once | INTRAVENOUS | Status: AC

## 2021-05-01 MED ORDER — ACETAMINOPHEN 325 MG PO TABS
650.0000 mg | ORAL_TABLET | Freq: Once | ORAL | Status: AC
  Administered 2021-05-01: 650 mg via ORAL
  Filled 2021-05-01: qty 2

## 2021-05-01 MED ORDER — PALONOSETRON HCL INJECTION 0.25 MG/5ML
0.2500 mg | Freq: Once | INTRAVENOUS | Status: AC
  Administered 2021-05-01: 0.25 mg via INTRAVENOUS
  Filled 2021-05-01: qty 5

## 2021-05-01 MED ORDER — SODIUM CHLORIDE 0.9 % IV SOLN
150.0000 mg | Freq: Once | INTRAVENOUS | Status: AC
  Administered 2021-05-01: 150 mg via INTRAVENOUS
  Filled 2021-05-01: qty 150

## 2021-05-01 MED ORDER — SODIUM CHLORIDE 0.9 % IV SOLN
729.0000 mg | Freq: Once | INTRAVENOUS | Status: AC
  Administered 2021-05-01: 730 mg via INTRAVENOUS
  Filled 2021-05-01: qty 73

## 2021-05-01 MED ORDER — SODIUM CHLORIDE 0.9 % IV SOLN
10.0000 mg | Freq: Once | INTRAVENOUS | Status: AC
  Administered 2021-05-01: 10 mg via INTRAVENOUS
  Filled 2021-05-01: qty 10

## 2021-05-01 MED ORDER — DIPHENHYDRAMINE HCL 25 MG PO CAPS
50.0000 mg | ORAL_CAPSULE | Freq: Once | ORAL | Status: AC
  Administered 2021-05-01: 50 mg via ORAL
  Filled 2021-05-01: qty 2

## 2021-05-01 MED ORDER — SODIUM CHLORIDE 0.9 % IV SOLN
420.0000 mg | Freq: Once | INTRAVENOUS | Status: AC
  Administered 2021-05-01: 420 mg via INTRAVENOUS
  Filled 2021-05-01: qty 14

## 2021-05-01 MED ORDER — SODIUM CHLORIDE 0.9% FLUSH
10.0000 mL | INTRAVENOUS | Status: DC | PRN
  Administered 2021-05-01: 10 mL

## 2021-05-01 MED ORDER — SODIUM CHLORIDE 0.9 % IV SOLN
75.0000 mg/m2 | Freq: Once | INTRAVENOUS | Status: AC
  Administered 2021-05-01: 140 mg via INTRAVENOUS
  Filled 2021-05-01: qty 14

## 2021-05-01 MED ORDER — HEPARIN SOD (PORK) LOCK FLUSH 100 UNIT/ML IV SOLN
500.0000 [IU] | Freq: Once | INTRAVENOUS | Status: AC | PRN
  Administered 2021-05-01: 500 [IU]

## 2021-05-01 MED ORDER — TRASTUZUMAB-ANNS CHEMO 150 MG IV SOLR
8.0000 mg/kg | Freq: Once | INTRAVENOUS | Status: AC
  Administered 2021-05-01: 567 mg via INTRAVENOUS
  Filled 2021-05-01: qty 27

## 2021-05-01 NOTE — Patient Instructions (Signed)
[facility]  Discharge Instructions: Thank you for choosing [facility] to provide your oncology and hematology care.   If you have a lab appointment with the [facility], please go directly to the [facility] and check in at the registration area.   Wear comfortable clothing and clothing appropriate for easy access to any Portacath or PICC line.   We strive to give you quality time with your provider. You may need to reschedule your appointment if you arrive late (15 or more minutes).  Arriving late affects you and other patients whose appointments are after yours.  Also, if you miss three or more appointments without notifying the office, you may be dismissed from the clinic at the providers discretion.      For prescription refill requests, have your pharmacy contact our office and allow 72 hours for refills to be completed.    Today you received the following chemotherapy and/or immunotherapy agents trastuzumab, pertuzumab, docetaxel, carboplatin       To help prevent nausea and vomiting after your treatment, we encourage you to take your nausea medication as directed.  BELOW ARE SYMPTOMS THAT SHOULD BE REPORTED IMMEDIATELY: *FEVER GREATER THAN 100.4 F (38 C) OR HIGHER *CHILLS OR SWEATING *NAUSEA AND VOMITING THAT IS NOT CONTROLLED WITH YOUR NAUSEA MEDICATION *UNUSUAL SHORTNESS OF BREATH *UNUSUAL BRUISING OR BLEEDING *URINARY PROBLEMS (pain or burning when urinating, or frequent urination) *BOWEL PROBLEMS (unusual diarrhea, constipation, pain near the anus) TENDERNESS IN MOUTH AND THROAT WITH OR WITHOUT PRESENCE OF ULCERS (sore throat, sores in mouth, or a toothache) UNUSUAL RASH, SWELLING OR PAIN  UNUSUAL VAGINAL DISCHARGE OR ITCHING   Items with * indicate a potential emergency and should be followed up as soon as possible or go to the Emergency Department if any problems should occur.  Please show the CHEMOTHERAPY ALERT CARD or  IMMUNOTHERAPY ALERT CARD at check-in to the Emergency Department and triage nurse.  Should you have questions after your visit or need to cancel or reschedule your appointment, please contact [facility]  Dept: [phone]  and follow the prompts.  Office hours are 8:00 a.m. to 4:30 p.m. Monday - Friday. Please note that voicemails left after 4:00 p.m. may not be returned until the following business day.  We are closed weekends and major holidays. You have access to a nurse at all times for urgent questions. Please call the main number to the clinic Dept: [phone] and follow the prompts.   For any non-urgent questions, you may also contact your provider using MyChart. We now offer e-Visits for anyone 19 and older to request care online for non-urgent symptoms. For details visit [email or web address].   Also download the MyChart app! Go to the app store, search "MyChart", open the app, select [facility], and log in with your MyChart username and password.  Due to Covid, a mask is required upon entering the hospital/clinic. If you do not have a mask, one will be given to you upon arrival. For doctor visits, patients may have 1 support person aged 8 or older with them. For treatment visits, patients cannot have anyone with them due to current Covid guidelines and our immunocompromised population.   Trastuzumab injection for infusion What is this medication? TRASTUZUMAB (tras TOO zoo mab) is a monoclonal antibody. It is used to treat breast cancer and stomach cancer. This medicine may be used for other purposes; ask your health care provider or pharmacist if you have  questions. COMMON BRAND NAME(S): Herceptin, Janae Bridgeman, Ontruzant, Trazimera What should I tell my care team before I take this medication? They need to know if you have any of these conditions: heart disease heart failure lung or breathing disease, like asthma an unusual or  allergic reaction to trastuzumab, benzyl alcohol, or other medications, foods, dyes, or preservatives pregnant or trying to get pregnant breast-feeding How should I use this medication? This drug is given as an infusion into a vein. It is administered in a hospital or clinic by a specially trained health care professional. Talk to your pediatrician regarding the use of this medicine in children. This medicine is not approved for use in children. Overdosage: If you think you have taken too much of this medicine contact a poison control center or emergency room at once. NOTE: This medicine is only for you. Do not share this medicine with others. What if I miss a dose? It is important not to miss a dose. Call your doctor or health care professional if you are unable to keep an appointment. What may interact with this medication? This medicine may interact with the following medications: certain types of chemotherapy, such as daunorubicin, doxorubicin, epirubicin, and idarubicin This list may not describe all possible interactions. Give your health care provider a list of all the medicines, herbs, non-prescription drugs, or dietary supplements you use. Also tell them if you smoke, drink alcohol, or use illegal drugs. Some items may interact with your medicine. What should I watch for while using this medication? Visit your doctor for checks on your progress. Report any side effects. Continue your course of treatment even though you feel ill unless your doctor tells you to stop. Call your doctor or health care professional for advice if you get a fever, chills or sore throat, or other symptoms of a cold or flu. Do not treat yourself. Try to avoid being around people who are sick. You may experience fever, chills and shaking during your first infusion. These effects are usually mild and can be treated with other medicines. Report any side effects during the infusion to your health care professional. Fever  and chills usually do not happen with later infusions. Do not become pregnant while taking this medicine or for 7 months after stopping it. Women should inform their doctor if they wish to become pregnant or think they might be pregnant. Women of child-bearing potential will need to have a negative pregnancy test before starting this medicine. There is a potential for serious side effects to an unborn child. Talk to your health care professional or pharmacist for more information. Do not breast-feed an infant while taking this medicine or for 7 months after stopping it. Women must use effective birth control with this medicine. What side effects may I notice from receiving this medication? Side effects that you should report to your doctor or health care professional as soon as possible: allergic reactions like skin rash, itching or hives, swelling of the face, lips, or tongue chest pain or palpitations cough dizziness feeling faint or lightheaded, falls fever general ill feeling or flu-like symptoms signs of worsening heart failure like breathing problems; swelling in your legs and feet unusually weak or tired Side effects that usually do not require medical attention (report to your doctor or health care professional if they continue or are bothersome): bone pain changes in taste diarrhea joint pain nausea/vomiting weight loss This list may not describe all possible side effects. Call your doctor for medical  advice about side effects. You may report side effects to FDA at 1-800-FDA-1088. Where should I keep my medication? This drug is given in a hospital or clinic and will not be stored at home. NOTE: This sheet is a summary. It may not cover all possible information. If you have questions about this medicine, talk to your doctor, pharmacist, or health care provider.  2022 Elsevier/Gold Standard (2016-03-30 00:00:00)  Pertuzumab injection What is this medication? PERTUZUMAB (per TOOZ  ue mab) is a monoclonal antibody. It is used to treat breast cancer. This medicine may be used for other purposes; ask your health care provider or pharmacist if you have questions. COMMON BRAND NAME(S): PERJETA What should I tell my care team before I take this medication? They need to know if you have any of these conditions: heart disease heart failure high blood pressure history of irregular heart beat recent or ongoing radiation therapy an unusual or allergic reaction to pertuzumab, other medicines, foods, dyes, or preservatives pregnant or trying to get pregnant breast-feeding How should I use this medication? This medicine is for infusion into a vein. It is given by a health care professional in a hospital or clinic setting. Talk to your pediatrician regarding the use of this medicine in children. Special care may be needed. Overdosage: If you think you have taken too much of this medicine contact a poison control center or emergency room at once. NOTE: This medicine is only for you. Do not share this medicine with others. What if I miss a dose? It is important not to miss your dose. Call your doctor or health care professional if you are unable to keep an appointment. What may interact with this medication? Interactions are not expected. Give your health care provider a list of all the medicines, herbs, non-prescription drugs, or dietary supplements you use. Also tell them if you smoke, drink alcohol, or use illegal drugs. Some items may interact with your medicine. This list may not describe all possible interactions. Give your health care provider a list of all the medicines, herbs, non-prescription drugs, or dietary supplements you use. Also tell them if you smoke, drink alcohol, or use illegal drugs. Some items may interact with your medicine. What should I watch for while using this medication? Your condition will be monitored carefully while you are receiving this medicine.  Report any side effects. Continue your course of treatment even though you feel ill unless your doctor tells you to stop. Do not become pregnant while taking this medicine or for 7 months after stopping it. Women should inform their doctor if they wish to become pregnant or think they might be pregnant. Women of child-bearing potential will need to have a negative pregnancy test before starting this medicine. There is a potential for serious side effects to an unborn child. Talk to your health care professional or pharmacist for more information. Do not breast-feed an infant while taking this medicine or for 7 months after stopping it. Women must use effective birth control with this medicine. Call your doctor or health care professional for advice if you get a fever, chills or sore throat, or other symptoms of a cold or flu. Do not treat yourself. Try to avoid being around people who are sick. You may experience fever, chills, and headache during the infusion. Report any side effects during the infusion to your health care professional. What side effects may I notice from receiving this medication? Side effects that you should report to your doctor  or health care professional as soon as possible: breathing problems chest pain or palpitations dizziness feeling faint or lightheaded fever or chills skin rash, itching or hives sore throat swelling of the face, lips, or tongue swelling of the legs or ankles unusually weak or tired Side effects that usually do not require medical attention (report to your doctor or health care professional if they continue or are bothersome): diarrhea hair loss nausea, vomiting tiredness This list may not describe all possible side effects. Call your doctor for medical advice about side effects. You may report side effects to FDA at 1-800-FDA-1088. Where should I keep my medication? This drug is given in a hospital or clinic and will not be stored at home. NOTE:  This sheet is a summary. It may not cover all possible information. If you have questions about this medicine, talk to your doctor, pharmacist, or health care provider.  2022 Elsevier/Gold Standard (2015-04-17 00:00:00)  Docetaxel injection What is this medication? DOCETAXEL (doe se TAX el) is a chemotherapy drug. It targets fast dividing cells, like cancer cells, and causes these cells to die. This medicine is used to treat many types of cancers like breast cancer, certain stomach cancers, head and neck cancer, lung cancer, and prostate cancer. This medicine may be used for other purposes; ask your health care provider or pharmacist if you have questions. COMMON BRAND NAME(S): Docefrez, Taxotere What should I tell my care team before I take this medication? They need to know if you have any of these conditions: infection (especially a virus infection such as chickenpox, cold sores, or herpes) liver disease low blood counts, like low white cell, platelet, or red cell counts an unusual or allergic reaction to docetaxel, polysorbate 80, other chemotherapy agents, other medicines, foods, dyes, or preservatives pregnant or trying to get pregnant breast-feeding How should I use this medication? This drug is given as an infusion into a vein. It is administered in a hospital or clinic by a specially trained health care professional. Talk to your pediatrician regarding the use of this medicine in children. Special care may be needed. Overdosage: If you think you have taken too much of this medicine contact a poison control center or emergency room at once. NOTE: This medicine is only for you. Do not share this medicine with others. What if I miss a dose? It is important not to miss your dose. Call your doctor or health care professional if you are unable to keep an appointment. What may interact with this medication? Do not take this medicine with any of the following medications: live virus  vaccines This medicine may also interact with the following medications: aprepitant certain antibiotics like erythromycin or clarithromycin certain antivirals for HIV or hepatitis certain medicines for fungal infections like fluconazole, itraconazole, ketoconazole, posaconazole, or voriconazole cimetidine ciprofloxacin conivaptan cyclosporine dronedarone fluvoxamine grapefruit juice imatinib verapamil This list may not describe all possible interactions. Give your health care provider a list of all the medicines, herbs, non-prescription drugs, or dietary supplements you use. Also tell them if you smoke, drink alcohol, or use illegal drugs. Some items may interact with your medicine. What should I watch for while using this medication? Your condition will be monitored carefully while you are receiving this medicine. You will need important blood work done while you are taking this medicine. Call your doctor or health care professional for advice if you get a fever, chills or sore throat, or other symptoms of a cold or flu. Do not treat  yourself. This drug decreases your body's ability to fight infections. Try to avoid being around people who are sick. Some products may contain alcohol. Ask your health care professional if this medicine contains alcohol. Be sure to tell all health care professionals you are taking this medicine. Certain medicines, like metronidazole and disulfiram, can cause an unpleasant reaction when taken with alcohol. The reaction includes flushing, headache, nausea, vomiting, sweating, and increased thirst. The reaction can last from 30 minutes to several hours. You may get drowsy or dizzy. Do not drive, use machinery, or do anything that needs mental alertness until you know how this medicine affects you. Do not stand or sit up quickly, especially if you are an older patient. This reduces the risk of dizzy or fainting spells. Alcohol may interfere with the effect of this  medicine. Talk to your health care professional about your risk of cancer. You may be more at risk for certain types of cancer if you take this medicine. Do not become pregnant while taking this medicine or for 6 months after stopping it. Women should inform their doctor if they wish to become pregnant or think they might be pregnant. There is a potential for serious side effects to an unborn child. Talk to your health care professional or pharmacist for more information. Do not breast-feed an infant while taking this medicine or for 1 week after stopping it. Males who get this medicine must use a condom during sex with females who can get pregnant. If you get a woman pregnant, the baby could have birth defects. The baby could die before they are born. You will need to continue wearing a condom for 3 months after stopping the medicine. Tell your health care provider right away if your partner becomes pregnant while you are taking this medicine. This may interfere with the ability to father a child. You should talk to your doctor or health care professional if you are concerned about your fertility. What side effects may I notice from receiving this medication? Side effects that you should report to your doctor or health care professional as soon as possible: allergic reactions like skin rash, itching or hives, swelling of the face, lips, or tongue blurred vision breathing problems changes in vision low blood counts - This drug may decrease the number of white blood cells, red blood cells and platelets. You may be at increased risk for infections and bleeding. nausea and vomiting pain, redness or irritation at site where injected pain, tingling, numbness in the hands or feet redness, blistering, peeling, or loosening of the skin, including inside the mouth signs of decreased platelets or bleeding - bruising, pinpoint red spots on the skin, black, tarry stools, nosebleeds signs of decreased red blood  cells - unusually weak or tired, fainting spells, lightheadedness signs of infection - fever or chills, cough, sore throat, pain or difficulty passing urine swelling of the ankle, feet, hands Side effects that usually do not require medical attention (report to your doctor or health care professional if they continue or are bothersome): constipation diarrhea fingernail or toenail changes hair loss loss of appetite mouth sores muscle pain This list may not describe all possible side effects. Call your doctor for medical advice about side effects. You may report side effects to FDA at 1-800-FDA-1088. Where should I keep my medication? This drug is given in a hospital or clinic and will not be stored at home. NOTE: This sheet is a summary. It may not cover all possible information.  If you have questions about this medicine, talk to your doctor, pharmacist, or health care provider.  2022 Elsevier/Gold Standard (2020-12-02 00:00:00)  Carboplatin injection What is this medication? CARBOPLATIN (KAR boe pla tin) is a chemotherapy drug. It targets fast dividing cells, like cancer cells, and causes these cells to die. This medicine is used to treat ovarian cancer and many other cancers. This medicine may be used for other purposes; ask your health care provider or pharmacist if you have questions. COMMON BRAND NAME(S): Paraplatin What should I tell my care team before I take this medication? They need to know if you have any of these conditions: blood disorders hearing problems kidney disease recent or ongoing radiation therapy an unusual or allergic reaction to carboplatin, cisplatin, other chemotherapy, other medicines, foods, dyes, or preservatives pregnant or trying to get pregnant breast-feeding How should I use this medication? This drug is usually given as an infusion into a vein. It is administered in a hospital or clinic by a specially trained health care professional. Talk to your  pediatrician regarding the use of this medicine in children. Special care may be needed. Overdosage: If you think you have taken too much of this medicine contact a poison control center or emergency room at once. NOTE: This medicine is only for you. Do not share this medicine with others. What if I miss a dose? It is important not to miss a dose. Call your doctor or health care professional if you are unable to keep an appointment. What may interact with this medication? medicines for seizures medicines to increase blood counts like filgrastim, pegfilgrastim, sargramostim some antibiotics like amikacin, gentamicin, neomycin, streptomycin, tobramycin vaccines Talk to your doctor or health care professional before taking any of these medicines: acetaminophen aspirin ibuprofen ketoprofen naproxen This list may not describe all possible interactions. Give your health care provider a list of all the medicines, herbs, non-prescription drugs, or dietary supplements you use. Also tell them if you smoke, drink alcohol, or use illegal drugs. Some items may interact with your medicine. What should I watch for while using this medication? Your condition will be monitored carefully while you are receiving this medicine. You will need important blood work done while you are taking this medicine. This drug may make you feel generally unwell. This is not uncommon, as chemotherapy can affect healthy cells as well as cancer cells. Report any side effects. Continue your course of treatment even though you feel ill unless your doctor tells you to stop. In some cases, you may be given additional medicines to help with side effects. Follow all directions for their use. Call your doctor or health care professional for advice if you get a fever, chills or sore throat, or other symptoms of a cold or flu. Do not treat yourself. This drug decreases your body's ability to fight infections. Try to avoid being around people  who are sick. This medicine may increase your risk to bruise or bleed. Call your doctor or health care professional if you notice any unusual bleeding. Be careful brushing and flossing your teeth or using a toothpick because you may get an infection or bleed more easily. If you have any dental work done, tell your dentist you are receiving this medicine. Avoid taking products that contain aspirin, acetaminophen, ibuprofen, naproxen, or ketoprofen unless instructed by your doctor. These medicines may hide a fever. Do not become pregnant while taking this medicine. Women should inform their doctor if they wish to become pregnant or think  they might be pregnant. There is a potential for serious side effects to an unborn child. Talk to your health care professional or pharmacist for more information. Do not breast-feed an infant while taking this medicine. What side effects may I notice from receiving this medication? Side effects that you should report to your doctor or health care professional as soon as possible: allergic reactions like skin rash, itching or hives, swelling of the face, lips, or tongue signs of infection - fever or chills, cough, sore throat, pain or difficulty passing urine signs of decreased platelets or bleeding - bruising, pinpoint red spots on the skin, black, tarry stools, nosebleeds signs of decreased red blood cells - unusually weak or tired, fainting spells, lightheadedness breathing problems changes in hearing changes in vision chest pain high blood pressure low blood counts - This drug may decrease the number of white blood cells, red blood cells and platelets. You may be at increased risk for infections and bleeding. nausea and vomiting pain, swelling, redness or irritation at the injection site pain, tingling, numbness in the hands or feet problems with balance, talking, walking trouble passing urine or change in the amount of urine Side effects that usually do not  require medical attention (report to your doctor or health care professional if they continue or are bothersome): hair loss loss of appetite metallic taste in the mouth or changes in taste This list may not describe all possible side effects. Call your doctor for medical advice about side effects. You may report side effects to FDA at 1-800-FDA-1088. Where should I keep my medication? This drug is given in a hospital or clinic and will not be stored at home. NOTE: This sheet is a summary. It may not cover all possible information. If you have questions about this medicine, talk to your doctor, pharmacist, or health care provider.  2022 Elsevier/Gold Standard (2007-08-23 00:00:00)

## 2021-05-04 ENCOUNTER — Telehealth: Payer: Self-pay

## 2021-05-04 ENCOUNTER — Inpatient Hospital Stay: Payer: Commercial Managed Care - PPO

## 2021-05-04 ENCOUNTER — Other Ambulatory Visit: Payer: Self-pay

## 2021-05-04 VITALS — BP 141/84 | HR 71 | Temp 97.8°F | Resp 16

## 2021-05-04 DIAGNOSIS — C50411 Malignant neoplasm of upper-outer quadrant of right female breast: Secondary | ICD-10-CM | POA: Diagnosis not present

## 2021-05-04 DIAGNOSIS — Z171 Estrogen receptor negative status [ER-]: Secondary | ICD-10-CM

## 2021-05-04 MED ORDER — PEGFILGRASTIM-BMEZ 6 MG/0.6ML ~~LOC~~ SOSY
6.0000 mg | PREFILLED_SYRINGE | Freq: Once | SUBCUTANEOUS | Status: AC
  Administered 2021-05-04: 6 mg via SUBCUTANEOUS
  Filled 2021-05-04: qty 0.6

## 2021-05-04 NOTE — Telephone Encounter (Signed)
Patient called she wants to reschedule her telehealth visit for tomorrow morning, I tried to call the patient and wasn't able to get through, unable to leave a voicemail

## 2021-05-05 ENCOUNTER — Telehealth: Payer: Self-pay | Admitting: *Deleted

## 2021-05-05 ENCOUNTER — Telehealth: Payer: Commercial Managed Care - PPO | Admitting: Plastic Surgery

## 2021-05-05 ENCOUNTER — Inpatient Hospital Stay (HOSPITAL_BASED_OUTPATIENT_CLINIC_OR_DEPARTMENT_OTHER): Payer: Commercial Managed Care - PPO | Admitting: Adult Health

## 2021-05-05 ENCOUNTER — Other Ambulatory Visit (HOSPITAL_COMMUNITY): Payer: Commercial Managed Care - PPO

## 2021-05-05 ENCOUNTER — Encounter: Payer: Self-pay | Admitting: Adult Health

## 2021-05-05 VITALS — BP 130/80 | HR 85 | Temp 98.4°F | Wt 154.9 lb

## 2021-05-05 DIAGNOSIS — Z171 Estrogen receptor negative status [ER-]: Secondary | ICD-10-CM

## 2021-05-05 DIAGNOSIS — C50411 Malignant neoplasm of upper-outer quadrant of right female breast: Secondary | ICD-10-CM

## 2021-05-05 NOTE — Progress Notes (Signed)
[facility] Follow up:    Group, [facility] [address] [place] [address]   DIAGNOSIS:  Cancer Staging  Malignant neoplasm of upper-outer quadrant of right breast in female, estrogen receptor negative ([place]) Staging form: Breast, AJCC 8th Edition - Clinical stage from 04/08/2021: Stage IIB (cT2, cN1, cM0, G3, ER-, PR-, HER2+) - Signed by Nicholas Lose, MD on 04/15/2021 Stage prefix: Initial diagnosis Method of lymph node assessment: Axillary lymph node dissection Histologic grading system: 3 grade system   SUMMARY OF ONCOLOGIC HISTORY: Oncology History  Malignant neoplasm of upper-outer quadrant of right breast in female, estrogen receptor negative ([place])  04/08/2021 Initial Diagnosis   History of right breast eczematous change to the nipple, patient had palpable lump in the right breast her mammogram 2.1 cm spiculated mass with extensive calcifications that span 11 cm.  Ultrasound revealed 3 cm mass at 10:00 plus small satellite lesions.  Biopsy revealed grade 3 IDC ER 0%, PR 0%, HER2 3+ positive, Ki-67 30%   04/08/2021 Cancer Staging   Staging form: Breast, AJCC 8th Edition - Clinical stage from 04/08/2021: Stage IIB (cT2, cN1, cM0, G3, ER-, PR-, HER2+) - Signed by Nicholas Lose, MD on 04/15/2021 Stage prefix: Initial diagnosis Method of lymph node assessment: Axillary lymph node dissection Histologic grading system: 3 grade system    05/01/2021 -  Chemotherapy   Patient is on Treatment Plan : BREAST  Docetaxel + Carboplatin + Trastuzumab + Pertuzumab  (TCHP) q21d        CURRENT THERAPY: TCHP  INTERVAL HISTORY: Tara White 31 y.o. female returns for evaluation of pain.  Of note she did receive Taxotere carboplatin Herceptin and Perjeta on May 01, 2021 (4 days ago).  She received Neulasta yesterday.  She notes a tight and swollen neck and chest radiating into the shoulders and back.  She is concerned it could be heart related or muscle related.  She  is easily fatigued and has increased heart rate with exertion, and with certain movements.     Patient Active Problem List   Diagnosis Date Noted   Family history of breast cancer 04/21/2021   Genetic testing 04/21/2021   Malignant neoplasm of upper-outer quadrant of right breast in female, estrogen receptor negative ([place]) 04/14/2021   Hypertension in pregnancy, preeclampsia, severe, delivered/postpartum 12/16/2015   Pre-eclampsia 12/15/2015   Postpartum care following cesarean delivery (9/13) 12/11/2015   Preeclampsia, severe 12/09/2015    has No Known Allergies.  MEDICAL HISTORY: Past Medical History:  Diagnosis Date   Family history of breast cancer 04/21/2021   right breast ca 03/2021    SURGICAL HISTORY: Past Surgical History:  Procedure Laterality Date   CESAREAN SECTION N/A 12/10/2015   Procedure: CESAREAN SECTION;  Surgeon: Aloha Gell, MD;  Location: [facility];  Service: Obstetrics;  Laterality: N/A;   PORTACATH PLACEMENT Left 04/30/2021   Procedure: INSERTION PORT-A-CATH;  Surgeon: Coralie Keens, MD;  Location: [place];  Service: General;  Laterality: Left;   WISDOM TOOTH EXTRACTION      SOCIAL HISTORY: Social History   Socioeconomic History   Marital status: Married    Spouse name: Not on file   Number of children: Not on file   Years of education: Not on file   Highest education level: Not on file  Occupational History   Not on file  Tobacco Use   Smoking status: Never   Smokeless tobacco: Never  Substance and Sexual Activity   Alcohol use: Not Currently  Alcohol/week: 2.0 standard drinks    Types: 2 Standard drinks or equivalent per week   Drug use: No   Sexual activity: Not Currently    Partners: Male    Birth control/protection: Pill    Comment: Loestrin  Other Topics Concern   Not on file  Social History Narrative   Not on file   Social Determinants of Health   Financial Resource Strain: Not on file  Food  Insecurity: Not on file  Transportation Needs: Not on file  Physical Activity: Not on file  Stress: Not on file  Social Connections: Not on file  Intimate Partner Violence: Not on file    FAMILY HISTORY: Family History  Problem Relation Age of Onset   Hypothyroidism Mother    Breast cancer Maternal Grandmother 57   Skin cancer Maternal Grandfather    Heart failure Paternal Grandfather     Review of Systems  Constitutional:  Positive for fatigue. Negative for appetite change, chills, fever and unexpected weight change.  HENT:   Negative for hearing loss, lump/mass and trouble swallowing.   Eyes:  Negative for eye problems and icterus.  Respiratory:  Negative for chest tightness, cough and shortness of breath.   Cardiovascular:  Negative for chest pain, leg swelling and palpitations.  Gastrointestinal:  Negative for abdominal distention, abdominal pain, constipation, diarrhea, nausea and vomiting.  Endocrine: Negative for hot flashes.  Genitourinary:  Negative for difficulty urinating.   Musculoskeletal:  Negative for arthralgias.  Skin:  Negative for itching and rash.  Neurological:  Negative for dizziness, extremity weakness, headaches and numbness.  Hematological:  Negative for adenopathy. Does not bruise/bleed easily.  Psychiatric/Behavioral:  Negative for depression. The patient is not nervous/anxious.      PHYSICAL EXAMINATION  ECOG PERFORMANCE STATUS: 1 - Symptomatic but completely ambulatory  Vitals:   05/05/21 1600  BP: 130/80  Pulse: 85  Temp: 98.4 F (36.9 C)  SpO2: 100%    Physical Exam Constitutional:      General: She is not in acute distress.    Appearance: Normal appearance. She is not toxic-appearing.  HENT:     Head: Normocephalic and atraumatic.  Eyes:     General: No scleral icterus. Cardiovascular:     Rate and Rhythm: Normal rate and regular rhythm.     Pulses: Normal pulses.     Heart sounds: Normal heart sounds.  Pulmonary:     Effort:  Pulmonary effort is normal.     Breath sounds: Normal breath sounds.  Chest:     Comments: Right breast mass is smaller.  I did not palpate any other abnormality in either breast.  I did note that she does not have any costochondral tenderness to palpation.  I noted during breast exam that her port in her left chest wall has a little bit of bruising at the top from its insertion which occurred on February 2. Abdominal:     General: Abdomen is flat. Bowel sounds are normal. There is no distension.     Palpations: Abdomen is soft.     Tenderness: There is no abdominal tenderness.  Musculoskeletal:        General: No swelling.     Cervical back: Neck supple.  Lymphadenopathy:     Cervical: No cervical adenopathy.  Skin:    General: Skin is warm and dry.     Findings: No rash.  Neurological:     General: No focal deficit present.     Mental Status: She is alert.  Psychiatric:        Mood and Affect: Mood normal.        Behavior: Behavior normal.    LABORATORY DATA:  CBC    Component Value Date/Time   WBC 7.4 04/28/2021 0950   RBC 4.85 04/28/2021 0950   HGB 14.4 04/28/2021 0950   HGB 13.7 04/15/2021 1211   HGB 14.0 02/11/2015 1455   HCT 42.5 04/28/2021 0950   PLT 283 04/28/2021 0950   PLT 288 04/15/2021 1211   MCV 87.6 04/28/2021 0950   MCH 29.7 04/28/2021 0950   MCHC 33.9 04/28/2021 0950   RDW 12.5 04/28/2021 0950   LYMPHSABS 2.1 04/28/2021 0950   MONOABS 0.3 04/28/2021 0950   EOSABS 0.1 04/28/2021 0950   BASOSABS 0.0 04/28/2021 0950    CMP     Component Value Date/Time   NA 139 04/28/2021 0950   K 4.0 04/28/2021 0950   CL 107 04/28/2021 0950   CO2 26 04/28/2021 0950   GLUCOSE 83 04/28/2021 0950   BUN 18 04/28/2021 0950   CREATININE 0.94 04/28/2021 0950   CREATININE 0.95 04/15/2021 1211   CALCIUM 9.8 04/28/2021 0950   PROT 7.7 04/28/2021 0950   ALBUMIN 4.8 04/28/2021 0950   AST 17 04/28/2021 0950   AST 18 04/15/2021 1211   ALT 14 04/28/2021 0950   ALT 14  04/15/2021 1211   ALKPHOS 49 04/28/2021 0950   BILITOT 0.4 04/28/2021 0950   BILITOT 0.8 04/15/2021 1211   GFRNONAA >60 04/28/2021 0950   GFRNONAA >60 04/15/2021 1211   GFRAA >60 12/17/2015 0610      ASSESSMENT and THERAPY PLAN:   Malignant neoplasm of upper-outer quadrant of right breast in female, estrogen receptor negative ([place]) 04/08/2021:History of right breast eczematous change to the nipple, patient had palpable lump in the right breast her mammogram 2.1 cm spiculated mass with extensive calcifications that span 11 cm.  Ultrasound revealed 3 cm mass at 10:00 plus small satellite lesions.  Biopsy revealed grade 3 IDC ER 0%, PR 0%, HER2 3+ positive, Ki-67 30%  Recommendation: 1. Neoadjuvant chemotherapy with TCH Perjeta 6 cycles followed by Herceptin Perjeta maintenance versus Kadcyla maintenance (based on response to neoadjuvant chemo) for 1 year 2. Followed by breast conserving surgery if possible with sentinel lymph node study 3. Followed by adjuvant radiation therapy if patient had lumpectomy   Breast MRI 04/27/2021: Extensive non-mass enhancement throughout all quadrants of the breast largest confluent area 2.9 cm, right nipple enhancement consistent with DCIS, 1.9 cm right axillary lymph node, left breast negative ---------------------------------------------------------------------------------------------------------------------------------------------- Current treatment: Received [place] Perjeta start 05/01/2021  Tara White is here for follow-up of her chest wall pain.  This does not appear to be an episode of costochondritis.  I did review however to possibilities.  She could still have some soreness from the port insertion since that occurred just a few days ago.  The other possibility is that she is feeling some of the effect of the Neulasta that she received 2 days after chemo.  I shared with her that a that the Neulasta stimulates the bone marrow to produce white blood cells that can  make some areas rich in bone marrow painful.  Tara White will return next week for labs and follow-up with Dr. Lindi Adie.  For her pain, she will take Tylenol or Aleve.  It is reassuring that her tumor is already feeling smaller.   No orders of the defined types were placed in this encounter.   All questions were answered. The patient  knows to call the clinic with any problems, questions or concerns. We can certainly see the patient much sooner if necessary.  Total encounter time: 30 minutes in face-to-face visit time, chart review, lab review, care coordination, and documentation of the encounter.  Wilber Bihari, NP 05/07/21 9:04 PM Medical Oncology and Hematology [facility] [place], [place] [address] Tel. [phone]    Fax. [phone]  *Total Encounter Time as defined by the Centers for Medicare and Medicaid Services includes, in addition to the face-to-face time of a patient visit (documented in the note above) non-face-to-face time: obtaining and reviewing outside history, ordering and reviewing medications, tests or procedures, care coordination (communications with other health care professionals or caregivers) and documentation in the medical record.

## 2021-05-05 NOTE — Progress Notes (Deleted)
° °  Subjective:    Patient ID: Tara White, female    DOB: [birth year], 31 y.o.   MRN: [id number]  The patient is a 31 year old female joining me by televisit for further discussion about her breast reconstruction.  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  She does have at least 1 right axillary lymph node involved.  Cancer is estrogen and progesterone negative and HER2 positive.  She is 5 feet 9 inches tall and weighs 155 pounds her preoperative breast size is a B/C.     Review of Systems  Constitutional: Negative.   HENT: Negative.    Eyes: Negative.   Respiratory: Negative.    Cardiovascular:  Negative for palpitations.  Gastrointestinal: Negative.   Endocrine: Negative.   Genitourinary: Negative.   Musculoskeletal: Negative.   Skin: Negative.       Objective:   Physical Exam        Assessment & Plan:

## 2021-05-05 NOTE — Telephone Encounter (Signed)
Received call from pt with complaint of chest, back, and bilateral arm pain x3 days.  Pt denies recent injury or trauma.  Per MD pt needing to be seen in [facility] today for further evaluation. Appt scheduled and pt verbalized understanding of date and time.

## 2021-05-06 ENCOUNTER — Other Ambulatory Visit: Payer: Self-pay | Admitting: *Deleted

## 2021-05-06 MED ORDER — MAGIC MOUTHWASH W/LIDOCAINE: 5.0000 mL | Freq: Four times a day (QID) | ORAL | 1 refills | Status: DC | PRN

## 2021-05-06 NOTE — Progress Notes (Signed)
Received call from pt with complaint of mouth tenderness x1 day.  Pt denies sores but states she is having difficulty eating due to tenderness.  Per MD pt to be prescribed magic mouthwash.  Prescription sent to pharmacy on file, pt educated and verbalized understanding.

## 2021-05-07 ENCOUNTER — Encounter: Payer: Self-pay | Admitting: Hematology and Oncology

## 2021-05-07 ENCOUNTER — Encounter: Payer: Self-pay | Admitting: General Practice

## 2021-05-07 NOTE — Assessment & Plan Note (Signed)
04/08/2021:History of right breast eczematous change to the nipple, patient had palpable lump in the right breast her mammogram 2.1 cm spiculated mass with extensive calcifications that span 11 cm.  Ultrasound revealed 3 cm mass at 10:00 plus small satellite lesions.  Biopsy revealed grade 3 IDC ER 0%, PR 0%, HER2 3+ positive, Ki-67 30%  Recommendation: 1. Neoadjuvant chemotherapy with TCH Perjeta 6 cycles followed by Herceptin Perjeta maintenance versus Kadcyla maintenance (based on response to neoadjuvant chemo) for 1 year 2. Followed by breast conserving surgery if possible with sentinel lymph node study 3. Followed by adjuvant radiation therapy if patient had lumpectomy   Breast MRI 04/27/2021: Extensive non-mass enhancement throughout all quadrants of the breast largest confluent area 2.9 cm, right nipple enhancement consistent with DCIS, 1.9 cm right axillary lymph node, left breast negative ---------------------------------------------------------------------------------------------------------------------------------------------- Current treatment: Received [place] Perjeta start 05/01/2021  [place] is here for follow-up of her chest wall pain.  This does not appear to be an episode of costochondritis.  I did review however to possibilities.  She could still have some soreness from the port insertion since that occurred just a few days ago.  The other possibility is that she is feeling some of the effect of the Neulasta that she received 2 days after chemo.  I shared with her that a that the Neulasta stimulates the bone marrow to produce white blood cells that can make some areas rich in bone marrow painful.  Fahmida will return next week for labs and follow-up with Dr. Lindi Adie.  For her pain, she will take Tylenol or Aleve.  It is reassuring that her tumor is already feeling smaller.

## 2021-05-07 NOTE — Progress Notes (Signed)
[place] Psychosocial Distress Screening Spiritual Care  Met with Tara White by phone following [facility] to introduce [place] team/resources, reviewing distress screen per protocol.  The patient scored a 3 on the Psychosocial Distress Thermometer which indicates mild distress. Also assessed for distress and other psychosocial needs.   ONCBCN DISTRESS SCREENING 05/07/2021  Screening Type Initial Screening  Distress experienced in past week (1-10) 3  Emotional problem type Nervousness/Anxiety;Adjusting to illness  Spiritual/Religous concerns type Facing my mortality  Information Concerns Type Lack of info about diagnosis;Lack of info about treatment  Referral to support programs Yes   Tara White reports great spiritual and practical support from her church, [employer], which is meeting her needs very well. She has three children under the age of 66.  Follow up needed: No. No other needs or concerns at this time. Tara White is feeling very buoyed up, but plans to review [place] support programming packet in case opportunities appeal to her.   [place], [place], [facility] Pager [phone] Voicemail [phone]

## 2021-05-07 NOTE — Progress Notes (Signed)
Patient Care Team: [facility], [facility] as PCP - General Mauro Kaufmann, RN as Oncology Nurse Navigator Rockwell Germany, RN as Oncology Nurse Navigator Nicholas Lose, MD as Consulting Physician (Hematology and Oncology) Coralie Keens, MD as Consulting Physician (General Surgery) Eppie Gibson, MD as Attending Physician (Radiation Oncology)  DIAGNOSIS:    ICD-10-CM   1. Malignant neoplasm of upper-outer quadrant of right breast in female, estrogen receptor negative ([place])  C50.411    Z17.1       SUMMARY OF ONCOLOGIC HISTORY: Oncology History  Malignant neoplasm of upper-outer quadrant of right breast in female, estrogen receptor negative ([place])  04/08/2021 Initial Diagnosis   History of right breast eczematous change to the nipple, patient had palpable lump in the right breast her mammogram 2.1 cm spiculated mass with extensive calcifications that span 11 cm.  Ultrasound revealed 3 cm mass at 10:00 plus small satellite lesions.  Biopsy revealed grade 3 IDC ER 0%, PR 0%, HER2 3+ positive, Ki-67 30%   04/08/2021 Cancer Staging   Staging form: Breast, AJCC 8th Edition - Clinical stage from 04/08/2021: Stage IIB (cT2, cN1, cM0, G3, ER-, PR-, HER2+) - Signed by Nicholas Lose, MD on 04/15/2021 Stage prefix: Initial diagnosis Method of lymph node assessment: Axillary lymph node dissection Histologic grading system: 3 grade system    05/01/2021 -  Chemotherapy   Patient is on Treatment Plan : BREAST  Docetaxel + Carboplatin + Trastuzumab + Pertuzumab  (TCHP) q21d        CHIEF COMPLIANT: Follow-up of right breast cancer  INTERVAL HISTORY: Tara White is a 31 y.o. with above-mentioned history of breast cancer. She presents to the clinic today for follow-up.   ALLERGIES:  has No Known Allergies.  MEDICATIONS:  Current Outpatient Medications  Medication Sig Dispense Refill   dexamethasone (DECADRON) 4 MG tablet Take 2 tablets (8 mg total) by mouth 2 (two) times daily. Take 1 tablet  day before chemo and 1 tablet day after chemo with food 12 tablet 0   Homeopathic Products (FRANKINCENSE UPLIFTING) OIL Apply 2 drops topically.     HYDROcodone-acetaminophen (NORCO) 5-325 MG tablet Take 1 tablet by mouth every 6 (six) hours as needed for moderate pain. 20 tablet 0   lidocaine-prilocaine (EMLA) cream Apply to affected area once 30 g 3   LORazepam (ATIVAN) 0.5 MG tablet Take 1 tablet (0.5 mg total) by mouth at bedtime as needed (Nausea or vomiting). 30 tablet 0   magic mouthwash w/lidocaine SOLN Take 5 mLs by mouth 4 (four) times daily as needed for mouth pain. 240 mL 1   ondansetron (ZOFRAN) 8 MG tablet Take 1 tablet (8 mg total) by mouth 2 (two) times daily as needed (Nausea or vomiting). Start on the third day after chemotherapy. 30 tablet 1   prochlorperazine (COMPAZINE) 10 MG tablet Take 1 tablet (10 mg total) by mouth every 6 (six) hours as needed (Nausea or vomiting). 30 tablet 1   No current facility-administered medications for this visit.    PHYSICAL EXAMINATION: ECOG PERFORMANCE STATUS: 1 - Symptomatic but completely ambulatory  Vitals:   05/08/21 0940  BP: 128/75  Pulse: 90  Resp: 18  Temp: (!) 97.3 F (36.3 C)  SpO2: 100%   Filed Weights   05/08/21 0940  Weight: 153 lb 6.4 oz (69.6 kg)       LABORATORY DATA:  I have reviewed the data as listed CMP Latest Ref Rng & Units 04/28/2021 04/15/2021 12/17/2015  Glucose 70 - 99 mg/dL  83 119(H) 86  BUN 6 - 20 mg/dL _0 Creatinine 0.44 - 1.00 mg/dL 0.94 0.95 0.90  Sodium 135 - 145 mmol/L 139 141 138  Potassium 3.5 - 5.1 mmol/L 4.0 3.8 4.2  Chloride 98 - 111 mmol/L 107 105 108  CO2 22 - 32 mmol/L _1 Calcium 8.9 - 10.3 mg/dL 9.8 10.0 8.6(L)  Total Protein 6.5 - 8.1 g/dL 7.7 7.9 6.3(L)  Total Bilirubin 0.3 - 1.2 mg/dL 0.4 0.8 0.4  Alkaline Phos 38 - 126 U/L 49 54 91  AST 15 - 41 U/L _2 ALT 0 - 44 U/L 14 14 34    Lab Results  Component Value Date   WBC 3.1 (L) 05/08/2021   HGB 12.8  05/08/2021   HCT 36.5 05/08/2021   MCV 85.3 05/08/2021   PLT 206 05/08/2021   NEUTROABS PENDING 05/08/2021    ASSESSMENT & PLAN:  Malignant neoplasm of upper-outer quadrant of right breast in female, estrogen receptor negative ([place]) 04/08/2021:History of right breast eczematous change to the nipple, patient had palpable lump in the right breast her mammogram 2.1 cm spiculated mass with extensive calcifications that span 11 cm.  Ultrasound revealed 3 cm mass at 10:00 plus small satellite lesions.  Biopsy revealed grade 3 IDC ER 0%, PR 0%, HER2 3+ positive, Ki-67 30%   Recommendation: 1. Neoadjuvant chemotherapy with TCH Perjeta 6 cycles followed by Herceptin Perjeta maintenance versus Kadcyla maintenance (based on response to neoadjuvant chemo) for 1 year 2. Followed by mastectomy with reconstruction 3. Followed by adjuvant radiation therapy     Breast MRI 04/27/2021: Extensive non-mass enhancement throughout all quadrants of the breast largest confluent area 2.9 cm, right nipple enhancement consistent with DCIS, 1.9 cm right axillary lymph node, left breast negative ----------------------------------------------------------------------------------------------------------------------------------------------- CT CAP and bone scan: To my review there does not appear to be any evidence of metastatic disease. Current treatment: Cycle 1 day 8 neoadjuvant chemo TCH Perjeta start 05/01/2021  Chemo Toxicities: Fatigue Multiple sores: She has Magic mouthwash and it appears to be helping her. Headaches Bone pain: Improved Initially constipation but 1 episode of diarrhea  Awaiting the results of the neutrophil count to decide on the next chemo dosing. RTC in 2 weeks for cycle 2    No orders of the defined types were placed in this encounter.  The patient has a good understanding of the overall plan. she agrees with it. she will call with any problems that may develop before the next visit  here.  Total time spent: 30 mins including face to face time and time spent for planning, charting and coordination of care  Rulon Eisenmenger, MD, MPH 05/08/2021  I, Thana Ates, am acting as scribe for Dr. Nicholas Lose.  I have reviewed the above documentation for accuracy and completeness, and I agree with the above.

## 2021-05-07 NOTE — Assessment & Plan Note (Signed)
04/08/2021:History of right breast eczematous change to the nipple, patient had palpable lump in the right breast her mammogram 2.1 cm spiculated mass with extensive calcifications that span 11 cm. Ultrasound revealed 3 cm mass at 10:00 plus small satellite lesions. Biopsy revealed grade 3 IDC ER 0%, PR 0%, HER2 3+ positive, Ki-67 30%  Recommendation: 1. Neoadjuvant chemotherapy with TCH Perjeta 6 cycles followed by Herceptin Perjeta maintenance versus Kadcyla maintenance (based on response to neoadjuvant chemo) for 1 year 2. Followed by mastectomy with reconstruction 3. Followed by adjuvant radiation therapy    Breast MRI 04/27/2021: Extensive non-mass enhancement throughout all quadrants of the breast largest confluent area 2.9 cm, right nipple enhancement consistent with DCIS, 1.9 cm right axillary lymph node, left breast negative ----------------------------------------------------------------------------------------------------------------------------------------------- CT CAP and bone scan: To my review there does not appear to be any evidence of metastatic disease. Current treatment: Cycle 1 day 8 neoadjuvant chemo TCH Perjeta start 05/01/2021  Chemo Toxicities:  RTC in 2 weeks for cycle 2

## 2021-05-08 ENCOUNTER — Other Ambulatory Visit: Payer: Self-pay

## 2021-05-08 ENCOUNTER — Inpatient Hospital Stay: Payer: Commercial Managed Care - PPO

## 2021-05-08 ENCOUNTER — Encounter: Payer: Self-pay | Admitting: *Deleted

## 2021-05-08 ENCOUNTER — Inpatient Hospital Stay (HOSPITAL_BASED_OUTPATIENT_CLINIC_OR_DEPARTMENT_OTHER): Payer: Commercial Managed Care - PPO | Admitting: Hematology and Oncology

## 2021-05-08 DIAGNOSIS — C50411 Malignant neoplasm of upper-outer quadrant of right female breast: Secondary | ICD-10-CM

## 2021-05-08 DIAGNOSIS — Z171 Estrogen receptor negative status [ER-]: Secondary | ICD-10-CM | POA: Diagnosis not present

## 2021-05-08 DIAGNOSIS — Z95828 Presence of other vascular implants and grafts: Secondary | ICD-10-CM

## 2021-05-08 LAB — CBC WITH DIFFERENTIAL/PLATELET
Abs Immature Granulocytes: 0.6 10*3/uL — ABNORMAL HIGH (ref 0.00–0.07)
Band Neutrophils: 2 %
Basophils Absolute: 0 10*3/uL (ref 0.0–0.1)
Basophils Relative: 0 %
Eosinophils Absolute: 0 10*3/uL (ref 0.0–0.5)
Eosinophils Relative: 1 %
HCT: 36.5 % (ref 36.0–46.0)
Hemoglobin: 12.8 g/dL (ref 12.0–15.0)
Lymphocytes Relative: 54 %
Lymphs Abs: 1.7 10*3/uL (ref 0.7–4.0)
MCH: 29.9 pg (ref 26.0–34.0)
MCHC: 35.1 g/dL (ref 30.0–36.0)
MCV: 85.3 fL (ref 80.0–100.0)
Metamyelocytes Relative: 10 %
Monocytes Absolute: 0.4 10*3/uL (ref 0.1–1.0)
Monocytes Relative: 13 %
Myelocytes: 8 %
Neutro Abs: 0.4 10*3/uL — CL (ref 1.7–7.7)
Neutrophils Relative %: 12 %
Platelets: 206 10*3/uL (ref 150–400)
RBC: 4.28 MIL/uL (ref 3.87–5.11)
RDW: 12.1 % (ref 11.5–15.5)
Smear Review: NORMAL
WBC: 3.1 10*3/uL — ABNORMAL LOW (ref 4.0–10.5)
nRBC: 0 % (ref 0.0–0.2)

## 2021-05-08 LAB — CMP (CANCER CENTER ONLY)
ALT: 19 U/L (ref 0–44)
AST: 17 U/L (ref 15–41)
Albumin: 4.1 g/dL (ref 3.5–5.0)
Alkaline Phosphatase: 57 U/L (ref 38–126)
Anion gap: 6 (ref 5–15)
BUN: 20 mg/dL (ref 6–20)
CO2: 25 mmol/L (ref 22–32)
Calcium: 9 mg/dL (ref 8.9–10.3)
Chloride: 106 mmol/L (ref 98–111)
Creatinine: 0.84 mg/dL (ref 0.44–1.00)
GFR, Estimated: >60 mL/min (ref >60)
Glucose, Bld: 93 mg/dL (ref 70–99)
Potassium: 3.9 mmol/L (ref 3.5–5.1)
Sodium: 137 mmol/L (ref 135–145)
Total Bilirubin: 0.3 mg/dL (ref 0.3–1.2)
Total Protein: 6.9 g/dL (ref 6.5–8.1)

## 2021-05-08 LAB — GENETIC SCREENING ORDER

## 2021-05-08 MED ORDER — SODIUM CHLORIDE 0.9% FLUSH
10.0000 mL | INTRAVENOUS | Status: AC | PRN
  Administered 2021-05-08: 10 mL

## 2021-05-08 NOTE — Progress Notes (Signed)
CRITICAL VALUE STICKER  CRITICAL VALUE: ANC 0.4  RECEIVER (on-site recipient of call): Merleen Nicely, [place] NOTIFIED: 05/08/21 at 1030  MESSENGER (representative from lab): Ulice Dash  MD NOTIFIED: Nicholas Lose, MD  TIME OF NOTIFICATION: 05/08/21 at 1038  RESPONSE:  MD notified and verbalized understanding.  No orders received at this time.

## 2021-05-11 ENCOUNTER — Encounter: Payer: Self-pay | Admitting: Hematology and Oncology

## 2021-05-13 ENCOUNTER — Telehealth: Payer: Self-pay | Admitting: *Deleted

## 2021-05-13 NOTE — Telephone Encounter (Signed)
Received call from pt with complaint of sinus pressure.  Pt states she has not taken a Covid test and has caught a head cold from her child. Pt requesting advice from MD if okay to proceed with OTC medication.  RN educated pt on taking OTC mucinex DM and to take a home covid test if possible.  Pt also has a complaint of vaginal irritation during intercourse.  Pt states irritation only occurs during intercourse.  RN educated pt on OTC coconut oil to help alleviate irritation.  RN also educated pt on s/s of UTI and vaginal yeast infection and when to call the office for further evaluation.  Pt verbalized understanding and appreciative of advice.

## 2021-05-16 ENCOUNTER — Other Ambulatory Visit: Payer: Self-pay

## 2021-05-16 ENCOUNTER — Emergency Department (INDEPENDENT_AMBULATORY_CARE_PROVIDER_SITE_OTHER)
Admission: RE | Admit: 2021-05-16 | Discharge: 2021-05-16 | Disposition: A | Discharge status: Home / Self Care | Payer: Commercial Managed Care - PPO | Source: Ambulatory Visit | Attending: Family Medicine | Admitting: Family Medicine

## 2021-05-16 VITALS — BP 135/85 | HR 79 | Temp 97.9°F | Resp 20 | Ht 69.0 in | Wt 154.0 lb

## 2021-05-16 DIAGNOSIS — R300 Dysuria: Secondary | ICD-10-CM | POA: Diagnosis not present

## 2021-05-16 LAB — POCT URINALYSIS DIP (MANUAL ENTRY)
Bilirubin, UA: NEGATIVE
Glucose, UA: NEGATIVE mg/dL
Ketones, POC UA: NEGATIVE mg/dL
Nitrite, UA: NEGATIVE
Protein Ur, POC: NEGATIVE mg/dL
Spec Grav, UA: 1.01 (ref 1.010–1.025)
Urobilinogen, UA: 0.2 E.U./dL
pH, UA: 6.5 (ref 5.0–8.0)

## 2021-05-16 MED ORDER — NITROFURANTOIN MONOHYD MACRO 100 MG PO CAPS: 100.0000 mg | ORAL_CAPSULE | Freq: Two times a day (BID) | ORAL | 0 refills | Status: DC

## 2021-05-16 NOTE — ED Provider Notes (Signed)
[facility]    CSN: [id number] Arrival date & time: 05/16/21  0949      History   Chief Complaint Chief Complaint  Patient presents with   Urinary Frequency    Urinary, frequency, urinary urgency and vaginal irritation. X2 days    HPI Tara White is a 31 y.o. female.   HPI  Patient is here for urinary tract infection.  She has been having dysuria and frequency for about 2 days.  She has also had some vaginal irritation, with sexual relations.  She has discussed this with her usual provider and has taken Diflucan.  She is worried about a urinary tract infection.  She has no flank pain.  No fever or chills.  No nausea or vomiting. She is undergoing chemotherapy for breast cancer.  She states that is going well  Past Medical History:  Diagnosis Date   Family history of breast cancer 04/21/2021   right breast ca 03/2021    Patient Active Problem List   Diagnosis Date Noted   Family history of breast cancer 04/21/2021   Genetic testing 04/21/2021   Malignant neoplasm of upper-outer quadrant of right breast in female, estrogen receptor negative ([place]) 04/14/2021   Hypertension in pregnancy, preeclampsia, severe, delivered/postpartum 12/16/2015   Pre-eclampsia 12/15/2015   Postpartum care following cesarean delivery (9/13) 12/11/2015   Preeclampsia, severe 12/09/2015    Past Surgical History:  Procedure Laterality Date   CESAREAN SECTION N/A 12/10/2015   Procedure: CESAREAN SECTION;  Surgeon: Aloha Gell, MD;  Location: [facility];  Service: Obstetrics;  Laterality: N/A;   PORTACATH PLACEMENT Left 04/30/2021   Procedure: INSERTION PORT-A-CATH;  Surgeon: Coralie Keens, MD;  Location: [facility];  Service: General;  Laterality: Left;   WISDOM TOOTH EXTRACTION      OB History     Gravida  1   Para  1   Term  0   Preterm  1   AB  0   Living  1      SAB  0   IAB  0   Ectopic  0   Multiple  0   Live Births  1             Home Medications    Prior to Admission medications   Medication Sig Start Date End Date Taking? Authorizing Provider  dexamethasone (DECADRON) 4 MG tablet Take 2 tablets (8 mg total) by mouth 2 (two) times daily. Take 1 tablet day before chemo and 1 tablet day after chemo with food 04/15/21  Yes Nicholas Lose, MD  Homeopathic Products (FRANKINCENSE UPLIFTING) OIL Apply 2 drops topically.   Yes [provider]  HYDROcodone-acetaminophen (NORCO) 5-325 MG tablet Take 1 tablet by mouth every 6 (six) hours as needed for moderate pain. 04/30/21  Yes Coralie Keens, MD  lidocaine-prilocaine (EMLA) cream Apply to affected area once 04/15/21  Yes Nicholas Lose, MD  LORazepam (ATIVAN) 0.5 MG tablet Take 1 tablet (0.5 mg total) by mouth at bedtime as needed (Nausea or vomiting). 04/15/21  Yes Nicholas Lose, MD  magic mouthwash w/lidocaine SOLN Take 5 mLs by mouth 4 (four) times daily as needed for mouth pain. 05/06/21  Yes Nicholas Lose, MD  nitrofurantoin, macrocrystal-monohydrate, (MACROBID) 100 MG capsule Take 1 capsule (100 mg total) by mouth 2 (two) times daily. 05/16/21  Yes Raylene Everts, MD  ondansetron (ZOFRAN) 8 MG tablet Take 1 tablet (8 mg total) by mouth 2 (two) times daily as needed (Nausea or vomiting).  Start on the third day after chemotherapy. 04/15/21  Yes Nicholas Lose, MD  prochlorperazine (COMPAZINE) 10 MG tablet Take 1 tablet (10 mg total) by mouth every 6 (six) hours as needed (Nausea or vomiting). 04/15/21  Yes Nicholas Lose, MD    Family History Family History  Problem Relation Age of Onset   Hypothyroidism Mother    Breast cancer Maternal Grandmother 37   Skin cancer Maternal Grandfather    Heart failure Paternal Grandfather     Social History Social History   Tobacco Use   Smoking status: Never   Smokeless tobacco: Never  Substance Use Topics   Alcohol use: Not Currently    Alcohol/week: 2.0 standard drinks    Types: 2 Standard drinks or equivalent  per week   Drug use: No     Allergies   Patient has no known allergies.   Review of Systems Review of Systems See HPI  Physical Exam Triage Vital Signs ED Triage Vitals  Enc Vitals Group     BP 05/16/21 1003 135/85     Pulse Rate 05/16/21 1003 79     Resp 05/16/21 1003 20     Temp 05/16/21 1003 97.9 F (36.6 C)     Temp Source 05/16/21 1003 Oral     SpO2 05/16/21 1003 100 %     Weight 05/16/21 1000 154 lb (69.9 kg)     Height 05/16/21 1000 5\' 9"  (1.753 m)     Head Circumference --      Peak Flow --      Pain Score 05/16/21 0959 5     Pain Loc --      Pain Edu? --      Excl. in [place]? --    No data found.  Updated Vital Signs BP 135/85 (BP Location: Right Arm)    Pulse 79    Temp 97.9 F (36.6 C) (Oral)    Resp 20    Ht 5\' 9"  (1.753 m)    Wt 69.9 kg    LMP 05/06/2021    SpO2 100%    BMI 22.74 kg/m      Physical Exam Constitutional:      General: She is not in acute distress.    Appearance: She is well-developed.  HENT:     Head: Normocephalic and atraumatic.     Mouth/Throat:     Comments: Mask is in place Eyes:     Conjunctiva/sclera: Conjunctivae normal.     Pupils: Pupils are equal, round, and reactive to light.  Cardiovascular:     Rate and Rhythm: Normal rate.  Pulmonary:     Effort: Pulmonary effort is normal. No respiratory distress.  Abdominal:     General: There is no distension.     Palpations: Abdomen is soft.     Tenderness: There is no right CVA tenderness or left CVA tenderness.  Musculoskeletal:        General: Normal range of motion.     Cervical back: Normal range of motion.  Skin:    General: Skin is warm and dry.  Neurological:     Mental Status: She is alert.     UC Treatments / Results  Labs (all labs ordered are listed, but only abnormal results are displayed) Labs Reviewed  POCT URINALYSIS DIP (MANUAL ENTRY) - Abnormal; Notable for the following components:      Result Value   Blood, UA small (*)    Leukocytes, UA Small  (1+) (*)    All  other components within normal limits  URINE CULTURE    EKG   Radiology No results found.  Procedures Procedures (including critical care time)  Medications Ordered in UC Medications - No data to display  Initial Impression / Assessment and Plan / UC Course  I have reviewed the triage vital signs and the nursing notes.  Pertinent labs & imaging results that were available during my care of the patient were reviewed by me and considered in my medical decision making (see chart for details).     Urinalysis is suggestive of UTI with leukocytes and red blood cells.  Will send for culture and cover her with antibiotics.  Follow-up with her primary care Final Clinical Impressions(s) / UC Diagnoses   Final diagnoses:  Dysuria     Discharge Instructions      Drink lots of water Take the nitrofurantoin antibiotic 2 times a day for 5 days.  Take 2 doses today Your urine has been sent for culture.  If any change in antibiotics is indicated you will be notified   ED Prescriptions     Medication Sig Dispense Auth. Provider   nitrofurantoin, macrocrystal-monohydrate, (MACROBID) 100 MG capsule Take 1 capsule (100 mg total) by mouth 2 (two) times daily. 10 capsule Raylene Everts, MD      PDMP not reviewed this encounter.   Raylene Everts, MD 05/16/21 [phone]

## 2021-05-16 NOTE — Discharge Instructions (Signed)
Drink lots of water Take the nitrofurantoin antibiotic 2 times a day for 5 days.  Take 2 doses today Your urine has been sent for culture.  If any change in antibiotics is indicated you will be notified

## 2021-05-16 NOTE — ED Triage Notes (Signed)
Pt states that she has some urinary urgency, urinary frequency and vaginal irritation. X2 days

## 2021-05-18 ENCOUNTER — Telehealth: Payer: Self-pay

## 2021-05-18 LAB — URINE CULTURE
MICRO NUMBER:: 13029110
SPECIMEN QUALITY:: ADEQUATE

## 2021-05-18 NOTE — Telephone Encounter (Signed)
Pt presented to ED this weekend with UTI sx. Pt was started on abx per ED; pt reports she is still experiencing vaginal discomfort and states she may have BV or a yeast infection as these are common for her. Advised pt to call GYN for consult on this and call us if she can not be seen. Pt verbalized thanks and understanding.

## 2021-05-21 ENCOUNTER — Encounter: Payer: Self-pay | Admitting: Hematology and Oncology

## 2021-05-21 MED FILL — Fosaprepitant Dimeglumine For IV Infusion 150 MG (Base Eq): INTRAVENOUS | Qty: 5 | Status: AC

## 2021-05-21 MED FILL — Dexamethasone Sodium Phosphate Inj 100 MG/10ML: INTRAMUSCULAR | Qty: 1 | Status: AC

## 2021-05-21 NOTE — Progress Notes (Signed)
Patient Care Team: Group, [facility] as PCP - General Mauro Kaufmann, RN as Oncology Nurse Navigator Rockwell Germany, RN as Oncology Nurse Navigator Nicholas Lose, MD as Consulting Physician (Hematology and Oncology) Coralie Keens, MD as Consulting Physician (General Surgery) Eppie Gibson, MD as Attending Physician (Radiation Oncology)  DIAGNOSIS:    ICD-10-CM   1. Malignant neoplasm of upper-outer quadrant of right breast in female, estrogen receptor negative ([place])  C50.411    Z17.1       SUMMARY OF ONCOLOGIC HISTORY: Oncology History  Malignant neoplasm of upper-outer quadrant of right breast in female, estrogen receptor negative ([place])  04/08/2021 Initial Diagnosis   History of right breast eczematous change to the nipple, patient had palpable lump in the right breast her mammogram 2.1 cm spiculated mass with extensive calcifications that span 11 cm.  Ultrasound revealed 3 cm mass at 10:00 plus small satellite lesions.  Biopsy revealed grade 3 IDC ER 0%, PR 0%, HER2 3+ positive, Ki-67 30%   04/08/2021 Cancer Staging   Staging form: Breast, AJCC 8th Edition - Clinical stage from 04/08/2021: Stage IIB (cT2, cN1, cM0, G3, ER-, PR-, HER2+) - Signed by Nicholas Lose, MD on 04/15/2021 Stage prefix: Initial diagnosis Method of lymph node assessment: Axillary lymph node dissection Histologic grading system: 3 grade system    05/01/2021 -  Chemotherapy   Patient is on Treatment Plan : BREAST  Docetaxel + Carboplatin + Trastuzumab + Pertuzumab  (TCHP) q21d        CHIEF COMPLIANT: Follow-up of right breast cancer  INTERVAL HISTORY: Tara White is a 31 y.o. with above-mentioned history of right breast cancer. She presents to the clinic today for follow-up.  She developed urinary tract infection and had to go to urgent care and was treated with antibiotics.  She felt better after that.  Her only other concern is vaginal dryness.  Denies any major side effects over the past  week.  ALLERGIES:  has No Known Allergies.  MEDICATIONS:  Current Outpatient Medications  Medication Sig Dispense Refill   dexamethasone (DECADRON) 4 MG tablet Take 2 tablets (8 mg total) by mouth 2 (two) times daily. Take 1 tablet day before chemo and 1 tablet day after chemo with food 12 tablet 0   Homeopathic Products (FRANKINCENSE UPLIFTING) OIL Apply 2 drops topically.     HYDROcodone-acetaminophen (NORCO) 5-325 MG tablet Take 1 tablet by mouth every 6 (six) hours as needed for moderate pain. 20 tablet 0   lidocaine-prilocaine (EMLA) cream Apply to affected area once 30 g 3   LORazepam (ATIVAN) 0.5 MG tablet Take 1 tablet (0.5 mg total) by mouth at bedtime as needed (Nausea or vomiting). 30 tablet 0   magic mouthwash w/lidocaine SOLN Take 5 mLs by mouth 4 (four) times daily as needed for mouth pain. 240 mL 1   nitrofurantoin, macrocrystal-monohydrate, (MACROBID) 100 MG capsule Take 1 capsule (100 mg total) by mouth 2 (two) times daily. 10 capsule 0   ondansetron (ZOFRAN) 8 MG tablet Take 1 tablet (8 mg total) by mouth 2 (two) times daily as needed (Nausea or vomiting). Start on the third day after chemotherapy. 30 tablet 1   prochlorperazine (COMPAZINE) 10 MG tablet Take 1 tablet (10 mg total) by mouth every 6 (six) hours as needed (Nausea or vomiting). 30 tablet 1   No current facility-administered medications for this visit.    PHYSICAL EXAMINATION: ECOG PERFORMANCE STATUS: 1 - Symptomatic but completely ambulatory  Vitals:   05/22/21 1044  BP: [place] Kitchen)  146/79  Pulse: 75  Resp: 17  Temp: (!) 97.5 F (36.4 C)  SpO2: 100%   Filed Weights   05/22/21 1044  Weight: 153 lb 3.2 oz (69.5 kg)      LABORATORY DATA:  I have reviewed the data as listed CMP Latest Ref Rng & Units 05/22/2021 05/08/2021 04/28/2021  Glucose 70 - 99 mg/dL 84 93 83  BUN 6 - 20 mg/dL 17 20 18   Creatinine 0.44 - 1.00 mg/dL 0.88 0.84 0.94  Sodium 135 - 145 mmol/L 139 137 139  Potassium 3.5 - 5.1 mmol/L 3.3(L)  3.9 4.0  Chloride 98 - 111 mmol/L 105 106 107  CO2 22 - 32 mmol/L 28 25 26   Calcium 8.9 - 10.3 mg/dL 9.3 9.0 9.8  Total Protein 6.5 - 8.1 g/dL 6.9 6.9 7.7  Total Bilirubin 0.3 - 1.2 mg/dL 0.4 0.3 0.4  Alkaline Phos 38 - 126 U/L 82 57 49  AST 15 - 41 U/L 18 17 17   ALT 0 - 44 U/L 32 19 14    Lab Results  Component Value Date   WBC 10.5 05/22/2021   HGB 11.5 (L) 05/22/2021   HCT 34.2 (L) 05/22/2021   MCV 87.9 05/22/2021   PLT 282 05/22/2021   NEUTROABS 6.5 05/22/2021    ASSESSMENT & PLAN:  Malignant neoplasm of upper-outer quadrant of right breast in female, estrogen receptor negative ([place]) 04/08/2021:History of right breast eczematous change to the nipple, patient had palpable lump in the right breast her mammogram 2.1 cm spiculated mass with extensive calcifications that span 11 cm.  Ultrasound revealed 3 cm mass at 10:00 plus small satellite lesions.  Biopsy revealed grade 3 IDC ER 0%, PR 0%, HER2 3+ positive, Ki-67 30%   Recommendation: 1. Neoadjuvant chemotherapy with TCH Perjeta 6 cycles followed by Herceptin Perjeta maintenance versus Kadcyla maintenance (based on response to neoadjuvant chemo) for 1 year 2. Followed by mastectomy with reconstruction 3. Followed by adjuvant radiation therapy     Breast MRI 04/27/2021: Extensive non-mass enhancement throughout all quadrants of the breast largest confluent area 2.9 cm, right nipple enhancement consistent with DCIS, 1.9 cm right axillary lymph node, left breast negative ----------------------------------------------------------------------------------------------------------------------------------------------- CT CAP and bone scan: To my review there does not appear to be any evidence of metastatic disease. Current treatment: Cycle 2 neoadjuvant chemo TCH Perjeta start 05/01/2021   Chemo Toxicities: Fatigue Multiple sores: Resolved Initially constipation but 1 episode of diarrhea UTI: Treated with antibiotics. Vaginal dryness:  Encouraged her to use Replens.     RTC in 3 weeks for cycle 3    No orders of the defined types were placed in this encounter.  The patient has a good understanding of the overall plan. she agrees with it. she will call with any problems that may develop before the next visit here.  Total time spent: 30 mins including face to face time and time spent for planning, charting and coordination of care  Rulon Eisenmenger, MD, MPH 05/22/2021  I, Thana Ates, am acting as scribe for Dr. Nicholas Lose.  I have reviewed the above documentation for accuracy and completeness, and I agree with the above.

## 2021-05-21 NOTE — Assessment & Plan Note (Signed)
04/08/2021:History of right breast eczematous change to the nipple, patient had palpable lump in the right breast her mammogram 2.1 cm spiculated mass with extensive calcifications that span 11 cm. Ultrasound revealed 3 cm mass at 10:00 plus small satellite lesions. Biopsy revealed grade 3 IDC ER 0%, PR 0%, HER2 3+ positive, Ki-67 30%  Recommendation: 1. Neoadjuvant chemotherapy with TCH Perjeta 6 cycles followed by Herceptin Perjeta maintenance versus Kadcyla maintenance (based on response to neoadjuvant chemo) for 1 year 2. Followed bymastectomy with reconstruction 3. Followed by adjuvant radiation therapy  Breast MRI 04/27/2021: Extensive non-mass enhancement throughout all quadrants of the breast largest confluent area 2.9 cm, right nipple enhancement consistent with DCIS, 1.9 cm right axillary lymph node, left breast negative ----------------------------------------------------------------------------------------------------------------------------------------------- CT CAP and bone scan:To my review there does not appear to be any evidence of metastatic disease. Current treatment: Cycle 2 neoadjuvant chemo TCH Perjeta start 05/01/2021  Chemo Toxicities: 1. Fatigue 2. Multiple sores: She has Magic mouthwash and it appears to be helping her. 3. Headaches 4. Bone pain: Improved 5. Initially constipation but 1 episode of diarrhea  Awaiting the results of the neutrophil count to decide on the next chemo dosing. RTC in 3 weeks for cycle 3

## 2021-05-22 ENCOUNTER — Inpatient Hospital Stay (HOSPITAL_BASED_OUTPATIENT_CLINIC_OR_DEPARTMENT_OTHER): Payer: Commercial Managed Care - PPO | Admitting: Hematology and Oncology

## 2021-05-22 ENCOUNTER — Other Ambulatory Visit: Payer: Self-pay

## 2021-05-22 ENCOUNTER — Inpatient Hospital Stay: Payer: Commercial Managed Care - PPO

## 2021-05-22 DIAGNOSIS — Z171 Estrogen receptor negative status [ER-]: Secondary | ICD-10-CM

## 2021-05-22 DIAGNOSIS — C50411 Malignant neoplasm of upper-outer quadrant of right female breast: Secondary | ICD-10-CM

## 2021-05-22 LAB — CBC WITH DIFFERENTIAL/PLATELET
Abs Immature Granulocytes: 0.05 10*3/uL (ref 0.00–0.07)
Basophils Absolute: 0 10*3/uL (ref 0.0–0.1)
Basophils Relative: 0 %
Eosinophils Absolute: 0 10*3/uL (ref 0.0–0.5)
Eosinophils Relative: 0 %
HCT: 34.2 % — ABNORMAL LOW (ref 36.0–46.0)
Hemoglobin: 11.5 g/dL — ABNORMAL LOW (ref 12.0–15.0)
Immature Granulocytes: 1 %
Lymphocytes Relative: 30 %
Lymphs Abs: 3.2 10*3/uL (ref 0.7–4.0)
MCH: 29.6 pg (ref 26.0–34.0)
MCHC: 33.6 g/dL (ref 30.0–36.0)
MCV: 87.9 fL (ref 80.0–100.0)
Monocytes Absolute: 0.7 10*3/uL (ref 0.1–1.0)
Monocytes Relative: 7 %
Neutro Abs: 6.5 10*3/uL (ref 1.7–7.7)
Neutrophils Relative %: 62 %
Platelets: 282 10*3/uL (ref 150–400)
RBC: 3.89 MIL/uL (ref 3.87–5.11)
RDW: 13.1 % (ref 11.5–15.5)
WBC: 10.5 10*3/uL (ref 4.0–10.5)
nRBC: 0 % (ref 0.0–0.2)

## 2021-05-22 LAB — COMPREHENSIVE METABOLIC PANEL
ALT: 32 U/L (ref 0–44)
AST: 18 U/L (ref 15–41)
Albumin: 4.1 g/dL (ref 3.5–5.0)
Alkaline Phosphatase: 82 U/L (ref 38–126)
Anion gap: 6 (ref 5–15)
BUN: 17 mg/dL (ref 6–20)
CO2: 28 mmol/L (ref 22–32)
Calcium: 9.3 mg/dL (ref 8.9–10.3)
Chloride: 105 mmol/L (ref 98–111)
Creatinine, Ser: 0.88 mg/dL (ref 0.44–1.00)
GFR, Estimated: >60 mL/min (ref >60)
Glucose, Bld: 84 mg/dL (ref 70–99)
Potassium: 3.3 mmol/L — ABNORMAL LOW (ref 3.5–5.1)
Sodium: 139 mmol/L (ref 135–145)
Total Bilirubin: 0.4 mg/dL (ref 0.3–1.2)
Total Protein: 6.9 g/dL (ref 6.5–8.1)

## 2021-05-22 LAB — PREGNANCY, URINE: Preg Test, Ur: NEGATIVE

## 2021-05-22 MED ORDER — TRASTUZUMAB-ANNS CHEMO 150 MG IV SOLR
6.0000 mg/kg | Freq: Once | INTRAVENOUS | Status: AC
  Administered 2021-05-22: 420 mg via INTRAVENOUS
  Filled 2021-05-22: qty 20

## 2021-05-22 MED ORDER — ACETAMINOPHEN 325 MG PO TABS
650.0000 mg | ORAL_TABLET | Freq: Once | ORAL | Status: AC
  Administered 2021-05-22: 650 mg via ORAL
  Filled 2021-05-22: qty 2

## 2021-05-22 MED ORDER — SODIUM CHLORIDE 0.9% FLUSH
10.0000 mL | INTRAVENOUS | Status: DC | PRN
  Administered 2021-05-22: 10 mL via INTRAVENOUS

## 2021-05-22 MED ORDER — SODIUM CHLORIDE 0.9 % IV SOLN
420.0000 mg | Freq: Once | INTRAVENOUS | Status: AC
  Administered 2021-05-22: 420 mg via INTRAVENOUS
  Filled 2021-05-22: qty 14

## 2021-05-22 MED ORDER — SODIUM CHLORIDE 0.9% FLUSH
3.0000 mL | INTRAVENOUS | Status: DC | PRN
  Administered 2021-05-22: 10 mL

## 2021-05-22 MED ORDER — SODIUM CHLORIDE 0.9 % IV SOLN
150.0000 mg | Freq: Once | INTRAVENOUS | Status: AC
  Administered 2021-05-22: 150 mg via INTRAVENOUS
  Filled 2021-05-22: qty 150

## 2021-05-22 MED ORDER — SODIUM CHLORIDE 0.9 % IV SOLN
750.0000 mg | Freq: Once | INTRAVENOUS | Status: AC
  Administered 2021-05-22: 750 mg via INTRAVENOUS
  Filled 2021-05-22: qty 75

## 2021-05-22 MED ORDER — HEPARIN SOD (PORK) LOCK FLUSH 100 UNIT/ML IV SOLN
500.0000 [IU] | Freq: Once | INTRAVENOUS | Status: AC
  Administered 2021-05-22: 500 [IU] via INTRAVENOUS

## 2021-05-22 MED ORDER — PALONOSETRON HCL INJECTION 0.25 MG/5ML
0.2500 mg | Freq: Once | INTRAVENOUS | Status: AC
  Administered 2021-05-22: 0.25 mg via INTRAVENOUS
  Filled 2021-05-22: qty 5

## 2021-05-22 MED ORDER — SODIUM CHLORIDE 0.9 % IV SOLN
10.0000 mg | Freq: Once | INTRAVENOUS | Status: AC
  Administered 2021-05-22: 10 mg via INTRAVENOUS
  Filled 2021-05-22: qty 10

## 2021-05-22 MED ORDER — SODIUM CHLORIDE 0.9 % IV SOLN
75.0000 mg/m2 | Freq: Once | INTRAVENOUS | Status: AC
  Administered 2021-05-22: 140 mg via INTRAVENOUS
  Filled 2021-05-22: qty 14

## 2021-05-22 MED ORDER — DIPHENHYDRAMINE HCL 25 MG PO CAPS
50.0000 mg | ORAL_CAPSULE | Freq: Once | ORAL | Status: AC
  Administered 2021-05-22: 50 mg via ORAL
  Filled 2021-05-22: qty 2

## 2021-05-22 MED ORDER — SODIUM CHLORIDE 0.9 % IV SOLN: Freq: Once | INTRAVENOUS | Status: AC

## 2021-05-22 MED ORDER — HEPARIN SOD (PORK) LOCK FLUSH 100 UNIT/ML IV SOLN
250.0000 [IU] | Freq: Once | INTRAVENOUS | Status: AC | PRN
  Administered 2021-05-22: 250 [IU]

## 2021-05-22 MED ORDER — SODIUM CHLORIDE 0.9% FLUSH
10.0000 mL | Freq: Once | INTRAVENOUS | Status: AC
  Administered 2021-05-22: 10 mL via INTRAVENOUS

## 2021-05-22 NOTE — Patient Instructions (Signed)
[facility]   Discharge Instructions: Thank you for choosing [facility] to provide your oncology and hematology care.   If you have a lab appointment with the [facility], please go directly to the [facility] and check in at the registration area.   Wear comfortable clothing and clothing appropriate for easy access to any Portacath or PICC line.   We strive to give you quality time with your provider. You may need to reschedule your appointment if you arrive late (15 or more minutes).  Arriving late affects you and other patients whose appointments are after yours.  Also, if you miss three or more appointments without notifying the office, you may be dismissed from the clinic at the providers discretion.      For prescription refill requests, have your pharmacy contact our office and allow 72 hours for refills to be completed.    Today you received the following chemotherapy and/or immunotherapy agents: Trastuzumab (Herceptin), Pertuzumab (Perjeta), Docetaxel (Taxotere), and Carboplatin      To help prevent nausea and vomiting after your treatment, we encourage you to take your nausea medication as directed.  BELOW ARE SYMPTOMS THAT SHOULD BE REPORTED IMMEDIATELY: *FEVER GREATER THAN 100.4 F (38 C) OR HIGHER *CHILLS OR SWEATING *NAUSEA AND VOMITING THAT IS NOT CONTROLLED WITH YOUR NAUSEA MEDICATION *UNUSUAL SHORTNESS OF BREATH *UNUSUAL BRUISING OR BLEEDING *URINARY PROBLEMS (pain or burning when urinating, or frequent urination) *BOWEL PROBLEMS (unusual diarrhea, constipation, pain near the anus) TENDERNESS IN MOUTH AND THROAT WITH OR WITHOUT PRESENCE OF ULCERS (sore throat, sores in mouth, or a toothache) UNUSUAL RASH, SWELLING OR PAIN  UNUSUAL VAGINAL DISCHARGE OR ITCHING   Items with * indicate a potential emergency and should be followed up as soon as possible or go to the Emergency Department if any problems should occur.  Please  show the CHEMOTHERAPY ALERT CARD or IMMUNOTHERAPY ALERT CARD at check-in to the Emergency Department and triage nurse.  Should you have questions after your visit or need to cancel or reschedule your appointment, please contact [facility]  Dept: [phone]  and follow the prompts.  Office hours are 8:00 a.m. to 4:30 p.m. Monday - Friday. Please note that voicemails left after 4:00 p.m. may not be returned until the following business day.  We are closed weekends and major holidays. You have access to a nurse at all times for urgent questions. Please call the main number to the clinic Dept: [phone] and follow the prompts.   For any non-urgent questions, you may also contact your provider using MyChart. We now offer e-Visits for anyone 32 and older to request care online for non-urgent symptoms. For details visit [email or web address].   Also download the MyChart app! Go to the app store, search "MyChart", open the app, select [facility], and log in with your MyChart username and password.  Due to Covid, a mask is required upon entering the hospital/clinic. If you do not have a mask, one will be given to you upon arrival. For doctor visits, patients may have 1 support person aged 32 or older with them. For treatment visits, patients cannot have anyone with them due to current Covid guidelines and our immunocompromised population.

## 2021-05-25 ENCOUNTER — Inpatient Hospital Stay: Payer: Commercial Managed Care - PPO

## 2021-05-25 ENCOUNTER — Telehealth: Payer: Self-pay | Admitting: Genetic Counselor

## 2021-05-25 ENCOUNTER — Other Ambulatory Visit: Payer: Self-pay | Admitting: *Deleted

## 2021-05-25 ENCOUNTER — Ambulatory Visit: Payer: Self-pay | Admitting: Genetic Counselor

## 2021-05-25 ENCOUNTER — Other Ambulatory Visit: Payer: Self-pay

## 2021-05-25 VITALS — BP 134/96 | HR 82 | Temp 97.6°F | Resp 17

## 2021-05-25 DIAGNOSIS — C50411 Malignant neoplasm of upper-outer quadrant of right female breast: Secondary | ICD-10-CM | POA: Diagnosis not present

## 2021-05-25 DIAGNOSIS — Z171 Estrogen receptor negative status [ER-]: Secondary | ICD-10-CM

## 2021-05-25 DIAGNOSIS — Z803 Family history of malignant neoplasm of breast: Secondary | ICD-10-CM

## 2021-05-25 DIAGNOSIS — Z1379 Encounter for other screening for genetic and chromosomal anomalies: Secondary | ICD-10-CM

## 2021-05-25 LAB — URINALYSIS, COMPLETE (UACMP) WITH MICROSCOPIC
Bilirubin Urine: NEGATIVE
Glucose, UA: NEGATIVE mg/dL
Ketones, ur: NEGATIVE mg/dL
Nitrite: NEGATIVE
Protein, ur: NEGATIVE mg/dL
Specific Gravity, Urine: 1.004 — ABNORMAL LOW (ref 1.005–1.030)
WBC, UA: >50 WBC/hpf — ABNORMAL HIGH (ref 0–5)
pH: 5 (ref 5.0–8.0)

## 2021-05-25 MED ORDER — LEVOFLOXACIN 500 MG PO TABS: 500.0000 mg | ORAL_TABLET | Freq: Every day | ORAL | 0 refills | Status: DC

## 2021-05-25 MED ORDER — FLUCONAZOLE 150 MG PO TABS: 150.0000 mg | ORAL_TABLET | Freq: Every day | ORAL | 0 refills | Status: DC

## 2021-05-25 MED ORDER — PEGFILGRASTIM-BMEZ 6 MG/0.6ML ~~LOC~~ SOSY
6.0000 mg | PREFILLED_SYRINGE | Freq: Once | SUBCUTANEOUS | Status: AC
  Administered 2021-05-25: 6 mg via SUBCUTANEOUS
  Filled 2021-05-25: qty 0.6

## 2021-05-25 NOTE — Telephone Encounter (Signed)
Revealed negative genetic testing.  Discussed that we do not know why she has breast cancer or why there is cancer in the family. It could be sporadic/famillial, due to a change in a gene that she did not inherit, due to a different gene that we are not testing, or maybe our current technology may not be able to pick something up.  It will be important for her to keep in contact with genetics to keep up with whether additional testing may be needed.     

## 2021-05-25 NOTE — Progress Notes (Signed)
Received call from pt stating whenever she takes antibiotics, she develops a yeast infection.  RN reviewed with MD and verbal orders received for pt to be prescribed Diflucan 150 mg p.o.  prescription sent to pharmacy on file. Pt educated on antibiotic, OTC pyridium, and diflucan and verbalized understanding.

## 2021-05-25 NOTE — Progress Notes (Signed)
Received call from pt with complaint of burning with urination and bladder discomfort x2 days.  Pt requesting UA to be collected today when she comes in for her injection.  Verbal orders received from MD for UA, appt scheduled.

## 2021-05-25 NOTE — Progress Notes (Addendum)
HPI:   Ms. Denunzio was previously seen in the [facility] clinic due to a personal and family history of breast cancer and concerns regarding a hereditary predisposition to cancer. Please refer to our prior cancer genetics clinic note for more information regarding our discussion, assessment and recommendations, at the time. Ms. Crean recent genetic test results were disclosed to her, as were recommendations warranted by these results. These results and recommendations are discussed in more detail below.  CANCER HISTORY:  Oncology History  Malignant neoplasm of upper-outer quadrant of right breast in female, estrogen receptor negative ([place])  04/08/2021 Initial Diagnosis   History of right breast eczematous change to the nipple, patient had palpable lump in the right breast her mammogram 2.1 cm spiculated mass with extensive calcifications that span 11 cm.  Ultrasound revealed 3 cm mass at 10:00 plus small satellite lesions.  Biopsy revealed grade 3 IDC ER 0%, PR 0%, HER2 3+ positive, Ki-67 30%   04/08/2021 Cancer Staging   Staging form: Breast, AJCC 8th Edition - Clinical stage from 04/08/2021: Stage IIB (cT2, cN1, cM0, G3, ER-, PR-, HER2+) - Signed by Nicholas Lose, MD on 04/15/2021 Stage prefix: Initial diagnosis Method of lymph node assessment: Axillary lymph node dissection Histologic grading system: 3 grade system    05/01/2021 -  Chemotherapy   Patient is on Treatment Plan : BREAST  Docetaxel + Carboplatin + Trastuzumab + Pertuzumab  (TCHP) q21d      05/21/2021 Genetic Testing   Negative hereditary cancer genetic testing: no pathogenic variants detected in [facility] CustomNext-Cancer +RNAinsight Panel.  Report date is 05/21/2021.   The CustomNext-Cancer+RNAinsight panel offered by [facility] includes sequencing and rearrangement analysis for the following 47 genes:  APC, ATM, AXIN2, BARD1, BMPR1A, BRCA1, BRCA2, BRIP1, CDH1, CDK4, CDKN2A, CHEK2, DICER1, EPCAM, GREM1, HOXB13,  MEN1, MLH1, MSH2, MSH3, MSH6, MUTYH, NBN, NF1, NF2, NTHL1, PALB2, PMS2, POLD1, POLE, PTEN, RAD51C, RAD51D, RECQL, RET, SDHA, SDHAF2, SDHB, SDHC, SDHD, SMAD4, SMARCA4, STK11, TP53, TSC1, TSC2, and VHL.  RNA data is routinely analyzed for use in variant interpretation for all genes.     FAMILY HISTORY:  We obtained a detailed, 4-generation family history.  Significant diagnoses are listed below:      Family History  Problem Relation Age of Onset   Breast cancer Maternal Grandmother 78   Skin cancer Maternal Grandfather      Ms. Willis is unaware of previous family history of genetic testing for hereditary cancer risks. Patient's maternal ancestors are of [place] descent, and paternal ancestors are of [place] European descent. There is no reported Ashkenazi Jewish ancestry. There is no known consanguinity.    GENETIC TEST RESULTS:  The [facility] CustomNext-Cancer +RNAinsight Panel found no pathogenic mutations. The CustomNext-Cancer+RNAinsight panel offered by [facility] includes sequencing and rearrangement analysis for the following 47 genes:  APC, ATM, AXIN2, BARD1, BMPR1A, BRCA1, BRCA2, BRIP1, CDH1, CDK4, CDKN2A, CHEK2, DICER1, EPCAM, GREM1, HOXB13, MEN1, MLH1, MSH2, MSH3, MSH6, MUTYH, NBN, NF1, NF2, NTHL1, PALB2, PMS2, POLD1, POLE, PTEN, RAD51C, RAD51D, RECQL, RET, SDHA, SDHAF2, SDHB, SDHC, SDHD, SMAD4, SMARCA4, STK11, TP53, TSC1, TSC2, and VHL.  RNA data is routinely analyzed for use in variant interpretation for all genes.  The test report has been scanned into EPIC and is located under the Molecular Pathology section of the Results Review tab.  A portion of the result report is included below for reference. Genetic testing reported out on May 21, 2021.      Even though a pathogenic variant  was not identified, possible explanations for the cancer in the family may include: There may be no hereditary risk for cancer in the family. The cancers in Ms. Mathison and/or her family  may be sporadic/familial or due to other genetic and environmental factors. There may be a gene mutation in one of these genes that current testing methods cannot detect but that chance is small. There could be another gene that has not yet been discovered, or that we have not yet tested, that is responsible for the cancer diagnoses in the family.  It is also possible there is a hereditary cause for the cancer in the family that Ms. Neira did not inherit.   Therefore, it is important to remain in touch with cancer genetics in the future so that we can continue to offer Ms. Roes the most up to date genetic testing.   ADDITIONAL GENETIC TESTING:  We discussed with Ms. Kildow that her genetic testing was fairly extensive.  If there are genes identified to increase cancer risk that can be analyzed in the future, we would be happy to discuss and coordinate this testing at that time.    CANCER SCREENING RECOMMENDATIONS:  Ms. Evitt test result is considered negative (normal).  This means that we have not identified a hereditary cause for her personal history of breast cancer at this time.   An individual's cancer risk and medical management are not determined by genetic test results alone. Overall cancer risk assessment incorporates additional factors, including personal medical history, family history, and any available genetic information that may result in a personalized plan for cancer prevention and surveillance. Therefore, it is recommended she continue to follow the cancer management and screening guidelines provided by her oncology and primary healthcare provider.  RECOMMENDATIONS FOR FAMILY MEMBERS:   Since she did not inherit a identifiable mutation in a cancer predisposition gene included on this panel, her children could not have inherited a known mutation from her in one of these genes. Individuals in this family might be at some increased risk of developing cancer, over the general  population risk, due to the family history of cancer.  Individuals in the family should notify their providers of the family history of cancer. We recommend women in this family have a yearly mammogram beginning at age 72, or 66 years younger than the earliest onset of cancer, an annual clinical breast exam, and perform monthly breast self-exams.   Other members of the family may still carry a pathogenic variant in one of these genes that Ms. Lavigne did not inherit. Based on the family history of breast cancer before age 10 in her maternal grandmother, we recommend her mother and maternal aunt have genetic counseling and testing. Ms. Mccully can let us know if we can be of any assistance in coordinating genetic counseling and/or testing for this family member.     FOLLOW-UP:  Lastly, we discussed with Ms. Ziebarth that cancer genetics is a rapidly advancing field and it is possible that new genetic tests will be appropriate for her and/or her family members in the future. We encouraged her to remain in contact with cancer genetics on an annual basis so we can update her personal and family histories and let her know of advances in cancer genetics that may benefit this family.   Our contact number was provided. Ms. Nouri questions were answered to her satisfaction, and she knows she is welcome to call us at anytime with additional questions or concerns.  Kalyn Dimattia M. [name], [place], [facility] Genetic Counselor [email or web address] (P) [phone]

## 2021-05-25 NOTE — Progress Notes (Signed)
MD reviewed pt recent UA results.  Verbal orders received for pt to start taking Levaquin 500 mg p.o daily x7 days. Prescription sent to pharmacy on file.  RN attempt x 1 to contact pt regarding prescription.  No answer, LVM for pt to return call to the office.

## 2021-05-26 ENCOUNTER — Encounter: Payer: Commercial Managed Care - PPO | Admitting: Adult Health

## 2021-05-29 ENCOUNTER — Telehealth: Payer: Commercial Managed Care - PPO | Admitting: Plastic Surgery

## 2021-06-02 ENCOUNTER — Telehealth: Payer: Self-pay

## 2021-06-02 NOTE — Telephone Encounter (Signed)
Attempted to return pt's call to make her aware we will refer her to pelvic rehab. Pt did not answer; LVM for return call. ?

## 2021-06-02 NOTE — Telephone Encounter (Signed)
Pt called and LVM asking if there is anything she can do for vaginal dryness/painful intercourse. Attempted to return pt's ?

## 2021-06-03 ENCOUNTER — Other Ambulatory Visit: Payer: Self-pay | Admitting: *Deleted

## 2021-06-03 DIAGNOSIS — C50411 Malignant neoplasm of upper-outer quadrant of right female breast: Secondary | ICD-10-CM

## 2021-06-03 NOTE — Progress Notes (Signed)
Received call from pt with complaint of continuous discomfort during intercourse despite applying coconut oil and Relens. Reviewed with MD and verbal orders received for pt to be seen in out pt rehab for pelvic floor exercises.  Referral placed, pt educated and verbalized understanding.   ?

## 2021-06-09 ENCOUNTER — Encounter: Payer: Self-pay | Admitting: Hematology and Oncology

## 2021-06-10 NOTE — Progress Notes (Signed)
? ?Patient Care Team: ?Group, [facility] as PCP - General ?Mauro Kaufmann, RN as Oncology Nurse Navigator ?Rockwell Germany, RN as Oncology Nurse Navigator ?Nicholas Lose, MD as Consulting Physician (Hematology and Oncology) ?Coralie Keens, MD as Consulting Physician (General Surgery) ?Eppie Gibson, MD as Attending Physician (Radiation Oncology) ? ?DIAGNOSIS:  ?Encounter Diagnosis  ?Name Primary?  ? Malignant neoplasm of upper-outer quadrant of right breast in female, estrogen receptor negative ([place])   ? ? ?SUMMARY OF ONCOLOGIC HISTORY: ?Oncology History  ?Malignant neoplasm of upper-outer quadrant of right breast in female, estrogen receptor negative ([place])  ?04/08/2021 Initial Diagnosis  ? History of right breast eczematous change to the nipple, patient had palpable lump in the right breast her mammogram 2.1 cm spiculated mass with extensive calcifications that span 11 cm.  Ultrasound revealed 3 cm mass at 10:00 plus small satellite lesions.  Biopsy revealed grade 3 IDC ER 0%, PR 0%, HER2 3+ positive, Ki-67 30% ?  ?04/08/2021 Cancer Staging  ? Staging form: Breast, AJCC 8th Edition ?- Clinical stage from 04/08/2021: Stage IIB (cT2, cN1, cM0, G3, ER-, PR-, HER2+) - Signed by Nicholas Lose, MD on 04/15/2021 ?Stage prefix: Initial diagnosis ?Method of lymph node assessment: Axillary lymph node dissection ?Histologic grading system: 3 grade system ? ?  ?05/01/2021 -  Chemotherapy  ? Patient is on Treatment Plan : BREAST  Docetaxel + Carboplatin + Trastuzumab + Pertuzumab  (TCHP) q21d   ?   ?05/21/2021 Genetic Testing  ? Negative hereditary cancer genetic testing: no pathogenic variants detected in [facility] CustomNext-Cancer +RNAinsight Panel.  Report date is 05/21/2021.  ? ?The CustomNext-Cancer+RNAinsight panel offered by [facility] includes sequencing and rearrangement analysis for the following 47 genes:  APC, ATM, AXIN2, BARD1, BMPR1A, BRCA1, BRCA2, BRIP1, CDH1, CDK4, CDKN2A, CHEK2, DICER1, EPCAM, GREM1,  HOXB13, MEN1, MLH1, MSH2, MSH3, MSH6, MUTYH, NBN, NF1, NF2, NTHL1, PALB2, PMS2, POLD1, POLE, PTEN, RAD51C, RAD51D, RECQL, RET, SDHA, SDHAF2, SDHB, SDHC, SDHD, SMAD4, SMARCA4, STK11, TP53, TSC1, TSC2, and VHL.  RNA data is routinely analyzed for use in variant interpretation for all genes. ?  ? ?CHIEF COMPLIANT: Follow-up of right breast cancer ? ?INTERVAL HISTORY: Tara White is a  30 y.o. with above-mentioned history of right breast cancer. She presents to the clinic today for follow-up.  She continues to have recurrent issues with vaginal dryness.  Overall she is tolerating the chemo fairly well.  Mouth sores have resolved because she is using ice during treatment.  Denies any nausea or vomiting and she is using prophylactic Zofran which has been helping her.  She [place] acneform rash on the face. ? ? ?ALLERGIES:  has No Known Allergies. ? ?MEDICATIONS:  ?Current Outpatient Medications  ?Medication Sig Dispense Refill  ? dexamethasone (DECADRON) 4 MG tablet Take 2 tablets (8 mg total) by mouth 2 (two) times daily. Take 1 tablet day before chemo and 1 tablet day after chemo with food 12 tablet 0  ? fluconazole (DIFLUCAN) 150 MG tablet Take 1 tablet (150 mg total) by mouth daily. 2 tablet 0  ? Homeopathic Products (FRANKINCENSE UPLIFTING) OIL Apply 2 drops topically.    ? HYDROcodone-acetaminophen (NORCO) 5-325 MG tablet Take 1 tablet by mouth every 6 (six) hours as needed for moderate pain. 20 tablet 0  ? levofloxacin (LEVAQUIN) 500 MG tablet Take 1 tablet (500 mg total) by mouth daily. 7 tablet 0  ? lidocaine-prilocaine (EMLA) cream Apply to affected area once 30 g 3  ? LORazepam (ATIVAN) 0.5 MG tablet Take 1  tablet (0.5 mg total) by mouth at bedtime as needed (Nausea or vomiting). 30 tablet 0  ? magic mouthwash w/lidocaine SOLN Take 5 mLs by mouth 4 (four) times daily as needed for mouth pain. 240 mL 1  ? nitrofurantoin, macrocrystal-monohydrate, (MACROBID) 100 MG capsule Take 1 capsule (100 mg total) by mouth 2  (two) times daily. 10 capsule 0  ? ondansetron (ZOFRAN) 8 MG tablet Take 1 tablet (8 mg total) by mouth 2 (two) times daily as needed (Nausea or vomiting). Start on the third day after chemotherapy. 30 tablet 1  ? prochlorperazine (COMPAZINE) 10 MG tablet Take 1 tablet (10 mg total) by mouth every 6 (six) hours as needed (Nausea or vomiting). 30 tablet 1  ? ?No current facility-administered medications for this visit.  ? ? ?PHYSICAL EXAMINATION: ?ECOG PERFORMANCE STATUS: 1 - Symptomatic but completely ambulatory ? ?Vitals:  ? 06/12/21 0933  ?BP: 123/77  ?Pulse: 99  ?Resp: 18  ?Temp: 97.9 ?F (36.6 ?C)  ?SpO2: 100%  ? ?Filed Weights  ? 06/12/21 0933  ?Weight: 155 lb 8 oz (70.5 kg)  ?  ? ?LABORATORY DATA:  ?I have reviewed the data as listed ?CMP Latest Ref Rng & Units 05/22/2021 05/08/2021 04/28/2021  ?Glucose 70 - 99 mg/dL 84 93 83  ?BUN 6 - 20 mg/dL 17 20 18   ?Creatinine 0.44 - 1.00 mg/dL 0.88 0.84 0.94  ?Sodium 135 - 145 mmol/L 139 137 139  ?Potassium 3.5 - 5.1 mmol/L 3.3(L) 3.9 4.0  ?Chloride 98 - 111 mmol/L 105 106 107  ?CO2 22 - 32 mmol/L 28 25 26   ?Calcium 8.9 - 10.3 mg/dL 9.3 9.0 9.8  ?Total Protein 6.5 - 8.1 g/dL 6.9 6.9 7.7  ?Total Bilirubin 0.3 - 1.2 mg/dL 0.4 0.3 0.4  ?Alkaline Phos 38 - 126 U/L 82 57 49  ?AST 15 - 41 U/L 18 17 17   ?ALT 0 - 44 U/L 32 19 14  ? ? ?Lab Results  ?Component Value Date  ? WBC 14.1 (H) 06/12/2021  ? HGB 11.4 (L) 06/12/2021  ? HCT 33.2 (L) 06/12/2021  ? MCV 88.8 06/12/2021  ? PLT 192 06/12/2021  ? NEUTROABS 10.1 (H) 06/12/2021  ? ? ?ASSESSMENT & PLAN:  ?Malignant neoplasm of upper-outer quadrant of right breast in female, estrogen receptor negative ([place]) ?04/08/2021:History of right breast eczematous change to the nipple, patient had palpable lump in the right breast her mammogram 2.1 cm spiculated mass with extensive calcifications that span 11 cm.  Ultrasound revealed 3 cm mass at 10:00 plus small satellite lesions.  Biopsy revealed grade 3 IDC ER 0%, PR 0%, HER2 3+ positive, Ki-67  30% ?  ?Recommendation: ?1. Neoadjuvant chemotherapy with [place] Perjeta ?6 cycles followed by Herceptin Perjeta maintenance versus Kadcyla maintenance (based on response to neoadjuvant chemo) for 1 year ?2. Followed by mastectomy with reconstruction ?3. Followed by adjuvant radiation therapy   ?  ?Breast MRI 04/27/2021: Extensive non-mass enhancement throughout all quadrants of the breast largest confluent area 2.9 cm, right nipple enhancement consistent with DCIS, 1.9 cm right axillary lymph node, left breast negative ?----------------------------------------------------------------------------------------------------------------------------------------------- ?CT CAP and bone scan: To my review there does not appear to be any evidence of metastatic disease. ?Current treatment: Cycle 3 neoadjuvant chemo TCH Perjeta start 05/01/2021 ?  ?Chemo Toxicities: ?Fatigue ?Multiple sores: Resolved ?Initially constipation but 1 episode of diarrhea ?Vaginal dryness: Using Replens.  But still having issues. ?Acneform rash on the face: I sent a prescription for topical clindamycin. ?  ?She is interested in  trying intermittent fasting through chemotherapy which I supported. ?She is planning to meet with different plastic surgeons to get opinions on reconstruction. ?RTC in 3 weeks for cycle 4 ? ? ? ?No orders of the defined types were placed in this encounter. ? ?The patient has a good understanding of the overall plan. she agrees with it. she will call with any problems that may develop before the next visit here. ?Total time spent: 30 mins including face to face time and time spent for planning, charting and co-ordination of care ? ? Harriette Ohara, MD ?06/12/21 ?I, Gardiner Coins, am acting as a Education administrator for Dr. Lindi Adie  ? ?  ?

## 2021-06-12 ENCOUNTER — Other Ambulatory Visit: Payer: Self-pay

## 2021-06-12 ENCOUNTER — Inpatient Hospital Stay: Payer: Commercial Managed Care - PPO | Attending: Hematology and Oncology

## 2021-06-12 ENCOUNTER — Inpatient Hospital Stay: Payer: Commercial Managed Care - PPO

## 2021-06-12 ENCOUNTER — Inpatient Hospital Stay (HOSPITAL_BASED_OUTPATIENT_CLINIC_OR_DEPARTMENT_OTHER): Payer: Commercial Managed Care - PPO | Admitting: Hematology and Oncology

## 2021-06-12 ENCOUNTER — Inpatient Hospital Stay: Payer: Commercial Managed Care - PPO | Admitting: Hematology and Oncology

## 2021-06-12 DIAGNOSIS — Z79899 Other long term (current) drug therapy: Secondary | ICD-10-CM | POA: Insufficient documentation

## 2021-06-12 DIAGNOSIS — C50411 Malignant neoplasm of upper-outer quadrant of right female breast: Secondary | ICD-10-CM | POA: Insufficient documentation

## 2021-06-12 DIAGNOSIS — Z5111 Encounter for antineoplastic chemotherapy: Secondary | ICD-10-CM | POA: Diagnosis present

## 2021-06-12 DIAGNOSIS — Z171 Estrogen receptor negative status [ER-]: Secondary | ICD-10-CM

## 2021-06-12 DIAGNOSIS — Z95828 Presence of other vascular implants and grafts: Secondary | ICD-10-CM

## 2021-06-12 LAB — CBC WITH DIFFERENTIAL (CANCER CENTER ONLY)
Abs Immature Granulocytes: 0.06 10*3/uL (ref 0.00–0.07)
Basophils Absolute: 0 10*3/uL (ref 0.0–0.1)
Basophils Relative: 0 %
Eosinophils Absolute: 0 10*3/uL (ref 0.0–0.5)
Eosinophils Relative: 0 %
HCT: 33.2 % — ABNORMAL LOW (ref 36.0–46.0)
Hemoglobin: 11.4 g/dL — ABNORMAL LOW (ref 12.0–15.0)
Immature Granulocytes: 0 %
Lymphocytes Relative: 21 %
Lymphs Abs: 2.9 10*3/uL (ref 0.7–4.0)
MCH: 30.5 pg (ref 26.0–34.0)
MCHC: 34.3 g/dL (ref 30.0–36.0)
MCV: 88.8 fL (ref 80.0–100.0)
Monocytes Absolute: 1 10*3/uL (ref 0.1–1.0)
Monocytes Relative: 7 %
Neutro Abs: 10.1 10*3/uL — ABNORMAL HIGH (ref 1.7–7.7)
Neutrophils Relative %: 72 %
Platelet Count: 192 10*3/uL (ref 150–400)
RBC: 3.74 MIL/uL — ABNORMAL LOW (ref 3.87–5.11)
RDW: 14.8 % (ref 11.5–15.5)
WBC Count: 14.1 10*3/uL — ABNORMAL HIGH (ref 4.0–10.5)
nRBC: 0 % (ref 0.0–0.2)

## 2021-06-12 LAB — COMPREHENSIVE METABOLIC PANEL
ALT: 34 U/L (ref 0–44)
AST: 21 U/L (ref 15–41)
Albumin: 4.3 g/dL (ref 3.5–5.0)
Alkaline Phosphatase: 87 U/L (ref 38–126)
Anion gap: 7 (ref 5–15)
BUN: 23 mg/dL — ABNORMAL HIGH (ref 6–20)
CO2: 26 mmol/L (ref 22–32)
Calcium: 9.3 mg/dL (ref 8.9–10.3)
Chloride: 107 mmol/L (ref 98–111)
Creatinine, Ser: 0.91 mg/dL (ref 0.44–1.00)
GFR, Estimated: >60 mL/min (ref >60)
Glucose, Bld: 104 mg/dL — ABNORMAL HIGH (ref 70–99)
Potassium: 3.6 mmol/L (ref 3.5–5.1)
Sodium: 140 mmol/L (ref 135–145)
Total Bilirubin: 0.4 mg/dL (ref 0.3–1.2)
Total Protein: 7.2 g/dL (ref 6.5–8.1)

## 2021-06-12 LAB — PREGNANCY, URINE: Preg Test, Ur: NEGATIVE

## 2021-06-12 MED ORDER — SODIUM CHLORIDE 0.9% FLUSH
10.0000 mL | INTRAVENOUS | Status: AC | PRN
  Administered 2021-06-12: 10 mL

## 2021-06-12 MED ORDER — SODIUM CHLORIDE 0.9 % IV SOLN
150.0000 mg | Freq: Once | INTRAVENOUS | Status: AC
  Administered 2021-06-12: 150 mg via INTRAVENOUS
  Filled 2021-06-12: qty 150

## 2021-06-12 MED ORDER — SODIUM CHLORIDE 0.9 % IV SOLN
420.0000 mg | Freq: Once | INTRAVENOUS | Status: AC
  Administered 2021-06-12: 420 mg via INTRAVENOUS
  Filled 2021-06-12: qty 14

## 2021-06-12 MED ORDER — SODIUM CHLORIDE 0.9 % IV SOLN
75.0000 mg/m2 | Freq: Once | INTRAVENOUS | Status: AC
  Administered 2021-06-12: 140 mg via INTRAVENOUS
  Filled 2021-06-12: qty 14

## 2021-06-12 MED ORDER — SODIUM CHLORIDE 0.9 % IV SOLN: Freq: Once | INTRAVENOUS | Status: AC

## 2021-06-12 MED ORDER — ACETAMINOPHEN 325 MG PO TABS
650.0000 mg | ORAL_TABLET | Freq: Once | ORAL | Status: AC
  Administered 2021-06-12: 650 mg via ORAL
  Filled 2021-06-12: qty 2

## 2021-06-12 MED ORDER — PALONOSETRON HCL INJECTION 0.25 MG/5ML
0.2500 mg | Freq: Once | INTRAVENOUS | Status: AC
  Administered 2021-06-12: 0.25 mg via INTRAVENOUS
  Filled 2021-06-12: qty 5

## 2021-06-12 MED ORDER — HEPARIN SOD (PORK) LOCK FLUSH 100 UNIT/ML IV SOLN
500.0000 [IU] | Freq: Once | INTRAVENOUS | Status: AC | PRN
  Administered 2021-06-12: 500 [IU]

## 2021-06-12 MED ORDER — TRASTUZUMAB-ANNS CHEMO 150 MG IV SOLR
6.0000 mg/kg | Freq: Once | INTRAVENOUS | Status: AC
  Administered 2021-06-12: 420 mg via INTRAVENOUS
  Filled 2021-06-12: qty 20

## 2021-06-12 MED ORDER — DIPHENHYDRAMINE HCL 25 MG PO CAPS
50.0000 mg | ORAL_CAPSULE | Freq: Once | ORAL | Status: AC
  Administered 2021-06-12: 50 mg via ORAL
  Filled 2021-06-12: qty 2

## 2021-06-12 MED ORDER — SODIUM CHLORIDE 0.9% FLUSH
10.0000 mL | INTRAVENOUS | Status: DC | PRN
  Administered 2021-06-12: 10 mL

## 2021-06-12 MED ORDER — SODIUM CHLORIDE 0.9 % IV SOLN
10.0000 mg | Freq: Once | INTRAVENOUS | Status: AC
  Administered 2021-06-12: 10 mg via INTRAVENOUS
  Filled 2021-06-12: qty 10

## 2021-06-12 MED ORDER — CLINDAMYCIN PHOSPHATE 1 % EX GEL: Freq: Two times a day (BID) | CUTANEOUS | 0 refills | Status: DC

## 2021-06-12 MED ORDER — SODIUM CHLORIDE 0.9 % IV SOLN
740.0000 mg | Freq: Once | INTRAVENOUS | Status: AC
  Administered 2021-06-12: 740 mg via INTRAVENOUS
  Filled 2021-06-12: qty 74

## 2021-06-12 NOTE — Assessment & Plan Note (Signed)
04/08/2021:History of right breast eczematous change to the nipple, patient had palpable lump in the right breast her mammogram 2.1 cm spiculated mass with extensive calcifications that span 11 cm. ?Ultrasound revealed 3 cm mass at 10:00 plus small satellite lesions. ?Biopsy revealed grade 3 IDC ER 0%, PR 0%, HER2 3+ positive, Ki-67 30% ?? ?Recommendation: ?1. Neoadjuvant chemotherapy with [place] Perjeta ?6 cycles followed by Herceptin Perjeta maintenance versus Kadcyla maintenance (based on response to neoadjuvant chemo) for 1 year ?2. Followed by?mastectomy with reconstruction ?3. Followed by adjuvant radiation therapy?? ?? ?Breast MRI 04/27/2021: Extensive non-mass enhancement throughout all quadrants of the breast largest confluent area 2.9 cm, right nipple enhancement consistent with DCIS, 1.9 cm right axillary lymph node, left breast negative ?----------------------------------------------------------------------------------------------------------------------------------------------- ?CT CAP and bone scan:?To my review there does not appear to be any evidence of metastatic disease. ?Current treatment: Cycle 3?neoadjuvant chemo TCH Perjeta start 05/01/2021 ?? ?Chemo Toxicities: ?1. Fatigue ?2. Multiple sores: Resolved ?3. Initially constipation but 1 episode of diarrhea ?4. UTI: Treated with antibiotics. ?5. Vaginal dryness: Encouraged her to use Replens. ?? ?  ?RTC in 3 weeks for cycle 4 ?

## 2021-06-12 NOTE — Patient Instructions (Signed)
[facility]   ?Discharge Instructions: ?Thank you for choosing [facility] to provide your oncology and hematology care.  ? ?If you have a lab appointment with the [facility], please go directly to the [facility] and check in at the registration area. ?  ?Wear comfortable clothing and clothing appropriate for easy access to any Portacath or PICC line.  ? ?We strive to give you quality time with your provider. You may need to reschedule your appointment if you arrive late (15 or more minutes).  Arriving late affects you and other patients whose appointments are after yours.  Also, if you miss three or more appointments without notifying the office, you may be dismissed from the clinic at the provider?s discretion.    ?  ?For prescription refill requests, have your pharmacy contact our office and allow 72 hours for refills to be completed.   ? ?Today you received the following chemotherapy and/or immunotherapy agents: Trastuzumab (Herceptin), Pertuzumab (Perjeta), Docetaxel (Taxotere), and Carboplatin    ?  ?To help prevent nausea and vomiting after your treatment, we encourage you to take your nausea medication as directed. ? ?BELOW ARE SYMPTOMS THAT SHOULD BE REPORTED IMMEDIATELY: ?*FEVER GREATER THAN 100.4 F (38 ?C) OR HIGHER ?*CHILLS OR SWEATING ?*NAUSEA AND VOMITING THAT IS NOT CONTROLLED WITH YOUR NAUSEA MEDICATION ?*UNUSUAL SHORTNESS OF BREATH ?*UNUSUAL BRUISING OR BLEEDING ?*URINARY PROBLEMS (pain or burning when urinating, or frequent urination) ?*BOWEL PROBLEMS (unusual diarrhea, constipation, pain near the anus) ?TENDERNESS IN MOUTH AND THROAT WITH OR WITHOUT PRESENCE OF ULCERS (sore throat, sores in mouth, or a toothache) ?UNUSUAL RASH, SWELLING OR PAIN  ?UNUSUAL VAGINAL DISCHARGE OR ITCHING  ? ?Items with * indicate a potential emergency and should be followed up as soon as possible or go to the Emergency Department if any problems should occur. ? ?Please  show the CHEMOTHERAPY ALERT CARD or IMMUNOTHERAPY ALERT CARD at check-in to the Emergency Department and triage nurse. ? ?Should you have questions after your visit or need to cancel or reschedule your appointment, please contact [facility]  Dept: [phone]  and follow the prompts.  Office hours are 8:00 a.m. to 4:30 p.m. Monday - Friday. Please note that voicemails left after 4:00 p.m. may not be returned until the following business day.  We are closed weekends and major holidays. You have access to a nurse at all times for urgent questions. Please call the main number to the clinic Dept: [phone] and follow the prompts. ? ? ?For any non-urgent questions, you may also contact your provider using MyChart. We now offer e-Visits for anyone 60 and older to request care online for non-urgent symptoms. For details visit [email or web address]. ?  ?Also download the MyChart app! Go to the app store, search "MyChart", open the app, select [facility], and log in with your MyChart username and password. ? ?Due to Covid, a mask is required upon entering the hospital/clinic. If you do not have a mask, one will be given to you upon arrival. For doctor visits, patients may have 1 support person aged 77 or older with them. For treatment visits, patients cannot have anyone with them due to current Covid guidelines and our immunocompromised population.  ? ?

## 2021-06-15 ENCOUNTER — Inpatient Hospital Stay: Payer: Commercial Managed Care - PPO

## 2021-06-15 ENCOUNTER — Other Ambulatory Visit: Payer: Self-pay

## 2021-06-15 ENCOUNTER — Encounter: Payer: Self-pay | Admitting: *Deleted

## 2021-06-15 ENCOUNTER — Encounter: Payer: Self-pay | Admitting: Hematology and Oncology

## 2021-06-15 DIAGNOSIS — C50411 Malignant neoplasm of upper-outer quadrant of right female breast: Secondary | ICD-10-CM | POA: Diagnosis not present

## 2021-06-15 DIAGNOSIS — Z171 Estrogen receptor negative status [ER-]: Secondary | ICD-10-CM

## 2021-06-15 MED ORDER — PEGFILGRASTIM-BMEZ 6 MG/0.6ML ~~LOC~~ SOSY
6.0000 mg | PREFILLED_SYRINGE | Freq: Once | SUBCUTANEOUS | Status: AC
  Administered 2021-06-15: 6 mg via SUBCUTANEOUS
  Filled 2021-06-15: qty 0.6

## 2021-06-15 NOTE — Progress Notes (Signed)
Referral placed per MD  ?

## 2021-06-15 NOTE — Progress Notes (Signed)
Per pt request RN successfully faxed referral to Dr. Iran Planas [phone]. ?

## 2021-06-17 ENCOUNTER — Encounter: Payer: Self-pay | Admitting: Hematology and Oncology

## 2021-06-19 ENCOUNTER — Encounter: Payer: Self-pay | Admitting: Hematology and Oncology

## 2021-06-22 ENCOUNTER — Other Ambulatory Visit: Payer: Self-pay | Admitting: *Deleted

## 2021-06-22 ENCOUNTER — Inpatient Hospital Stay (HOSPITAL_BASED_OUTPATIENT_CLINIC_OR_DEPARTMENT_OTHER): Payer: Commercial Managed Care - PPO | Admitting: Adult Health

## 2021-06-22 ENCOUNTER — Ambulatory Visit (HOSPITAL_COMMUNITY)
Admission: RE | Admit: 2021-06-22 | Discharge: 2021-06-22 | Disposition: A | Discharge status: Home / Self Care | Payer: Commercial Managed Care - PPO | Source: Ambulatory Visit | Attending: Hematology and Oncology | Admitting: Hematology and Oncology

## 2021-06-22 ENCOUNTER — Other Ambulatory Visit: Payer: Self-pay

## 2021-06-22 ENCOUNTER — Encounter: Payer: Self-pay | Admitting: Hematology and Oncology

## 2021-06-22 VITALS — BP 127/81 | HR 79 | Temp 97.7°F | Resp 16 | Ht 69.0 in | Wt 159.7 lb

## 2021-06-22 DIAGNOSIS — C50411 Malignant neoplasm of upper-outer quadrant of right female breast: Secondary | ICD-10-CM | POA: Diagnosis not present

## 2021-06-22 DIAGNOSIS — Z171 Estrogen receptor negative status [ER-]: Secondary | ICD-10-CM | POA: Insufficient documentation

## 2021-06-22 MED ORDER — AMOXICILLIN-POT CLAVULANATE 875-125 MG PO TABS: 1.0000 | ORAL_TABLET | Freq: Two times a day (BID) | ORAL | 0 refills | Status: DC

## 2021-06-22 NOTE — Progress Notes (Signed)
Upper extremity venous LT study completed.  ? ?Please see CV Proc for preliminary results.  ? ?Darlin Coco, RDMS, RVT ? ?

## 2021-06-22 NOTE — Progress Notes (Signed)
Received message from pt with complaint of left arm redness and swelling x1 day.  Pt denies recent injury or trauma.  Per MD pt needing STAT VAS [place] to r/o DVT as well as [place] f/u.  Appt scheduled and pt notified.  ?

## 2021-06-23 ENCOUNTER — Other Ambulatory Visit (HOSPITAL_COMMUNITY): Payer: Self-pay | Admitting: Physician Assistant

## 2021-06-23 ENCOUNTER — Telehealth: Payer: Self-pay

## 2021-06-23 ENCOUNTER — Other Ambulatory Visit: Payer: Self-pay

## 2021-06-23 ENCOUNTER — Encounter: Payer: Self-pay | Admitting: Hematology and Oncology

## 2021-06-23 DIAGNOSIS — C50411 Malignant neoplasm of upper-outer quadrant of right female breast: Secondary | ICD-10-CM

## 2021-06-23 NOTE — Telephone Encounter (Signed)
Called pt to advise of port-a-cath dye study on 06/25/21 @ 8:30 AM. ?

## 2021-06-23 NOTE — Progress Notes (Signed)
[facility] Follow up: ?  ? ?[facility] ?[address] ?[place] [address] ? ? ?DIAGNOSIS:  Cancer Staging  ?Malignant neoplasm of upper-outer quadrant of right breast in female, estrogen receptor negative ([place]) ?Staging form: Breast, AJCC 8th Edition ?- Clinical stage from 04/08/2021: Stage IIB (cT2, cN1, cM0, G3, ER-, PR-, HER2+) - Signed by Nicholas Lose, MD on 04/15/2021 ?Stage prefix: Initial diagnosis ?Method of lymph node assessment: Axillary lymph node dissection ?Histologic grading system: 3 grade system ? ? ?SUMMARY OF ONCOLOGIC HISTORY: ?Oncology History  ?Malignant neoplasm of upper-outer quadrant of right breast in female, estrogen receptor negative ([place])  ?04/08/2021 Initial Diagnosis  ? History of right breast eczematous change to the nipple, patient had palpable lump in the right breast her mammogram 2.1 cm spiculated mass with extensive calcifications that span 11 cm.  Ultrasound revealed 3 cm mass at 10:00 plus small satellite lesions.  Biopsy revealed grade 3 IDC ER 0%, PR 0%, HER2 3+ positive, Ki-67 30% ?  ?04/08/2021 Cancer Staging  ? Staging form: Breast, AJCC 8th Edition ?- Clinical stage from 04/08/2021: Stage IIB (cT2, cN1, cM0, G3, ER-, PR-, HER2+) - Signed by Nicholas Lose, MD on 04/15/2021 ?Stage prefix: Initial diagnosis ?Method of lymph node assessment: Axillary lymph node dissection ?Histologic grading system: 3 grade system ? ?  ?05/01/2021 -  Chemotherapy  ? Patient is on Treatment Plan : BREAST  Docetaxel + Carboplatin + Trastuzumab + Pertuzumab  (TCHP) q21d   ?   ?05/21/2021 Genetic Testing  ? Negative hereditary cancer genetic testing: no pathogenic variants detected in [facility] CustomNext-Cancer +RNAinsight Panel.  Report date is 05/21/2021.  ? ?The CustomNext-Cancer+RNAinsight panel offered by [facility] includes sequencing and rearrangement analysis for the following 47 genes:  APC, ATM, AXIN2, BARD1, BMPR1A, BRCA1, BRCA2, BRIP1, CDH1, CDK4, CDKN2A,  CHEK2, DICER1, EPCAM, GREM1, HOXB13, MEN1, MLH1, MSH2, MSH3, MSH6, MUTYH, NBN, NF1, NF2, NTHL1, PALB2, PMS2, POLD1, POLE, PTEN, RAD51C, RAD51D, RECQL, RET, SDHA, SDHAF2, SDHB, SDHC, SDHD, SMAD4, SMARCA4, STK11, TP53, TSC1, TSC2, and VHL.  RNA data is routinely analyzed for use in variant interpretation for all genes. ?  ? ? ?CURRENT THERAPY: Neoadjuvant chemotherapy with TCHP status post 3 cycles. ? ?INTERVAL HISTORY: ?Tara White 31 y.o. female returns for evaluation of unilateral left arm swelling.  This started several days ago.  She does have a left chest wall port implant was deaccessed last week there was increased bruising around the site.  Since that time she has developed progressive swelling in her left arm up into her left neck.  She underwent a Doppler earlier today that was negative for DVT.  She denies any redness, tenderness, fevers or chills. ? ?Of note her breast cancer is right-sided breast cancer.  She has not undergone any surgery or lymph node removal on either side. ? ? ?Patient Active Problem List  ? Diagnosis Date Noted  ? Family history of breast cancer 04/21/2021  ? Genetic testing 04/21/2021  ? Malignant neoplasm of upper-outer quadrant of right breast in female, estrogen receptor negative ([place]) 04/14/2021  ? Hypertension in pregnancy, preeclampsia, severe, delivered/postpartum 12/16/2015  ? Pre-eclampsia 12/15/2015  ? Postpartum care following cesarean delivery (9/13) 12/11/2015  ? Preeclampsia, severe 12/09/2015  ? ? ?has No Known Allergies. ? ?MEDICAL HISTORY: ?Past Medical History:  ?Diagnosis Date  ? Family history of breast cancer 04/21/2021  ? right breast ca 03/2021  ? ? ?SURGICAL HISTORY: ?Past Surgical History:  ?Procedure Laterality Date  ? CESAREAN  SECTION N/A 12/10/2015  ? Procedure: CESAREAN SECTION;  Surgeon: Aloha Gell, MD;  Location: [facility];  Service: Obstetrics;  Laterality: N/A;  ? PORTACATH PLACEMENT Left 04/30/2021  ? Procedure: INSERTION PORT-A-CATH;   Surgeon: Coralie Keens, MD;  Location: [facility];  Service: General;  Laterality: Left;  ? WISDOM TOOTH EXTRACTION    ? ? ?SOCIAL HISTORY: ?Social History  ? ?Socioeconomic History  ? Marital status: Married  ?  Spouse name: Not on file  ? Number of children: Not on file  ? Years of education: Not on file  ? Highest education level: Not on file  ?Occupational History  ? Not on file  ?Tobacco Use  ? Smoking status: Never  ? Smokeless tobacco: Never  ?Substance and Sexual Activity  ? Alcohol use: Not Currently  ?  Alcohol/week: 2.0 standard drinks  ?  Types: 2 Standard drinks or equivalent per week  ? Drug use: No  ? Sexual activity: Not Currently  ?  Partners: Male  ?  Birth control/protection: Pill  ?  Comment: Loestrin  ?Other Topics Concern  ? Not on file  ?Social History Narrative  ? Not on file  ? ?Social Determinants of Health  ? ?Financial Resource Strain: Not on file  ?Food Insecurity: Not on file  ?Transportation Needs: Not on file  ?Physical Activity: Not on file  ?Stress: Not on file  ?Social Connections: Not on file  ?Intimate Partner Violence: Not on file  ? ? ?FAMILY HISTORY: ?Family History  ?Problem Relation Age of Onset  ? Hypothyroidism Mother   ? Breast cancer Maternal Grandmother 50  ? Skin cancer Maternal Grandfather   ? Heart failure Paternal Grandfather   ? ? ?Review of Systems  ?Constitutional:  Negative for appetite change, chills, fatigue, fever and unexpected weight change.  ?HENT:   Negative for hearing loss, lump/mass and trouble swallowing.   ?Eyes:  Negative for eye problems and icterus.  ?Respiratory:  Negative for chest tightness, cough and shortness of breath.   ?Cardiovascular:  Negative for chest pain, leg swelling and palpitations.  ?Gastrointestinal:  Negative for abdominal distention, abdominal pain, constipation, diarrhea, nausea and vomiting.  ?Endocrine: Negative for hot flashes.  ?Genitourinary:  Negative for difficulty urinating.   ?Musculoskeletal:   Negative for arthralgias.  ?Skin:  Negative for itching and rash.  ?Neurological:  Negative for dizziness, extremity weakness, headaches and numbness.  ?Hematological:  Negative for adenopathy. Does not bruise/bleed easily.  ?Psychiatric/Behavioral:  Negative for depression. The patient is not nervous/anxious.    ? ? ?PHYSICAL EXAMINATION ? ?ECOG PERFORMANCE STATUS: 1 - Symptomatic but completely ambulatory ? ?Vitals:  ? 06/22/21 1519  ?BP: 127/81  ?Pulse: 79  ?Resp: 16  ?Temp: 97.7 ?F (36.5 ?C)  ?SpO2: 100%  ? ? ?Physical Exam ?Constitutional:   ?   General: She is not in acute distress. ?   Appearance: Normal appearance. She is not toxic-appearing.  ?HENT:  ?   Head: Normocephalic and atraumatic.  ?Eyes:  ?   General: No scleral icterus. ?Cardiovascular:  ?   Rate and Rhythm: Normal rate and regular rhythm.  ?   Pulses: Normal pulses.  ?   Heart sounds: Normal heart sounds.  ?Pulmonary:  ?   Effort: Pulmonary effort is normal.  ?   Breath sounds: Normal breath sounds.  ?Abdominal:  ?   General: Abdomen is flat. Bowel sounds are normal. There is no distension.  ?   Palpations: Abdomen is soft.  ?  Tenderness: There is no abdominal tenderness.  ?Musculoskeletal:     ?   General: Swelling (Left arm is markedly swollen more so than the right there is ecchymosis over the port site.  There is no warmth or erythema noted.) present.  ?   Cervical back: Neck supple.  ?Lymphadenopathy:  ?   Cervical: No cervical adenopathy.  ?Skin: ?   General: Skin is warm and dry.  ?   Findings: No rash.  ?Neurological:  ?   General: No focal deficit present.  ?   Mental Status: She is alert.  ?Psychiatric:     ?   Mood and Affect: Mood normal.     ?   Behavior: Behavior normal.  ? ? ?LABORATORY DATA: ? ?CBC ?   ?Component Value Date/Time  ? WBC 14.1 (H) 06/12/2021 0919  ? WBC 10.5 05/22/2021 1028  ? RBC 3.74 (L) 06/12/2021 0919  ? HGB 11.4 (L) 06/12/2021 0919  ? HGB 14.0 02/11/2015 1455  ? HCT 33.2 (L) 06/12/2021 0919  ? PLT 192  06/12/2021 0919  ? MCV 88.8 06/12/2021 0919  ? MCH 30.5 06/12/2021 0919  ? MCHC 34.3 06/12/2021 0919  ? RDW 14.8 06/12/2021 0919  ? LYMPHSABS 2.9 06/12/2021 0919  ? MONOABS 1.0 06/12/2021 0919  ? EOSABS 0.0 03/17/2

## 2021-06-23 NOTE — Assessment & Plan Note (Addendum)
04/08/2021:History of right breast eczematous change to the nipple, patient had palpable lump in the right breast her mammogram 2.1 cm spiculated mass with extensive calcifications that span 11 cm. ?Ultrasound revealed 3 cm mass at 10:00 plus small satellite lesions. ?Biopsy revealed grade 3 IDC ER 0%, PR 0%, HER2 3+ positive, Ki-67 30% ?? ?Recommendation: ?1. Neoadjuvant chemotherapy with [place] Perjeta ?6 cycles followed by Herceptin Perjeta maintenance versus Kadcyla maintenance (based on response to neoadjuvant chemo) for 1 year ?2. Followed by?mastectomy with reconstruction ?3. Followed by adjuvant radiation therapy?? ?? ?Breast MRI 04/27/2021: Extensive non-mass enhancement throughout all quadrants of the breast largest confluent area 2.9 cm, right nipple enhancement consistent with DCIS, 1.9 cm right axillary lymph node, left breast negative ?----------------------------------------------------------------------------------------------------------------------------------------------- ?CT CAP and bone scan:?To my review there does not appear to be any evidence of metastatic disease. ?Current treatment: Status post 3 cycles of neoadjuvant TCHP ? ?Her left arm swelling is perplexing because clinically it really aligns with a DVT.  I reviewed this with Dr. Lindi Adie and we will treat with antibiotics and get a dye study. ? ?I sent in a prescription for Augmentin twice daily that she will take.  She will call us or send [place] a MyChart message if anything gets worse. ?

## 2021-06-25 ENCOUNTER — Ambulatory Visit (HOSPITAL_COMMUNITY)
Admission: RE | Admit: 2021-06-25 | Discharge: 2021-06-25 | Disposition: A | Discharge status: Home / Self Care | Payer: Commercial Managed Care - PPO | Source: Ambulatory Visit | Attending: Adult Health | Admitting: Adult Health

## 2021-06-25 ENCOUNTER — Other Ambulatory Visit: Payer: Self-pay | Admitting: Adult Health

## 2021-06-25 ENCOUNTER — Other Ambulatory Visit: Payer: Self-pay

## 2021-06-25 ENCOUNTER — Ambulatory Visit: Payer: Commercial Managed Care - PPO

## 2021-06-25 DIAGNOSIS — Z171 Estrogen receptor negative status [ER-]: Secondary | ICD-10-CM | POA: Diagnosis not present

## 2021-06-25 DIAGNOSIS — C50411 Malignant neoplasm of upper-outer quadrant of right female breast: Secondary | ICD-10-CM | POA: Diagnosis present

## 2021-06-25 DIAGNOSIS — I82409 Acute embolism and thrombosis of unspecified deep veins of unspecified lower extremity: Secondary | ICD-10-CM

## 2021-06-25 HISTORY — PX: IR US GUIDE VASC ACCESS LEFT: IMG2389

## 2021-06-25 HISTORY — PX: IR CV LINE INJECTION: IMG2294

## 2021-06-25 HISTORY — PX: IR VENO/EXT/UNI LEFT: IMG675

## 2021-06-25 HISTORY — DX: Acute embolism and thrombosis of unspecified deep veins of unspecified lower extremity: I82.409

## 2021-06-25 HISTORY — PX: IR RADIOLOGY PERIPHERAL GUIDED IV START: IMG5598

## 2021-06-25 MED ORDER — LIDOCAINE HCL 1 % IJ SOLN
INTRAMUSCULAR | Status: DC | PRN
  Administered 2021-06-25: 10 mL

## 2021-06-25 MED ORDER — HEPARIN SOD (PORK) LOCK FLUSH 100 UNIT/ML IV SOLN: 500.0000 [IU] | Freq: Once | INTRAVENOUS | Status: DC

## 2021-06-25 MED ORDER — RIVAROXABAN (XARELTO) VTE STARTER PACK (15 & 20 MG): ORAL_TABLET | ORAL | 0 refills | Status: DC

## 2021-06-25 MED ORDER — IOHEXOL 300 MG/ML  SOLN
50.0000 mL | Freq: Once | INTRAMUSCULAR | Status: AC | PRN
  Administered 2021-06-25: 10 mL via INTRAVENOUS

## 2021-06-25 MED ORDER — LIDOCAINE HCL 1 % IJ SOLN
INTRAMUSCULAR | Status: AC
  Filled 2021-06-25: qty 20

## 2021-06-25 MED ORDER — IOHEXOL 300 MG/ML  SOLN
50.0000 mL | Freq: Once | INTRAMUSCULAR | Status: AC | PRN
  Administered 2021-06-25: 40 mL via INTRAVENOUS

## 2021-06-25 MED ORDER — HEPARIN SOD (PORK) LOCK FLUSH 100 UNIT/ML IV SOLN
INTRAVENOUS | Status: AC
  Filled 2021-06-25: qty 5

## 2021-06-26 ENCOUNTER — Telehealth: Payer: Commercial Managed Care - PPO | Admitting: Plastic Surgery

## 2021-06-26 ENCOUNTER — Telehealth: Payer: Self-pay

## 2021-06-26 NOTE — Telephone Encounter (Signed)
Return call to pt, left VM for her to call back at her convenience to have further discussion of yesterday's appointment, results, and new meds.  ?

## 2021-06-29 ENCOUNTER — Encounter: Payer: Self-pay | Admitting: *Deleted

## 2021-06-30 ENCOUNTER — Other Ambulatory Visit: Payer: Self-pay | Admitting: Adult Health

## 2021-07-01 MED FILL — Dexamethasone Sodium Phosphate Inj 100 MG/10ML: INTRAMUSCULAR | Qty: 1 | Status: AC

## 2021-07-01 NOTE — Progress Notes (Signed)
? ?Patient Care Team: ?[facility] as PCP - General ?Mauro Kaufmann, RN as Oncology Nurse Navigator ?Rockwell Germany, RN as Oncology Nurse Navigator ?Nicholas Lose, MD as Consulting Physician (Hematology and Oncology) ?Coralie Keens, MD as Consulting Physician (General Surgery) ?Eppie Gibson, MD as Attending Physician (Radiation Oncology) ? ?DIAGNOSIS:  ?Encounter Diagnosis  ?Name Primary?  ? Malignant neoplasm of upper-outer quadrant of right breast in female, estrogen receptor negative ([place])   ? ? ?SUMMARY OF ONCOLOGIC HISTORY: ?Oncology History  ?Malignant neoplasm of upper-outer quadrant of right breast in female, estrogen receptor negative ([place])  ?04/08/2021 Initial Diagnosis  ? History of right breast eczematous change to the nipple, patient had palpable lump in the right breast her mammogram 2.1 cm spiculated mass with extensive calcifications that span 11 cm.  Ultrasound revealed 3 cm mass at 10:00 plus small satellite lesions.  Biopsy revealed grade 3 IDC ER 0%, PR 0%, HER2 3+ positive, Ki-67 30% ?  ?04/08/2021 Cancer Staging  ? Staging form: Breast, AJCC 8th Edition ?- Clinical stage from 04/08/2021: Stage IIB (cT2, cN1, cM0, G3, ER-, PR-, HER2+) - Signed by Nicholas Lose, MD on 04/15/2021 ?Stage prefix: Initial diagnosis ?Method of lymph node assessment: Axillary lymph node dissection ?Histologic grading system: 3 grade system ? ?  ?05/01/2021 -  Chemotherapy  ? Patient is on Treatment Plan : BREAST  Docetaxel + Carboplatin + Trastuzumab + Pertuzumab  (TCHP) q21d   ?   ?05/21/2021 Genetic Testing  ? Negative hereditary cancer genetic testing: no pathogenic variants detected in [facility] CustomNext-Cancer +RNAinsight Panel.  Report date is 05/21/2021.  ? ?The CustomNext-Cancer+RNAinsight panel offered by [facility] includes sequencing and rearrangement analysis for the following 47 genes:  APC, ATM, AXIN2, BARD1, BMPR1A, BRCA1, BRCA2, BRIP1, CDH1, CDK4, CDKN2A, CHEK2, DICER1, EPCAM, GREM1,  HOXB13, MEN1, MLH1, MSH2, MSH3, MSH6, MUTYH, NBN, NF1, NF2, NTHL1, PALB2, PMS2, POLD1, POLE, PTEN, RAD51C, RAD51D, RECQL, RET, SDHA, SDHAF2, SDHB, SDHC, SDHD, SMAD4, SMARCA4, STK11, TP53, TSC1, TSC2, and VHL.  RNA data is routinely analyzed for use in variant interpretation for all genes. ?  ? ? ?CHIEF COMPLIANT: Follow-up of right breast cancer cycle 4 neoadjuvant ?  ? ?INTERVAL HISTORY: Tara White is a 31 y.o. with above-mentioned history of right breast cancer. She presents to the clinic today for follow-up. She denies nausea and some diarrhea. She states that the ice helped with the mouth sores. Complains of pain in her left arm believe it comes from the blood clot.  She is extremely sore in the left arm and the arm swelling has improved but it still present. ? ? ?ALLERGIES:  has No Known Allergies. ? ?MEDICATIONS:  ?Current Outpatient Medications  ?Medication Sig Dispense Refill  ? rivaroxaban (XARELTO) 20 MG TABS tablet Take 1 tablet (20 mg total) by mouth daily with supper. 30 tablet 6  ? chlorpheniramine-HYDROcodone 10-8 MG/5ML hydrocodone 10 mg-chlorpheniramine 8 mg/5 mL oral susp extend.rel 12hr ? Take 5 mL every 12 hours by oral route as needed.    ? clindamycin (CLINDAGEL) 1 % gel Apply topically 2 (two) times daily. 30 g 0  ? Homeopathic Products (FRANKINCENSE UPLIFTING) OIL Apply 2 drops topically.    ? lidocaine-prilocaine (EMLA) cream Apply to affected area once 30 g 3  ? LORazepam (ATIVAN) 0.5 MG tablet Take 1 tablet (0.5 mg total) by mouth at bedtime as needed (Nausea or vomiting). 30 tablet 0  ? magic mouthwash w/lidocaine SOLN Take 5 mLs by mouth 4 (four) times daily as  needed for mouth pain. 240 mL 1  ? ondansetron (ZOFRAN) 8 MG tablet Take 1 tablet (8 mg total) by mouth 2 (two) times daily as needed (Nausea or vomiting). Start on the third day after chemotherapy. 30 tablet 1  ? prochlorperazine (COMPAZINE) 10 MG tablet Take 1 tablet (10 mg total) by mouth every 6 (six) hours as needed  (Nausea or vomiting). 30 tablet 1  ? ?No current facility-administered medications for this visit.  ? ? ?PHYSICAL EXAMINATION: ?ECOG PERFORMANCE STATUS: 1 - Symptomatic but completely ambulatory ? ?Vitals:  ? 07/02/21 0947  ?BP: (!) 151/86  ?Pulse: 78  ?Resp: 18  ?Temp: (!) 97.2 ?F (36.2 ?C)  ?SpO2: 100%  ? ?Filed Weights  ? 07/02/21 0947  ?Weight: 158 lb 9.6 oz (71.9 kg)  ? ?  ? ?LABORATORY DATA:  ?I have reviewed the data as listed ? ?  Latest Ref Rng & Units 06/12/2021  ?  9:19 AM 05/22/2021  ? 10:28 AM 05/08/2021  ?  9:21 AM  ?CMP  ?Glucose 70 - 99 mg/dL 104   84   93    ?BUN 6 - 20 mg/dL _0 ?Creatinine 0.44 - 1.00 mg/dL 0.91   0.88   0.84    ?Sodium 135 - 145 mmol/L 140   139   137    ?Potassium 3.5 - 5.1 mmol/L 3.6   3.3   3.9    ?Chloride 98 - 111 mmol/L 107   105   106    ?CO2 22 - 32 mmol/L _1 ?Calcium 8.9 - 10.3 mg/dL 9.3   9.3   9.0    ?Total Protein 6.5 - 8.1 g/dL 7.2   6.9   6.9    ?Total Bilirubin 0.3 - 1.2 mg/dL 0.4   0.4   0.3    ?Alkaline Phos 38 - 126 U/L 87   82   57    ?AST 15 - 41 U/L _2 ?ALT 0 - 44 U/L 34   32   19    ? ? ?Lab Results  ?Component Value Date  ? WBC 9.8 07/02/2021  ? HGB 11.1 (L) 07/02/2021  ? HCT 31.7 (L) 07/02/2021  ? MCV 90.1 07/02/2021  ? PLT 173 07/02/2021  ? NEUTROABS 6.6 07/02/2021  ? ? ?ASSESSMENT & PLAN:  ?Malignant neoplasm of upper-outer quadrant of right breast in female, estrogen receptor negative ([place]) ?04/08/2021:History of right breast eczematous change to the nipple, patient had palpable lump in the right breast her mammogram 2.1 cm spiculated mass with extensive calcifications that span 11 cm.  Ultrasound revealed 3 cm mass at 10:00 plus small satellite lesions.  Biopsy revealed grade 3 IDC ER 0%, PR 0%, HER2 3+ positive, Ki-67 30% ?  ?Recommendation: ?1. Neoadjuvant chemotherapy with [place] Perjeta ?6 cycles followed by Herceptin Perjeta maintenance versus Kadcyla maintenance (based on response to neoadjuvant chemo) for 1  year ?2. Followed by mastectomy with reconstruction ?3. Followed by adjuvant radiation therapy   ?  ?Breast MRI 04/27/2021: Extensive non-mass enhancement throughout all quadrants of the breast largest confluent area 2.9 cm, right nipple enhancement consistent with DCIS, 1.9 cm right axillary lymph node, left breast negative ?----------------------------------------------------------------------------------------------------------------------------------------------- ?CT CAP and bone scan: To my review there does not appear to be any evidence of metastatic disease. ?Current treatment: Cycle 4 of neoadjuvant TCHP ? ? Chemo  Toxicities: ?Fatigue ?Multiple sores: Resolved ?Initially constipation but 1 episode of diarrhea ?Vaginal dryness: Using Replens.  But still having issues. ?Acneform rash on the face: on topical clindamycin. ? 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 ?The duration of anticoagulation would be until the port remains in place. ? ?Return to clinic in 3 weeks for cycle 5. ? ?No orders of the defined types were placed in this encounter. ? ?The patient has a good understanding of the overall plan. she agrees with it. she will call with any problems that may develop before the next visit here. ?Total time spent: 30 mins including face to face time and time spent for planning, charting and co-ordination of care ? ? Harriette Ohara, MD ?07/02/21 ? ? ? I Gardiner Coins am scribing for Dr. Lindi Adie ? ?I have reviewed the above documentation for accuracy and completeness, and I agree with the above. ?  ?

## 2021-07-02 ENCOUNTER — Inpatient Hospital Stay: Payer: Commercial Managed Care - PPO

## 2021-07-02 ENCOUNTER — Inpatient Hospital Stay: Payer: Commercial Managed Care - PPO | Attending: Hematology and Oncology

## 2021-07-02 ENCOUNTER — Inpatient Hospital Stay (HOSPITAL_BASED_OUTPATIENT_CLINIC_OR_DEPARTMENT_OTHER): Payer: Commercial Managed Care - PPO | Admitting: Hematology and Oncology

## 2021-07-02 ENCOUNTER — Other Ambulatory Visit: Payer: Self-pay

## 2021-07-02 DIAGNOSIS — Z79899 Other long term (current) drug therapy: Secondary | ICD-10-CM | POA: Diagnosis not present

## 2021-07-02 DIAGNOSIS — Z171 Estrogen receptor negative status [ER-]: Secondary | ICD-10-CM | POA: Diagnosis not present

## 2021-07-02 DIAGNOSIS — Z5111 Encounter for antineoplastic chemotherapy: Secondary | ICD-10-CM | POA: Diagnosis present

## 2021-07-02 DIAGNOSIS — C50411 Malignant neoplasm of upper-outer quadrant of right female breast: Secondary | ICD-10-CM

## 2021-07-02 DIAGNOSIS — Z95828 Presence of other vascular implants and grafts: Secondary | ICD-10-CM

## 2021-07-02 LAB — CBC WITH DIFFERENTIAL (CANCER CENTER ONLY)
Abs Immature Granulocytes: 0.06 10*3/uL (ref 0.00–0.07)
Basophils Absolute: 0 10*3/uL (ref 0.0–0.1)
Basophils Relative: 0 %
Eosinophils Absolute: 0 10*3/uL (ref 0.0–0.5)
Eosinophils Relative: 0 %
HCT: 31.7 % — ABNORMAL LOW (ref 36.0–46.0)
Hemoglobin: 11.1 g/dL — ABNORMAL LOW (ref 12.0–15.0)
Immature Granulocytes: 1 %
Lymphocytes Relative: 23 %
Lymphs Abs: 2.3 10*3/uL (ref 0.7–4.0)
MCH: 31.5 pg (ref 26.0–34.0)
MCHC: 35 g/dL (ref 30.0–36.0)
MCV: 90.1 fL (ref 80.0–100.0)
Monocytes Absolute: 0.8 10*3/uL (ref 0.1–1.0)
Monocytes Relative: 8 %
Neutro Abs: 6.6 10*3/uL (ref 1.7–7.7)
Neutrophils Relative %: 68 %
Platelet Count: 173 10*3/uL (ref 150–400)
RBC: 3.52 MIL/uL — ABNORMAL LOW (ref 3.87–5.11)
RDW: 16 % — ABNORMAL HIGH (ref 11.5–15.5)
WBC Count: 9.8 10*3/uL (ref 4.0–10.5)
nRBC: 0 % (ref 0.0–0.2)

## 2021-07-02 LAB — PREGNANCY, URINE: Preg Test, Ur: NEGATIVE

## 2021-07-02 LAB — CMP (CANCER CENTER ONLY)
ALT: 40 U/L (ref 0–44)
AST: 25 U/L (ref 15–41)
Albumin: 4.3 g/dL (ref 3.5–5.0)
Alkaline Phosphatase: 88 U/L (ref 38–126)
Anion gap: 7 (ref 5–15)
BUN: 18 mg/dL (ref 6–20)
CO2: 27 mmol/L (ref 22–32)
Calcium: 9.6 mg/dL (ref 8.9–10.3)
Chloride: 106 mmol/L (ref 98–111)
Creatinine: 0.76 mg/dL (ref 0.44–1.00)
GFR, Estimated: >60 mL/min (ref >60)
Glucose, Bld: 80 mg/dL (ref 70–99)
Potassium: 3.5 mmol/L (ref 3.5–5.1)
Sodium: 140 mmol/L (ref 135–145)
Total Bilirubin: 0.4 mg/dL (ref 0.3–1.2)
Total Protein: 7.3 g/dL (ref 6.5–8.1)

## 2021-07-02 MED ORDER — SODIUM CHLORIDE 0.9 % IV SOLN
420.0000 mg | Freq: Once | INTRAVENOUS | Status: AC
  Administered 2021-07-02: 420 mg via INTRAVENOUS
  Filled 2021-07-02: qty 14

## 2021-07-02 MED ORDER — ACETAMINOPHEN 325 MG PO TABS
650.0000 mg | ORAL_TABLET | Freq: Once | ORAL | Status: AC
  Administered 2021-07-02: 650 mg via ORAL
  Filled 2021-07-02: qty 2

## 2021-07-02 MED ORDER — HEPARIN SOD (PORK) LOCK FLUSH 100 UNIT/ML IV SOLN
500.0000 [IU] | Freq: Once | INTRAVENOUS | Status: AC | PRN
  Administered 2021-07-02: 500 [IU]

## 2021-07-02 MED ORDER — SODIUM CHLORIDE 0.9 % IV SOLN: Freq: Once | INTRAVENOUS | Status: AC

## 2021-07-02 MED ORDER — SODIUM CHLORIDE 0.9 % IV SOLN
75.0000 mg/m2 | Freq: Once | INTRAVENOUS | Status: AC
  Administered 2021-07-02: 140 mg via INTRAVENOUS
  Filled 2021-07-02: qty 14

## 2021-07-02 MED ORDER — SODIUM CHLORIDE 0.9 % IV SOLN
150.0000 mg | Freq: Once | INTRAVENOUS | Status: AC
  Administered 2021-07-02: 150 mg via INTRAVENOUS
  Filled 2021-07-02: qty 150

## 2021-07-02 MED ORDER — SODIUM CHLORIDE 0.9% FLUSH
10.0000 mL | INTRAVENOUS | Status: DC | PRN
  Administered 2021-07-02: 10 mL

## 2021-07-02 MED ORDER — DIPHENHYDRAMINE HCL 25 MG PO CAPS
50.0000 mg | ORAL_CAPSULE | Freq: Once | ORAL | Status: AC
  Administered 2021-07-02: 50 mg via ORAL
  Filled 2021-07-02: qty 2

## 2021-07-02 MED ORDER — SODIUM CHLORIDE 0.9 % IV SOLN
740.0000 mg | Freq: Once | INTRAVENOUS | Status: AC
  Administered 2021-07-02: 740 mg via INTRAVENOUS
  Filled 2021-07-02: qty 74

## 2021-07-02 MED ORDER — RIVAROXABAN 20 MG PO TABS: 20.0000 mg | ORAL_TABLET | Freq: Every day | ORAL | 6 refills | Status: DC

## 2021-07-02 MED ORDER — TRASTUZUMAB-ANNS CHEMO 150 MG IV SOLR
6.0000 mg/kg | Freq: Once | INTRAVENOUS | Status: AC
  Administered 2021-07-02: 420 mg via INTRAVENOUS
  Filled 2021-07-02: qty 20

## 2021-07-02 MED ORDER — PALONOSETRON HCL INJECTION 0.25 MG/5ML
0.2500 mg | Freq: Once | INTRAVENOUS | Status: AC
  Administered 2021-07-02: 0.25 mg via INTRAVENOUS
  Filled 2021-07-02: qty 5

## 2021-07-02 MED ORDER — SODIUM CHLORIDE 0.9% FLUSH
10.0000 mL | INTRAVENOUS | Status: DC | PRN
  Administered 2021-07-02: 10 mL via INTRAVENOUS

## 2021-07-02 NOTE — Assessment & Plan Note (Signed)
04/08/2021:History of right breast eczematous change to the nipple, patient had palpable lump in the right breast her mammogram 2.1 cm spiculated mass with extensive calcifications that span 11 cm. ?Ultrasound revealed 3 cm mass at 10:00 plus small satellite lesions. ?Biopsy revealed grade 3 IDC ER 0%, PR 0%, HER2 3+ positive, Ki-67 30% ?? ?Recommendation: ?1. Neoadjuvant chemotherapy with [place] Perjeta ?6 cycles followed by Herceptin Perjeta maintenance versus Kadcyla maintenance (based on response to neoadjuvant chemo) for 1 year ?2. Followed by?mastectomy with reconstruction ?3. Followed by adjuvant radiation therapy?? ?? ?Breast MRI 04/27/2021: Extensive non-mass enhancement throughout all quadrants of the breast largest confluent area 2.9 cm, right nipple enhancement consistent with DCIS, 1.9 cm right axillary lymph node, left breast negative ?----------------------------------------------------------------------------------------------------------------------------------------------- ?CT CAP and bone scan:?To my review there does not appear to be any evidence of metastatic disease. ?Current treatment: Cycle 4 of neoadjuvant TCHP ? ??Chemo Toxicities: ?1. Fatigue ?2. Multiple sores:?Resolved ?3. Initially constipation but 1 episode of diarrhea ?4. Vaginal dryness: Using Replens.  But still having issues. ?Acneform rash on the face: I sent a prescription for topical clindamycin. ? 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 ?

## 2021-07-02 NOTE — Patient Instructions (Signed)
[facility]ICAL [facility]COLOG[facility]  Discharge Instructions: Thank you for choosing [facility] to provide your oncology and hematology care.   If you have a lab appointment with the Cancer Center, please go directly to the Cancer Center and check in at the registration area.   Wear comfortable clothing and clothing appropriate for easy access to any Portacath or PICC line.   We strive to give you quality time with your provider. You may need to reschedule your appointment if you arrive late (15 or more minutes).  Arriving late affects you and other patients whose appointments are after yours.  Also, if you miss three or more appointments without notifying the office, you may be dismissed from the clinic at the provider's discretion.      For prescription refill requests, have your pharmacy contact our office and allow 72 hours for refills to be completed.    Today you received the following chemotherapy and/or immunotherapy agents: Trastuzumab, Pertuzumab, Docetaxel, and Carboplatin      To help prevent nausea and vomiting after your treatment, we encourage you to take your nausea medication as directed.  BELOW ARE SYMPTOMS THAT SHOULD BE REPORTED IMMEDIATELY: *FEVER GREATER THAN 100.4 F (38 C) OR HIGHER *CHILLS OR SWEATING *NAUSEA AND VOMITING THAT IS NOT CONTROLLED WITH YOUR NAUSEA MEDICATION *UNUSUAL SHORTNESS OF BREATH *UNUSUAL BRUISING OR BLEEDING *URINARY PROBLEMS (pain or burning when urinating, or frequent urination) *BOWEL PROBLEMS (unusual diarrhea, constipation, pain near the anus) TENDERNESS IN MOUTH AND THROAT WITH OR WITHOUT PRESENCE OF ULCERS (sore throat, sores in mouth, or a toothache) UNUSUAL RASH, SWELLING OR PAIN  UNUSUAL VAGINAL DISCHARGE OR ITCHING   Items with * indicate a potential emergency and should be followed up as soon as possible or go to the Emergency Department if any problems should occur.  Please show the CHEMOTHERAPY ALERT CARD  or IMMUNOTHERAPY ALERT CARD at check-in to the Emergency Department and triage nurse.  Should you have questions after your visit or need to cancel or reschedule your appointment, please contact [facility]  Dept: [phone]  and follow the prompts.  Office hours are 8:00 a.m. to 4:30 p.m. Monday - Friday. Please note that voicemails left after 4:00 p.m. may not be returned until the following business day.  We are closed weekends and major holidays. You have access to a nurse at all times for urgent questions. Please call the main number to the clinic Dept: [phone] and follow the prompts.   For any non-urgent questions, you may also contact your provider using MyChart. We now offer e-Visits for anyone 18 and older to request care online for non-urgent symptoms. For details visit [email or web address].   Also download the MyChart app! Go to the app store, search "MyChart", open the app, select [facility], and log in with your MyChart username and password.  Due to Covid, a mask is required upon entering the hospital/clinic. If you do not have a mask, one will be given to you upon arrival. For doctor visits, patients may have 1 support person aged 18 or older with them. For treatment visits, patients cannot have anyone with them due to current Covid guidelines and our immunocompromised population.  

## 2021-07-04 ENCOUNTER — Inpatient Hospital Stay: Payer: Commercial Managed Care - PPO

## 2021-07-04 ENCOUNTER — Other Ambulatory Visit: Payer: Self-pay

## 2021-07-04 VITALS — BP 126/85 | HR 91 | Temp 97.7°F | Resp 16

## 2021-07-04 DIAGNOSIS — C50411 Malignant neoplasm of upper-outer quadrant of right female breast: Secondary | ICD-10-CM | POA: Diagnosis not present

## 2021-07-04 MED ORDER — PEGFILGRASTIM-BMEZ 6 MG/0.6ML ~~LOC~~ SOSY
6.0000 mg | PREFILLED_SYRINGE | Freq: Once | SUBCUTANEOUS | Status: AC
  Administered 2021-07-04: 6 mg via SUBCUTANEOUS
  Filled 2021-07-04: qty 0.6

## 2021-07-04 NOTE — Patient Instructions (Signed)

## 2021-07-07 ENCOUNTER — Telehealth: Payer: Self-pay | Admitting: *Deleted

## 2021-07-07 NOTE — Telephone Encounter (Signed)
Received call from pt with complaint of UTI symptoms including burning with urination and bladder fullness.  Pt states she started Cipro 500 mg p.o BID on 07/04/21 with no relief in symptoms.  MD currently out of the office, pt educated to contact PCP or GYN for further evaluation and treatment.  Pt verbalized understanding.  ?

## 2021-07-10 NOTE — Progress Notes (Signed)
? ?Patient Care Team: ?[facility] as PCP - General ?Mauro Kaufmann, RN as Oncology Nurse Navigator ?Rockwell Germany, RN as Oncology Nurse Navigator ?Nicholas Lose, MD as Consulting Physician (Hematology and Oncology) ?Coralie Keens, MD as Consulting Physician (General Surgery) ?Eppie Gibson, MD as Attending Physician (Radiation Oncology) ? ?DIAGNOSIS:  ?Encounter Diagnosis  ?Name Primary?  ? Malignant neoplasm of upper-outer quadrant of right breast in female, estrogen receptor negative ([place])   ? ? ?SUMMARY OF ONCOLOGIC HISTORY: ?Oncology History  ?Malignant neoplasm of upper-outer quadrant of right breast in female, estrogen receptor negative ([place])  ?04/08/2021 Initial Diagnosis  ? History of right breast eczematous change to the nipple, patient had palpable lump in the right breast her mammogram 2.1 cm spiculated mass with extensive calcifications that span 11 cm.  Ultrasound revealed 3 cm mass at 10:00 plus small satellite lesions.  Biopsy revealed grade 3 IDC ER 0%, PR 0%, HER2 3+ positive, Ki-67 30% ?  ?04/08/2021 Cancer Staging  ? Staging form: Breast, AJCC 8th Edition ?- Clinical stage from 04/08/2021: Stage IIB (cT2, cN1, cM0, G3, ER-, PR-, HER2+) - Signed by Nicholas Lose, MD on 04/15/2021 ?Stage prefix: Initial diagnosis ?Method of lymph node assessment: Axillary lymph node dissection ?Histologic grading system: 3 grade system ? ?  ?05/01/2021 -  Chemotherapy  ? Patient is on Treatment Plan : BREAST  Docetaxel + Carboplatin + Trastuzumab + Pertuzumab  (TCHP) q21d   ? ?  ?  ?05/21/2021 Genetic Testing  ? Negative hereditary cancer genetic testing: no pathogenic variants detected in [facility] CustomNext-Cancer +RNAinsight Panel.  Report date is 05/21/2021.  ? ?The CustomNext-Cancer+RNAinsight panel offered by [facility] includes sequencing and rearrangement analysis for the following 47 genes:  APC, ATM, AXIN2, BARD1, BMPR1A, BRCA1, BRCA2, BRIP1, CDH1, CDK4, CDKN2A, CHEK2, DICER1, EPCAM, GREM1,  HOXB13, MEN1, MLH1, MSH2, MSH3, MSH6, MUTYH, NBN, NF1, NF2, NTHL1, PALB2, PMS2, POLD1, POLE, PTEN, RAD51C, RAD51D, RECQL, RET, SDHA, SDHAF2, SDHB, SDHC, SDHD, SMAD4, SMARCA4, STK11, TP53, TSC1, TSC2, and VHL.  RNA data is routinely analyzed for use in variant interpretation for all genes. ?  ? ? ?CHIEF COMPLIANT: Follow-up of right breast cancer herceptin maintenance ? ?INTERVAL HISTORY: Tara White is a  31 y.o. with above-mentioned history of right breast cancer. She presents to the clinic today for follow-up. She state that she had a stomach bug and a cold. She states her eyes started watering without stop. She stated it started when she started taking the blood thinner. She did complain of nausea and the rash still appear on the face. She state that she had some diarrhea.  She had a stomach bug which caused significant diarrhea for her.  She had leg swelling for which she took Lasix. ? ? ?ALLERGIES:  has No Known Allergies. ? ?MEDICATIONS:  ?Current Outpatient Medications  ?Medication Sig Dispense Refill  ? chlorpheniramine-HYDROcodone 10-8 MG/5ML hydrocodone 10 mg-chlorpheniramine 8 mg/5 mL oral susp extend.rel 12hr ? Take 5 mL every 12 hours by oral route as needed.    ? clindamycin (CLINDAGEL) 1 % gel Apply topically 2 (two) times daily. 30 g 0  ? Homeopathic Products (FRANKINCENSE UPLIFTING) OIL Apply 2 drops topically.    ? levofloxacin (LEVAQUIN) 500 MG tablet Take 1 tablet (500 mg total) by mouth daily. 7 tablet 0  ? lidocaine-prilocaine (EMLA) cream Apply to affected area once 30 g 3  ? LORazepam (ATIVAN) 0.5 MG tablet Take 1 tablet (0.5 mg total) by mouth at bedtime as needed (Nausea or  vomiting). 30 tablet 0  ? magic mouthwash w/lidocaine SOLN Take 5 mLs by mouth 4 (four) times daily as needed for mouth pain. 240 mL 1  ? ondansetron (ZOFRAN) 8 MG tablet Take 1 tablet (8 mg total) by mouth 2 (two) times daily as needed (Nausea or vomiting). Start on the third day after chemotherapy. 30 tablet 1   ? potassium chloride SA (KLOR-CON M) 20 MEQ tablet Take 1 tablet (20 mEq total) by mouth daily. 7 tablet 0  ? PROBIOTIC PRODUCT PO Take by mouth.    ? prochlorperazine (COMPAZINE) 10 MG tablet Take 1 tablet (10 mg total) by mouth every 6 (six) hours as needed (Nausea or vomiting). 30 tablet 1  ? rivaroxaban (XARELTO) 20 MG TABS tablet Take 1 tablet (20 mg total) by mouth daily with supper. 30 tablet 6  ? ?No current facility-administered medications for this visit.  ? ? ?PHYSICAL EXAMINATION: ?ECOG PERFORMANCE STATUS: 1 - Symptomatic but completely ambulatory ? ?Vitals:  ? 07/24/21 0840  ?BP: 125/83  ?Pulse: 85  ?Resp: 16  ?Temp: 97.9 ?F (36.6 ?C)  ?SpO2: 100%  ? ?Filed Weights  ? 07/24/21 0840  ?Weight: 161 lb 14.4 oz (73.4 kg)  ?  ? ?LABORATORY DATA:  ?I have reviewed the data as listed ? ?  Latest Ref Rng & Units 07/17/2021  ? 11:03 AM 07/02/2021  ?  9:41 AM 06/12/2021  ?  9:19 AM  ?CMP  ?Glucose 70 - 99 mg/dL 94   80   104    ?BUN 6 - 20 mg/dL _0 ?Creatinine 0.44 - 1.00 mg/dL 0.87   0.76   0.91    ?Sodium 135 - 145 mmol/L 139   140   140    ?Potassium 3.5 - 5.1 mmol/L 3.9   3.5   3.6    ?Chloride 98 - 111 mmol/L 107   106   107    ?CO2 22 - 32 mmol/L _1 ?Calcium 8.9 - 10.3 mg/dL 8.7   9.6   9.3    ?Total Protein 6.5 - 8.1 g/dL 6.3   7.3   7.2    ?Total Bilirubin 0.3 - 1.2 mg/dL 0.3   0.4   0.4    ?Alkaline Phos 38 - 126 U/L 90   88   87    ?AST 15 - 41 U/L 41   25   21    ?ALT 0 - 44 U/L 64   40   34    ? ? ?Lab Results  ?Component Value Date  ? WBC 7.1 07/24/2021  ? HGB 9.9 (L) 07/24/2021  ? HCT 28.7 (L) 07/24/2021  ? MCV 93.5 07/24/2021  ? PLT 134 (L) 07/24/2021  ? NEUTROABS 5.5 07/24/2021  ? ? ?ASSESSMENT & PLAN:  ?Malignant neoplasm of upper-outer quadrant of right breast in female, estrogen receptor negative ([place]) ?04/08/2021:History of right breast eczematous change to the nipple, patient had palpable lump in the right breast her mammogram 2.1 cm spiculated mass with extensive  calcifications that span 11 cm.  Ultrasound revealed 3 cm mass at 10:00 plus small satellite lesions.  Biopsy revealed grade 3 IDC ER 0%, PR 0%, HER2 3+ positive, Ki-67 30% ?  ?Recommendation: ?1. Neoadjuvant chemotherapy with [place] Perjeta ?6 cycles followed by Herceptin Perjeta maintenance versus Kadcyla maintenance (based on response to neoadjuvant chemo) for 1 year ?2. Followed by mastectomy with reconstruction ?  3. Followed by adjuvant radiation therapy   ?  ?Breast MRI 04/27/2021: Extensive non-mass enhancement throughout all quadrants of the breast largest confluent area 2.9 cm, right nipple enhancement consistent with DCIS, 1.9 cm right axillary lymph node, left breast negative ?----------------------------------------------------------------------------------------------------------------------------------------------- ?CT CAP and bone scan: To my review there does not appear to be any evidence of metastatic disease. ?Current treatment: Cycle 5 of neoadjuvant TCHP ?  ? Chemo Toxicities: ?Fatigue ?Multiple sores: Resolved ?Diarrhea ?Vaginal dryness: Using Replens.  But still having issues. ?  ?  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 ?  ?She inquired about the IEP flap reconstruction.  I provided her with the contact information for Dr. Veda Canning at [facility]. ? ?Return to clinic in 3 weeks for cycle 6. ?I sent a message to Dr. Ninfa Linden to see her after cycle 6 and after the breast MRI.  Breast MRI had been ordered for 08/13/2021. ? ? ? ?No orders of the defined types were placed in this encounter. ? ?The patient has a good understanding of the overall plan. she agrees with it. she will call with any problems that may develop before the next visit here. ?Total time spent: 30 mins including face to face time and time spent for planning, charting and co-ordination of care ? ? Harriette Ohara, MD ?07/24/21 ? ? ? I Gardiner Coins am  scribing for Dr. Lindi Adie ? ?I have reviewed the above documentation for accuracy and completeness, and I agree with the above. ?. ?

## 2021-07-16 ENCOUNTER — Encounter: Payer: Self-pay | Admitting: Hematology and Oncology

## 2021-07-17 ENCOUNTER — Ambulatory Visit (HOSPITAL_BASED_OUTPATIENT_CLINIC_OR_DEPARTMENT_OTHER)
Admission: RE | Admit: 2021-07-17 | Discharge: 2021-07-17 | Disposition: A | Discharge status: Home / Self Care | Payer: Commercial Managed Care - PPO | Source: Ambulatory Visit | Attending: Adult Health | Admitting: Adult Health

## 2021-07-17 ENCOUNTER — Other Ambulatory Visit: Payer: Self-pay | Admitting: *Deleted

## 2021-07-17 ENCOUNTER — Inpatient Hospital Stay: Payer: Commercial Managed Care - PPO

## 2021-07-17 ENCOUNTER — Inpatient Hospital Stay (HOSPITAL_BASED_OUTPATIENT_CLINIC_OR_DEPARTMENT_OTHER): Payer: Commercial Managed Care - PPO | Admitting: Adult Health

## 2021-07-17 ENCOUNTER — Telehealth: Payer: Self-pay

## 2021-07-17 ENCOUNTER — Encounter: Payer: Self-pay | Admitting: *Deleted

## 2021-07-17 ENCOUNTER — Other Ambulatory Visit: Payer: Self-pay

## 2021-07-17 ENCOUNTER — Encounter: Payer: Self-pay | Admitting: Adult Health

## 2021-07-17 VITALS — BP 117/77 | HR 75 | Temp 97.7°F | Resp 18 | Wt 163.4 lb

## 2021-07-17 DIAGNOSIS — C50411 Malignant neoplasm of upper-outer quadrant of right female breast: Secondary | ICD-10-CM

## 2021-07-17 DIAGNOSIS — Z95828 Presence of other vascular implants and grafts: Secondary | ICD-10-CM | POA: Insufficient documentation

## 2021-07-17 DIAGNOSIS — Z171 Estrogen receptor negative status [ER-]: Secondary | ICD-10-CM | POA: Insufficient documentation

## 2021-07-17 DIAGNOSIS — M7989 Other specified soft tissue disorders: Secondary | ICD-10-CM | POA: Diagnosis not present

## 2021-07-17 LAB — CBC WITH DIFFERENTIAL (CANCER CENTER ONLY)
Abs Immature Granulocytes: 0.14 10*3/uL — ABNORMAL HIGH (ref 0.00–0.07)
Basophils Absolute: 0 10*3/uL (ref 0.0–0.1)
Basophils Relative: 0 %
Eosinophils Absolute: 0 10*3/uL (ref 0.0–0.5)
Eosinophils Relative: 0 %
HCT: 28.9 % — ABNORMAL LOW (ref 36.0–46.0)
Hemoglobin: 9.5 g/dL — ABNORMAL LOW (ref 12.0–15.0)
Immature Granulocytes: 1 %
Lymphocytes Relative: 16 %
Lymphs Abs: 1.8 10*3/uL (ref 0.7–4.0)
MCH: 31 pg (ref 26.0–34.0)
MCHC: 32.9 g/dL (ref 30.0–36.0)
MCV: 94.4 fL (ref 80.0–100.0)
Monocytes Absolute: 0.5 10*3/uL (ref 0.1–1.0)
Monocytes Relative: 5 %
Neutro Abs: 8.9 10*3/uL — ABNORMAL HIGH (ref 1.7–7.7)
Neutrophils Relative %: 78 %
Platelet Count: 123 10*3/uL — ABNORMAL LOW (ref 150–400)
RBC: 3.06 MIL/uL — ABNORMAL LOW (ref 3.87–5.11)
RDW: 17.3 % — ABNORMAL HIGH (ref 11.5–15.5)
WBC Count: 11.3 10*3/uL — ABNORMAL HIGH (ref 4.0–10.5)
nRBC: 0 % (ref 0.0–0.2)

## 2021-07-17 LAB — CMP (CANCER CENTER ONLY)
ALT: 64 U/L — ABNORMAL HIGH (ref 0–44)
AST: 41 U/L (ref 15–41)
Albumin: 3.8 g/dL (ref 3.5–5.0)
Alkaline Phosphatase: 90 U/L (ref 38–126)
Anion gap: 5 (ref 5–15)
BUN: 17 mg/dL (ref 6–20)
CO2: 27 mmol/L (ref 22–32)
Calcium: 8.7 mg/dL — ABNORMAL LOW (ref 8.9–10.3)
Chloride: 107 mmol/L (ref 98–111)
Creatinine: 0.87 mg/dL (ref 0.44–1.00)
GFR, Estimated: >60 mL/min (ref >60)
Glucose, Bld: 94 mg/dL (ref 70–99)
Potassium: 3.9 mmol/L (ref 3.5–5.1)
Sodium: 139 mmol/L (ref 135–145)
Total Bilirubin: 0.3 mg/dL (ref 0.3–1.2)
Total Protein: 6.3 g/dL — ABNORMAL LOW (ref 6.5–8.1)

## 2021-07-17 LAB — MAGNESIUM: Magnesium: 1.9 mg/dL (ref 1.7–2.4)

## 2021-07-17 LAB — BRAIN NATRIURETIC PEPTIDE: B Natriuretic Peptide: 50.8 pg/mL (ref 0.0–100.0)

## 2021-07-17 MED ORDER — POTASSIUM CHLORIDE CRYS ER 20 MEQ PO TBCR: 20.0000 meq | EXTENDED_RELEASE_TABLET | Freq: Every day | ORAL | 0 refills | Status: DC

## 2021-07-17 MED ORDER — SODIUM CHLORIDE 0.9% FLUSH
10.0000 mL | Freq: Once | INTRAVENOUS | Status: AC
  Administered 2021-07-17: 10 mL

## 2021-07-17 MED ORDER — FUROSEMIDE 20 MG PO TABS: 20.0000 mg | ORAL_TABLET | Freq: Every day | ORAL | 0 refills | Status: DC

## 2021-07-17 NOTE — Assessment & Plan Note (Signed)
04/08/2021:History of right breast eczematous change to the nipple, patient had palpable lump in the right breast her mammogram 2.1 cm spiculated mass with extensive calcifications that span 11 cm. ?Ultrasound revealed 3 cm mass at 10:00 plus small satellite lesions. ?Biopsy revealed grade 3 IDC ER 0%, PR 0%, HER2 3+ positive, Ki-67 30% ?? ?Recommendation: ?1. Neoadjuvant chemotherapy with TCH Perjeta ?6 cycles followed by Herceptin Perjeta maintenance versus Kadcyla maintenance (based on response to neoadjuvant chemo) for 1 year ?2. Followed by?mastectomy with reconstruction ?3. Followed by adjuvant radiation therapy?? ?? ?Breast MRI 04/27/2021: Extensive non-mass enhancement throughout all quadrants of the breast largest confluent area 2.9 cm, right nipple enhancement consistent with DCIS, 1.9 cm right axillary lymph node, left breast negative ?----------------------------------------------------------------------------------------------------------------------------------------------- ?CT CAP and bone scan:?To my review there does not appear to be any evidence of metastatic disease. ?Current treatment: Cycle 4 of neoadjuvant TCHP--2 weeks ago ? ?Tara White will undergo a left arm Doppler and right leg Doppler to evaluate whether or not the swelling is related to a blood clot.  Additionally she has gained about 5 pounds since her last visit so this could very well be related to generalized swelling.  We got a BNP today with her labs.  Her lab work was stable and did not reveal any etiology of the issues at this point.  Her BNP is pending. ? ?If the blood clot is worse then she will likely need to switch to Lovenox injections.  I reviewed this with her.  If there are no worsening of the left arm blood clot or new right leg blood clot we will give some Lasix. ? ?[name] returns next week for cycle 5 of treatment. ? ?? ? ?

## 2021-07-17 NOTE — Progress Notes (Signed)
[name]: ?  ? ?[facility], [facility] ?[address] ?[place] [address] ? ? ?DIAGNOSIS:  Cancer Staging  ?Malignant neoplasm of upper-outer quadrant of right breast in female, estrogen receptor negative (Tara White) ?Staging form: Breast, AJCC 8th Edition ?- Clinical stage from 04/08/2021: Stage IIB (cT2, cN1, cM0, G3, ER-, PR-, HER2+) - Signed by Nicholas Lose, MD on 04/15/2021 ?Stage prefix: Initial diagnosis ?Method of lymph node assessment: Axillary lymph node dissection ?Histologic grading system: 3 grade system ? ? ?SUMMARY OF ONCOLOGIC HISTORY: ?Oncology History  ?Malignant neoplasm of upper-outer quadrant of right breast in female, estrogen receptor negative (Tara White)  ?04/08/2021 Initial Diagnosis  ? History of right breast eczematous change to the nipple, patient had palpable lump in the right breast her mammogram 2.1 cm spiculated mass with extensive calcifications that span 11 cm.  Ultrasound revealed 3 cm mass at 10:00 plus small satellite lesions.  Biopsy revealed grade 3 IDC ER 0%, PR 0%, HER2 3+ positive, Ki-67 30% ?  ?04/08/2021 Cancer Staging  ? Staging form: Breast, AJCC 8th Edition ?- Clinical stage from 04/08/2021: Stage IIB (cT2, cN1, cM0, G3, ER-, PR-, HER2+) - Signed by Nicholas Lose, MD on 04/15/2021 ?Stage prefix: Initial diagnosis ?Method of lymph node assessment: Axillary lymph node dissection ?Histologic grading system: 3 grade system ? ?  ?05/01/2021 -  Chemotherapy  ? Patient is on Treatment Plan : BREAST  Docetaxel + Carboplatin + Trastuzumab + Pertuzumab  (TCHP) q21d   ? ?  ?  ?05/21/2021 Genetic Testing  ? Negative hereditary cancer genetic testing: no pathogenic variants detected in [facility] CustomNext-Cancer +RNAinsight Panel.  Report date is 05/21/2021.  ? ?The CustomNext-Cancer+RNAinsight panel offered by [facility] includes sequencing and rearrangement analysis for the following 47 genes:  APC, ATM, AXIN2, BARD1, BMPR1A, BRCA1, BRCA2, BRIP1, CDH1, CDK4,  CDKN2A, CHEK2, DICER1, EPCAM, GREM1, HOXB13, MEN1, MLH1, MSH2, MSH3, MSH6, MUTYH, NBN, NF1, NF2, NTHL1, PALB2, PMS2, POLD1, POLE, PTEN, RAD51C, RAD51D, RECQL, RET, SDHA, SDHAF2, SDHB, SDHC, SDHD, SMAD4, SMARCA4, STK11, TP53, TSC1, TSC2, and VHL.  RNA data is routinely analyzed for use in variant interpretation for all genes. ?  ? ? ?CURRENT THERAPY: TCHP, cycle 5 is due on July 23, 2021. ? ?INTERVAL HISTORY: ?Tara White 31 y.o. female returns for urgent evaluation of swelling.  She notes the swelling is more generalized and worse in right leg and left arm.  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  She notes that due to the swelling she feels more of a generalized weakness as well.   ? ?Patient Active Problem List  ? Diagnosis Date Noted  ? Port-A-Cath in place 07/17/2021  ? Family history of breast cancer 04/21/2021  ? Genetic testing 04/21/2021  ? Malignant neoplasm of upper-outer quadrant of right breast in female, estrogen receptor negative (Tara White 04/14/2021  ? Hypertension in pregnancy, preeclampsia, severe, delivered/postpartum 12/16/2015  ? Pre-eclampsia 12/15/2015  ? Postpartum care following cesarean delivery (9/13) 12/11/2015  ? Preeclampsia, severe 12/09/2015  ? ? ?has No Known Allergies. ? ?MEDICAL HISTORY: ?Past Medical History:  ?Diagnosis Date  ? Family history of breast cancer 04/21/2021  ? right breast ca 03/2021  ? ? ?SURGICAL HISTORY: ?Past Surgical History:  ?Procedure Laterality Date  ? CESAREAN SECTION N/A 12/10/2015  ? Procedure: CESAREAN SECTION;  Surgeon: KAloha Gell MD;  Location: [facility]  Service: Obstetrics;  Laterality: N/A;  ? IR CV LINE INJECTION  06/25/2021  ?  IR RADIOLOGY PERIPHERAL GUIDED IV START  06/25/2021  ? IR US GUIDE VASC ACCESS LEFT  06/25/2021  ? IR VENO/EXT/UNI LEFT  06/25/2021  ? PORTACATH PLACEMENT Left 04/30/2021  ? Procedure: INSERTION PORT-A-CATH;  Surgeon: Coralie Keens, MD;  Location: [facility];   Service: General;  Laterality: Left;  ? WISDOM TOOTH EXTRACTION    ? ? ?SOCIAL HISTORY: ?Social History  ? ?Socioeconomic History  ? Marital status: Married  ?  Spouse name: Not on file  ? Number of children: Not on file  ? Years of education: Not on file  ? Highest education level: Not on file  ?Occupational History  ? Not on file  ?Tobacco Use  ? Smoking status: Never  ? Smokeless tobacco: Never  ?Substance and Sexual Activity  ? Alcohol use: Not Currently  ?  Alcohol/week: 2.0 standard drinks  ?  Types: 2 Standard drinks or equivalent per week  ? Drug use: No  ? Sexual activity: Not Currently  ?  Partners: Male  ?  Birth control/protection: Pill  ?  Comment: Loestrin  ?Other Topics Concern  ? Not on file  ?Social History Narrative  ? Not on file  ? ?Social Determinants of Health  ? ?Financial Resource Strain: Not on file  ?Food Insecurity: Not on file  ?Transportation Needs: Not on file  ?Physical Activity: Not on file  ?Stress: Not on file  ?Social Connections: Not on file  ?Intimate Partner Violence: Not on file  ? ? ?FAMILY HISTORY: ?Family History  ?Problem Relation Age of Onset  ? Hypothyroidism Mother   ? Breast cancer Maternal Grandmother 68  ? Skin cancer Maternal Grandfather   ? Heart failure Paternal Grandfather   ? ? ?Review of Systems  ?Constitutional:  Positive for fatigue. Negative for appetite change, chills, fever and unexpected weight change.  ?HENT:   Negative for hearing loss, lump/mass and trouble swallowing.   ?Eyes:  Negative for eye problems and icterus.  ?Respiratory:  Negative for chest tightness, cough and shortness of breath.   ?Cardiovascular:  Positive for leg swelling. Negative for chest pain and palpitations.  ?Gastrointestinal:  Negative for abdominal distention, abdominal pain, constipation, diarrhea, nausea and vomiting.  ?Endocrine: Negative for hot flashes.  ?Genitourinary:  Negative for difficulty urinating.   ?Musculoskeletal:  Negative for arthralgias.  ?Skin:  Negative for  itching and rash.  ?Neurological:  Negative for dizziness, extremity weakness, headaches and numbness.  ?Hematological:  Negative for adenopathy. Does not bruise/bleed easily.  ?Psychiatric/Behavioral:  Negative for depression. The patient is not nervous/anxious.    ? ? ?PHYSICAL EXAMINATION ? ?ECOG PERFORMANCE STATUS: 1 - Symptomatic but completely ambulatory ? ?Vitals:  ? 07/17/21 1120  ?BP: 117/77  ?Pulse: 75  ?Resp: 18  ?Temp: 97.7 ?F (36.5 ?C)  ?SpO2: 100%  ? ? ?Physical Exam ?Constitutional:   ?   General: She is not in acute distress. ?   Appearance: Normal appearance. She is not toxic-appearing.  ?HENT:  ?   Head: Normocephalic and atraumatic.  ?Eyes:  ?   General: No scleral icterus. ?Cardiovascular:  ?   Rate and Rhythm: Normal rate and regular rhythm.  ?   Pulses: Normal pulses.  ?   Heart sounds: Normal heart sounds.  ?Pulmonary:  ?   Effort: Pulmonary effort is normal.  ?   Breath sounds: Normal breath sounds.  ?Abdominal:  ?   General: Abdomen is flat. Bowel sounds are normal. There is no distension.  ?   Palpations: Abdomen  is soft.  ?   Tenderness: There is no abdominal tenderness.  ?Musculoskeletal:     ?   General: No swelling.  ?   Cervical back: Neck supple.  ?   Comments: Left arm swelling and right leg swelling worse than other extremities.  ?Lymphadenopathy:  ?   Cervical: No cervical adenopathy.  ?Skin: ?   General: Skin is warm and dry.  ?   Findings: No rash.  ?Neurological:  ?   General: No focal deficit present.  ?   Mental Status: She is alert.  ?Psychiatric:     ?   Mood and Affect: Mood normal.     ?   Behavior: Behavior normal.  ? ? ?LABORATORY DATA: ? ?CBC ?   ?Component Value Date/Time  ? WBC 11.3 (H) 07/17/2021 1103  ? WBC 10.5 05/22/2021 1028  ? RBC 3.06 (L) 07/17/2021 1103  ? HGB 9.5 (L) 07/17/2021 1103  ? HGB 14.0 02/11/2015 1455  ? HCT 28.9 (L) 07/17/2021 1103  ? PLT 123 (L) 07/17/2021 1103  ? MCV 94.4 07/17/2021 1103  ? MCH 31.0 07/17/2021 1103  ? MCHC 32.9 07/17/2021 1103  ?  RDW 17.3 (H) 07/17/2021 1103  ? LYMPHSABS 1.8 07/17/2021 1103  ? MONOABS 0.5 07/17/2021 1103  ? EOSABS 0.0 07/17/2021 1103  ? BASOSABS 0.0 07/17/2021 1103  ? ? ?CMP  ?   ?Component Value Date/Time  ? NA 139

## 2021-07-17 NOTE — Telephone Encounter (Signed)
-----   Message from Gardenia Phlegm, NP sent at 07/17/2021  2:58 PM EDT ----- ?Please send in lasix '20mg'$  daily x 1 weeks with potassium 44mq daily  ? ?Disp (7) of each, no refills.  Patient knows that this is the plan. ? ?Thanks, LC ?----- Message ----- ?From: Interface, [employer] ?Sent: 07/17/2021   2:48 PM EDT ?To: LGardenia Phlegm NP ? ? ?

## 2021-07-17 NOTE — Progress Notes (Signed)
Left upper extremity and right lower extremity venous duplexes have been completed. ?Preliminary results can be found in CV Proc through chart review.  ?Results were given to  ? ?07/17/21 2:10 PM ?Carlos Levering RVT   ?

## 2021-07-17 NOTE — Telephone Encounter (Signed)
Called pt with results and sent in both Rx per DNP. Pt understands directions for medications and prefers [facility].  ?

## 2021-07-22 ENCOUNTER — Inpatient Hospital Stay: Payer: Commercial Managed Care - PPO | Admitting: Hematology and Oncology

## 2021-07-22 ENCOUNTER — Encounter: Payer: Self-pay | Admitting: Hematology and Oncology

## 2021-07-22 ENCOUNTER — Inpatient Hospital Stay: Payer: Commercial Managed Care - PPO

## 2021-07-23 NOTE — Assessment & Plan Note (Signed)
04/08/2021:History of right breast eczematous change to the nipple, patient had palpable lump in the right breast her mammogram 2.1 cm spiculated mass with extensive calcifications that span 11 cm. ?Ultrasound revealed 3 cm mass at 10:00 plus small satellite lesions. ?Biopsy revealed grade 3 IDC ER 0%, PR 0%, HER2 3+ positive, Ki-67 30% ?? ?Recommendation: ?1. Neoadjuvant chemotherapy with [place] Perjeta ?6 cycles followed by Herceptin Perjeta maintenance versus Kadcyla maintenance (based on response to neoadjuvant chemo) for 1 year ?2. Followed by?mastectomy with reconstruction ?3. Followed by adjuvant radiation therapy?? ?? ?Breast MRI 04/27/2021: Extensive non-mass enhancement throughout all quadrants of the breast largest confluent area 2.9 cm, right nipple enhancement consistent with DCIS, 1.9 cm right axillary lymph node, left breast negative ?----------------------------------------------------------------------------------------------------------------------------------------------- ?CT CAP and bone scan:?To my review there does not appear to be any evidence of metastatic disease. ?Current treatment: Cycle 5 of neoadjuvant TCHP ?? ??Chemo Toxicities: ?1. Fatigue ?2. Multiple sores:?Resolved ?3. Initially constipation but 1 episode of diarrhea ?4. Vaginal dryness:?Using?Replens.??But still having issues. ?Acneform rash on the face: on topical clindamycin. ?? 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 ?The duration of anticoagulation would be until the port remains in place. ?? ?Return to clinic in 3 weeks for cycle 6. ?

## 2021-07-24 ENCOUNTER — Other Ambulatory Visit: Payer: Self-pay

## 2021-07-24 ENCOUNTER — Inpatient Hospital Stay: Payer: Commercial Managed Care - PPO

## 2021-07-24 ENCOUNTER — Inpatient Hospital Stay (HOSPITAL_BASED_OUTPATIENT_CLINIC_OR_DEPARTMENT_OTHER): Payer: Commercial Managed Care - PPO | Admitting: Hematology and Oncology

## 2021-07-24 DIAGNOSIS — C50411 Malignant neoplasm of upper-outer quadrant of right female breast: Secondary | ICD-10-CM | POA: Diagnosis not present

## 2021-07-24 DIAGNOSIS — Z171 Estrogen receptor negative status [ER-]: Secondary | ICD-10-CM | POA: Diagnosis not present

## 2021-07-24 DIAGNOSIS — Z95828 Presence of other vascular implants and grafts: Secondary | ICD-10-CM

## 2021-07-24 LAB — CMP (CANCER CENTER ONLY)
ALT: 48 U/L — ABNORMAL HIGH (ref 0–44)
AST: 30 U/L (ref 15–41)
Albumin: 3.9 g/dL (ref 3.5–5.0)
Alkaline Phosphatase: 68 U/L (ref 38–126)
Anion gap: 7 (ref 5–15)
BUN: 13 mg/dL (ref 6–20)
CO2: 26 mmol/L (ref 22–32)
Calcium: 9 mg/dL (ref 8.9–10.3)
Chloride: 106 mmol/L (ref 98–111)
Creatinine: 0.75 mg/dL (ref 0.44–1.00)
GFR, Estimated: >60 mL/min (ref >60)
Glucose, Bld: 110 mg/dL — ABNORMAL HIGH (ref 70–99)
Potassium: 3.5 mmol/L (ref 3.5–5.1)
Sodium: 139 mmol/L (ref 135–145)
Total Bilirubin: 0.6 mg/dL (ref 0.3–1.2)
Total Protein: 6.4 g/dL — ABNORMAL LOW (ref 6.5–8.1)

## 2021-07-24 LAB — CBC WITH DIFFERENTIAL (CANCER CENTER ONLY)
Abs Immature Granulocytes: 0.03 10*3/uL (ref 0.00–0.07)
Basophils Absolute: 0 10*3/uL (ref 0.0–0.1)
Basophils Relative: 0 %
Eosinophils Absolute: 0 10*3/uL (ref 0.0–0.5)
Eosinophils Relative: 0 %
HCT: 28.7 % — ABNORMAL LOW (ref 36.0–46.0)
Hemoglobin: 9.9 g/dL — ABNORMAL LOW (ref 12.0–15.0)
Immature Granulocytes: 0 %
Lymphocytes Relative: 16 %
Lymphs Abs: 1.1 10*3/uL (ref 0.7–4.0)
MCH: 32.2 pg (ref 26.0–34.0)
MCHC: 34.5 g/dL (ref 30.0–36.0)
MCV: 93.5 fL (ref 80.0–100.0)
Monocytes Absolute: 0.5 10*3/uL (ref 0.1–1.0)
Monocytes Relative: 7 %
Neutro Abs: 5.5 10*3/uL (ref 1.7–7.7)
Neutrophils Relative %: 77 %
Platelet Count: 134 10*3/uL — ABNORMAL LOW (ref 150–400)
RBC: 3.07 MIL/uL — ABNORMAL LOW (ref 3.87–5.11)
RDW: 17.9 % — ABNORMAL HIGH (ref 11.5–15.5)
WBC Count: 7.1 10*3/uL (ref 4.0–10.5)
nRBC: 0 % (ref 0.0–0.2)

## 2021-07-24 LAB — PREGNANCY, URINE: Preg Test, Ur: NEGATIVE

## 2021-07-24 MED ORDER — SODIUM CHLORIDE 0.9 % IV SOLN
50.0000 mg/m2 | Freq: Once | INTRAVENOUS | Status: AC
  Administered 2021-07-24: 90 mg via INTRAVENOUS
  Filled 2021-07-24: qty 9

## 2021-07-24 MED ORDER — SODIUM CHLORIDE 0.9 % IV SOLN
690.0000 mg | Freq: Once | INTRAVENOUS | Status: AC
  Administered 2021-07-24: 690 mg via INTRAVENOUS
  Filled 2021-07-24: qty 69

## 2021-07-24 MED ORDER — SODIUM CHLORIDE 0.9% FLUSH
10.0000 mL | Freq: Once | INTRAVENOUS | Status: AC
  Administered 2021-07-24: 10 mL

## 2021-07-24 MED ORDER — SODIUM CHLORIDE 0.9% FLUSH
10.0000 mL | INTRAVENOUS | Status: DC | PRN
  Administered 2021-07-24: 10 mL

## 2021-07-24 MED ORDER — DIPHENHYDRAMINE HCL 25 MG PO CAPS
50.0000 mg | ORAL_CAPSULE | Freq: Once | ORAL | Status: AC
  Administered 2021-07-24: 50 mg via ORAL
  Filled 2021-07-24: qty 2

## 2021-07-24 MED ORDER — SODIUM CHLORIDE 0.9 % IV SOLN
420.0000 mg | Freq: Once | INTRAVENOUS | Status: AC
  Administered 2021-07-24: 420 mg via INTRAVENOUS
  Filled 2021-07-24: qty 14

## 2021-07-24 MED ORDER — SODIUM CHLORIDE 0.9 % IV SOLN: Freq: Once | INTRAVENOUS | Status: AC

## 2021-07-24 MED ORDER — PALONOSETRON HCL INJECTION 0.25 MG/5ML
0.2500 mg | Freq: Once | INTRAVENOUS | Status: AC
  Administered 2021-07-24: 0.25 mg via INTRAVENOUS
  Filled 2021-07-24: qty 5

## 2021-07-24 MED ORDER — LEVOFLOXACIN 500 MG PO TABS: 500.0000 mg | ORAL_TABLET | Freq: Every day | ORAL | 0 refills | Status: DC

## 2021-07-24 MED ORDER — TRASTUZUMAB-ANNS CHEMO 150 MG IV SOLR
6.0000 mg/kg | Freq: Once | INTRAVENOUS | Status: AC
  Administered 2021-07-24: 420 mg via INTRAVENOUS
  Filled 2021-07-24: qty 20

## 2021-07-24 MED ORDER — SODIUM CHLORIDE 0.9 % IV SOLN
150.0000 mg | Freq: Once | INTRAVENOUS | Status: AC
  Administered 2021-07-24: 150 mg via INTRAVENOUS
  Filled 2021-07-24: qty 150

## 2021-07-24 MED ORDER — HEPARIN SOD (PORK) LOCK FLUSH 100 UNIT/ML IV SOLN
500.0000 [IU] | Freq: Once | INTRAVENOUS | Status: AC | PRN
  Administered 2021-07-24: 500 [IU]

## 2021-07-24 MED ORDER — ACETAMINOPHEN 325 MG PO TABS
650.0000 mg | ORAL_TABLET | Freq: Once | ORAL | Status: AC
  Administered 2021-07-24: 650 mg via ORAL
  Filled 2021-07-24: qty 2

## 2021-07-24 NOTE — Patient Instructions (Signed)
[facility] MEDICAL ONCOLOGY  Discharge Instructions: Thank you for choosing [facility] to provide your oncology and hematology care.   If you have a lab appointment with the Cancer Center, please go directly to the Cancer Center and check in at the registration area.   Wear comfortable clothing and clothing appropriate for easy access to any Portacath or PICC line.   We strive to give you quality time with your provider. You may need to reschedule your appointment if you arrive late (15 or more minutes).  Arriving late affects you and other patients whose appointments are after yours.  Also, if you miss three or more appointments without notifying the office, you may be dismissed from the clinic at the provider's discretion.      For prescription refill requests, have your pharmacy contact our office and allow 72 hours for refills to be completed.    Today you received the following chemotherapy and/or immunotherapy agents: Trastuzumab, Pertuzumab, Docetaxel, and Carboplatin      To help prevent nausea and vomiting after your treatment, we encourage you to take your nausea medication as directed.  BELOW ARE SYMPTOMS THAT SHOULD BE REPORTED IMMEDIATELY: *FEVER GREATER THAN 100.4 F (38 C) OR HIGHER *CHILLS OR SWEATING *NAUSEA AND VOMITING THAT IS NOT CONTROLLED WITH YOUR NAUSEA MEDICATION *UNUSUAL SHORTNESS OF BREATH *UNUSUAL BRUISING OR BLEEDING *URINARY PROBLEMS (pain or burning when urinating, or frequent urination) *BOWEL PROBLEMS (unusual diarrhea, constipation, pain near the anus) TENDERNESS IN MOUTH AND THROAT WITH OR WITHOUT PRESENCE OF ULCERS (sore throat, sores in mouth, or a toothache) UNUSUAL RASH, SWELLING OR PAIN  UNUSUAL VAGINAL DISCHARGE OR ITCHING   Items with * indicate a potential emergency and should be followed up as soon as possible or go to the Emergency Department if any problems should occur.  Please show the CHEMOTHERAPY ALERT CARD  or IMMUNOTHERAPY ALERT CARD at check-in to the Emergency Department and triage nurse.  Should you have questions after your visit or need to cancel or reschedule your appointment, please contact [facility]  Dept: [phone]  and follow the prompts.  Office hours are 8:00 a.m. to 4:30 p.m. Monday - Friday. Please note that voicemails left after 4:00 p.m. may not be returned until the following business day.  We are closed weekends and major holidays. You have access to a nurse at all times for urgent questions. Please call the main number to the clinic Dept: [phone] and follow the prompts.   For any non-urgent questions, you may also contact your provider using MyChart. We now offer e-Visits for anyone 18 and older to request care online for non-urgent symptoms. For details visit [email or web address].   Also download the MyChart app! Go to the app store, search "MyChart", open the app, select [facility], and log in with your MyChart username and password.  Due to Covid, a mask is required upon entering the hospital/clinic. If you do not have a mask, one will be given to you upon arrival. For doctor visits, patients may have 1 support person aged 18 or older with them. For treatment visits, patients cannot have anyone with them due to current Covid guidelines and our immunocompromised population.  

## 2021-07-27 ENCOUNTER — Inpatient Hospital Stay: Payer: Commercial Managed Care - PPO | Attending: Hematology and Oncology

## 2021-07-27 ENCOUNTER — Other Ambulatory Visit: Payer: Self-pay

## 2021-07-27 DIAGNOSIS — C50411 Malignant neoplasm of upper-outer quadrant of right female breast: Secondary | ICD-10-CM | POA: Insufficient documentation

## 2021-07-27 DIAGNOSIS — Z171 Estrogen receptor negative status [ER-]: Secondary | ICD-10-CM | POA: Diagnosis not present

## 2021-07-27 DIAGNOSIS — Z79899 Other long term (current) drug therapy: Secondary | ICD-10-CM | POA: Diagnosis not present

## 2021-07-27 DIAGNOSIS — Z5111 Encounter for antineoplastic chemotherapy: Secondary | ICD-10-CM | POA: Diagnosis present

## 2021-07-27 MED ORDER — PEGFILGRASTIM-BMEZ 6 MG/0.6ML ~~LOC~~ SOSY
6.0000 mg | PREFILLED_SYRINGE | Freq: Once | SUBCUTANEOUS | Status: AC
  Administered 2021-07-27: 6 mg via SUBCUTANEOUS
  Filled 2021-07-27: qty 0.6

## 2021-07-27 NOTE — Patient Instructions (Signed)

## 2021-08-02 ENCOUNTER — Encounter: Payer: Self-pay | Admitting: Hematology and Oncology

## 2021-08-03 ENCOUNTER — Encounter: Payer: Self-pay | Admitting: *Deleted

## 2021-08-11 MED FILL — Fosaprepitant Dimeglumine For IV Infusion 150 MG (Base Eq): INTRAVENOUS | Qty: 5 | Status: AC

## 2021-08-12 ENCOUNTER — Other Ambulatory Visit: Payer: Self-pay

## 2021-08-12 ENCOUNTER — Inpatient Hospital Stay (HOSPITAL_BASED_OUTPATIENT_CLINIC_OR_DEPARTMENT_OTHER): Payer: Commercial Managed Care - PPO | Admitting: Adult Health

## 2021-08-12 ENCOUNTER — Inpatient Hospital Stay: Payer: Commercial Managed Care - PPO

## 2021-08-12 ENCOUNTER — Encounter: Payer: Self-pay | Admitting: Adult Health

## 2021-08-12 ENCOUNTER — Encounter: Payer: Self-pay | Admitting: *Deleted

## 2021-08-12 VITALS — BP 142/75 | HR 82 | Temp 97.3°F | Resp 18 | Ht 69.0 in | Wt 162.8 lb

## 2021-08-12 DIAGNOSIS — Z171 Estrogen receptor negative status [ER-]: Secondary | ICD-10-CM

## 2021-08-12 DIAGNOSIS — C50411 Malignant neoplasm of upper-outer quadrant of right female breast: Secondary | ICD-10-CM

## 2021-08-12 DIAGNOSIS — Z95828 Presence of other vascular implants and grafts: Secondary | ICD-10-CM

## 2021-08-12 LAB — CBC WITH DIFFERENTIAL (CANCER CENTER ONLY)
Abs Immature Granulocytes: 0.02 10*3/uL (ref 0.00–0.07)
Basophils Absolute: 0 10*3/uL (ref 0.0–0.1)
Basophils Relative: 0 %
Eosinophils Absolute: 0 10*3/uL (ref 0.0–0.5)
Eosinophils Relative: 0 %
HCT: 28.3 % — ABNORMAL LOW (ref 36.0–46.0)
Hemoglobin: 9.8 g/dL — ABNORMAL LOW (ref 12.0–15.0)
Immature Granulocytes: 0 %
Lymphocytes Relative: 25 %
Lymphs Abs: 1.2 10*3/uL (ref 0.7–4.0)
MCH: 34.4 pg — ABNORMAL HIGH (ref 26.0–34.0)
MCHC: 34.6 g/dL (ref 30.0–36.0)
MCV: 99.3 fL (ref 80.0–100.0)
Monocytes Absolute: 0.2 10*3/uL (ref 0.1–1.0)
Monocytes Relative: 5 %
Neutro Abs: 3.4 10*3/uL (ref 1.7–7.7)
Neutrophils Relative %: 70 %
Platelet Count: 103 10*3/uL — ABNORMAL LOW (ref 150–400)
RBC: 2.85 MIL/uL — ABNORMAL LOW (ref 3.87–5.11)
RDW: 18 % — ABNORMAL HIGH (ref 11.5–15.5)
WBC Count: 4.8 10*3/uL (ref 4.0–10.5)
nRBC: 0 % (ref 0.0–0.2)

## 2021-08-12 LAB — CMP (CANCER CENTER ONLY)
ALT: 27 U/L (ref 0–44)
AST: 22 U/L (ref 15–41)
Albumin: 3.8 g/dL (ref 3.5–5.0)
Alkaline Phosphatase: 82 U/L (ref 38–126)
Anion gap: 5 (ref 5–15)
BUN: 19 mg/dL (ref 6–20)
CO2: 26 mmol/L (ref 22–32)
Calcium: 8.8 mg/dL — ABNORMAL LOW (ref 8.9–10.3)
Chloride: 109 mmol/L (ref 98–111)
Creatinine: 0.8 mg/dL (ref 0.44–1.00)
GFR, Estimated: >60 mL/min (ref >60)
Glucose, Bld: 125 mg/dL — ABNORMAL HIGH (ref 70–99)
Potassium: 3.9 mmol/L (ref 3.5–5.1)
Sodium: 140 mmol/L (ref 135–145)
Total Bilirubin: 0.3 mg/dL (ref 0.3–1.2)
Total Protein: 6.2 g/dL — ABNORMAL LOW (ref 6.5–8.1)

## 2021-08-12 LAB — PREGNANCY, URINE: Preg Test, Ur: NEGATIVE

## 2021-08-12 MED ORDER — SODIUM CHLORIDE 0.9 % IV SOLN
50.0000 mg/m2 | Freq: Once | INTRAVENOUS | Status: AC
  Administered 2021-08-12: 90 mg via INTRAVENOUS
  Filled 2021-08-12: qty 9

## 2021-08-12 MED ORDER — DIPHENHYDRAMINE HCL 25 MG PO CAPS
50.0000 mg | ORAL_CAPSULE | Freq: Once | ORAL | Status: AC
  Administered 2021-08-12: 50 mg via ORAL
  Filled 2021-08-12: qty 2

## 2021-08-12 MED ORDER — SODIUM CHLORIDE 0.9% FLUSH
10.0000 mL | INTRAVENOUS | Status: DC | PRN
  Administered 2021-08-12: 10 mL

## 2021-08-12 MED ORDER — TRASTUZUMAB-ANNS CHEMO 150 MG IV SOLR
6.0000 mg/kg | Freq: Once | INTRAVENOUS | Status: AC
  Administered 2021-08-12: 420 mg via INTRAVENOUS
  Filled 2021-08-12: qty 20

## 2021-08-12 MED ORDER — ACETAMINOPHEN 325 MG PO TABS
650.0000 mg | ORAL_TABLET | Freq: Once | ORAL | Status: AC
  Administered 2021-08-12: 650 mg via ORAL
  Filled 2021-08-12: qty 2

## 2021-08-12 MED ORDER — SODIUM CHLORIDE 0.9 % IV SOLN
150.0000 mg | Freq: Once | INTRAVENOUS | Status: AC
  Administered 2021-08-12: 150 mg via INTRAVENOUS
  Filled 2021-08-12: qty 150

## 2021-08-12 MED ORDER — PALONOSETRON HCL INJECTION 0.25 MG/5ML
0.2500 mg | Freq: Once | INTRAVENOUS | Status: AC
  Administered 2021-08-12: 0.25 mg via INTRAVENOUS
  Filled 2021-08-12: qty 5

## 2021-08-12 MED ORDER — SODIUM CHLORIDE 0.9 % IV SOLN: Freq: Once | INTRAVENOUS | Status: AC

## 2021-08-12 MED ORDER — SODIUM CHLORIDE 0.9 % IV SOLN
687.0000 mg | Freq: Once | INTRAVENOUS | Status: AC
  Administered 2021-08-12: 690 mg via INTRAVENOUS
  Filled 2021-08-12: qty 69

## 2021-08-12 MED ORDER — SODIUM CHLORIDE 0.9 % IV SOLN
420.0000 mg | Freq: Once | INTRAVENOUS | Status: AC
  Administered 2021-08-12: 420 mg via INTRAVENOUS
  Filled 2021-08-12: qty 14

## 2021-08-12 MED ORDER — HEPARIN SOD (PORK) LOCK FLUSH 100 UNIT/ML IV SOLN
500.0000 [IU] | Freq: Once | INTRAVENOUS | Status: AC | PRN
  Administered 2021-08-12: 500 [IU]

## 2021-08-12 MED ORDER — SODIUM CHLORIDE 0.9% FLUSH
10.0000 mL | Freq: Once | INTRAVENOUS | Status: AC
  Administered 2021-08-12: 10 mL

## 2021-08-12 NOTE — Progress Notes (Signed)
[facility] Follow up: ?  ? ?[facility], [facility] ?[address] ?[place] [address] ? ? ?DIAGNOSIS:  Cancer Staging  ?Malignant neoplasm of upper-outer quadrant of right breast in female, estrogen receptor negative ([place]) ?Staging form: Breast, AJCC 8th Edition ?- Clinical stage from 04/08/2021: Stage IIB (cT2, cN1, cM0, G3, ER-, PR-, HER2+) - Signed by Nicholas Lose, MD on 04/15/2021 ?Stage prefix: Initial diagnosis ?Method of lymph node assessment: Axillary lymph node dissection ?Histologic grading system: 3 grade system ? ? ?SUMMARY OF ONCOLOGIC HISTORY: ?Oncology History  ?Malignant neoplasm of upper-outer quadrant of right breast in female, estrogen receptor negative ([place])  ?04/08/2021 Initial Diagnosis  ? History of right breast eczematous change to the nipple, patient had palpable lump in the right breast her mammogram 2.1 cm spiculated mass with extensive calcifications that span 11 cm.  Ultrasound revealed 3 cm mass at 10:00 plus small satellite lesions.  Biopsy revealed grade 3 IDC ER 0%, PR 0%, HER2 3+ positive, Ki-67 30% ?  ?04/08/2021 Cancer Staging  ? Staging form: Breast, AJCC 8th Edition ?- Clinical stage from 04/08/2021: Stage IIB (cT2, cN1, cM0, G3, ER-, PR-, HER2+) - Signed by Nicholas Lose, MD on 04/15/2021 ?Stage prefix: Initial diagnosis ?Method of lymph node assessment: Axillary lymph node dissection ?Histologic grading system: 3 grade system ? ?  ?05/01/2021 -  Chemotherapy  ? Patient is on Treatment Plan : BREAST  Docetaxel + Carboplatin + Trastuzumab + Pertuzumab  (TCHP) q21d   ? ?   ?05/21/2021 Genetic Testing  ? Negative hereditary cancer genetic testing: no pathogenic variants detected in [facility] CustomNext-Cancer +RNAinsight Panel.  Report date is 05/21/2021.  ? ?The CustomNext-Cancer+RNAinsight panel offered by [facility] includes sequencing and rearrangement analysis for the following 47 genes:  APC, ATM, AXIN2, BARD1, BMPR1A, BRCA1, BRCA2, BRIP1, CDH1, CDK4,  CDKN2A, CHEK2, DICER1, EPCAM, GREM1, HOXB13, MEN1, MLH1, MSH2, MSH3, MSH6, MUTYH, NBN, NF1, NF2, NTHL1, PALB2, PMS2, POLD1, POLE, PTEN, RAD51C, RAD51D, RECQL, RET, SDHA, SDHAF2, SDHB, SDHC, SDHD, SMAD4, SMARCA4, STK11, TP53, TSC1, TSC2, and VHL.  RNA data is routinely analyzed for use in variant interpretation for all genes. ?  ? ? ?CURRENT THERAPY: TCHP ? ?INTERVAL HISTORY: ?Lacresha Fusilier 31 y.o. female returns for follow up prior to receiving her sixth cycle of neoadjuvant chemotherapy with TCHP.  Her breast MRI is scheduled on 08/13/2021 followed by an appointment with Dr. Ninfa Linden on 08/14/2021.   ? ?Rachal is doing well today.  She denies peripheral neuropathy.  Her last echocardiogram was completed on 04/28/2021 and she has no cardiac issues including orthopnea, swelling, DOE, or palpitations.   ? ? ?Patient Active Problem List  ? Diagnosis Date Noted  ? Port-A-Cath in place 07/17/2021  ? Family history of breast cancer 04/21/2021  ? Genetic testing 04/21/2021  ? Malignant neoplasm of upper-outer quadrant of right breast in female, estrogen receptor negative ([place]) 04/14/2021  ? ? ?has No Known Allergies. ? ?MEDICAL HISTORY: ?Past Medical History:  ?Diagnosis Date  ? Family history of breast cancer 04/21/2021  ? right breast ca 03/2021  ? ? ?SURGICAL HISTORY: ?Past Surgical History:  ?Procedure Laterality Date  ? CESAREAN SECTION N/A 12/10/2015  ? Procedure: CESAREAN SECTION;  Surgeon: Aloha Gell, MD;  Location: [facility];  Service: Obstetrics;  Laterality: N/A;  ? IR CV LINE INJECTION  06/25/2021  ? IR RADIOLOGY PERIPHERAL GUIDED IV START  06/25/2021  ? IR US GUIDE VASC ACCESS LEFT  06/25/2021  ? IR VENO/EXT/UNI LEFT  06/25/2021  ? PORTACATH PLACEMENT Left 04/30/2021  ? Procedure: INSERTION PORT-A-CATH;  Surgeon: Coralie Keens, MD;  Location: [facility];  Service: General;  Laterality: Left;  ? WISDOM TOOTH EXTRACTION    ? ? ?SOCIAL HISTORY: ?Social History  ? ?Socioeconomic History   ? Marital status: Married  ?  Spouse name: Not on file  ? Number of children: Not on file  ? Years of education: Not on file  ? Highest education level: Not on file  ?Occupational History  ? Not on file  ?Tobacco Use  ? Smoking status: Never  ? Smokeless tobacco: Never  ?Substance and Sexual Activity  ? Alcohol use: Not Currently  ?  Alcohol/week: 2.0 standard drinks  ?  Types: 2 Standard drinks or equivalent per week  ? Drug use: No  ? Sexual activity: Not Currently  ?  Partners: Male  ?  Birth control/protection: Pill  ?  Comment: Loestrin  ?Other Topics Concern  ? Not on file  ?Social History Narrative  ? Not on file  ? ?Social Determinants of Health  ? ?Financial Resource Strain: Not on file  ?Food Insecurity: Not on file  ?Transportation Needs: Not on file  ?Physical Activity: Not on file  ?Stress: Not on file  ?Social Connections: Not on file  ?Intimate Partner Violence: Not on file  ? ? ?FAMILY HISTORY: ?Family History  ?Problem Relation Age of Onset  ? Hypothyroidism Mother   ? Breast cancer Maternal Grandmother 15  ? Skin cancer Maternal Grandfather   ? Heart failure Paternal Grandfather   ? ? ?Review of Systems  ?Constitutional:  Positive for fatigue (mild). Negative for appetite change, chills, fever and unexpected weight change.  ?HENT:   Negative for hearing loss, lump/mass and trouble swallowing.   ?Eyes:  Negative for eye problems and icterus.  ?Respiratory:  Negative for chest tightness, cough and shortness of breath.   ?Cardiovascular:  Negative for chest pain, leg swelling and palpitations.  ?Gastrointestinal:  Negative for abdominal distention, abdominal pain, constipation, diarrhea, nausea and vomiting.  ?Endocrine: Negative for hot flashes.  ?Genitourinary:  Negative for difficulty urinating.   ?Musculoskeletal:  Negative for arthralgias.  ?Skin:  Negative for itching and rash.  ?Neurological:  Negative for dizziness, extremity weakness, headaches and numbness.  ?Hematological:  Negative for  adenopathy. Does not bruise/bleed easily.  ?Psychiatric/Behavioral:  Negative for depression. The patient is not nervous/anxious.    ? ? ?PHYSICAL EXAMINATION ? ?ECOG PERFORMANCE STATUS: 1 - Symptomatic but completely ambulatory ? ?Vitals:  ? 08/12/21 0900  ?BP: (!) 142/75  ?Pulse: 82  ?Resp: 18  ?Temp: (!) 97.3 ?F (36.3 ?C)  ?SpO2: 100%  ? ? ?Physical Exam ?Constitutional:   ?   General: She is not in acute distress. ?   Appearance: Normal appearance. She is not toxic-appearing.  ?HENT:  ?   Head: Normocephalic and atraumatic.  ?Eyes:  ?   General: No scleral icterus. ?Cardiovascular:  ?   Rate and Rhythm: Normal rate and regular rhythm.  ?   Pulses: Normal pulses.  ?   Heart sounds: Normal heart sounds.  ?Pulmonary:  ?   Effort: Pulmonary effort is normal.  ?   Breath sounds: Normal breath sounds.  ?Abdominal:  ?   General: Abdomen is flat. Bowel sounds are normal. There is no distension.  ?   Palpations: Abdomen is soft.  ?   Tenderness: There is no abdominal tenderness.  ?Musculoskeletal:     ?   General: No  swelling.  ?   Cervical back: Neck supple.  ?Lymphadenopathy:  ?   Cervical: No cervical adenopathy.  ?Skin: ?   General: Skin is warm and dry.  ?   Findings: No rash.  ?Neurological:  ?   General: No focal deficit present.  ?   Mental Status: She is alert.  ?Psychiatric:     ?   Mood and Affect: Mood normal.     ?   Behavior: Behavior normal.  ? ? ?LABORATORY DATA: ? ?CBC ?   ?Component Value Date/Time  ? WBC 4.8 08/12/2021 0851  ? WBC 10.5 05/22/2021 1028  ? RBC 2.85 (L) 08/12/2021 0851  ? HGB 9.8 (L) 08/12/2021 0851  ? HGB 14.0 02/11/2015 1455  ? HCT 28.3 (L) 08/12/2021 0851  ? PLT 103 (L) 08/12/2021 0851  ? MCV 99.3 08/12/2021 0851  ? MCH 34.4 (H) 08/12/2021 0851  ? MCHC 34.6 08/12/2021 0851  ? RDW 18.0 (H) 08/12/2021 0851  ? LYMPHSABS 1.2 08/12/2021 0851  ? MONOABS 0.2 08/12/2021 0851  ? EOSABS 0.0 08/12/2021 0851  ? BASOSABS 0.0 08/12/2021 0851  ? ? ?CMP  ?   ?Component Value Date/Time  ? NA 140  08/12/2021 0851  ? K 3.9 08/12/2021 0851  ? CL 109 08/12/2021 0851  ? CO2 26 08/12/2021 0851  ? GLUCOSE 125 (H) 08/12/2021 0851  ? BUN 19 08/12/2021 0851  ? CREATININE 0.80 08/12/2021 0851  ? CALCIUM 8.8 (L) 05/17/2

## 2021-08-12 NOTE — Assessment & Plan Note (Addendum)
04/08/2021:History of right breast eczematous change to the nipple, patient had palpable lump in the right breast her mammogram 2.1 cm spiculated mass with extensive calcifications that span 11 cm. ?Ultrasound revealed 3 cm mass at 10:00 plus small satellite lesions. ?Biopsy revealed grade 3 IDC ER 0%, PR 0%, HER2 3+ positive, Ki-67 30% ?? ?Recommendation: ?1. Neoadjuvant chemotherapy with [place] Perjeta ?6 cycles followed by Herceptin Perjeta maintenance versus Kadcyla maintenance (based on response to neoadjuvant chemo) for 1 year ?2. Followed by?mastectomy with reconstruction ?3. Followed by adjuvant radiation therapy?? ?? ?Breast MRI 04/27/2021: Extensive non-mass enhancement throughout all quadrants of the breast largest confluent area 2.9 cm, right nipple enhancement consistent with DCIS, 1.9 cm right axillary lymph node, left breast negative ?----------------------------------------------------------------------------------------------------------------------------------------------- ?CT CAP and bone scan:?Per Dr. Lindi Adie review there does not appear to be any evidence of metastatic disease. ?Current treatment: Cycle 6 of neoadjuvant TCHP ?? ??Chemo Toxicities: ?1. Fatigue ?2. Vaginal dryness:?Using?Replens.?? ?3. Acneform rash on the face: on topical clindamycin. ?4. 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 ?Nakeya's last echo was 04/28/2021.  She has no cardiac symptoms therefore will proceed with treatment today.  I ordered an echocardiogram to be completed int he next 3 weeks prior to her next Herceptin treatment.  We discussed the role of HER2 directed therapy following surgery.  We discussed that based on her pathology results she will either receive Herceptin/Perjeta or Kadcyla.  She understands this.   ? ?They have a trip scheduled in June they are trying to work her appointments around which I informed her we would accommodate.   ? ?Return to clinic in 3 weeks for Herceptin/Perjeta. ?

## 2021-08-12 NOTE — Patient Instructions (Signed)
[facility]   ?Discharge Instructions: ?Thank you for choosing [facility] to provide your oncology and hematology care.  ? ?If you have a lab appointment with the [facility], please go directly to the [facility] and check in at the registration area. ?  ?Wear comfortable clothing and clothing appropriate for easy access to any Portacath or PICC line.  ? ?We strive to give you quality time with your provider. You may need to reschedule your appointment if you arrive late (15 or more minutes).  Arriving late affects you and other patients whose appointments are after yours.  Also, if you miss three or more appointments without notifying the office, you may be dismissed from the clinic at the provider?s discretion.    ?  ?For prescription refill requests, have your pharmacy contact our office and allow 72 hours for refills to be completed.   ? ?Today you received the following chemotherapy and/or immunotherapy agents: trastuzumab-anns, pertuzumab, docetaxel, and carboplatin    ?  ?To help prevent nausea and vomiting after your treatment, we encourage you to take your nausea medication as directed. ? ?BELOW ARE SYMPTOMS THAT SHOULD BE REPORTED IMMEDIATELY: ?*FEVER GREATER THAN 100.4 F (38 ?C) OR HIGHER ?*CHILLS OR SWEATING ?*NAUSEA AND VOMITING THAT IS NOT CONTROLLED WITH YOUR NAUSEA MEDICATION ?*UNUSUAL SHORTNESS OF BREATH ?*UNUSUAL BRUISING OR BLEEDING ?*URINARY PROBLEMS (pain or burning when urinating, or frequent urination) ?*BOWEL PROBLEMS (unusual diarrhea, constipation, pain near the anus) ?TENDERNESS IN MOUTH AND THROAT WITH OR WITHOUT PRESENCE OF ULCERS (sore throat, sores in mouth, or a toothache) ?UNUSUAL RASH, SWELLING OR PAIN  ?UNUSUAL VAGINAL DISCHARGE OR ITCHING  ? ?Items with * indicate a potential emergency and should be followed up as soon as possible or go to the Emergency Department if any problems should occur. ? ?Please show the CHEMOTHERAPY ALERT  CARD or IMMUNOTHERAPY ALERT CARD at check-in to the Emergency Department and triage nurse. ? ?Should you have questions after your visit or need to cancel or reschedule your appointment, please contact [facility]  Dept: [phone]  and follow the prompts.  Office hours are 8:00 a.m. to 4:30 p.m. Monday - Friday. Please note that voicemails left after 4:00 p.m. may not be returned until the following business day.  We are closed weekends and major holidays. You have access to a nurse at all times for urgent questions. Please call the main number to the clinic Dept: [phone] and follow the prompts. ? ? ?For any non-urgent questions, you may also contact your provider using MyChart. We now offer e-Visits for anyone 35 and older to request care online for non-urgent symptoms. For details visit [email or web address]. ?  ?Also download the MyChart app! Go to the app store, search "MyChart", open the app, select [facility], and log in with your MyChart username and password. ? ?Due to Covid, a mask is required upon entering the hospital/clinic. If you do not have a mask, one will be given to you upon arrival. For doctor visits, patients may have 1 support person aged 46 or older with them. For treatment visits, patients cannot have anyone with them due to current Covid guidelines and our immunocompromised population.  ? ?

## 2021-08-12 NOTE — Progress Notes (Signed)
Per Mendel Ryder, okay to treat with echo from 1/31 ?

## 2021-08-13 ENCOUNTER — Ambulatory Visit (HOSPITAL_COMMUNITY)
Admission: RE | Admit: 2021-08-13 | Discharge: 2021-08-13 | Disposition: A | Discharge status: Home / Self Care | Payer: Commercial Managed Care - PPO | Source: Ambulatory Visit | Attending: Hematology and Oncology | Admitting: Hematology and Oncology

## 2021-08-13 ENCOUNTER — Telehealth: Payer: Self-pay | Admitting: Adult Health

## 2021-08-13 DIAGNOSIS — C50411 Malignant neoplasm of upper-outer quadrant of right female breast: Secondary | ICD-10-CM | POA: Diagnosis present

## 2021-08-13 DIAGNOSIS — Z171 Estrogen receptor negative status [ER-]: Secondary | ICD-10-CM | POA: Diagnosis present

## 2021-08-13 MED ORDER — GADOBUTROL 1 MMOL/ML IV SOLN
7.0000 mL | Freq: Once | INTRAVENOUS | Status: AC | PRN
  Administered 2021-08-13: 7 mL via INTRAVENOUS

## 2021-08-13 NOTE — Telephone Encounter (Signed)
Scheduled appointment per 5/18 los. Patient is aware.

## 2021-08-14 ENCOUNTER — Inpatient Hospital Stay: Payer: Commercial Managed Care - PPO

## 2021-08-14 ENCOUNTER — Other Ambulatory Visit: Payer: Self-pay

## 2021-08-14 ENCOUNTER — Encounter: Payer: Self-pay | Admitting: *Deleted

## 2021-08-14 ENCOUNTER — Encounter: Payer: Self-pay | Admitting: Hematology and Oncology

## 2021-08-14 VITALS — BP 130/76 | HR 67 | Temp 98.6°F | Resp 18

## 2021-08-14 DIAGNOSIS — C50411 Malignant neoplasm of upper-outer quadrant of right female breast: Secondary | ICD-10-CM | POA: Diagnosis not present

## 2021-08-14 DIAGNOSIS — Z171 Estrogen receptor negative status [ER-]: Secondary | ICD-10-CM

## 2021-08-14 MED ORDER — PEGFILGRASTIM-BMEZ 6 MG/0.6ML ~~LOC~~ SOSY
6.0000 mg | PREFILLED_SYRINGE | Freq: Once | SUBCUTANEOUS | Status: AC
  Administered 2021-08-14: 6 mg via SUBCUTANEOUS
  Filled 2021-08-14: qty 0.6

## 2021-08-17 ENCOUNTER — Other Ambulatory Visit: Payer: Self-pay | Admitting: *Deleted

## 2021-08-17 DIAGNOSIS — Z171 Estrogen receptor negative status [ER-]: Secondary | ICD-10-CM

## 2021-08-17 NOTE — Progress Notes (Signed)
Per MD request, RN successfully faxed referral to Dr. Dalphine Handing.

## 2021-08-20 ENCOUNTER — Other Ambulatory Visit: Payer: Self-pay

## 2021-08-20 ENCOUNTER — Ambulatory Visit (HOSPITAL_COMMUNITY)
Admission: RE | Admit: 2021-08-20 | Discharge: 2021-08-20 | Disposition: A | Discharge status: Home / Self Care | Payer: Commercial Managed Care - PPO | Source: Ambulatory Visit | Attending: Adult Health | Admitting: Adult Health

## 2021-08-20 ENCOUNTER — Inpatient Hospital Stay (HOSPITAL_BASED_OUTPATIENT_CLINIC_OR_DEPARTMENT_OTHER): Payer: Commercial Managed Care - PPO | Admitting: Hematology and Oncology

## 2021-08-20 DIAGNOSIS — Z171 Estrogen receptor negative status [ER-]: Secondary | ICD-10-CM | POA: Diagnosis not present

## 2021-08-20 DIAGNOSIS — Z79899 Other long term (current) drug therapy: Secondary | ICD-10-CM | POA: Insufficient documentation

## 2021-08-20 DIAGNOSIS — C50911 Malignant neoplasm of unspecified site of right female breast: Secondary | ICD-10-CM | POA: Diagnosis not present

## 2021-08-20 DIAGNOSIS — Z0189 Encounter for other specified special examinations: Secondary | ICD-10-CM | POA: Diagnosis not present

## 2021-08-20 DIAGNOSIS — Z5181 Encounter for therapeutic drug level monitoring: Secondary | ICD-10-CM | POA: Diagnosis present

## 2021-08-20 DIAGNOSIS — C50411 Malignant neoplasm of upper-outer quadrant of right female breast: Secondary | ICD-10-CM

## 2021-08-20 LAB — ECHOCARDIOGRAM COMPLETE
AR max vel: 1.93 cm2
AV Area VTI: 1.89 cm2
AV Area mean vel: 1.92 cm2
AV Mean grad: 7 mmHg
AV Peak grad: 13.4 mmHg
Ao pk vel: 1.83 m/s
Area-P 1/2: 3.54 cm2
Calc EF: 66 %
MV M vel: 4.21 m/s
MV Peak grad: 70.9 mmHg
S' Lateral: 2.9 cm
Single Plane A2C EF: 68.3 %
Single Plane A4C EF: 64 %

## 2021-08-20 NOTE — Progress Notes (Addendum)
Patient Care Team: Group, [facility] as PCP - Philomena Doheny, Paulette Blanch, RN as Oncology Nurse Navigator Rockwell Germany, RN as Oncology Nurse Navigator Nicholas Lose, MD as Consulting Physician (Hematology and Oncology) Coralie Keens, MD as Consulting Physician (General Surgery) Eppie Gibson, MD as Attending Physician (Radiation Oncology)  DIAGNOSIS:  Encounter Diagnosis  Name Primary?   Malignant neoplasm of upper-outer quadrant of right breast in female, estrogen receptor negative ([place])     SUMMARY OF ONCOLOGIC HISTORY: Oncology History  Malignant neoplasm of upper-outer quadrant of right breast in female, estrogen receptor negative ([place])  04/08/2021 Initial Diagnosis   History of right breast eczematous change to the nipple, patient had palpable lump in the right breast her mammogram 2.1 cm spiculated mass with extensive calcifications that span 11 cm.  Ultrasound revealed 3 cm mass at 10:00 plus small satellite lesions.  Biopsy revealed grade 3 IDC ER 0%, PR 0%, HER2 3+ positive, Ki-67 30%   04/08/2021 Cancer Staging   Staging form: Breast, AJCC 8th Edition - Clinical stage from 04/08/2021: Stage IIB (cT2, cN1, cM0, G3, ER-, PR-, HER2+) - Signed by Nicholas Lose, MD on 04/15/2021 Stage prefix: Initial diagnosis Method of lymph node assessment: Axillary lymph node dissection Histologic grading system: 3 grade system    05/01/2021 -  Chemotherapy   Patient is on Treatment Plan : BREAST  Docetaxel + Carboplatin + Trastuzumab + Pertuzumab  (TCHP) q21d       05/21/2021 Genetic Testing   Negative hereditary cancer genetic testing: no pathogenic variants detected in [facility] CustomNext-Cancer +RNAinsight Panel.  Report date is 05/21/2021.   The CustomNext-Cancer+RNAinsight panel offered by [facility] includes sequencing and rearrangement analysis for the following 47 genes:  APC, ATM, AXIN2, BARD1, BMPR1A, BRCA1, BRCA2, BRIP1, CDH1, CDK4, CDKN2A, CHEK2, DICER1, EPCAM, GREM1,  HOXB13, MEN1, MLH1, MSH2, MSH3, MSH6, MUTYH, NBN, NF1, NF2, NTHL1, PALB2, PMS2, POLD1, POLE, PTEN, RAD51C, RAD51D, RECQL, RET, SDHA, SDHAF2, SDHB, SDHC, SDHD, SMAD4, SMARCA4, STK11, TP53, TSC1, TSC2, and VHL.  RNA data is routinely analyzed for use in variant interpretation for all genes.     CHIEF COMPLIANT: Discuss surgery and radiation  INTERVAL HISTORY: Tara White is a 31 y.o with the above mention. She presents to the clinic today for discussion for surgery and radiation.  She completed 6 cycles of chemotherapy and underwent a breast MRI which showed remarkable response to treatment.  She met with Dr. Iran Planas with plastic surgery and had several questions and she wanted to make sure all of her questions were answered.   ALLERGIES:  has No Known Allergies.  MEDICATIONS:  Current Outpatient Medications  Medication Sig Dispense Refill   clindamycin (CLINDAGEL) 1 % gel clindamycin 1 % topical gel  APPLY TOPICALLY TWICE A DAY     chlorpheniramine-HYDROcodone 10-8 MG/5ML hydrocodone 10 mg-chlorpheniramine 8 mg/5 mL oral susp extend.rel 12hr  Take 5 mL every 12 hours by oral route as needed.     Homeopathic Products (FRANKINCENSE UPLIFTING) OIL Apply 2 drops topically.     Lactobacillus Rhamnosus, GG, (CULTURELLE) CAPS Take 1 capsule by mouth daily.     lidocaine-prilocaine (EMLA) cream Apply to affected area once 30 g 3   LORazepam (ATIVAN) 0.5 MG tablet Take 1 tablet (0.5 mg total) by mouth at bedtime as needed (Nausea or vomiting). 30 tablet 0   magic mouthwash w/lidocaine SOLN Take 5 mLs by mouth 4 (four) times daily as needed for mouth pain. 240 mL 1   ondansetron (ZOFRAN) 8 MG  tablet Take 1 tablet (8 mg total) by mouth 2 (two) times daily as needed (Nausea or vomiting). Start on the third day after chemotherapy. 30 tablet 1   PROBIOTIC PRODUCT PO Take by mouth.     prochlorperazine (COMPAZINE) 10 MG tablet Take 1 tablet (10 mg total) by mouth every 6 (six) hours as needed  (Nausea or vomiting). 30 tablet 1   rivaroxaban (XARELTO) 20 MG TABS tablet Take 1 tablet (20 mg total) by mouth daily with supper. 30 tablet 6   No current facility-administered medications for this visit.    PHYSICAL EXAMINATION: ECOG PERFORMANCE STATUS: 1 - Symptomatic but completely ambulatory  Vitals:   08/20/21 0940  BP: 128/67  Pulse: 94  Resp: 18  Temp: 97.9 F (36.6 C)  SpO2: 100%   Filed Weights   08/20/21 0940  Weight: 165 lb 12.8 oz (75.2 kg)      LABORATORY DATA:  I have reviewed the data as listed    Latest Ref Rng & Units 08/12/2021    8:51 AM 07/24/2021    8:30 AM 07/17/2021   11:03 AM  CMP  Glucose 70 - 99 mg/dL 125   110   94    BUN 6 - 20 mg/dL 19   13   17     Creatinine 0.44 - 1.00 mg/dL 0.80   0.75   0.87    Sodium 135 - 145 mmol/L 140   139   139    Potassium 3.5 - 5.1 mmol/L 3.9   3.5   3.9    Chloride 98 - 111 mmol/L 109   106   107    CO2 22 - 32 mmol/L 26   26   27     Calcium 8.9 - 10.3 mg/dL 8.8   9.0   8.7    Total Protein 6.5 - 8.1 g/dL 6.2   6.4   6.3    Total Bilirubin 0.3 - 1.2 mg/dL 0.3   0.6   0.3    Alkaline Phos 38 - 126 U/L 82   68   90    AST 15 - 41 U/L 22   30   41    ALT 0 - 44 U/L 27   48   64      Lab Results  Component Value Date   WBC 4.8 08/12/2021   HGB 9.8 (L) 08/12/2021   HCT 28.3 (L) 08/12/2021   MCV 99.3 08/12/2021   PLT 103 (L) 08/12/2021   NEUTROABS 3.4 08/12/2021    ASSESSMENT & PLAN:  Malignant neoplasm of upper-outer quadrant of right breast in female, estrogen receptor negative ([place]) 04/08/2021:History of right breast eczematous change to the nipple, patient had palpable lump in the right breast her mammogram 2.1 cm spiculated mass with extensive calcifications that span 11 cm.  Ultrasound revealed 3 cm mass at 10:00 plus small satellite lesions.  Biopsy revealed grade 3 IDC ER 0%, PR 0%, HER2 3+ positive, Ki-67 30%   Recommendation: 1. Neoadjuvant chemotherapy with TCH Perjeta 6 cycles completed  08/12/2021 followed by Herceptin Perjeta maintenance versus Kadcyla maintenance (based on response to neoadjuvant chemo) for 1 year 2. Followed by mastectomy with reconstruction 3. Followed by adjuvant radiation therapy     Breast MRI 04/27/2021: Extensive non-mass enhancement throughout all quadrants of the breast largest confluent area 2.9 cm, right nipple enhancement consistent with DCIS, 1.9 cm right axillary lymph node, left breast negative ----------------------------------------------------------------------------------------------------------------------------------------------- Breast MRI 08/13/2021: No abnormal enhancement identified in the  right breast.  Normalization of the right axillary lymph node now measuring 0.8 cm.  Discussion: In spite of the excellent response to chemotherapy, we still recommend a mastectomy because of the extent of disease at the time of diagnosis.  Patient is requesting a second opinion regarding reconstruction.  Return to clinic after surgery to discuss results. She is likely to undergo surgery at the end of July.  She has several birthdays and family trips planned and wanted to finish all that prior to surgery.  In the interim we will give her Herceptin and Perjeta maintenance  No orders of the defined types were placed in this encounter.  The patient has a good understanding of the overall plan. she agrees with it. she will call with any problems that may develop before the next visit here. Total time spent: 30 mins including face to face time and time spent for planning, charting and co-ordination of care   Harriette Ohara, MD 08/20/21    I Gardiner Coins am scribing for Dr. Lindi Adie  I have reviewed the above documentation for accuracy and completeness, and I agree with the above.

## 2021-08-20 NOTE — Progress Notes (Signed)
2D Echocardiogram has been performed.  [name] 08/20/2021, 8:53 AM

## 2021-08-20 NOTE — Assessment & Plan Note (Signed)
04/08/2021:History of right breast eczematous change to the nipple, patient had palpable lump in the right breast her mammogram 2.1 cm spiculated mass with extensive calcifications that span 11 cm. Ultrasound revealed 3 cm mass at 10:00 plus small satellite lesions. Biopsy revealed grade 3 IDC ER 0%, PR 0%, HER2 3+ positive, Ki-67 30%  Recommendation: 1. Neoadjuvant chemotherapy with TCH Perjeta 6 cycles completed 08/12/2021 followed by Herceptin Perjeta maintenance versus Kadcyla maintenance (based on response to neoadjuvant chemo) for 1 year 2. Followed bymastectomy with reconstruction 3. Followed by adjuvant radiation therapy  Breast MRI 04/27/2021: Extensive non-mass enhancement throughout all quadrants of the breast largest confluent area 2.9 cm, right nipple enhancement consistent with DCIS, 1.9 cm right axillary lymph node, left breast negative ----------------------------------------------------------------------------------------------------------------------------------------------- Breast MRI 08/13/2021: No abnormal enhancement identified in the right breast.  Normalization of the right axillary lymph node now measuring 0.8 cm.  Discussion: In spite of the excellent response to chemotherapy, we still recommend a mastectomy because of the extent of disease at the time of diagnosis.  Patient is requesting a second opinion regarding reconstruction.  Return to clinic after surgery to discuss results.

## 2021-08-21 ENCOUNTER — Other Ambulatory Visit: Payer: Self-pay | Admitting: Surgery

## 2021-08-21 DIAGNOSIS — Z853 Personal history of malignant neoplasm of breast: Secondary | ICD-10-CM

## 2021-08-23 ENCOUNTER — Encounter: Payer: Self-pay | Admitting: Hematology and Oncology

## 2021-09-01 ENCOUNTER — Telehealth: Payer: Self-pay | Admitting: Hematology and Oncology

## 2021-09-01 NOTE — Telephone Encounter (Signed)
.  Called patient to schedule appointment per 6/6 inbasket, patient is aware of date and time.

## 2021-09-04 ENCOUNTER — Encounter: Payer: Self-pay | Admitting: *Deleted

## 2021-09-04 ENCOUNTER — Other Ambulatory Visit: Payer: Self-pay

## 2021-09-04 ENCOUNTER — Inpatient Hospital Stay: Payer: Commercial Managed Care - PPO | Attending: Hematology and Oncology

## 2021-09-04 VITALS — BP 123/70 | HR 80 | Temp 98.0°F | Resp 18 | Wt 163.0 lb

## 2021-09-04 DIAGNOSIS — Z5111 Encounter for antineoplastic chemotherapy: Secondary | ICD-10-CM | POA: Diagnosis present

## 2021-09-04 DIAGNOSIS — Z79899 Other long term (current) drug therapy: Secondary | ICD-10-CM | POA: Insufficient documentation

## 2021-09-04 DIAGNOSIS — C50411 Malignant neoplasm of upper-outer quadrant of right female breast: Secondary | ICD-10-CM | POA: Diagnosis present

## 2021-09-04 LAB — CBC WITH DIFFERENTIAL/PLATELET
Abs Immature Granulocytes: 0.02 10*3/uL (ref 0.00–0.07)
Basophils Absolute: 0 10*3/uL (ref 0.0–0.1)
Basophils Relative: 0 %
Eosinophils Absolute: 0 10*3/uL (ref 0.0–0.5)
Eosinophils Relative: 0 %
HCT: 29.6 % — ABNORMAL LOW (ref 36.0–46.0)
Hemoglobin: 10.1 g/dL — ABNORMAL LOW (ref 12.0–15.0)
Immature Granulocytes: 1 %
Lymphocytes Relative: 42 %
Lymphs Abs: 1.6 10*3/uL (ref 0.7–4.0)
MCH: 34.6 pg — ABNORMAL HIGH (ref 26.0–34.0)
MCHC: 34.1 g/dL (ref 30.0–36.0)
MCV: 101.4 fL — ABNORMAL HIGH (ref 80.0–100.0)
Monocytes Absolute: 0.4 10*3/uL (ref 0.1–1.0)
Monocytes Relative: 11 %
Neutro Abs: 1.8 10*3/uL (ref 1.7–7.7)
Neutrophils Relative %: 46 %
Platelets: 165 10*3/uL (ref 150–400)
RBC: 2.92 MIL/uL — ABNORMAL LOW (ref 3.87–5.11)
RDW: 17 % — ABNORMAL HIGH (ref 11.5–15.5)
WBC: 3.8 10*3/uL — ABNORMAL LOW (ref 4.0–10.5)
nRBC: 0 % (ref 0.0–0.2)

## 2021-09-04 LAB — COMPREHENSIVE METABOLIC PANEL
ALT: 31 U/L (ref 0–44)
AST: 23 U/L (ref 15–41)
Albumin: 4.1 g/dL (ref 3.5–5.0)
Alkaline Phosphatase: 62 U/L (ref 38–126)
Anion gap: 5 (ref 5–15)
BUN: 16 mg/dL (ref 6–20)
CO2: 27 mmol/L (ref 22–32)
Calcium: 9.4 mg/dL (ref 8.9–10.3)
Chloride: 109 mmol/L (ref 98–111)
Creatinine, Ser: 0.88 mg/dL (ref 0.44–1.00)
GFR, Estimated: >60 mL/min (ref >60)
Glucose, Bld: 106 mg/dL — ABNORMAL HIGH (ref 70–99)
Potassium: 3.8 mmol/L (ref 3.5–5.1)
Sodium: 141 mmol/L (ref 135–145)
Total Bilirubin: 0.3 mg/dL (ref 0.3–1.2)
Total Protein: 6.5 g/dL (ref 6.5–8.1)

## 2021-09-04 MED ORDER — SODIUM CHLORIDE 0.9 % IV SOLN
420.0000 mg | Freq: Once | INTRAVENOUS | Status: AC
  Administered 2021-09-04: 420 mg via INTRAVENOUS
  Filled 2021-09-04: qty 14

## 2021-09-04 MED ORDER — DIPHENHYDRAMINE HCL 25 MG PO CAPS
50.0000 mg | ORAL_CAPSULE | Freq: Once | ORAL | Status: AC
  Administered 2021-09-04: 50 mg via ORAL
  Filled 2021-09-04: qty 2

## 2021-09-04 MED ORDER — SODIUM CHLORIDE 0.9% FLUSH
10.0000 mL | INTRAVENOUS | Status: DC | PRN
  Administered 2021-09-04: 10 mL

## 2021-09-04 MED ORDER — SODIUM CHLORIDE 0.9 % IV SOLN: Freq: Once | INTRAVENOUS | Status: AC

## 2021-09-04 MED ORDER — ACETAMINOPHEN 325 MG PO TABS
650.0000 mg | ORAL_TABLET | Freq: Once | ORAL | Status: AC
  Administered 2021-09-04: 650 mg via ORAL
  Filled 2021-09-04: qty 2

## 2021-09-04 MED ORDER — TRASTUZUMAB-ANNS CHEMO 150 MG IV SOLR
6.0000 mg/kg | Freq: Once | INTRAVENOUS | Status: AC
  Administered 2021-09-04: 420 mg via INTRAVENOUS
  Filled 2021-09-04: qty 20

## 2021-09-04 MED ORDER — HEPARIN SOD (PORK) LOCK FLUSH 100 UNIT/ML IV SOLN
500.0000 [IU] | Freq: Once | INTRAVENOUS | Status: AC | PRN
  Administered 2021-09-04: 500 [IU]

## 2021-09-04 NOTE — Progress Notes (Signed)
Per Dr. Chryl Heck, since patient hadn't had labs since 08/12/21 prior to today's treatment, draw CBC and CMP today

## 2021-09-04 NOTE — Patient Instructions (Signed)
[facility] MEDICAL ONCOLOGY  Discharge Instructions: Thank you for choosing [facility] to provide your oncology and hematology care.   If you have a lab appointment with the Cancer Center, please go directly to the Cancer Center and check in at the registration area.   Wear comfortable clothing and clothing appropriate for easy access to any Portacath or PICC line.   We strive to give you quality time with your provider. You may need to reschedule your appointment if you arrive late (15 or more minutes).  Arriving late affects you and other patients whose appointments are after yours.  Also, if you miss three or more appointments without notifying the office, you may be dismissed from the clinic at the provider's discretion.      For prescription refill requests, have your pharmacy contact our office and allow 72 hours for refills to be completed.    Today you received the following chemotherapy and/or immunotherapy agents; Herceptin & Perjeta      To help prevent nausea and vomiting after your treatment, we encourage you to take your nausea medication as directed.  BELOW ARE SYMPTOMS THAT SHOULD BE REPORTED IMMEDIATELY: *FEVER GREATER THAN 100.4 F (38 C) OR HIGHER *CHILLS OR SWEATING *NAUSEA AND VOMITING THAT IS NOT CONTROLLED WITH YOUR NAUSEA MEDICATION *UNUSUAL SHORTNESS OF BREATH *UNUSUAL BRUISING OR BLEEDING *URINARY PROBLEMS (pain or burning when urinating, or frequent urination) *BOWEL PROBLEMS (unusual diarrhea, constipation, pain near the anus) TENDERNESS IN MOUTH AND THROAT WITH OR WITHOUT PRESENCE OF ULCERS (sore throat, sores in mouth, or a toothache) UNUSUAL RASH, SWELLING OR PAIN  UNUSUAL VAGINAL DISCHARGE OR ITCHING   Items with * indicate a potential emergency and should be followed up as soon as possible or go to the Emergency Department if any problems should occur.  Please show the CHEMOTHERAPY ALERT CARD or IMMUNOTHERAPY ALERT CARD at  check-in to the Emergency Department and triage nurse.  Should you have questions after your visit or need to cancel or reschedule your appointment, please contact [facility]COL[facility]  Dept: [phone]  and follow the prompts.  Office hours are 8:00 a.m. to 4:30 p.m. Monday - Friday. Please note that voicemails left after 4:00 p.m. may not be returned until the following business day.  We are closed weekends and major holidays. You have access to a nurse at all times for urgent questions. Please call the main number to the clinic Dept: [phone] and follow the prompts.   For any non-urgent questions, you may also contact your provider using MyChart. We now offer e-Visits for anyone 18 and older to request care online for non-urgent symptoms. For details visit [email or web address].   Also download the MyChart app! Go to the app store, search "MyChart", open the app, select [facility], and log in with your MyChart username and password.  Due to Covid, a mask is required upon entering the hospital/clinic. If you do not have a mask, one will be given to you upon arrival. For doctor visits, patients may have 1 support Stellah Donovan aged 32 or older with them. For treatment visits, patients cannot have anyone with them due to current Covid guidelines and our immunocompromised population.  

## 2021-09-07 ENCOUNTER — Telehealth: Payer: Self-pay | Admitting: *Deleted

## 2021-09-07 ENCOUNTER — Other Ambulatory Visit: Payer: Self-pay | Admitting: Adult Health

## 2021-09-07 MED ORDER — DIPHENOXYLATE-ATROPINE 2.5-0.025 MG PO TABS: 1.0000 | ORAL_TABLET | Freq: Four times a day (QID) | ORAL | 0 refills | Status: DC | PRN

## 2021-09-07 NOTE — Telephone Encounter (Signed)
Received call from pt with complaint of ongoing diarrhea not alleviated by OTC imodium.  Pt states she is able to drink and eat without any difficulties and denies s/s of dehydration.  Per NP pt to be prescribed Lomotil PRN and alert our office tomorrow if symptoms do not improve.  If symptoms still present, pt needing to be seen in [facility].  Pt educated and verbalized understanding.

## 2021-09-07 NOTE — Telephone Encounter (Signed)
Received after hours message from pt regarding ongoing diarrhea despite OTC imodium.  RN attempt x 1 to return call.  No answer, LVM for pt to return call to the office.

## 2021-09-07 NOTE — Progress Notes (Signed)
Patient is experiencing increased diarrhea after receiving Herceptin Perjeta.  She is managing her diarrhea well and has chronic diarrhea with this medication.  She has not been having much success with Imodium therefore we are sending in Lomotil.  If this does not help the diarrhea she is to come in for follow-up and evaluation so we can send off stool studies and identify a other etiologies.  She has kept up with her fluids and is a good historian.    Wilber Bihari, NP 09/07/21 3:51 PM Medical Oncology and Hematology [facility] [place], [place] [address] Tel. [phone]    Fax. [phone]  *Total Encounter Time as defined by the Centers for Medicare and Medicaid Services includes, in

## 2021-09-08 ENCOUNTER — Encounter: Payer: Self-pay | Admitting: Hematology and Oncology

## 2021-09-23 ENCOUNTER — Ambulatory Visit: Payer: Commercial Managed Care - PPO

## 2021-09-23 ENCOUNTER — Other Ambulatory Visit: Payer: Commercial Managed Care - PPO

## 2021-09-23 ENCOUNTER — Ambulatory Visit: Payer: Commercial Managed Care - PPO | Admitting: Hematology and Oncology

## 2021-09-24 ENCOUNTER — Other Ambulatory Visit: Payer: Self-pay | Admitting: Hematology and Oncology

## 2021-09-25 ENCOUNTER — Ambulatory Visit: Payer: Commercial Managed Care - PPO | Admitting: Hematology and Oncology

## 2021-09-25 ENCOUNTER — Ambulatory Visit: Payer: Commercial Managed Care - PPO

## 2021-09-25 ENCOUNTER — Other Ambulatory Visit: Payer: Commercial Managed Care - PPO

## 2021-09-30 NOTE — H&P (Signed)
Subjective:     Patient ID: Tara White is a 31 y.o. female.   HPI   Returns for follow up discussion breast reconstruction. Presented right palpable right breast mass first noted during pregnancy and eczematous changes to nipple for over year duration. Diagnostic MMG/US showed right breast a 2.1 cm mass at 10:00, 4 cmfn, multiple small satellite masses, calcifications from within nipple to posterior depth spanning 10.8 cm, and an enlarged right axillary LN. Biopsy of mass showed IDC, ER/PR-, Her2+ with LN positive for metastatic disease   MRI demonstrated NME throughout all quadrants of the RIGHT breast, with largest confluent area of enhancement measuring 2.9 cm. 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 A 1.9 cm right axillary LN biopsy clip noted.   Staging scans negative.   Completed neoadjuvant chemotherapy. Final MRI demonstrated no abnormal enhancement identified in the right breast; normalization of metastatic right axillary LN. Given span of disease mastectomy planned. She will receive adjuvant radiation.   Genetics negative. MGM with breast ca age 31.   Current 36/8 A. With pregnancies was C cup. Goal B or C cup. Wt stable.   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    Lives with spouse and kids ages 1,3,5.   Review of Systems Remainder 12 point review negative    Objective:   Physical Exam Cardiovascular:     Rate and Rhythm: Normal rate and regular rhythm.     Heart sounds: Normal heart sounds.  Pulmonary:     Effort: Pulmonary effort is normal.     Breath sounds: Normal breath sounds.  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 Ninfa Linden timing of holding Xarelto- counseled this will be 2-3 d prior to surgery. Reviewed anticoagulation will increase her risks of bleeding complication.    Plan right SSM with immediate tissue expander acellular dermis reconstruction. Discussed anchor type scar.    Reviewed drains, OR length, hospital stay and recovery, limitations. Discussed process of expansion and implant based risks including rupture, imaging surveillance for silicone implants, infection requiring surgery or removal, contracture. Reviewed risks mastectomy flap necrosis requiring additional surgery. Reviewed implants are not permanent devices and will require additional surgery.  Counseled expander placement at time of mastectomy does not preclude her from autologous reconstruction in future. Reviewed reconstruction will be asensate.    Discussed use of acellular dermis in reconstruction, cadaveric source, incorporation over several weeks, risk that if has seroma or infection can act as additional nidus for infection if not incorporated.    Discussed prepectoral vs sub pectoral reconstruction. Discussed with patient and benefit of this is no animation deformity, may be less pain. Risk may be more visible rippling over upper poles, greater need of ADM. Reviewed pre pectoral would require larger amount acellular dermis, more drains. Discussed any type reconstruction also risks long term displacement implant and visible rippling. If prepectoral counseled I would recommend she be comfortable with silicone implants as more options that  have less rippling. She agrees to prepectoral placement.   Patient may be interested in purely autologous reconstruction canceled appt with Plastics at [facility]. Reviewed with patient this type reconstruction would not be offered until after RT complete and RT to flap would lose a lot of  aesthetic benefits of purely autologous. Immediate TE offers preservation of breast footprint and keeps her from being completely flat. Reviewed not necessary to have TE to get to purely autologous reconstruction. However if no TE placed at time of mastectomy, then TE alone post RT unlikely to adequeately expand and would need some type flap such as LD with TE placement to achieve implant based reconstruction. Reviewed this would then mean expander in place for several months (approximately 3-6 months from end of radiation) before continuing reconstruction process. At that time could do implant exchange alone, implant exchange with LD flap for radiated chest, or conversion to autologous.    Reviewed additional risks including but not limited to risks mastectomy flap necrosis requiring additional surgery, seroma, hematoma, asymmetry, need to additional procedures, fat necrosis, DVT/PE, damage to adjacent structures, cardiopulmonary complications.   Drain teaching completed. Rx for oxycodone robaxin and Bactrim given.

## 2021-10-01 ENCOUNTER — Encounter (HOSPITAL_BASED_OUTPATIENT_CLINIC_OR_DEPARTMENT_OTHER): Payer: Self-pay | Admitting: Surgery

## 2021-10-01 ENCOUNTER — Other Ambulatory Visit: Payer: Self-pay

## 2021-10-02 ENCOUNTER — Inpatient Hospital Stay: Payer: Commercial Managed Care - PPO | Attending: Hematology and Oncology

## 2021-10-02 ENCOUNTER — Inpatient Hospital Stay: Payer: Commercial Managed Care - PPO

## 2021-10-02 ENCOUNTER — Inpatient Hospital Stay (HOSPITAL_BASED_OUTPATIENT_CLINIC_OR_DEPARTMENT_OTHER): Payer: Commercial Managed Care - PPO | Admitting: Hematology and Oncology

## 2021-10-02 ENCOUNTER — Encounter: Payer: Self-pay | Admitting: *Deleted

## 2021-10-02 VITALS — BP 122/69 | HR 64 | Resp 18

## 2021-10-02 DIAGNOSIS — C50411 Malignant neoplasm of upper-outer quadrant of right female breast: Secondary | ICD-10-CM

## 2021-10-02 DIAGNOSIS — Z79899 Other long term (current) drug therapy: Secondary | ICD-10-CM | POA: Diagnosis not present

## 2021-10-02 DIAGNOSIS — Z9011 Acquired absence of right breast and nipple: Secondary | ICD-10-CM | POA: Insufficient documentation

## 2021-10-02 DIAGNOSIS — Z95828 Presence of other vascular implants and grafts: Secondary | ICD-10-CM

## 2021-10-02 DIAGNOSIS — Z171 Estrogen receptor negative status [ER-]: Secondary | ICD-10-CM | POA: Insufficient documentation

## 2021-10-02 DIAGNOSIS — Z5111 Encounter for antineoplastic chemotherapy: Secondary | ICD-10-CM | POA: Insufficient documentation

## 2021-10-02 LAB — CBC WITH DIFFERENTIAL (CANCER CENTER ONLY)
Abs Immature Granulocytes: 0.02 10*3/uL (ref 0.00–0.07)
Basophils Absolute: 0 10*3/uL (ref 0.0–0.1)
Basophils Relative: 0 %
Eosinophils Absolute: 0.3 10*3/uL (ref 0.0–0.5)
Eosinophils Relative: 5 %
HCT: 33.1 % — ABNORMAL LOW (ref 36.0–46.0)
Hemoglobin: 11.6 g/dL — ABNORMAL LOW (ref 12.0–15.0)
Immature Granulocytes: 0 %
Lymphocytes Relative: 32 %
Lymphs Abs: 2.1 10*3/uL (ref 0.7–4.0)
MCH: 33.3 pg (ref 26.0–34.0)
MCHC: 35 g/dL (ref 30.0–36.0)
MCV: 95.1 fL (ref 80.0–100.0)
Monocytes Absolute: 0.4 10*3/uL (ref 0.1–1.0)
Monocytes Relative: 6 %
Neutro Abs: 3.8 10*3/uL (ref 1.7–7.7)
Neutrophils Relative %: 57 %
Platelet Count: 174 10*3/uL (ref 150–400)
RBC: 3.48 MIL/uL — ABNORMAL LOW (ref 3.87–5.11)
RDW: 13.2 % (ref 11.5–15.5)
WBC Count: 6.6 10*3/uL (ref 4.0–10.5)
nRBC: 0 % (ref 0.0–0.2)

## 2021-10-02 LAB — CMP (CANCER CENTER ONLY)
ALT: 14 U/L (ref 0–44)
AST: 17 U/L (ref 15–41)
Albumin: 4.2 g/dL (ref 3.5–5.0)
Alkaline Phosphatase: 70 U/L (ref 38–126)
Anion gap: 6 (ref 5–15)
BUN: 21 mg/dL — ABNORMAL HIGH (ref 6–20)
CO2: 26 mmol/L (ref 22–32)
Calcium: 9.4 mg/dL (ref 8.9–10.3)
Chloride: 107 mmol/L (ref 98–111)
Creatinine: 0.9 mg/dL (ref 0.44–1.00)
GFR, Estimated: >60 mL/min (ref >60)
Glucose, Bld: 82 mg/dL (ref 70–99)
Potassium: 3.9 mmol/L (ref 3.5–5.1)
Sodium: 139 mmol/L (ref 135–145)
Total Bilirubin: 0.5 mg/dL (ref 0.3–1.2)
Total Protein: 7.2 g/dL (ref 6.5–8.1)

## 2021-10-02 MED ORDER — HEPARIN SOD (PORK) LOCK FLUSH 100 UNIT/ML IV SOLN
500.0000 [IU] | Freq: Once | INTRAVENOUS | Status: AC | PRN
  Administered 2021-10-02: 500 [IU]

## 2021-10-02 MED ORDER — SODIUM CHLORIDE 0.9% FLUSH
10.0000 mL | INTRAVENOUS | Status: DC | PRN
  Administered 2021-10-02: 10 mL

## 2021-10-02 MED ORDER — SODIUM CHLORIDE 0.9% FLUSH: 10.0000 mL | Freq: Once | INTRAVENOUS | Status: DC

## 2021-10-02 MED ORDER — SODIUM CHLORIDE 0.9 % IV SOLN
420.0000 mg | Freq: Once | INTRAVENOUS | Status: AC
  Administered 2021-10-02: 420 mg via INTRAVENOUS
  Filled 2021-10-02: qty 14

## 2021-10-02 MED ORDER — TRASTUZUMAB-ANNS CHEMO 150 MG IV SOLR
6.0000 mg/kg | Freq: Once | INTRAVENOUS | Status: AC
  Administered 2021-10-02: 420 mg via INTRAVENOUS
  Filled 2021-10-02: qty 20

## 2021-10-02 MED ORDER — ACETAMINOPHEN 325 MG PO TABS
650.0000 mg | ORAL_TABLET | Freq: Once | ORAL | Status: AC
  Administered 2021-10-02: 650 mg via ORAL
  Filled 2021-10-02: qty 2

## 2021-10-02 MED ORDER — SODIUM CHLORIDE 0.9 % IV SOLN: Freq: Once | INTRAVENOUS | Status: AC

## 2021-10-02 MED ORDER — DIPHENHYDRAMINE HCL 25 MG PO CAPS
50.0000 mg | ORAL_CAPSULE | Freq: Once | ORAL | Status: AC
  Administered 2021-10-02: 50 mg via ORAL
  Filled 2021-10-02: qty 2

## 2021-10-02 NOTE — Assessment & Plan Note (Signed)
04/08/2021:History of right breast eczematous change to the nipple, patient had palpable lump in the right breast her mammogram 2.1 cm spiculated mass with extensive calcifications that span 11 cm. Ultrasound revealed 3 cm mass at 10:00 plus small satellite lesions. Biopsy revealed grade 3 IDC ER 0%, PR 0%, HER2 3+ positive, Ki-67 30%  Recommendation: 1. Neoadjuvant chemotherapy with TCH Perjeta 6 cycles completed 08/12/2021 followed by Herceptin Perjeta maintenance versus Kadcyla maintenance (based on response to neoadjuvant chemo) for 1 year 2. Followed bymastectomy with reconstruction 3. Followed by adjuvant radiation therapy ----------------------------------------------------------------------------------------------------------------------------------------------- Breast MRI 08/13/2021: No abnormal enhancement identified in the right breast.  Normalization of the right axillary lymph node now measuring 0.8 cm.

## 2021-10-02 NOTE — Progress Notes (Signed)
Patient Care Team: Group, [facility] as PCP - Philomena Doheny, Paulette Blanch, RN as Oncology Nurse Navigator Rockwell Germany, RN as Oncology Nurse Navigator Nicholas Lose, MD as Consulting Physician (Hematology and Oncology) Coralie Keens, MD as Consulting Physician (General Surgery) Eppie Gibson, MD as Attending Physician (Radiation Oncology)  DIAGNOSIS:  Encounter Diagnosis  Name Primary?   Malignant neoplasm of upper-outer quadrant of right breast in female, estrogen receptor negative ([place] [place])     SUMMARY OF ONCOLOGIC HISTORY: Oncology History  Malignant neoplasm of upper-outer quadrant of right breast in female, estrogen receptor negative ([place])  04/08/2021 Initial Diagnosis   History of right breast eczematous change to the nipple, patient had palpable lump in the right breast her mammogram 2.1 cm spiculated mass with extensive calcifications that span 11 cm.  Ultrasound revealed 3 cm mass at 10:00 plus small satellite lesions.  Biopsy revealed grade 3 IDC ER 0%, PR 0%, HER2 3+ positive, Ki-67 30%   04/08/2021 Cancer Staging   Staging form: Breast, AJCC 8th Edition - Clinical stage from 04/08/2021: Stage IIB (cT2, cN1, cM0, G3, ER-, PR-, HER2+) - Signed by Nicholas Lose, MD on 04/15/2021 Stage prefix: Initial diagnosis Method of lymph node assessment: Axillary lymph node dissection Histologic grading system: 3 grade system   05/01/2021 -  Chemotherapy   Patient is on Treatment Plan : BREAST  Docetaxel + Carboplatin + Trastuzumab + Pertuzumab  (TCHP) q21d      05/21/2021 Genetic Testing   Negative hereditary cancer genetic testing: no pathogenic variants detected in [facility] CustomNext-Cancer +RNAinsight Panel.  Report date is 05/21/2021.   The CustomNext-Cancer+RNAinsight panel offered by [facility] includes sequencing and rearrangement analysis for the following 47 genes:  APC, ATM, AXIN2, BARD1, BMPR1A, BRCA1, BRCA2, BRIP1, CDH1, CDK4, CDKN2A, CHEK2, DICER1, EPCAM, GREM1,  HOXB13, MEN1, MLH1, MSH2, MSH3, MSH6, MUTYH, NBN, NF1, NF2, NTHL1, PALB2, PMS2, POLD1, POLE, PTEN, RAD51C, RAD51D, RECQL, RET, SDHA, SDHAF2, SDHB, SDHC, SDHD, SMAD4, SMARCA4, STK11, TP53, TSC1, TSC2, and VHL.  RNA data is routinely analyzed for use in variant interpretation for all genes.     CHIEF COMPLIANT: Follow-up Herceptin prejeta  INTERVAL HISTORY: Tara White is a  a 31 y.o with the above mention. She presents to the clinic today for follow-up. She states that her muscles has recovered and her energy levels is back. She did have some severe diarrhea every 2 hrs at night. She had some manageable nausea but she took a Zofran.   ALLERGIES:  has No Known Allergies.  MEDICATIONS:  Current Outpatient Medications  Medication Sig Dispense Refill   chlorpheniramine-HYDROcodone 10-8 MG/5ML hydrocodone 10 mg-chlorpheniramine 8 mg/5 mL oral susp extend.rel 12hr  Take 5 mL every 12 hours by oral route as needed.     clindamycin (CLINDAGEL) 1 % gel clindamycin 1 % topical gel  APPLY TOPICALLY TWICE A DAY     diphenoxylate-atropine (LOMOTIL) 2.5-0.025 MG tablet Take 1 tablet by mouth 4 (four) times daily as needed for diarrhea or loose stools. 30 tablet 0   Homeopathic Products (FRANKINCENSE UPLIFTING) OIL Apply 2 drops topically.     Lactobacillus Rhamnosus, GG, (CULTURELLE) CAPS Take 1 capsule by mouth daily.     lidocaine-prilocaine (EMLA) cream Apply to affected area once 30 g 3   LORazepam (ATIVAN) 0.5 MG tablet Take 1 tablet (0.5 mg total) by mouth at bedtime as needed (Nausea or vomiting). 30 tablet 0   magic mouthwash w/lidocaine SOLN Take 5 mLs by mouth 4 (four) times daily as needed  for mouth pain. 240 mL 1   ondansetron (ZOFRAN) 8 MG tablet Take 1 tablet (8 mg total) by mouth 2 (two) times daily as needed (Nausea or vomiting). Start on the third day after chemotherapy. 30 tablet 1   PROBIOTIC PRODUCT PO Take by mouth.     prochlorperazine (COMPAZINE) 10 MG tablet Take 1 tablet  (10 mg total) by mouth every 6 (six) hours as needed (Nausea or vomiting). 30 tablet 1   rivaroxaban (XARELTO) 20 MG TABS tablet Take 1 tablet (20 mg total) by mouth daily with supper. 30 tablet 6   No current facility-administered medications for this visit.   Facility-Administered Medications Ordered in Other Visits  Medication Dose Route Frequency Provider Last Rate Last Admin   heparin lock flush 100 unit/mL  500 Units Intracatheter Once PRN Nicholas Lose, MD       pertuzumab (PERJETA) 420 mg in sodium chloride 0.9 % 250 mL chemo infusion  420 mg Intravenous Once Nicholas Lose, MD       sodium chloride flush (NS) 0.9 % injection 10 mL  10 mL Intracatheter PRN Nicholas Lose, MD       trastuzumab-anns (KANJINTI) 420 mg in sodium chloride 0.9 % 250 mL chemo infusion  6 mg/kg (Treatment Plan Recorded) Intravenous Once Nicholas Lose, MD        PHYSICAL EXAMINATION: ECOG PERFORMANCE STATUS: 1 - Symptomatic but completely ambulatory  Vitals:   10/02/21 1153  BP: 130/78  Pulse: 88  Resp: 16  Temp: 97.8 F (36.6 C)  SpO2: 100%   Filed Weights   10/02/21 1153  Weight: 158 lb 1.6 oz (71.7 kg)      LABORATORY DATA:  I have reviewed the data as listed    Latest Ref Rng & Units 10/02/2021   11:46 AM 09/04/2021   10:55 AM 08/12/2021    8:51 AM  CMP  Glucose 70 - 99 mg/dL 82  106  125   BUN 6 - 20 mg/dL _0 Creatinine 0.44 - 1.00 mg/dL 0.90  0.88  0.80   Sodium 135 - 145 mmol/L 139  141  140   Potassium 3.5 - 5.1 mmol/L 3.9  3.8  3.9   Chloride 98 - 111 mmol/L 107  109  109   CO2 22 - 32 mmol/L _1 Calcium 8.9 - 10.3 mg/dL 9.4  9.4  8.8   Total Protein 6.5 - 8.1 g/dL 7.2  6.5  6.2   Total Bilirubin 0.3 - 1.2 mg/dL 0.5  0.3  0.3   Alkaline Phos 38 - 126 U/L 70  62  82   AST 15 - 41 U/L _2 ALT 0 - 44 U/L _3 Lab Results  Component Value Date   WBC 6.6 10/02/2021   HGB 11.6 (L) 10/02/2021   HCT 33.1 (L) 10/02/2021   MCV 95.1 10/02/2021    PLT 174 10/02/2021   NEUTROABS 3.8 10/02/2021    ASSESSMENT & PLAN:  Malignant neoplasm of upper-outer quadrant of right breast in female, estrogen receptor negative ([place]) 04/08/2021:History of right breast eczematous change to the nipple, patient had palpable lump in the right breast her mammogram 2.1 cm spiculated mass with extensive calcifications that span 11 cm.  Ultrasound revealed 3 cm mass at 10:00 plus small satellite lesions.  Biopsy revealed grade 3 IDC ER 0%, PR 0%, HER2 3+ positive, Ki-67  30%   Recommendation: 1. Neoadjuvant chemotherapy with TCH Perjeta 6 cycles completed 08/12/2021 followed by Herceptin Perjeta maintenance versus Kadcyla maintenance (based on response to neoadjuvant chemo) for 1 year 2. Followed by mastectomy with reconstruction 3. Followed by adjuvant radiation therapy   ----------------------------------------------------------------------------------------------------------------------------------------------- Breast MRI 08/13/2021: No abnormal enhancement identified in the right breast.  Normalization of the right axillary lymph node now measuring 0.8 cm. Diarrhea from Perjeta: We prescribed her Lomotil and it appears to have helped with the last treatment.  She will take it as soon as she gets diarrhea at this time. Nausea as well.  Return to clinic after surgery to discuss the final pathology report.  This will determine her adjuvant treatment plan between Kadcyla versus Herceptin Perjeta.   No orders of the defined types were placed in this encounter.  The patient has a good understanding of the overall plan. she agrees with it. she will call with any problems that may develop before the next visit here. Total time spent: 30 mins including face to face time and time spent for planning, charting and co-ordination of care   Harriette Ohara, MD 10/02/21    I Gardiner Coins am scribing for Dr. Lindi Adie  I have reviewed the above documentation for  accuracy and completeness, and I agree with the above.

## 2021-10-02 NOTE — Patient Instructions (Signed)
[facility] ONCOLOGY  Discharge Instructions: Thank you for choosing [facility] to provide your oncology and hematology care.   If you have a lab appointment with the [facility], please go directly to the [facility] and check in at the registration area.   Wear comfortable clothing and clothing appropriate for easy access to any Portacath or PICC line.   We strive to give you quality time with your provider. You may need to reschedule your appointment if you arrive late (15 or more minutes).  Arriving late affects you and other patients whose appointments are after yours.  Also, if you miss three or more appointments without notifying the office, you may be dismissed from the clinic at the provider's discretion.      For prescription refill requests, have your pharmacy contact our office and allow 72 hours for refills to be completed.    Today you received the following chemotherapy and/or immunotherapy agents; Herceptin & Perjeta      To help prevent nausea and vomiting after your treatment, we encourage you to take your nausea medication as directed.  BELOW ARE SYMPTOMS THAT SHOULD BE REPORTED IMMEDIATELY: *FEVER GREATER THAN 100.4 F (38 C) OR HIGHER *CHILLS OR SWEATING *NAUSEA AND VOMITING THAT IS NOT CONTROLLED WITH YOUR NAUSEA MEDICATION *UNUSUAL SHORTNESS OF BREATH *UNUSUAL BRUISING OR BLEEDING *URINARY PROBLEMS (pain or burning when urinating, or frequent urination) *BOWEL PROBLEMS (unusual diarrhea, constipation, pain near the anus) TENDERNESS IN MOUTH AND THROAT WITH OR WITHOUT PRESENCE OF ULCERS (sore throat, sores in mouth, or a toothache) UNUSUAL RASH, SWELLING OR PAIN  UNUSUAL VAGINAL DISCHARGE OR ITCHING   Items with * indicate a potential emergency and should be followed up as soon as possible or go to the Emergency Department if any problems should occur.  Please show the CHEMOTHERAPY ALERT CARD or IMMUNOTHERAPY ALERT CARD at  check-in to the Emergency Department and triage nurse.  Should you have questions after your visit or need to cancel or reschedule your appointment, please contact [facility]  Dept: [phone]  and follow the prompts.  Office hours are 8:00 a.m. to 4:30 p.m. Monday - Friday. Please note that voicemails left after 4:00 p.m. may not be returned until the following business day.  We are closed weekends and major holidays. You have access to a nurse at all times for urgent questions. Please call the main number to the clinic Dept: [phone] and follow the prompts.   For any non-urgent questions, you may also contact your provider using MyChart. We now offer e-Visits for anyone 71 and older to request care online for non-urgent symptoms. For details visit [email or web address].   Also download the MyChart app! Go to the app store, search "MyChart", open the app, select [facility], and log in with your MyChart username and password.  Due to Covid, a mask is required upon entering the hospital/clinic. If you do not have a mask, one will be given to you upon arrival. For doctor visits, patients may have 1 support person aged 40 or older with them. For treatment visits, patients cannot have anyone with them due to current Covid guidelines and our immunocompromised population.

## 2021-10-05 ENCOUNTER — Telehealth: Payer: Self-pay | Admitting: Hematology and Oncology

## 2021-10-05 NOTE — Telephone Encounter (Signed)
Scheduled appointment per 7/7 los. Left voicemail.

## 2021-10-07 ENCOUNTER — Other Ambulatory Visit: Payer: Self-pay | Admitting: Surgery

## 2021-10-07 DIAGNOSIS — Z853 Personal history of malignant neoplasm of breast: Secondary | ICD-10-CM

## 2021-10-12 ENCOUNTER — Other Ambulatory Visit: Payer: Self-pay | Admitting: Surgery

## 2021-10-12 DIAGNOSIS — Z853 Personal history of malignant neoplasm of breast: Secondary | ICD-10-CM

## 2021-10-12 NOTE — Anesthesia Preprocedure Evaluation (Addendum)
Anesthesia Evaluation  Patient identified by MRN, date of birth, ID band Patient awake    Reviewed: Allergy & Precautions, NPO status , Patient's Chart, lab work & pertinent test results  Airway Mallampati: II  TM Distance: >3 FB Neck ROM: Full    Dental no notable dental hx. (+) Teeth Intact, Dental Advisory Given   Pulmonary neg pulmonary ROS,    Pulmonary exam normal breath sounds clear to auscultation       Cardiovascular + DVT  Normal cardiovascular exam Rhythm:Regular Rate:Normal     Neuro/Psych negative neurological ROS  negative psych ROS   GI/Hepatic negative GI ROS, Neg liver ROS,   Endo/Other  negative endocrine ROS  Renal/GU negative Renal ROS  negative genitourinary   Musculoskeletal negative musculoskeletal ROS (+)   Abdominal   Peds  Hematology  (+) Blood dyscrasia (on xarelto), ,   Anesthesia Other Findings   Reproductive/Obstetrics                            Anesthesia Physical Anesthesia Plan  ASA: 2  Anesthesia Plan: General and Regional   Post-op Pain Management: Regional block*, Tylenol PO (pre-op)* and Ketamine IV*   Induction: Intravenous  PONV Risk Score and Plan: 3 and Ondansetron, Dexamethasone and Midazolam  Airway Management Planned: Video Laryngoscope Planned and Oral ETT  Additional Equipment:   Intra-op Plan:   Post-operative Plan: Extubation in OR  Informed Consent: I have reviewed the patients History and Physical, chart, labs and discussed the procedure including the risks, benefits and alternatives for the proposed anesthesia with the patient or authorized representative who has indicated his/her understanding and acceptance.     Dental advisory given  Plan Discussed with: CRNA  Anesthesia Plan Comments:        Anesthesia Quick Evaluation

## 2021-10-12 NOTE — H&P (Signed)
Tara White is an 31 y.o. female.   Chief Complaint: Right breast cancer HPI: This is a 31 year old female initially presented with a palpable right breast mass during her pregnancy she had an area on the nipple that was thought to be eczema, she eventually had mammograms and ultrasound which showed a 3 cm mass in the right breast.  There was axillary adenopathy as well.  She had extensive calcifications measuring up to 10 cm x 10 cm at 7.5 cm.  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  Biopsy of the lymph node in the axilla was positive for metastatic cancer as well.  A Port-A-Cath was inserted and she underwent neoadjuvant therapy.  After neoadjuvant therapy she underwent MRI showing a complete resolution of the non-mass-like enhancement and no other abnormalities in the breast.  The lymph node in the axilla had decreased from 1.9 cm to 2.8 cm in size.  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   Family history of breast cancer 04/21/2021   right breast ca 03/2021    Past Surgical History:  Procedure Laterality Date   CESAREAN SECTION N/A 12/10/2015   Procedure: CESAREAN SECTION;  Surgeon: Aloha Gell, MD;  Location: [facility] [facility];  Service: Obstetrics;  Laterality: N/A;   IR CV LINE INJECTION  06/25/2021   IR RADIOLOGY PERIPHERAL GUIDED IV START  06/25/2021   IR US GUIDE VASC ACCESS LEFT  06/25/2021   IR VENO/EXT/UNI LEFT  06/25/2021   PORTACATH PLACEMENT Left 04/30/2021   Procedure: INSERTION PORT-A-CATH;  Surgeon: Coralie Keens, MD;  Location: [facility];  Service: General;  Laterality: Left;   WISDOM TOOTH EXTRACTION      Family History  Problem Relation Age of Onset   Hypothyroidism Mother    Breast cancer Maternal Grandmother 20   Skin cancer  Maternal Grandfather    Heart failure Paternal Grandfather    Social History:  reports that she has never smoked. She has never used smokeless tobacco. She reports that she does not currently use alcohol after a past usage of about 2.0 standard drinks of alcohol per week. She reports that she does not use drugs.  Allergies: No Known Allergies  No medications prior to admission.    No results found for this or any previous visit (from the past 48 hour(s)). No results found.  Review of Systems  All other systems reviewed and are negative.   Height 5' 9"  (1.753 m), weight 74 kg, unknown if currently breastfeeding. Physical Exam Constitutional:      Appearance: Normal appearance.  HENT:     Head: Normocephalic and atraumatic.     Right Ear: External ear normal.     Left Ear: External ear normal.     Nose: Nose normal.  Cardiovascular:     Rate and Rhythm: Normal rate and regular rhythm.  Pulmonary:     Effort: Pulmonary effort is normal.     Breath sounds: Normal breath sounds.  Abdominal:     General: Abdomen is flat.     Tenderness: There is no abdominal tenderness.  Musculoskeletal:        General: Normal range of motion.     Cervical back: Normal range of motion.  Skin:    General: Skin is warm  and dry.  Neurological:     General: No focal deficit present.     Mental Status: She is alert and oriented to person, place, and time.  Psychiatric:        Mood and Affect: Mood normal.        Behavior: Behavior normal.   Her right breast now looks completely normal physical examination.  There are now no abnormalities of her nipple areolar complex, no palpable breast masses, and no enlarged lymph node in her axilla  Assessment/Plan Right breast cancer with positive right axillary lymph node   She has now completed neoadjuvant therapy and is ready to proceed with definitive surgery.  She has seen Dr. Iran Planas from plastic surgery as well.  The plan will now be to proceed  with a right mastectomy and targeted lymph node dissection followed by immediate reconstruction.  We have discussed this with her in detail.  We have discussed the risk which includes but is not limited to bleeding, infection, injury to surrounding structures, the need for further procedures, cardiopulmonary issues, postoperative recovery, etc.  She understands and agrees to proceed with surgery  Coralie Keens, MD 10/12/2021, 4:22 PM

## 2021-10-13 ENCOUNTER — Encounter (HOSPITAL_BASED_OUTPATIENT_CLINIC_OR_DEPARTMENT_OTHER): Payer: Self-pay | Admitting: Surgery

## 2021-10-13 ENCOUNTER — Ambulatory Visit (HOSPITAL_BASED_OUTPATIENT_CLINIC_OR_DEPARTMENT_OTHER): Payer: Commercial Managed Care - PPO | Admitting: Anesthesiology

## 2021-10-13 ENCOUNTER — Other Ambulatory Visit: Payer: Self-pay

## 2021-10-13 ENCOUNTER — Encounter (HOSPITAL_BASED_OUTPATIENT_CLINIC_OR_DEPARTMENT_OTHER)
Admission: RE | Disposition: A | Discharge status: Home / Self Care | Payer: Self-pay | Source: Ambulatory Visit | Attending: Plastic Surgery

## 2021-10-13 ENCOUNTER — Ambulatory Visit (HOSPITAL_BASED_OUTPATIENT_CLINIC_OR_DEPARTMENT_OTHER)
Admission: RE | Admit: 2021-10-13 | Discharge: 2021-10-13 | Disposition: A | Discharge status: Home / Self Care | Payer: Commercial Managed Care - PPO | Source: Ambulatory Visit | Attending: Plastic Surgery | Admitting: Plastic Surgery

## 2021-10-13 DIAGNOSIS — Z9221 Personal history of antineoplastic chemotherapy: Secondary | ICD-10-CM | POA: Diagnosis not present

## 2021-10-13 DIAGNOSIS — C50911 Malignant neoplasm of unspecified site of right female breast: Secondary | ICD-10-CM | POA: Diagnosis present

## 2021-10-13 DIAGNOSIS — Z86718 Personal history of other venous thrombosis and embolism: Secondary | ICD-10-CM | POA: Insufficient documentation

## 2021-10-13 DIAGNOSIS — Z7901 Long term (current) use of anticoagulants: Secondary | ICD-10-CM | POA: Insufficient documentation

## 2021-10-13 DIAGNOSIS — C50411 Malignant neoplasm of upper-outer quadrant of right female breast: Secondary | ICD-10-CM

## 2021-10-13 DIAGNOSIS — Z171 Estrogen receptor negative status [ER-]: Secondary | ICD-10-CM

## 2021-10-13 DIAGNOSIS — Z803 Family history of malignant neoplasm of breast: Secondary | ICD-10-CM | POA: Diagnosis not present

## 2021-10-13 DIAGNOSIS — Z01818 Encounter for other preprocedural examination: Secondary | ICD-10-CM

## 2021-10-13 DIAGNOSIS — C773 Secondary and unspecified malignant neoplasm of axilla and upper limb lymph nodes: Secondary | ICD-10-CM | POA: Diagnosis not present

## 2021-10-13 DIAGNOSIS — D759 Disease of blood and blood-forming organs, unspecified: Secondary | ICD-10-CM | POA: Diagnosis not present

## 2021-10-13 HISTORY — PX: BREAST RECONSTRUCTION WITH PLACEMENT OF TISSUE EXPANDER AND ALLODERM: SHX6805

## 2021-10-13 HISTORY — PX: SIMPLE MASTECTOMY WITH AXILLARY SENTINEL NODE BIOPSY: SHX6098

## 2021-10-13 HISTORY — PX: NODE DISSECTION: SHX5269

## 2021-10-13 LAB — POCT PREGNANCY, URINE: Preg Test, Ur: NEGATIVE

## 2021-10-13 SURGERY — SIMPLE MASTECTOMY
Anesthesia: Regional | Site: Chest | Laterality: Right

## 2021-10-13 MED ORDER — POVIDONE-IODINE 10 % EX SOLN
CUTANEOUS | Status: DC | PRN
  Administered 2021-10-13: 1 via TOPICAL

## 2021-10-13 MED ORDER — BUPIVACAINE-EPINEPHRINE (PF) 0.5% -1:200000 IJ SOLN
INTRAMUSCULAR | Status: AC
  Filled 2021-10-13: qty 30

## 2021-10-13 MED ORDER — PROPOFOL 10 MG/ML IV BOLUS
INTRAVENOUS | Status: DC | PRN
  Administered 2021-10-13: 150 mg via INTRAVENOUS

## 2021-10-13 MED ORDER — CEFAZOLIN SODIUM-DEXTROSE 2-4 GM/100ML-% IV SOLN
INTRAVENOUS | Status: AC
  Filled 2021-10-13: qty 100

## 2021-10-13 MED ORDER — 0.9 % SODIUM CHLORIDE (POUR BTL) OPTIME
TOPICAL | Status: DC | PRN
  Administered 2021-10-13: 1000 mL

## 2021-10-13 MED ORDER — LIDOCAINE HCL (CARDIAC) PF 100 MG/5ML IV SOSY
PREFILLED_SYRINGE | INTRAVENOUS | Status: DC | PRN
  Administered 2021-10-13: 20 mg via INTRAVENOUS

## 2021-10-13 MED ORDER — CHLORHEXIDINE GLUCONATE CLOTH 2 % EX PADS: 6.0000 | MEDICATED_PAD | Freq: Once | CUTANEOUS | Status: DC

## 2021-10-13 MED ORDER — KETAMINE HCL 50 MG/5ML IJ SOSY
PREFILLED_SYRINGE | INTRAMUSCULAR | Status: AC
  Filled 2021-10-13: qty 5

## 2021-10-13 MED ORDER — PROPOFOL 500 MG/50ML IV EMUL
INTRAVENOUS | Status: DC | PRN
  Administered 2021-10-13: 175 ug/kg/min via INTRAVENOUS

## 2021-10-13 MED ORDER — CEFAZOLIN SODIUM-DEXTROSE 2-4 GM/100ML-% IV SOLN
2.0000 g | INTRAVENOUS | Status: AC
  Administered 2021-10-13: 2 g via INTRAVENOUS

## 2021-10-13 MED ORDER — ONDANSETRON HCL 4 MG/2ML IJ SOLN: 4.0000 mg | Freq: Four times a day (QID) | INTRAMUSCULAR | Status: DC | PRN

## 2021-10-13 MED ORDER — ONDANSETRON HCL 4 MG/2ML IJ SOLN
INTRAMUSCULAR | Status: AC
  Filled 2021-10-13: qty 2

## 2021-10-13 MED ORDER — ONDANSETRON 4 MG PO TBDP: 4.0000 mg | ORAL_TABLET | Freq: Four times a day (QID) | ORAL | Status: DC | PRN

## 2021-10-13 MED ORDER — KETAMINE HCL 10 MG/ML IJ SOLN
INTRAMUSCULAR | Status: DC | PRN
  Administered 2021-10-13 (×2): 20 mg via INTRAVENOUS

## 2021-10-13 MED ORDER — BUPIVACAINE HCL (PF) 0.5 % IJ SOLN
INTRAMUSCULAR | Status: AC
  Filled 2021-10-13: qty 30

## 2021-10-13 MED ORDER — SUGAMMADEX SODIUM 200 MG/2ML IV SOLN
INTRAVENOUS | Status: DC | PRN
  Administered 2021-10-13: 150 mg via INTRAVENOUS

## 2021-10-13 MED ORDER — MAGTRACE LYMPHATIC TRACER
INTRAMUSCULAR | Status: DC | PRN
  Administered 2021-10-13: 2 mL via INTRAMUSCULAR

## 2021-10-13 MED ORDER — ENSURE PRE-SURGERY PO LIQD: 296.0000 mL | Freq: Once | ORAL | Status: DC

## 2021-10-13 MED ORDER — ACETAMINOPHEN 500 MG PO TABS: 1000.0000 mg | ORAL_TABLET | Freq: Once | ORAL | Status: DC

## 2021-10-13 MED ORDER — ACETAMINOPHEN 500 MG PO TABS
ORAL_TABLET | ORAL | Status: AC
  Filled 2021-10-13: qty 2

## 2021-10-13 MED ORDER — SODIUM CHLORIDE 0.9 % IV SOLN
INTRAVENOUS | Status: DC | PRN
  Administered 2021-10-13: 500 mL

## 2021-10-13 MED ORDER — FENTANYL CITRATE (PF) 100 MCG/2ML IJ SOLN
INTRAMUSCULAR | Status: AC
  Filled 2021-10-13: qty 2

## 2021-10-13 MED ORDER — ACETAMINOPHEN 500 MG PO TABS
1000.0000 mg | ORAL_TABLET | ORAL | Status: AC
  Administered 2021-10-13: 1000 mg via ORAL

## 2021-10-13 MED ORDER — ROCURONIUM BROMIDE 100 MG/10ML IV SOLN
INTRAVENOUS | Status: DC | PRN
  Administered 2021-10-13: 70 mg via INTRAVENOUS

## 2021-10-13 MED ORDER — FENTANYL CITRATE (PF) 100 MCG/2ML IJ SOLN
100.0000 ug | Freq: Once | INTRAMUSCULAR | Status: AC
  Administered 2021-10-13: 50 ug via INTRAVENOUS

## 2021-10-13 MED ORDER — ROPIVACAINE HCL 5 MG/ML IJ SOLN
INTRAMUSCULAR | Status: DC | PRN
  Administered 2021-10-13: 30 mL via PERINEURAL

## 2021-10-13 MED ORDER — DEXAMETHASONE SODIUM PHOSPHATE 10 MG/ML IJ SOLN
INTRAMUSCULAR | Status: DC | PRN
  Administered 2021-10-13: 5 mg

## 2021-10-13 MED ORDER — DEXAMETHASONE SODIUM PHOSPHATE 4 MG/ML IJ SOLN
INTRAMUSCULAR | Status: DC | PRN
  Administered 2021-10-13: 10 mg via INTRAVENOUS

## 2021-10-13 MED ORDER — CEFAZOLIN SODIUM-DEXTROSE 1-4 GM/50ML-% IV SOLN
1.0000 g | Freq: Three times a day (TID) | INTRAVENOUS | Status: DC
  Administered 2021-10-13: 1 g via INTRAVENOUS
  Filled 2021-10-13: qty 50

## 2021-10-13 MED ORDER — PROPOFOL 500 MG/50ML IV EMUL
INTRAVENOUS | Status: AC
  Filled 2021-10-13: qty 50

## 2021-10-13 MED ORDER — KCL IN DEXTROSE-NACL 20-5-0.45 MEQ/L-%-% IV SOLN
INTRAVENOUS | Status: DC
  Filled 2021-10-13: qty 1000

## 2021-10-13 MED ORDER — OXYCODONE HCL 5 MG PO TABS
5.0000 mg | ORAL_TABLET | ORAL | Status: DC | PRN
  Administered 2021-10-13: 5 mg via ORAL
  Filled 2021-10-13: qty 1

## 2021-10-13 MED ORDER — LACTATED RINGERS IV SOLN: INTRAVENOUS | Status: DC

## 2021-10-13 MED ORDER — PROPOFOL 10 MG/ML IV BOLUS
INTRAVENOUS | Status: AC
Start: 2021-10-13 — End: ?
  Filled 2021-10-13: qty 20

## 2021-10-13 MED ORDER — MIDAZOLAM HCL 2 MG/2ML IJ SOLN
INTRAMUSCULAR | Status: AC
  Filled 2021-10-13: qty 2

## 2021-10-13 MED ORDER — FENTANYL CITRATE (PF) 100 MCG/2ML IJ SOLN: 25.0000 ug | INTRAMUSCULAR | Status: DC | PRN

## 2021-10-13 MED ORDER — KETOROLAC TROMETHAMINE 30 MG/ML IJ SOLN
30.0000 mg | Freq: Three times a day (TID) | INTRAMUSCULAR | Status: DC
  Administered 2021-10-13: 30 mg via INTRAVENOUS
  Filled 2021-10-13: qty 1

## 2021-10-13 MED ORDER — METHOCARBAMOL 500 MG PO TABS
500.0000 mg | ORAL_TABLET | Freq: Four times a day (QID) | ORAL | Status: DC | PRN
  Administered 2021-10-13: 500 mg via ORAL
  Filled 2021-10-13: qty 1

## 2021-10-13 MED ORDER — HYDROMORPHONE HCL 1 MG/ML IJ SOLN: 0.5000 mg | INTRAMUSCULAR | Status: DC | PRN

## 2021-10-13 MED ORDER — MIDAZOLAM HCL 2 MG/2ML IJ SOLN
2.0000 mg | Freq: Once | INTRAMUSCULAR | Status: AC
  Administered 2021-10-13: 2 mg via INTRAVENOUS

## 2021-10-13 MED ORDER — FENTANYL CITRATE (PF) 100 MCG/2ML IJ SOLN
INTRAMUSCULAR | Status: DC | PRN
  Administered 2021-10-13: 100 ug via INTRAVENOUS

## 2021-10-13 MED ORDER — SODIUM CHLORIDE 0.9 % IV SOLN
INTRAVENOUS | Status: AC
  Filled 2021-10-13: qty 10

## 2021-10-13 MED ORDER — ONDANSETRON HCL 4 MG/2ML IJ SOLN
INTRAMUSCULAR | Status: DC | PRN
  Administered 2021-10-13: 4 mg via INTRAVENOUS

## 2021-10-13 SURGICAL SUPPLY — 91 items
ALLOGRAFT PERF 16X20 1.6+/-0.4 (Tissue) ×1 IMPLANT
APPLIER CLIP 9.375 MED OPEN (MISCELLANEOUS)
BAG DECANTER FOR FLEXI CONT (MISCELLANEOUS) ×4 IMPLANT
BINDER BREAST LRG (GAUZE/BANDAGES/DRESSINGS) ×1 IMPLANT
BINDER BREAST MEDIUM (GAUZE/BANDAGES/DRESSINGS) IMPLANT
BINDER BREAST XLRG (GAUZE/BANDAGES/DRESSINGS) IMPLANT
BINDER BREAST XXLRG (GAUZE/BANDAGES/DRESSINGS) IMPLANT
BLADE CLIPPER SURG (BLADE) IMPLANT
BLADE SURG 10 STRL SS (BLADE) ×4 IMPLANT
BLADE SURG 15 STRL LF DISP TIS (BLADE) ×3 IMPLANT
BLADE SURG 15 STRL SS (BLADE) ×1
BNDG GAUZE DERMACEA FLUFF (GAUZE/BANDAGES/DRESSINGS)
BNDG GAUZE DERMACEA FLUFF 4 (GAUZE/BANDAGES/DRESSINGS) IMPLANT
CANISTER SUCT 1200ML W/VALVE (MISCELLANEOUS) ×4 IMPLANT
CHLORAPREP W/TINT 26 (MISCELLANEOUS) ×5 IMPLANT
CLIP APPLIE 9.375 MED OPEN (MISCELLANEOUS) IMPLANT
COVER BACK TABLE 60X90IN (DRAPES) ×4 IMPLANT
COVER MAYO STAND STRL (DRAPES) ×5 IMPLANT
COVER PROBE W GEL 5X96 (DRAPES) ×5 IMPLANT
DERMABOND ADVANCED (GAUZE/BANDAGES/DRESSINGS) ×1
DERMABOND ADVANCED .7 DNX12 (GAUZE/BANDAGES/DRESSINGS) ×3 IMPLANT
DRAIN CHANNEL 15F RND FF W/TCR (WOUND CARE) ×1 IMPLANT
DRAIN CHANNEL 19F RND (DRAIN) ×4 IMPLANT
DRAPE INCISE IOBAN 66X45 STRL (DRAPES) ×1 IMPLANT
DRAPE LAPAROSCOPIC ABDOMINAL (DRAPES) ×4 IMPLANT
DRAPE TOP ARMCOVERS (MISCELLANEOUS) ×4 IMPLANT
DRAPE U-SHAPE 76X120 STRL (DRAPES) ×4 IMPLANT
DRAPE UTILITY XL STRL (DRAPES) ×4 IMPLANT
DRSG PAD ABDOMINAL 8X10 ST (GAUZE/BANDAGES/DRESSINGS) ×8 IMPLANT
DRSG TEGADERM 2-3/8X2-3/4 SM (GAUZE/BANDAGES/DRESSINGS) ×1 IMPLANT
DRSG TEGADERM 4X10 (GAUZE/BANDAGES/DRESSINGS) ×1 IMPLANT
DRSG TEGADERM 4X4.75 (GAUZE/BANDAGES/DRESSINGS) ×1 IMPLANT
ELECT BLADE 4.0 EZ CLEAN MEGAD (MISCELLANEOUS)
ELECT COATED BLADE 2.86 ST (ELECTRODE) ×4 IMPLANT
ELECT REM PT RETURN 9FT ADLT (ELECTROSURGICAL) ×4
ELECTRODE BLDE 4.0 EZ CLN MEGD (MISCELLANEOUS) ×3 IMPLANT
ELECTRODE REM PT RTRN 9FT ADLT (ELECTROSURGICAL) ×3 IMPLANT
EVACUATOR SILICONE 100CC (DRAIN) ×5 IMPLANT
EXPANDER TISSUE FORTE 300CC (Breast) IMPLANT
GAUZE SPONGE 4X4 12PLY STRL LF (GAUZE/BANDAGES/DRESSINGS) IMPLANT
GLOVE BIO SURGEON STRL SZ 6 (GLOVE) ×4 IMPLANT
GLOVE BIOGEL PI IND STRL 7.0 (GLOVE) IMPLANT
GLOVE BIOGEL PI IND STRL 7.5 (GLOVE) IMPLANT
GLOVE BIOGEL PI INDICATOR 7.0 (GLOVE) ×1
GLOVE BIOGEL PI INDICATOR 7.5 (GLOVE) ×1
GLOVE SURG SIGNA 7.5 PF LTX (GLOVE) ×4 IMPLANT
GOWN STRL REUS W/ TWL LRG LVL3 (GOWN DISPOSABLE) ×6 IMPLANT
GOWN STRL REUS W/ TWL XL LVL3 (GOWN DISPOSABLE) ×3 IMPLANT
GOWN STRL REUS W/TWL LRG LVL3 (GOWN DISPOSABLE) ×2
GOWN STRL REUS W/TWL XL LVL3 (GOWN DISPOSABLE) ×1
HEMOSTAT SURGICEL 2X14 (HEMOSTASIS) IMPLANT
LIGHT WAVEGUIDE WIDE FLAT (MISCELLANEOUS) ×4 IMPLANT
MARKER SKIN DUAL TIP RULER LAB (MISCELLANEOUS) IMPLANT
NDL HYPO 25X1 1.5 SAFETY (NEEDLE) ×6 IMPLANT
NDL SAFETY ECLIPSE 18X1.5 (NEEDLE) ×3 IMPLANT
NEEDLE HYPO 18GX1.5 SHARP (NEEDLE) ×1
NEEDLE HYPO 25X1 1.5 SAFETY (NEEDLE) ×8 IMPLANT
NS IRRIG 1000ML POUR BTL (IV SOLUTION) ×4 IMPLANT
PACK BASIN DAY SURGERY FS (CUSTOM PROCEDURE TRAY) ×4 IMPLANT
PENCIL SMOKE EVACUATOR (MISCELLANEOUS) ×4 IMPLANT
PIN SAFETY STERILE (MISCELLANEOUS) ×4 IMPLANT
PUNCH BIOPSY DERMAL 4MM (MISCELLANEOUS) IMPLANT
SHEET MEDIUM DRAPE 40X70 STRL (DRAPES) ×5 IMPLANT
SLEEVE SCD COMPRESS KNEE MED (STOCKING) ×4 IMPLANT
SPIKE FLUID TRANSFER (MISCELLANEOUS) IMPLANT
SPONGE T-LAP 18X18 ~~LOC~~+RFID (SPONGE) ×8 IMPLANT
STAPLER VISISTAT 35W (STAPLE) ×4 IMPLANT
SUT CHROMIC 4 0 PS 2 18 (SUTURE) ×2 IMPLANT
SUT ETHILON 2 0 FS 18 (SUTURE) ×5 IMPLANT
SUT ETHILON 3 0 PS 1 (SUTURE) ×3 IMPLANT
SUT MNCRL AB 4-0 PS2 18 (SUTURE) ×5 IMPLANT
SUT PDS AB 2-0 CT2 27 (SUTURE) IMPLANT
SUT SILK 2 0 SH (SUTURE) IMPLANT
SUT VIC AB 3-0 SH 27 (SUTURE) ×3
SUT VIC AB 3-0 SH 27X BRD (SUTURE) ×3 IMPLANT
SUT VICRYL 0 CT-2 (SUTURE) ×2 IMPLANT
SUT VICRYL 4-0 PS2 18IN ABS (SUTURE) IMPLANT
SUT VLOC 180 0 24IN GS25 (SUTURE) ×1 IMPLANT
SYR 10ML LL (SYRINGE) ×4 IMPLANT
SYR 50ML LL SCALE MARK (SYRINGE) ×4 IMPLANT
SYR BULB EAR ULCER 3OZ GRN STR (SYRINGE) ×3 IMPLANT
SYR BULB IRRIG 60ML STRL (SYRINGE) ×4 IMPLANT
SYR CONTROL 10ML LL (SYRINGE) ×6 IMPLANT
TAPE MEASURE VINYL STERILE (MISCELLANEOUS) IMPLANT
TISSUE EXPANDER FORTE 300CC (Breast) ×4 IMPLANT
TOWEL GREEN STERILE FF (TOWEL DISPOSABLE) ×5 IMPLANT
TRACER MAGTRACE VIAL (MISCELLANEOUS) ×1 IMPLANT
TRAY DSU PREP LF (CUSTOM PROCEDURE TRAY) ×1 IMPLANT
TUBE CONNECTING 20X1/4 (TUBING) ×4 IMPLANT
UNDERPAD 30X36 HEAVY ABSORB (UNDERPADS AND DIAPERS) ×8 IMPLANT
YANKAUER SUCT BULB TIP NO VENT (SUCTIONS) ×4 IMPLANT

## 2021-10-13 NOTE — Interval H&P Note (Signed)
History and Physical Interval Note:no change in H and P  10/13/2021 6:48 AM  [place]  has presented today for surgery, with the diagnosis of RIGHT BREAST CANCER.  The various methods of treatment have been discussed with the patient and family. After consideration of risks, benefits and other options for treatment, the patient has consented to  Procedure(s) with comments: RIGHT MASTECTOMY (Right) - LMA TARGETED LYMPH NODE DISSECTION (Right) RIGHT BREAST RECONSTRUCTION WITH PLACEMENT OF TISSUE EXPANDER AND ALLODERM (Right) as a surgical intervention.  The patient's history has been reviewed, patient examined, no change in status, stable for surgery.  I have reviewed the patient's chart and labs.  Questions were answered to the patient's satisfaction.     Coralie Keens

## 2021-10-13 NOTE — Op Note (Signed)
RIGHT MASTECTOMY, TARGETED LYMPH NODE DISSECTION  Procedure Note  Tara White 10/13/2021   Pre-op Diagnosis: RIGHT BREAST CANCER     Post-op Diagnosis: same  Procedure(s): RIGHT MASTECTOMY TARGETED RIGHT AXILLARY LYMPH NODE DISSECTION INJECTION OF [place] FOR LYMPH NODE MAPPING  Surgeon: Coralie Keens, MD  Anesthesia: General  Staff:  Circulator: McDonough-Hughes, Delene Ruffini, RN; Merwyn Katos, RN Scrub Person: Patric Dykes, RN  Estimated Blood Loss: Minimal               Specimens: sent to path  Indications: This is a 31 year old female who is now finished neoadjuvant chemotherapy for a right breast cancer.  She had a previous positive lymph node.  The decision was made to proceed with a right total mastectomy and targeted lymph node dissection as well as immediate reconstruction by plastic surgery  Procedure: The patient is brought to operating room identifies a correct patient.  She was placed upon on the operating table general anesthesia was induced.  Her chest, breast, and axilla were then prepped and draped in usual sterile fashion after I injected mag trace underneath the right nipple areolar complex.  The breast was then massaged.  Dr. Iran Planas marked the area for the mastectomy on the right breast.  This included the nipple areolar complex.  I then made a triangular shaped incision around the nipple areolar complex where she had marked with a scalpel.  I then dissected down into the breast tissue with electrocautery dissecting out the medial flap first staying just under the skin and dermis and then going down to the chest wall medially.  I then dissected the inferior flap going down to the inframammary ridge with the cautery as well.  I then dissected the superior and lateral skin flap with the electrocautery going to the level of the clavicle and toward the axilla and lateral breast.  I took the dissection circumferentially down to the pectoralis muscle.  I then  slowly dissected the breast tissue off the pectoralis muscle medial to lateral with the electrocautery.  Once the breast was completely removed I then marked the 12 o'clock position with a suture in the breast was sent to pathology for evaluation.  Using the neoprobe I was then able to identify the targeted lymph node in the right axilla.  I was able to dissect into the deep axilla and identified the lymph node and remove it with the surrounding breast tissue with electrocautery.  An x-ray was performed confirming that the radioactive seed was in the specimen.  I then used the mag trace probe and identified several more lymph nodes that had uptake of the tracer and I excised these en bloc with electrocautery as well.  Several bridging vessels were then clipped with surgical clips for hemostasis.  All the lymph nodes were sent to pathology for evaluation.  At this point I evaluated the skin flaps and muscle hemostasis appeared to be achieved.  Dr. Iran Planas was present and then began her portion of the immediate reconstruction.  All counts were correct and the patient was in a stable condition.          Coralie Keens   Date: 10/13/2021  Time: 8:31 AM

## 2021-10-13 NOTE — Transfer of Care (Signed)
Immediate Anesthesia Transfer of Care Note  Patient: Tara White  Procedure(s) Performed: RIGHT MASTECTOMY (Right: Breast) TARGETED LYMPH NODE DISSECTION (Right: Axilla) RIGHT BREAST RECONSTRUCTION WITH PLACEMENT OF TISSUE EXPANDER AND ALLODERM (Right: Chest)  Patient Location: PACU  Anesthesia Type:General  Level of Consciousness: drowsy, patient cooperative and responds to stimulation  Airway & Oxygen Therapy: Patient Spontanous Breathing and Patient connected to face mask oxygen  Post-op Assessment: Report given to RN and Post -op Vital signs reviewed and stable  Post vital signs: Reviewed and stable  Last Vitals:  Vitals Value Taken Time  BP    Temp    Pulse 64 10/13/21 0955  Resp    SpO2 100 % 10/13/21 0955  Vitals shown include unvalidated device data.  Last Pain:  Vitals:   10/13/21 0642  TempSrc: Oral  PainSc: 0-No pain      Patients Stated Pain Goal: 4 (85/92/92 4462)  Complications: No notable events documented.

## 2021-10-13 NOTE — Discharge Summary (Signed)
Physician Discharge Summary  Patient ID: Tara White MRN: [id number] DOB/AGE: 08/28/1990 31 y.o.  Admit date: 10/13/2021 Discharge date: 10/13/2021  Admission Diagnoses: right breast cancer  Discharge Diagnoses:  Principal Problem:   Breast cancer, right [facility])   Discharged Condition: stable  Hospital Course: Post operatively patient did well with minimal pain and tolerating diet, ambulatory without assist. Patient and husband comfortable with discharge evening of surgery. Instructed on bathing and drain care.  Treatments: surgery: right mastectomy sentinel node tissue expander reconstruction acellular dermis 7.18.23  Discharge Exam: Blood pressure 112/63, pulse 70, temperature 98.1 F (36.7 C), resp. rate 18, height '5\' 9"'$  (1.753 m), weight 71.5 kg, SpO2 100 %, unknown if currently breastfeeding. Incision/Wound: incisions dry intact Tegaderms in place drains serosanguinous  Disposition: Discharge disposition: 01-Home or Self Care       Discharge Instructions     Call MD for:  redness, tenderness, or signs of infection (pain, swelling, bleeding, redness, odor or green/yellow discharge around incision site)   Complete by: As directed    Call MD for:  temperature >100.5   Complete by: As directed    Discharge instructions   Complete by: As directed    Ok to remove dressings and shower am 7.20.23 Soap and water ok, pat Tegaderms dry. Do not remove Tegaderms. No creams or ointments over incisions. Do not let drains dangle in shower, attach to lanyard or similar.Strip and record drains twice daily and bring log to clinic visit.  Breast binder or soft compression bra all other times.  Ok to raise arms above shoulders for bathing and dressing.  No house yard work or exercise until cleared by MD.   Patient received all Rx preop. Recommend Tylenol as directed to aid with pain. Resume Xarelto pm 7.21.23. Recommend Miralax or Dulcolax as needed for constipation.   Driving  Restrictions   Complete by: As directed    No driving for 2 weeks then no driving if taking prescription pain medication   Lifting restrictions   Complete by: As directed    No lifting > 5-10 lbs until cleared by MD   Resume previous diet   Complete by: As directed       Allergies as of 10/13/2021   No Known Allergies      Medication List     TAKE these medications    clindamycin 1 % gel Commonly known as: CLINDAGEL clindamycin 1 % topical gel  APPLY TOPICALLY TWICE A DAY   Culturelle Caps Take 1 capsule by mouth daily.   Frankincense Uplifting Oil Apply 2 drops topically.   lidocaine-prilocaine cream Commonly known as: EMLA Apply to affected area once   LORazepam 0.5 MG tablet Commonly known as: Ativan Take 1 tablet (0.5 mg total) by mouth at bedtime as needed (Nausea or vomiting).   ondansetron 8 MG tablet Commonly known as: Zofran Take 1 tablet (8 mg total) by mouth 2 (two) times daily as needed (Nausea or vomiting). Start on the third day after chemotherapy.   PROBIOTIC PRODUCT PO Take by mouth.   prochlorperazine 10 MG tablet Commonly known as: COMPAZINE Take 1 tablet (10 mg total) by mouth every 6 (six) hours as needed (Nausea or vomiting).   rivaroxaban 20 MG Tabs tablet Commonly known as: XARELTO Take 1 tablet (20 mg total) by mouth daily with supper.        Follow-up Information     Irene Limbo, MD Follow up in 1 week(s).   Specialty: Plastic Surgery Why: as  scheduled Contact information: [address] [place] [address] [phone]         Coralie Keens, MD. Schedule an appointment as soon as possible for a visit in 2 week(s).   Specialty: General Surgery Contact information: [address] [place] [address] [phone]                 Signed: Irene Limbo 10/13/2021, 3:04 PM

## 2021-10-13 NOTE — Interval H&P Note (Signed)
History and Physical Interval Note:  10/13/2021 6:57 AM  [place]  has presented today for surgery, with the diagnosis of RIGHT BREAST CANCER.  The various methods of treatment have been discussed with the patient and family. After consideration of risks, benefits and other options for treatment, the patient has consented to  Procedure(s) with comments: RIGHT MASTECTOMY (Right) - LMA TARGETED LYMPH NODE DISSECTION (Right) RIGHT BREAST RECONSTRUCTION WITH PLACEMENT OF TISSUE EXPANDER AND ALLODERM (Right) as a surgical intervention.  The patient's history has been reviewed, patient examined, no change in status, stable for surgery.  I have reviewed the patient's chart and labs.  Questions were answered to the patient's satisfaction.     Arnoldo Hooker Daniela Siebers

## 2021-10-13 NOTE — Anesthesia Procedure Notes (Signed)
Procedure Name: Intubation Date/Time: 10/13/2021 7:38 AM  Performed by: Glory Buff, CRNAPre-anesthesia Checklist: Patient identified, Emergency Drugs available, Suction available and Patient being monitored Patient Re-evaluated:Patient Re-evaluated prior to induction Oxygen Delivery Method: Circle system utilized Preoxygenation: Pre-oxygenation with 100% oxygen Induction Type: IV induction Ventilation: Mask ventilation without difficulty Laryngoscope Size: Glidescope and 3 Grade View: Grade I Tube type: Oral Number of attempts: 1 Airway Equipment and Method: Stylet and Oral airway Placement Confirmation: ETT inserted through vocal cords under direct vision, positive ETCO2 and breath sounds checked- equal and bilateral Secured at: 20 cm Tube secured with: Tape Dental Injury: Teeth and Oropharynx as per pre-operative assessment

## 2021-10-13 NOTE — Discharge Instructions (Addendum)
No ibuprofen until 9:00 p.m Robaxin given at [place] Instructions  Activity: Get plenty of rest for the remainder of the day. A responsible individual must stay with you for 24 hours following the procedure.  For the next 24 hours, DO NOT: -Drive a car -Paediatric nurse -Drink alcoholic beverages -Take any medication unless instructed by your physician -Make any legal decisions or sign important papers.  Meals: Start with liquid foods such as gelatin or soup. Progress to regular foods as tolerated. Avoid greasy, spicy, heavy foods. If nausea and/or vomiting occur, drink only clear liquids until the nausea and/or vomiting subsides. Call your physician if vomiting continues.  Special Instructions/Symptoms: Your throat may feel dry or sore from the anesthesia or the breathing tube placed in your throat during surgery. If this causes discomfort, gargle with warm salt water. The discomfort should disappear within 24 hours.  If you had a scopolamine patch placed behind your ear for the management of post- operative nausea and/or vomiting:  1. The medication in the patch is effective for 72 hours, after which it should be removed.  Wrap patch in a tissue and discard in the trash. Wash hands thoroughly with soap and water. 2. You may remove the patch earlier than 72 hours if you experience unpleasant side effects which may include dry mouth, dizziness or visual disturbances. 3. Avoid touching the patch. Wash your hands with soap and water after contact with the patch.     Post Anesthesia Home Care Instructions  Activity: Get plenty of rest for the remainder of the day. A responsible individual must stay with you for 24 hours following the procedure.  For the next 24 hours, DO NOT: -Drive a car -Paediatric nurse -Drink alcoholic beverages -Take any medication unless instructed by your physician -Make any legal decisions or sign important papers.  Meals: Start  with liquid foods such as gelatin or soup. Progress to regular foods as tolerated. Avoid greasy, spicy, heavy foods. If nausea and/or vomiting occur, drink only clear liquids until the nausea and/or vomiting subsides. Call your physician if vomiting continues.  Special Instructions/Symptoms: Your throat may feel dry or sore from the anesthesia or the breathing tube placed in your throat during surgery. If this causes discomfort, gargle with warm salt water. The discomfort should disappear within 24 hours.  If you had a scopolamine patch placed behind your ear for the management of post- operative nausea and/or vomiting:  1. The medication in the patch is effective for 72 hours, after which it should be removed.  Wrap patch in a tissue and discard in the trash. Wash hands thoroughly with soap and water. 2. You may remove the patch earlier than 72 hours if you experience unpleasant side effects which may include dry mouth, dizziness or visual disturbances. 3. Avoid touching the patch. Wash your hands with soap and water after contact with the patch.        JP Drain [employer] this sheet to all of your post-operative appointments while you have your drains. Please measure your drains by CC's or ML's. Make sure you drain and measure your JP Drains 2 or 3 times per day. At the end of each day, add up totals for the left side and add up totals for the right side.    ( 9 am )     ( 3 pm )        ( 9 pm )  Date L  R  L  R  L  R  Total L/R

## 2021-10-13 NOTE — Progress Notes (Signed)
Assisted Dr. Woodrum with right, pectoralis, ultrasound guided block. Side rails up, monitors on throughout procedure. See vital signs in flow sheet. Tolerated Procedure well. 

## 2021-10-13 NOTE — Op Note (Signed)
Operative Note   DATE OF OPERATION: 7.18.23  LOCATION: [facility]-observation  SURGICAL DIVISION: Plastic Surgery  PREOPERATIVE DIAGNOSES:  1. Right breast ca UOQ ER-, metastatic to LN  POSTOPERATIVE DIAGNOSES:  same  PROCEDURE:  1. Right breast reconstruction with tissue expander 2. Acellular dermis (Alloderm) to right chest 275 cm2  SURGEON: Irene Limbo MD MBA  ASSISTANT: none  ANESTHESIA:  General.   EBL: 20 ml for entire procedure  COMPLICATIONS: None immediate.   INDICATIONS FOR PROCEDURE:  The patient, Tara White, is a 31 y.o. female born on June 27, 1990, is here for immediate prepectoral tissue expander based reconstruction following skin reduction pattern mastectomy.   FINDINGS: Natrelle 133S FV-11 T 300 ml tissue expander placed initial fill volume 125 ml air. SN [id number]  DESCRIPTION OF PROCEDURE:  The patient was marked to mark sternal notch, chest midline, anterior axillary lines and inframammary folds. Patient was marked for skin reduction mastectomy with most superior portion nipple areola marked on breast meridian. Vertical limbs marked by breast displacement and set at 8 cm length. The patient was taken to the operating room. SCDs were placed and IV antibiotics were given. The patient's operative site was prepped and draped in a sterile fashion. A time out was performed and all information was confirmed to be correct. Following completion of mastectomy, the lateral limbs for resection marked and area over lower pole preserved as inferiorly based dermal pedicle. Skin de epithelialized in this area.    The cavity was irrigated with saline solution containing Ancef, gentamicin, and Betadine. Hemostasis was ensured. A 19 Fr drain was placed in subcutaneous position laterally and a 15 Fr drain placed along inframammary fold. Each secured to skin with 2-0 nylon. The tissue expander was prepared on back table prior in insertion. The expander was filled with air.  Perforated acellular dermis was  draped over anterior surface expander. The ADM was then secured to itself over posterior surface of expander with 4-0 chromic. Redundant folds acellular dermis excised so that the ADM lay flat without folds over air filled expander. The expander was secured to medial insertion pectoralis with a 0 vicryl. The superior and lateral tabs also secured to pectoralis muscle with 0-vicryl. The ADM was secured to pectoralis muscle and chest wall along inferior border at inframammary fold with 0 V lock. Laterally the mastectomy flap over posterior axillary line was advanced anteriorly and the subcutaneous tissue and superficial fascia was secured to chest wall with 0-vicryl. The inferiorly based dermal pedicle was redraped superiorly over expander and acellular dermis and secured to pectoralis with interrupted 0-vicryl. Skin closure completed with 3-0 vicryl in fascial layer and 4-0 vicryl in dermis. Skin closure completed with 4-0 monocryl subcuticular and tissue adhesive. Tegaderms applied bilateral, followed by dry dressing and breast binder.   The patient was allowed to wake from anesthesia, extubated and taken to the recovery room in satisfactory condition.   SPECIMENS: none  DRAINS: 15 and 19 Fr JP in right reconstruction  Irene Limbo, MD [facility]astic [facility] Surgery  Office/ physician access line after hours [phone]

## 2021-10-13 NOTE — Anesthesia Procedure Notes (Signed)
Anesthesia Regional Block: Pectoralis block   Pre-Anesthetic Checklist: , timeout performed,  Correct Patient, Correct Site, Correct Laterality,  Correct Procedure, Correct Position, site marked,  Risks and benefits discussed,  Pre-op evaluation,  At surgeon's request and post-op pain management  Laterality: Right  Prep: Maximum Sterile Barrier Precautions used, chloraprep       Needles:  Injection technique: Single-shot  Needle Type: Echogenic Stimulator Needle     Needle Length: 9cm  Needle Gauge: 21     Additional Needles:   Procedures:,,,, ultrasound used (permanent image in chart),,    Narrative:  Start time: 10/13/2021 7:10 AM End time: 10/13/2021 7:14 AM Injection made incrementally with aspirations every 5 mL. Anesthesiologist: Freddrick March, MD

## 2021-10-13 NOTE — Anesthesia Postprocedure Evaluation (Signed)
Anesthesia Post Note  Patient: Tara White  Procedure(s) Performed: RIGHT MASTECTOMY (Right: Breast) TARGETED LYMPH NODE DISSECTION (Right: Axilla) RIGHT BREAST RECONSTRUCTION WITH PLACEMENT OF TISSUE EXPANDER AND ALLODERM (Right: Chest)     Patient location during evaluation: PACU Anesthesia Type: Regional and General Level of consciousness: awake and alert Pain management: pain level controlled Vital Signs Assessment: post-procedure vital signs reviewed and stable Respiratory status: spontaneous breathing, nonlabored ventilation, respiratory function stable and patient connected to nasal cannula oxygen Cardiovascular status: blood pressure returned to baseline and stable Postop Assessment: no apparent nausea or vomiting Anesthetic complications: no   No notable events documented.  Last Vitals:  Vitals:   10/13/21 1025 10/13/21 1045  BP:    Pulse: (!) 52   Resp: 15 18  Temp:  36.6 C  SpO2: 100% 100%    Last Pain:  Vitals:   10/13/21 1045  TempSrc:   PainSc: 0-No pain                 Shiri Hodapp L Krish Bailly

## 2021-10-14 ENCOUNTER — Encounter (HOSPITAL_BASED_OUTPATIENT_CLINIC_OR_DEPARTMENT_OTHER): Payer: Self-pay | Admitting: Surgery

## 2021-10-15 LAB — SURGICAL PATHOLOGY

## 2021-10-16 ENCOUNTER — Encounter: Payer: Self-pay | Admitting: Hematology and Oncology

## 2021-10-16 ENCOUNTER — Other Ambulatory Visit: Payer: Self-pay | Admitting: *Deleted

## 2021-10-16 DIAGNOSIS — Z171 Estrogen receptor negative status [ER-]: Secondary | ICD-10-CM

## 2021-10-16 NOTE — Progress Notes (Signed)
Per MD pt needing to continue Xarelto 6 months after original diagnosis of subclavian DVT and f/u with repeat imaging to reassess. Orders placed for VAS [place] to be repeated in September 2023. Pt educated and verbalized understanding.

## 2021-10-19 ENCOUNTER — Other Ambulatory Visit: Payer: Self-pay

## 2021-10-20 ENCOUNTER — Encounter: Payer: Self-pay | Admitting: *Deleted

## 2021-10-20 ENCOUNTER — Other Ambulatory Visit: Payer: Self-pay

## 2021-10-20 NOTE — Progress Notes (Signed)
Patient Care Team: [facility], [place] as PCP - Philomena Doheny, Paulette Blanch, RN as Oncology Nurse Navigator Rockwell Germany, RN as Oncology Nurse Navigator Nicholas Lose, MD as Consulting Physician (Hematology and Oncology) Coralie Keens, MD as Consulting Physician (General Surgery) Eppie Gibson, MD as Attending Physician (Radiation Oncology)  DIAGNOSIS: No diagnosis found.  SUMMARY OF ONCOLOGIC HISTORY: Oncology History  Malignant neoplasm of upper-outer quadrant of right breast in female, estrogen receptor negative ([place])  04/08/2021 Initial Diagnosis   History of right breast eczematous change to the nipple, patient had palpable lump in the right breast her mammogram 2.1 cm spiculated mass with extensive calcifications that span 11 cm.  Ultrasound revealed 3 cm mass at 10:00 plus small satellite lesions.  Biopsy revealed grade 3 IDC ER 0%, PR 0%, HER2 3+ positive, Ki-67 30%   04/08/2021 Cancer Staging   Staging form: Breast, AJCC 8th Edition - Clinical stage from 04/08/2021: Stage IIB (cT2, cN1, cM0, G3, ER-, PR-, HER2+) - Signed by Nicholas Lose, MD on 04/15/2021 Stage prefix: Initial diagnosis Method of lymph node assessment: Axillary lymph node dissection Histologic grading system: 3 grade system   05/01/2021 -  Chemotherapy   Patient is on Treatment Plan : BREAST  Docetaxel + Carboplatin + Trastuzumab + Pertuzumab  (TCHP) q21d      05/21/2021 Genetic Testing   Negative hereditary cancer genetic testing: no pathogenic variants detected in [facility] CustomNext-Cancer +RNAinsight Panel.  Report date is 05/21/2021.   The CustomNext-Cancer+RNAinsight panel offered by [facility] includes sequencing and rearrangement analysis for the following 47 genes:  APC, ATM, AXIN2, BARD1, BMPR1A, BRCA1, BRCA2, BRIP1, CDH1, CDK4, CDKN2A, CHEK2, DICER1, EPCAM, GREM1, HOXB13, MEN1, MLH1, MSH2, MSH3, MSH6, MUTYH, NBN, NF1, NF2, NTHL1, PALB2, PMS2, POLD1, POLE, PTEN, RAD51C, RAD51D, RECQL, RET, SDHA,  SDHAF2, SDHB, SDHC, SDHD, SMAD4, SMARCA4, STK11, TP53, TSC1, TSC2, and VHL.  RNA data is routinely analyzed for use in variant interpretation for all genes.     CHIEF COMPLIANT: Follow-up Herceptin prejeta  INTERVAL HISTORY: Tara White is a 31 y.o with the above mention. She presents to the clinic today for follow-up.    ALLERGIES:  has No Known Allergies.  MEDICATIONS:  Current Outpatient Medications  Medication Sig Dispense Refill   clindamycin (CLINDAGEL) 1 % gel clindamycin 1 % topical gel  APPLY TOPICALLY TWICE A DAY     Homeopathic Products (FRANKINCENSE UPLIFTING) OIL Apply 2 drops topically.     Lactobacillus Rhamnosus, GG, (CULTURELLE) CAPS Take 1 capsule by mouth daily.     lidocaine-prilocaine (EMLA) cream Apply to affected area once 30 g 3   LORazepam (ATIVAN) 0.5 MG tablet Take 1 tablet (0.5 mg total) by mouth at bedtime as needed (Nausea or vomiting). 30 tablet 0   ondansetron (ZOFRAN) 8 MG tablet Take 1 tablet (8 mg total) by mouth 2 (two) times daily as needed (Nausea or vomiting). Start on the third day after chemotherapy. 30 tablet 1   PROBIOTIC PRODUCT PO Take by mouth.     prochlorperazine (COMPAZINE) 10 MG tablet Take 1 tablet (10 mg total) by mouth every 6 (six) hours as needed (Nausea or vomiting). 30 tablet 1   rivaroxaban (XARELTO) 20 MG TABS tablet Take 1 tablet (20 mg total) by mouth daily with supper. 30 tablet 6   No current facility-administered medications for this visit.    PHYSICAL EXAMINATION: ECOG PERFORMANCE STATUS: {CHL ONC ECOG PS:[phone]}  There were no vitals filed for this visit. There were no vitals filed for  this visit.  BREAST:*** No palpable masses or nodules in either right or left breasts. No palpable axillary supraclavicular or infraclavicular adenopathy no breast tenderness or nipple discharge. (exam performed in the presence of a chaperone)  LABORATORY DATA:  I have reviewed the data as listed    Latest Ref Rng &  Units 10/02/2021   11:46 AM 09/04/2021   10:55 AM 08/12/2021    8:51 AM  CMP  Glucose 70 - 99 mg/dL 82  106  125   BUN 6 - 20 mg/dL _0 Creatinine 0.44 - 1.00 mg/dL 0.90  0.88  0.80   Sodium 135 - 145 mmol/L 139  141  140   Potassium 3.5 - 5.1 mmol/L 3.9  3.8  3.9   Chloride 98 - 111 mmol/L 107  109  109   CO2 22 - 32 mmol/L _1 Calcium 8.9 - 10.3 mg/dL 9.4  9.4  8.8   Total Protein 6.5 - 8.1 g/dL 7.2  6.5  6.2   Total Bilirubin 0.3 - 1.2 mg/dL 0.5  0.3  0.3   Alkaline Phos 38 - 126 U/L 70  62  82   AST 15 - 41 U/L _2 ALT 0 - 44 U/L _3 Lab Results  Component Value Date   WBC 6.6 10/02/2021   HGB 11.6 (L) 10/02/2021   HCT 33.1 (L) 10/02/2021   MCV 95.1 10/02/2021   PLT 174 10/02/2021   NEUTROABS 3.8 10/02/2021    ASSESSMENT & PLAN:  No problem-specific Assessment & Plan notes found for this encounter.    No orders of the defined types were placed in this encounter.  The patient has a good understanding of the overall plan. she agrees with it. she will call with any problems that may develop before the next visit here. Total time spent: 30 mins including face to face time and time spent for planning, charting and co-ordination of care   Suzzette Righter, [place] 10/20/21    I Gardiner Coins am scribing for Dr. Lindi Adie  ***

## 2021-10-21 ENCOUNTER — Other Ambulatory Visit: Payer: Self-pay

## 2021-10-21 ENCOUNTER — Inpatient Hospital Stay (HOSPITAL_BASED_OUTPATIENT_CLINIC_OR_DEPARTMENT_OTHER): Payer: Commercial Managed Care - PPO | Admitting: Hematology and Oncology

## 2021-10-21 DIAGNOSIS — Z171 Estrogen receptor negative status [ER-]: Secondary | ICD-10-CM

## 2021-10-21 DIAGNOSIS — C50411 Malignant neoplasm of upper-outer quadrant of right female breast: Secondary | ICD-10-CM

## 2021-10-21 NOTE — Assessment & Plan Note (Signed)
04/08/2021:History of right breast eczematous change to the nipple, patient had palpable lump in the right breast her mammogram 2.1 cm spiculated mass with extensive calcifications that span 11 cm. Ultrasound revealed 3 cm mass at 10:00 plus small satellite lesions. Biopsy revealed grade 3 IDC ER 0%, PR 0%, HER2 3+ positive, Ki-67 30%  Recommendation: 1. Neoadjuvant chemotherapy with [place] Perjeta 6 cyclescompleted 5/17/2023followed by Herceptin Perjeta maintenance for 1 year 2. right mastectomy with reconstruction 10/13/2021: No residual malignancy complete pathologic response 0/5 lymph nodes negative 3. Followed by adjuvant radiation therapy ----------------------------------------------------------------------------------------------------------------------------------------------- Treatment plan: Based on complete pathologic response, I recommend continuation of Herceptin Perjeta maintenance.  Return to clinic every 3 weeks for Herceptin Perjeta and every 6 weeks for follow-up with me.

## 2021-10-22 ENCOUNTER — Other Ambulatory Visit: Payer: Self-pay

## 2021-10-22 ENCOUNTER — Encounter: Payer: Self-pay | Admitting: *Deleted

## 2021-10-23 ENCOUNTER — Encounter: Payer: Self-pay | Admitting: Hematology and Oncology

## 2021-10-23 ENCOUNTER — Encounter: Payer: Self-pay | Admitting: *Deleted

## 2021-10-27 ENCOUNTER — Other Ambulatory Visit: Payer: Self-pay

## 2021-10-28 ENCOUNTER — Encounter (HOSPITAL_COMMUNITY): Payer: Self-pay

## 2021-10-29 ENCOUNTER — Other Ambulatory Visit: Payer: Self-pay

## 2021-10-29 ENCOUNTER — Inpatient Hospital Stay: Payer: Commercial Managed Care - PPO

## 2021-10-29 ENCOUNTER — Inpatient Hospital Stay: Payer: Commercial Managed Care - PPO | Attending: Hematology and Oncology

## 2021-10-29 VITALS — BP 108/60 | HR 64 | Temp 97.6°F | Resp 16 | Wt 156.5 lb

## 2021-10-29 DIAGNOSIS — Z5111 Encounter for antineoplastic chemotherapy: Secondary | ICD-10-CM | POA: Diagnosis present

## 2021-10-29 DIAGNOSIS — Z9011 Acquired absence of right breast and nipple: Secondary | ICD-10-CM | POA: Insufficient documentation

## 2021-10-29 DIAGNOSIS — Z79899 Other long term (current) drug therapy: Secondary | ICD-10-CM | POA: Diagnosis not present

## 2021-10-29 DIAGNOSIS — C50411 Malignant neoplasm of upper-outer quadrant of right female breast: Secondary | ICD-10-CM

## 2021-10-29 DIAGNOSIS — Z95828 Presence of other vascular implants and grafts: Secondary | ICD-10-CM

## 2021-10-29 DIAGNOSIS — Z171 Estrogen receptor negative status [ER-]: Secondary | ICD-10-CM | POA: Diagnosis not present

## 2021-10-29 LAB — CBC WITH DIFFERENTIAL (CANCER CENTER ONLY)
Abs Immature Granulocytes: 0.04 10*3/uL (ref 0.00–0.07)
Basophils Absolute: 0 10*3/uL (ref 0.0–0.1)
Basophils Relative: 0 %
Eosinophils Absolute: 0.1 10*3/uL (ref 0.0–0.5)
Eosinophils Relative: 2 %
HCT: 34.9 % — ABNORMAL LOW (ref 36.0–46.0)
Hemoglobin: 12.1 g/dL (ref 12.0–15.0)
Immature Granulocytes: 1 %
Lymphocytes Relative: 48 %
Lymphs Abs: 2.5 10*3/uL (ref 0.7–4.0)
MCH: 31.8 pg (ref 26.0–34.0)
MCHC: 34.7 g/dL (ref 30.0–36.0)
MCV: 91.6 fL (ref 80.0–100.0)
Monocytes Absolute: 0.3 10*3/uL (ref 0.1–1.0)
Monocytes Relative: 6 %
Neutro Abs: 2.3 10*3/uL (ref 1.7–7.7)
Neutrophils Relative %: 43 %
Platelet Count: 200 10*3/uL (ref 150–400)
RBC: 3.81 MIL/uL — ABNORMAL LOW (ref 3.87–5.11)
RDW: 12.4 % (ref 11.5–15.5)
WBC Count: 5.2 10*3/uL (ref 4.0–10.5)
nRBC: 0 % (ref 0.0–0.2)

## 2021-10-29 LAB — CMP (CANCER CENTER ONLY)
ALT: 23 U/L (ref 0–44)
AST: 20 U/L (ref 15–41)
Albumin: 4.3 g/dL (ref 3.5–5.0)
Alkaline Phosphatase: 65 U/L (ref 38–126)
Anion gap: 6 (ref 5–15)
BUN: 23 mg/dL — ABNORMAL HIGH (ref 6–20)
CO2: 27 mmol/L (ref 22–32)
Calcium: 9.1 mg/dL (ref 8.9–10.3)
Chloride: 106 mmol/L (ref 98–111)
Creatinine: 1.07 mg/dL — ABNORMAL HIGH (ref 0.44–1.00)
GFR, Estimated: >60 mL/min (ref >60)
Glucose, Bld: 93 mg/dL (ref 70–99)
Potassium: 4 mmol/L (ref 3.5–5.1)
Sodium: 139 mmol/L (ref 135–145)
Total Bilirubin: 0.4 mg/dL (ref 0.3–1.2)
Total Protein: 7.3 g/dL (ref 6.5–8.1)

## 2021-10-29 MED ORDER — SODIUM CHLORIDE 0.9 % IV SOLN: Freq: Once | INTRAVENOUS | Status: AC

## 2021-10-29 MED ORDER — SODIUM CHLORIDE 0.9 % IV SOLN
420.0000 mg | Freq: Once | INTRAVENOUS | Status: AC
  Administered 2021-10-29: 420 mg via INTRAVENOUS
  Filled 2021-10-29: qty 14

## 2021-10-29 MED ORDER — DIPHENHYDRAMINE HCL 25 MG PO CAPS
50.0000 mg | ORAL_CAPSULE | Freq: Once | ORAL | Status: AC
  Administered 2021-10-29: 50 mg via ORAL
  Filled 2021-10-29: qty 2

## 2021-10-29 MED ORDER — SODIUM CHLORIDE 0.9% FLUSH
10.0000 mL | INTRAVENOUS | Status: DC | PRN
  Administered 2021-10-29: 10 mL

## 2021-10-29 MED ORDER — HEPARIN SOD (PORK) LOCK FLUSH 100 UNIT/ML IV SOLN
500.0000 [IU] | Freq: Once | INTRAVENOUS | Status: AC | PRN
  Administered 2021-10-29: 500 [IU]

## 2021-10-29 MED ORDER — ACETAMINOPHEN 325 MG PO TABS
650.0000 mg | ORAL_TABLET | Freq: Once | ORAL | Status: AC
  Administered 2021-10-29: 650 mg via ORAL
  Filled 2021-10-29: qty 2

## 2021-10-29 MED ORDER — TRASTUZUMAB-ANNS CHEMO 150 MG IV SOLR
6.0000 mg/kg | Freq: Once | INTRAVENOUS | Status: AC
  Administered 2021-10-29: 420 mg via INTRAVENOUS
  Filled 2021-10-29: qty 20

## 2021-10-29 MED ORDER — SODIUM CHLORIDE 0.9% FLUSH
10.0000 mL | Freq: Once | INTRAVENOUS | Status: AC
  Administered 2021-10-29: 10 mL

## 2021-10-29 NOTE — Patient Instructions (Signed)
[facility] MEDICAL ONCOLOGY  Discharge Instructions: Thank you for choosing [facility] to provide your oncology and hematology care.   If you have a lab appointment with the Cancer Center, please go directly to the Cancer Center and check in at the registration area.   Wear comfortable clothing and clothing appropriate for easy access to any Portacath or PICC line.   We strive to give you quality time with your provider. You may need to reschedule your appointment if you arrive late (15 or more minutes).  Arriving late affects you and other patients whose appointments are after yours.  Also, if you miss three or more appointments without notifying the office, you may be dismissed from the clinic at the provider's discretion.      For prescription refill requests, have your pharmacy contact our office and allow 72 hours for refills to be completed.    Today you received the following chemotherapy and/or immunotherapy agents: Kanjinti/Perjeta      To help prevent nausea and vomiting after your treatment, we encourage you to take your nausea medication as directed.  BELOW ARE SYMPTOMS THAT SHOULD BE REPORTED IMMEDIATELY: *FEVER GREATER THAN 100.4 F (38 C) OR HIGHER *CHILLS OR SWEATING *NAUSEA AND VOMITING THAT IS NOT CONTROLLED WITH YOUR NAUSEA MEDICATION *UNUSUAL SHORTNESS OF BREATH *UNUSUAL BRUISING OR BLEEDING *URINARY PROBLEMS (pain or burning when urinating, or frequent urination) *BOWEL PROBLEMS (unusual diarrhea, constipation, pain near the anus) TENDERNESS IN MOUTH AND THROAT WITH OR WITHOUT PRESENCE OF ULCERS (sore throat, sores in mouth, or a toothache) UNUSUAL RASH, SWELLING OR PAIN  UNUSUAL VAGINAL DISCHARGE OR ITCHING   Items with * indicate a potential emergency and should be followed up as soon as possible or go to the Emergency Department if any problems should occur.  Please show the CHEMOTHERAPY ALERT CARD or IMMUNOTHERAPY ALERT CARD at  check-in to the Emergency Department and triage nurse.  Should you have questions after your visit or need to cancel or reschedule your appointment, please contact [facility]ICAL [facility]COL[facility]  Dept: [phone]  and follow the prompts.  Office hours are 8:00 a.m. to 4:30 p.m. Monday - Friday. Please note that voicemails left after 4:00 p.m. may not be returned until the following business day.  We are closed weekends and major holidays. You have access to a nurse at all times for urgent questions. Please call the main number to the clinic Dept: [phone] and follow the prompts.   For any non-urgent questions, you may also contact your provider using MyChart. We now offer e-Visits for anyone 18 and older to request care online for non-urgent symptoms. For details visit [email or web address].   Also download the MyChart app! Go to the app store, search "MyChart", open the app, select [facility], and log in with your MyChart username and password.  Masks are optional in the cancer centers. If you would like for your care team to wear a mask while they are taking care of you, please let them know. You may have one support person who is at least 32 years old accompany you for your appointments. 

## 2021-11-05 ENCOUNTER — Encounter: Payer: Self-pay | Admitting: *Deleted

## 2021-11-05 DIAGNOSIS — Z171 Estrogen receptor negative status [ER-]: Secondary | ICD-10-CM

## 2021-11-09 NOTE — Progress Notes (Signed)
Radiation Oncology         [phone] ________________________________  Name: Tara White MRN: [id number]  Date: 11/10/2021  DOB: 1990/05/30  Follow-Up Visit Note  Outpatient  CC: [facility]  Nicholas Lose, MD  Diagnosis:   No diagnosis found.   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     Since breast clinic consultation date of 04/15/21, she underwent the following imaging (dates and results as follows):  -- Bilateral breast MRI performed on 04/23/2021 showed: extensive non-mass enhancement throughout all quadrants of the breast, with the largest confluent area measuring 2.9 cm; right nipple enhancement consistent with DCIS; and a 1.9 cm right axillary lymph node. No abnormal findings were appreciated in the left breast. --CT of the chest abdomen and pelvis on 04/27/21 showed: a heterogeneous asymmetric contrast enhancement within the glandular tissue of the right breast, consistent with known primary breast malignancy; enlarged right axillary lymph nodes suspicious for nodal metastatic disease; and a nonspecific but likely benign 0.2 cm nodule of the right pulmonary apex. Otherwise, CT showed no other evidence of lymphadenopathy or distant metastatic disease in the chest, abdomen, or pelvis.   -- While body bone scan on 04/27/21 showed no evidence of osseous metastatic  disease. -- Bilateral breast MRI 08/13/2021 showed no abnormal enhancement in the right breast.  Normalization of the right axillary lymph node was also appreciated, measuring 0.8 cm (previously 1.9 cm). No new abnormal lymph nodes were appreciated in the right axilla.   Systemic therapy, if applicable, involved (dates and therapy as follows): the patient has been treated with neoadjuvant chemotherapy consisting of [place] 6 cycles from 05/01/21 through 08/12/2021 under the care of Dr. Lindi Adie. Given her good response to neoadjuvant treatment (detailed above), the patient is now on Herceptin Perjeta maintenance (every 3 weeks); planned duration is for 1 year. Chemo toxicities reported by the patient included diarrhea (from Perjeta), for which she was given Lomotil with improvement, and nausea. 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 She will have a repeat vascular US performed this coming September to reassess this.    The patient opted to proceed with right breast mastectomy with nodal biopsies on 10/13/21 under the care of Dr. Ninfa Linden. Pathology from the procedure revealed: no evidence of residual malignancy; nodal status of 5/5 right axillary SLN excisions negative for carcinoma, and 1/1 targeted right axillary lymph node excision negative for malignancy (with no lymph node or lymph node remnant present). The patient also underwent prepectoral TE/ADM (Alloderm) reconstructive surgery on this date with Dr. Iran Planas.   Post-op, the patient reported that one of the expanders deflated on 10/22/21. She also endorsed increased axillary pain. She called Dr. Iran Planas the following day who placed her on antibiotics prophylactically and recommended follow-up with general surgery for possible axillary seroma. Accordingly, she presented to general surgery on 10/27/21. Physical exam performed  revealed a painful  axillary seroma, which noticeably decreased her range of motion. This was promptly aspirated yielding 46 cc's of noninfected fluid. This was aspirated by Dr. Ninfa Linden again on 11/02/21 yielding 20 cc of fluid.     During her most recent follow-up visit with Dr. Lindi Adie on 10/21/21, the patient reported feeling great and denied any concerns.   Symptomatically, the patient reports: ***        ALLERGIES:  has No Known Allergies.  Meds: Current Outpatient Medications  Medication Sig Dispense Refill   Homeopathic Products (FRANKINCENSE UPLIFTING) OIL Apply 2 drops topically.     Lactobacillus Rhamnosus, GG, (CULTURELLE) CAPS Take 1 capsule by mouth daily.     lidocaine-prilocaine (EMLA) cream Apply to affected area once 30 g 3   rivaroxaban (XARELTO) 20 MG TABS tablet Take 1 tablet (20 mg total) by mouth daily with supper. 30 tablet 6   No current facility-administered medications for this encounter.    Physical Findings:  vitals were not taken for this visit. .     General: Alert and oriented, in no acute distress HEENT: Head is normocephalic. Extraocular movements are intact. Oropharynx is clear. Neck: Neck is supple, no palpable cervical or supraclavicular lymphadenopathy. Heart: Regular in rate and rhythm with no murmurs, rubs, or gallops. Chest: Clear to auscultation bilaterally, with no rhonchi, wheezes, or rales. Abdomen: Soft, nontender, nondistended, with no rigidity or guarding. Extremities: No cyanosis or edema. Lymphatics: see Neck Exam Musculoskeletal: symmetric strength and muscle tone throughout. Neurologic: No obvious focalities. Speech is fluent.  Psychiatric: Judgment and insight are intact. Affect is appropriate. Breast exam reveals ***  Lab Findings: Lab Results  Component Value Date   WBC 5.2 10/29/2021   HGB 12.1 10/29/2021   HCT 34.9 (L) 10/29/2021   MCV 91.6 10/29/2021   PLT 200 10/29/2021    @LASTCHEMISTRY @  Radiographic Findings: No results  found.  Impression/Plan: We discussed adjuvant radiotherapy today.  I recommend *** in order to ***.  I reviewed the logistics, benefits, risks, and potential side effects of this treatment in detail. Risks may include but not necessary be limited to acute and late injury tissue in the radiation fields such as skin irritation (change in color/pigmentation, itching, dryness, pain, peeling). She may experience fatigue. We also discussed possible risk of long term cosmetic changes or scar tissue. There is also a smaller risk for lung toxicity, ***cardiac toxicity, ***brachial plexopathy, ***lymphedema, ***musculoskeletal changes, ***rib fragility or ***induction of a second malignancy, ***late chronic non-healing soft tissue wound.    The patient asked good questions which I answered to her satisfaction. She is enthusiastic about proceeding with treatment. A consent form has been *** signed and placed in her chart.  A total of *** medically necessary complex treatment devices will be fabricated and supervised by me: *** fields with MLCs for custom blocks to protect heart, and lungs;  and, a Vac-lok. MORE COMPLEX DEVICES MAY BE MADE IN DOSIMETRY FOR FIELD IN FIELD BEAMS FOR DOSE HOMOGENEITY.  I have requested : 3D Simulation which is medically necessary to give adequate dose to at risk tissues while sparing lungs and heart.  I have requested a DVH of the following structures: lungs, heart, *** lumpectomy cavity.    The patient will receive *** Gy in *** fractions to the *** with *** fields.  This will be *** followed by a boost.  On date of service, in total, I spent *** minutes on this encounter. Patient was seen in person.  _____________________________________   Eppie Gibson, MD  This document serves as a record of services personally performed by Eppie Gibson, MD. It was created on her behalf by Roney Mans, a trained medical scribe. The creation of this record is based on the scribe's personal  observations and the provider's statements to them. This document has been checked and approved by the attending provider.

## 2021-11-09 NOTE — Progress Notes (Signed)
Location of Breast Cancer:  Malignant neoplasm of upper-outer quadrant of right breast in female, estrogen receptor negative  Histology per Pathology Report:  10/13/2021 FINAL MICROSCOPIC DIAGNOSIS:  A.   BREAST, RIGHT, MASTECTOMY:  -    No residual malignancy identified (no invasive carcinoma or DCIS identified).  -    Prior tumor bed with fibrosis and calcifications (neoadjuvant therapy related).  -    Prior biopsy site changes.  B.   LYMPH NODE, RIGHT AXILLA WITH TARGETED NODE, EXCISION:  -    Fibroadipose tissue and focal glandular breast parenchyma, with no malignancy identified.  -    No lymph node or lymph node remnant present (tissue entirely submitted for microscopic examination (See Specimen D which contains targeted lymph node).  C.   LYMPH NODE, RIGHT AXILLA, SENTINEL, EXCISION:  -    Lymph node with no malignancy identified (0/1).  D.   LYMPH NODE, RIGHT AXILLA, SENTINEL, EXCISION:  -    Lymph node with prior biopsy site changes and therapy-related changes.  -    No residual malignancy identified (0/1).  E.   LYMPH NODE, RIGHT AXILLA, SENTINEL, EXCISION:  -    Lymph node with no malignancy identified (0/1).  F.   LYMPH NODE, RIGHT AXILLA, SENTINEL, EXCISION:  -    Lymph node with no malignancy identified (0/1).  G.   LYMPH NODE, RIGHT AXILLA, SENTINEL, EXCISION:  -    Lymph node with no malignancy identified (0/1).   04/08/2021 1. Breast, right, needle core biopsy, right breast mass/calcs, 10:00 4 cmfn - INVASIVE DUCTAL CARCINOMA - SEE COMMENT 2. Lymph node, needle/core biopsy, right axillary lymph node - METASTATIC CARCINOMA WITH NECROSIS INVOLVING NODAL TISSUE  Receptor Status:  Right breast biopsy: ER(Negative), PR (Negative), Her2-neu (Positive), Ki-67(30%) Right axillary lymph node: ER(Negative), PR (Negative), Her2-neu (Positive), Ki-67(40%)  Past/Anticipated interventions by surgeon, if any:  11/09/2021 Dr. Coralie Keens (office visit)  --Physical Exam   There is some erythema lower in the axilla and there is a recurrent seroma.  After obtaining consent, I anesthetized there with lidocaine and then inserted an 18-gauge needle and drained 32 cc of seroma fluid.  There was no purulence. --Assessment and Plan:  Because of the erythema in the axilla, I am Georgina Peer start her on antibiotics and I will see her back this Friday reexamine the area to see if any further fluid needs to be aspirated and to make sure the erythema/cellulitis is resolving.  She will call sooner should the erythema worsen  11/04/2021 Dr. Irene Limbo (office visit) --Plan:  On Xarelto.  Plan [place] Sep 2023 to assess clot per Dr. Lindi Adie. Saline exchange today.  Refer to PT.  Ok to increase activities as tolerated with exception no swimming yet.  Has beach trip upcoming and anticipate will be ok then.  No underwire x 6 w post op otherwise bra as desired.  I am ok to stopping after today's expansion.  Patient desires larger volume than native breast and understands soft tissue will not reliably expand post expansion.  She will let me know if she desires additional expansion next week.  --F/u week  10/13/2021 --Dr. Coralie Keens RIGHT MASTECTOMY TARGETED RIGHT AXILLARY LYMPH NODE DISSECTION INJECTION OF MAGTRACE FOR LYMPH NODE MAPPING --Dr. Arnoldo Hooker [place]  Right breast reconstruction with tissue expander  Acellular dermis (Alloderm) to right chest 275 cm2  Past/Anticipated interventions by medical oncology, if any:  Under care of Dr. Nicholas Lose 10/21/2021 --Recommendation: Neoadjuvant chemotherapy with TCH Perjeta 6 cycles  completed 08/12/2021  Followed by Herceptin Perjeta maintenance for 1 year Right mastectomy with reconstruction 10/13/2021 No residual malignancy complete pathologic response 0/5 lymph nodes negative Followed by adjuvant radiation therapy  Patient is questioning the role of radiation therapy in the setting of a pathologic complete  response.   She also wants to consider proton beam therapy --Treatment plan: Based on complete pathologic response, I recommend continuation of Herceptin Perjeta maintenance. Return to clinic every 3 weeks for Herceptin Perjeta and every 6 weeks for follow-up with me. She is undecided about adjuvant radiation and will think about it and inform us of her decision.   Lymphedema issues, if any:  Denies. Reports she got a call from PT office to have evaluation before she starts radiation    Pain issues, if any:  Yes, discomfort and tenderness from infection and recent seroma draining  SAFETY ISSUES: Prior radiation? No Pacemaker/ICD? No Possible current pregnancy? No--has not had menstrual cycle since receiving chemotherapy Urine pregnancy 10/13/21: Negative Is the patient on methotrexate? 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

## 2021-11-10 ENCOUNTER — Ambulatory Visit: Payer: Commercial Managed Care - PPO

## 2021-11-10 ENCOUNTER — Other Ambulatory Visit: Payer: Self-pay

## 2021-11-10 ENCOUNTER — Ambulatory Visit: Payer: Commercial Managed Care - PPO | Admitting: Radiation Oncology

## 2021-11-10 ENCOUNTER — Encounter: Payer: Self-pay | Admitting: Radiation Oncology

## 2021-11-10 ENCOUNTER — Ambulatory Visit
Admission: RE | Admit: 2021-11-10 | Discharge: 2021-11-10 | Disposition: A | Discharge status: Home / Self Care | Payer: Commercial Managed Care - PPO | Source: Ambulatory Visit | Attending: Radiation Oncology | Admitting: Radiation Oncology

## 2021-11-10 VITALS — BP 115/77 | HR 94 | Temp 98.1°F | Resp 18 | Ht 69.0 in | Wt 155.4 lb

## 2021-11-10 DIAGNOSIS — Z171 Estrogen receptor negative status [ER-]: Secondary | ICD-10-CM

## 2021-11-10 DIAGNOSIS — C50411 Malignant neoplasm of upper-outer quadrant of right female breast: Secondary | ICD-10-CM | POA: Insufficient documentation

## 2021-11-10 DIAGNOSIS — Z9011 Acquired absence of right breast and nipple: Secondary | ICD-10-CM | POA: Insufficient documentation

## 2021-11-10 DIAGNOSIS — Z7901 Long term (current) use of anticoagulants: Secondary | ICD-10-CM | POA: Insufficient documentation

## 2021-11-10 LAB — PREGNANCY, URINE: Preg Test, Ur: NEGATIVE

## 2021-11-11 ENCOUNTER — Encounter: Payer: Self-pay | Admitting: Physical Therapy

## 2021-11-11 ENCOUNTER — Encounter: Payer: Self-pay | Admitting: Radiation Oncology

## 2021-11-11 ENCOUNTER — Encounter: Payer: Self-pay | Admitting: *Deleted

## 2021-11-11 ENCOUNTER — Ambulatory Visit: Payer: Commercial Managed Care - PPO | Attending: Surgery | Admitting: Physical Therapy

## 2021-11-11 DIAGNOSIS — R293 Abnormal posture: Secondary | ICD-10-CM | POA: Insufficient documentation

## 2021-11-11 DIAGNOSIS — R600 Localized edema: Secondary | ICD-10-CM | POA: Insufficient documentation

## 2021-11-11 DIAGNOSIS — Z171 Estrogen receptor negative status [ER-]: Secondary | ICD-10-CM | POA: Insufficient documentation

## 2021-11-11 DIAGNOSIS — C50411 Malignant neoplasm of upper-outer quadrant of right female breast: Secondary | ICD-10-CM | POA: Diagnosis present

## 2021-11-11 DIAGNOSIS — Z483 Aftercare following surgery for neoplasm: Secondary | ICD-10-CM | POA: Insufficient documentation

## 2021-11-11 NOTE — Therapy (Signed)
OUTPATIENT PHYSICAL THERAPY BREAST CANCER POST OP FOLLOW UP   Patient Name: Tara White MRN: [id number] DOB:05/15/1990, 31 y.o., female Today's Date: 11/11/2021   PT End of Session - 11/11/21 1503     Visit Number 2    Number of Visits 6    Date for PT Re-Evaluation 12/09/21    PT Start Time 1502    PT Stop Time 1545    PT Time Calculation (min) 43 min    Activity Tolerance Patient tolerated treatment well    Behavior During Therapy [facility] for tasks assessed/performed             Past Medical History:  Diagnosis Date   DVT (deep venous thrombosis) ([place]) 06/25/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   Family history of breast cancer 04/21/2021   right breast ca 03/2021   Past Surgical History:  Procedure Laterality Date   BREAST RECONSTRUCTION WITH PLACEMENT OF TISSUE EXPANDER AND ALLODERM Right 10/13/2021   Procedure: RIGHT BREAST RECONSTRUCTION WITH PLACEMENT OF TISSUE EXPANDER AND ALLODERM;  Surgeon: Irene Limbo, MD;  Location: [place];  Service: Plastics;  Laterality: Right;   CESAREAN SECTION N/A 12/10/2015   Procedure: CESAREAN SECTION;  Surgeon: Aloha Gell, MD;  Location: [place];  Service: Obstetrics;  Laterality: N/A;   IR CV LINE INJECTION  06/25/2021   IR RADIOLOGY PERIPHERAL GUIDED IV START  06/25/2021   IR US GUIDE VASC ACCESS LEFT  06/25/2021   IR VENO/EXT/UNI LEFT  06/25/2021   NODE DISSECTION Right 10/13/2021   Procedure: TARGETED LYMPH NODE DISSECTION;  Surgeon: Coralie Keens, MD;  Location: [place];  Service: General;  Laterality: Right;   PORTACATH PLACEMENT Left 04/30/2021   Procedure: INSERTION PORT-A-CATH;  Surgeon: Coralie Keens, MD;  Location: [place];  Service: General;  Laterality: Left;   SIMPLE MASTECTOMY WITH AXILLARY SENTINEL NODE BIOPSY Right 10/13/2021   Procedure: RIGHT MASTECTOMY;  Surgeon: Coralie Keens, MD;  Location: [place];  Service: General;   Laterality: Right;   [place] EXTRACTION     Patient Active Problem List   Diagnosis Date Noted   Breast cancer, right ([place]) 10/13/2021   Port-A-Cath in place 07/17/2021   Family history of breast cancer 04/21/2021   Genetic testing 04/21/2021   Malignant neoplasm of upper-outer quadrant of right breast in female, estrogen receptor negative ([place]) 04/14/2021    PCP: [facility]  REFERRING PROVIDER: Coralie Keens, MD   REFERRING DIAG: Right breast cancer  THERAPY DIAG:  Aftercare following surgery for neoplasm  Localized edema  Malignant neoplasm of upper-outer quadrant of right breast in female, estrogen receptor negative ([place])  Rationale for Evaluation and Treatment Rehabilitation  ONSET DATE: 04/08/21  SUBJECTIVE:  SUBJECTIVE STATEMENT: I have gotten significantly better ROM in just a few days. I don't think I need therapy. I think I can do it at home. I had an infected seroma that I am antibiotics for. I just started them 2.5 days ago. Pt reports she had cording but it went away.  PERTINENT HISTORY:  Patient was diagnosed on 04/08/2021 with right grade III invasive ductal carcinoma breast cancer. It measures 3 cm with 10.8 cm of calcifications and is located in the upper outer quadrant. It is ER/PR negative and HER2 positive with a Ki67 of 30%. She has a biopsied positive axillary lymph node. 10/13/21- R mastectomy and SLNB (0/5)with reconstruction and expander placed  PATIENT GOALS:  Reassess how my recovery is going related to arm function, pain, and swelling.  PAIN:  Are you having pain? No pt reports periodically she had pain  PRECAUTIONS: Recent Surgery, right UE Lymphedema risk,   ACTIVITY LEVEL / LEISURE: pt has not returned to exercise since surgery but has trouble  finding time to exercise due to having 3 small children   OBJECTIVE:   PATIENT SURVEYS:  QUICK DASH:  Quick Dash - 11/11/21 0001     Open a tight or new jar Mild difficulty    Do heavy household chores (wash walls, wash floors) Moderate difficulty    Carry a shopping bag or briefcase Mild difficulty    Wash your back Moderate difficulty    Use a knife to cut food Mild difficulty    Recreational activities in which you take some force or impact through your arm, shoulder, or hand (golf, hammering, tennis) Moderate difficulty    During the past week, to what extent has your arm, shoulder or hand problem interfered with your normal social activities with family, friends, neighbors, or groups? Not at all    During the past week, to what extent has your arm, shoulder or hand problem limited your work or other regular daily activities Slightly    Arm, shoulder, or hand pain. Mild    Tingling (pins and needles) in your arm, shoulder, or hand None    Difficulty Sleeping Mild difficulty    DASH Score 27.27 %              OBSERVATIONS:  Redness in area of seroma from infection that pt has been on antibiotics for 2.5 days  POSTURE:  Forward head and rounded shoulders posture   UPPER EXTREMITY AROM/PROM:   A/PROM Right 04/15/2021 Left 04/15/2021 Right 11/11/21  Shoulder extension 36 45 63  Shoulder flexion 169 155 165  Shoulder abduction 177 173 179  Shoulder internal rotation 75 68 76  Shoulder external rotation 83 84 90                          (Blank rows = not tested)       CERVICAL AROM: All within normal limits   UPPER EXTREMITY STRENGTH: WNL     LYMPHEDEMA ASSESSMENTS:    LANDMARK RIGHT 04/15/2021 LEFT 04/15/2021 RIGHT  11/11/21 LEFT 11/11/21  10 cm proximal to olecranon process 26.1 26.8 25.4 26.5  Olecranon process 23.3 24.3 23.5 24.5  10 cm proximal to ulnar styloid process 21.6 21.4 20 20.9  Just proximal to ulnar styloid process 15.5 15.7 15.6 16.2  Across  hand at thumb web space 19.2 19.6 19 20.1  At base of 2nd digit 6.2 6.3 6 6.2  (Blank rows = not tested)  Surgery type/Date: R mastectomy 10/13/21 Number of lymph nodes removed: 0/5 Current/past treatment (chemo, radiation, hormone therapy): pt completed neoadjuvant chemo, will require radiation, pt will not require hormone therapy Other symptoms:  Heaviness/tightness No Pain Yes Pitting edema No Infections Yes infected seroma- currently on antibiotics Decreased scar mobility Yes Stemmer sign No TREATMENT  11/11/21- gentle MFR to cording in R axilla with approximately 5 cords palpable that improved greatly with manual therapy today, cut 1/2 grey foam and placed in TG soft for pt to wear in her compression bra for additional compression  PATIENT EDUCATION:  Education details: axillary cording, seroma, compression bra Person educated: Patient Education method: Explanation Education comprehension: verbalized understanding   HOME EXERCISE PROGRAM:  Reviewed previously given post op HEP. Continue to stretch at end range, wear compression bra with foam pad for additional compression to help seroma heal  ASSESSMENT:  CLINICAL IMPRESSION: Pt returns to PT after undergoing neoadjuvant chemo and then undergoing a R mastectomy and SLNB on 10/13/21. Pt has returned to baseline ROM but has discomfort due to axillary cording. She has approximately 5 cords present in her R axilla. Began myofascial release to this area today and they improved greatly. Pt educated to continue end range stretching at home to help reduce cording. She also has a seroma that is improving after a recent infection. Pt would benefit from skilled PT services to decrease cording to allow improved comfort and to instruct pt on how to manage her seroma.   Pt will benefit from skilled therapeutic intervention to improve on the following deficits: Decreased knowledge of precautions, impaired UE functional use, pain, decreased  ROM, postural dysfunction.   PT treatment/interventions: ADL/Self care home management, Therapeutic exercises, Therapeutic activity, Patient/Family education, Self Care, Orthotic/Fit training, Manual lymph drainage, Taping, and Manual therapy     GOALS: Goals reviewed with patient? Yes  LONG TERM GOALS:  (STG=LTG)  GOALS Name Target Date  Goal status  1 Pt will demonstrate she has regained full shoulder ROM and function post operatively compared to baselines.  Baseline: 11/11/21 MET  2 Pt will be able to raise her arm in all directions at end range without discomfort from axillary cording.  12/09/2021 INITIAL  3     4        PLAN: PT FREQUENCY/DURATION: 1x/wk for 4 weeks  PLAN FOR NEXT SESSION: give post op handout, MFR to cording, how was grey foam in compression bra, how is seroma?   [facility]  [address], [address]  [place] [address]  [phone]  After Breast Cancer Class It is recommended you attend the ABC class to be educated on lymphedema risk reduction. This class is free of charge and lasts for 1 hour. It is a 1-time class. You will need to download the Webex app either on your phone or computer. We will send you a link the night before or the morning of the class. You should be able to click on that link to join the class. This is not a confidential class. You don't have to turn your camera on, but other participants may be able to see your email address.  Scar massage You can begin gentle scar massage to you incision sites. Gently place one hand on the incision and move the skin (without sliding on the skin) in various directions. Do this for a few minutes and then you can gently massage either coconut oil or vitamin E cream into the scars.  Compression garment You  should continue wearing your compression bra until you feel like you no longer have swelling.  Home exercise Program Continue doing the exercises you were given until you  feel like you can do them without feeling any tightness at the end.   Walking Program Studies show that 30 minutes of walking per day (fast enough to elevate your heart rate) can significantly reduce the risk of a cancer recurrence. If you can't walk due to other medical reasons, we encourage you to find another activity you could do (like a stationary bike or water exercise).  Posture After breast cancer surgery, people frequently sit with rounded shoulders posture because it puts their incisions on slack and feels better. If you sit like this and scar tissue forms in that position, you can become very tight and have pain sitting or standing with good posture. Try to be aware of your posture and sit and stand up tall to heal properly.  Follow up PT: It is recommended you return every 3 months for the first 3 years following surgery to be assessed on the SOZO machine for an L-Dex score. This helps prevent clinically significant lymphedema in 95% of patients. These follow up screens are 10 minute appointments that you are not billed for.  [facility], PT 11/11/2021, 3:54 PM

## 2021-11-13 ENCOUNTER — Telehealth: Payer: Self-pay | Admitting: *Deleted

## 2021-11-13 NOTE — Telephone Encounter (Signed)
Pt called to make office aware that recent pink eye diagnosis caused vision to diminish per her ophthalmologist. Pt will have doctor to fax over report.

## 2021-11-24 ENCOUNTER — Ambulatory Visit
Admission: RE | Admit: 2021-11-24 | Discharge: 2021-11-24 | Disposition: A | Discharge status: Home / Self Care | Payer: Commercial Managed Care - PPO | Source: Ambulatory Visit | Attending: Radiation Oncology | Admitting: Radiation Oncology

## 2021-11-24 ENCOUNTER — Other Ambulatory Visit: Payer: Self-pay

## 2021-11-24 DIAGNOSIS — C50411 Malignant neoplasm of upper-outer quadrant of right female breast: Secondary | ICD-10-CM | POA: Diagnosis present

## 2021-11-24 DIAGNOSIS — Z171 Estrogen receptor negative status [ER-]: Secondary | ICD-10-CM | POA: Diagnosis not present

## 2021-11-25 ENCOUNTER — Encounter: Payer: Self-pay | Admitting: Rehabilitation

## 2021-11-25 ENCOUNTER — Ambulatory Visit: Payer: Commercial Managed Care - PPO | Admitting: Rehabilitation

## 2021-11-25 DIAGNOSIS — Z483 Aftercare following surgery for neoplasm: Secondary | ICD-10-CM | POA: Diagnosis not present

## 2021-11-25 DIAGNOSIS — R293 Abnormal posture: Secondary | ICD-10-CM

## 2021-11-25 DIAGNOSIS — C50411 Malignant neoplasm of upper-outer quadrant of right female breast: Secondary | ICD-10-CM

## 2021-11-25 DIAGNOSIS — R600 Localized edema: Secondary | ICD-10-CM

## 2021-11-25 NOTE — Therapy (Signed)
OUTPATIENT PHYSICAL THERAPY BREAST CANCER TREATMENT   Patient Name: Tara White MRN: [id number] DOB:Sep 09, 1990, 31 y.o., female Today's Date: 11/25/2021   PT End of Session - 11/25/21 1304     Visit Number 3    Number of Visits 6    Date for PT Re-Evaluation 12/09/21    PT Start Time 5456    PT Stop Time 1346    PT Time Calculation (min) 41 min    Activity Tolerance Patient tolerated treatment well    Behavior During Therapy [facility] for tasks assessed/performed             Past Medical History:  Diagnosis Date   DVT (deep venous thrombosis) ([place]) 06/25/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   Family history of breast cancer 04/21/2021   right breast ca 03/2021   Past Surgical History:  Procedure Laterality Date   BREAST RECONSTRUCTION WITH PLACEMENT OF TISSUE EXPANDER AND ALLODERM Right 10/13/2021   Procedure: RIGHT BREAST RECONSTRUCTION WITH PLACEMENT OF TISSUE EXPANDER AND ALLODERM;  Surgeon: Irene Limbo, MD;  Location: [place];  Service: Plastics;  Laterality: Right;   CESAREAN SECTION N/A 12/10/2015   Procedure: CESAREAN SECTION;  Surgeon: Aloha Gell, MD;  Location: [place];  Service: Obstetrics;  Laterality: N/A;   IR CV LINE INJECTION  06/25/2021   IR RADIOLOGY PERIPHERAL GUIDED IV START  06/25/2021   IR US GUIDE VASC ACCESS LEFT  06/25/2021   IR VENO/EXT/UNI LEFT  06/25/2021   NODE DISSECTION Right 10/13/2021   Procedure: TARGETED LYMPH NODE DISSECTION;  Surgeon: Coralie Keens, MD;  Location: [place];  Service: General;  Laterality: Right;   PORTACATH PLACEMENT Left 04/30/2021   Procedure: INSERTION PORT-A-CATH;  Surgeon: Coralie Keens, MD;  Location: [place];  Service: General;  Laterality: Left;   SIMPLE MASTECTOMY WITH AXILLARY SENTINEL NODE BIOPSY Right 10/13/2021   Procedure: RIGHT MASTECTOMY;  Surgeon: Coralie Keens, MD;  Location: [place];  Service: General;  Laterality:  Right;   [place] EXTRACTION     Patient Active Problem List   Diagnosis Date Noted   Breast cancer, right ([place]) 10/13/2021   Port-A-Cath in place 07/17/2021   Family history of breast cancer 04/21/2021   Genetic testing 04/21/2021   Malignant neoplasm of upper-outer quadrant of right breast in female, estrogen receptor negative ([place]) 04/14/2021    PCP: [facility]  REFERRING PROVIDER: Coralie Keens, MD   REFERRING DIAG: Right breast cancer  THERAPY DIAG:  Aftercare following surgery for neoplasm  Localized edema  Malignant neoplasm of upper-outer quadrant of right breast in female, estrogen receptor negative ([place])  Abnormal posture  Rationale for Evaluation and Treatment Rehabilitation  ONSET DATE: 04/08/21  SUBJECTIVE:  SUBJECTIVE STATEMENT: Radiation simulation was fine.    PERTINENT HISTORY:  Patient was diagnosed on 04/08/2021 with right grade III invasive ductal carcinoma breast cancer. It measures 3 cm with 10.8 cm of calcifications and is located in the upper outer quadrant. It is ER/PR negative and HER2 positive with a Ki67 of 30%. She has a biopsied positive axillary lymph node. 10/13/21- R mastectomy and SLNB (0/5)with reconstruction and expander placed  PATIENT GOALS:  Reassess how my recovery is going related to arm function, pain, and swelling.  PAIN:  Are you having pain? No pt reports periodically she had pain  PRECAUTIONS: Recent Surgery, right UE Lymphedema risk,   ACTIVITY LEVEL / LEISURE: pt has not returned to exercise since surgery but has trouble finding time to exercise due to having 3 small children   OBJECTIVE:  OBSERVATIONS:  Redness in area of seroma from infection that pt has been on antibiotics for 2.5 days  POSTURE:  Forward head and  rounded shoulders posture   UPPER EXTREMITY AROM/PROM:   A/PROM Right 04/15/2021 Left 04/15/2021 Right 11/11/21  Shoulder extension 36 45 63  Shoulder flexion 169 155 165  Shoulder abduction 177 173 179  Shoulder internal rotation 75 68 76  Shoulder external rotation 83 84 90                          (Blank rows = not tested)       CERVICAL AROM: All within normal limits   UPPER EXTREMITY STRENGTH: WNL     LYMPHEDEMA ASSESSMENTS:    LANDMARK RIGHT 04/15/2021 LEFT 04/15/2021 RIGHT  11/11/21 LEFT 11/11/21  10 cm proximal to olecranon process 26.1 26.8 25.4 26.5  Olecranon process 23.3 24.3 23.5 24.5  10 cm proximal to ulnar styloid process 21.6 21.4 20 20.9  Just proximal to ulnar styloid process 15.5 15.7 15.6 16.2  Across hand at thumb web space 19.2 19.6 19 20.1  At base of 2nd digit 6.2 6.3 6 6.2  (Blank rows = not tested)      Surgery type/Date: R mastectomy 10/13/21 Number of lymph nodes removed: 0/5 Current/past treatment (chemo, radiation, hormone therapy): pt completed neoadjuvant chemo, will require radiation, pt will not require hormone therapy Other symptoms:  Heaviness/tightness No Pain Yes Pitting edema No Infections Yes infected seroma- currently on antibiotics Decreased scar mobility Yes Stemmer sign No  TREATMENT 11/25/21: MFR to cording in Rt axilla and following upper arm towards elbow.  Able to palpate 2 cords in the axilla and 1 more along the pectoralis border.  Discussed lymphedema and risk reduction - declined ABC class.  Discussed home stretches during radiation and after and scheduled out SOZO.   11/11/21- gentle MFR to cording in R axilla with approximately 5 cords palpable that improved greatly with manual therapy today, cut 1/2 grey foam and placed in TG soft for pt to wear in her compression bra for additional compression  PATIENT EDUCATION:  Education details: axillary cording, seroma, compression bra Person educated: Patient Education  method: Explanation Education comprehension: verbalized understanding   HOME EXERCISE PROGRAM:  Reviewed previously given post op HEP. Continue to stretch at end range, wear compression bra with foam pad for additional compression to help seroma heal  ASSESSMENT:  CLINICAL IMPRESSION: Pt has full ROM but still with the presence of cording with palpation.  Due to starting radiation and returning to home schooling pt will continue with home stretches, SOZO, and returns as needed.  Pt will benefit from skilled therapeutic intervention to improve on the following deficits: Decreased knowledge of precautions, impaired UE functional use, pain, decreased ROM, postural dysfunction.   PT treatment/interventions: ADL/Self care home management, Therapeutic exercises, Therapeutic activity, Patient/Family education, Self Care, Orthotic/Fit training, Manual lymph drainage, Taping, and Manual therapy   GOALS: Goals reviewed with patient? Yes  LONG TERM GOALS:  (STG=LTG)  GOALS Name Target Date  Goal status  1 Pt will demonstrate she has regained full shoulder ROM and function post operatively compared to baselines.  Baseline: 11/11/21 MET  2 Pt will be able to raise her arm in all directions at end range without discomfort from axillary cording.  12/09/2021 MET  3     4        PLAN: PT FREQUENCY/DURATION: 1x/wk for 4 weeks  PLAN FOR NEXT SESSION: give post op handout, MFR to cording, how was grey foam in compression bra, how is seroma?   [facility]  [address], [address]  [place] [address]  [phone]  After Breast Cancer Class It is recommended you attend the ABC class to be educated on lymphedema risk reduction. This class is free of charge and lasts for 1 hour. It is a 1-time class. You will need to download the Webex app either on your phone or computer. We will send you a link the night before or the morning of the class. You should be able to click on  that link to join the class. This is not a confidential class. You don't have to turn your camera on, but other participants may be able to see your email address.  Scar massage You can begin gentle scar massage to you incision sites. Gently place one hand on the incision and move the skin (without sliding on the skin) in various directions. Do this for a few minutes and then you can gently massage either coconut oil or vitamin E cream into the scars.  Compression garment You should continue wearing your compression bra until you feel like you no longer have swelling.  Home exercise Program Continue doing the exercises you were given until you feel like you can do them without feeling any tightness at the end.   Walking Program Studies show that 30 minutes of walking per day (fast enough to elevate your heart rate) can significantly reduce the risk of a cancer recurrence. If you can't walk due to other medical reasons, we encourage you to find another activity you could do (like a stationary bike or water exercise).  Posture After breast cancer surgery, people frequently sit with rounded shoulders posture because it puts their incisions on slack and feels better. If you sit like this and scar tissue forms in that position, you can become very tight and have pain sitting or standing with good posture. Try to be aware of your posture and sit and stand up tall to heal properly.  Follow up PT: It is recommended you return every 3 months for the first 3 years following surgery to be assessed on the SOZO machine for an L-Dex score. This helps prevent clinically significant lymphedema in 95% of patients. These follow up screens are 10 minute appointments that you are not billed for.  Stark Bray, PT 11/25/2021, 1:53 PM

## 2021-11-26 ENCOUNTER — Inpatient Hospital Stay: Payer: Commercial Managed Care - PPO

## 2021-11-26 ENCOUNTER — Other Ambulatory Visit: Payer: Self-pay

## 2021-11-26 VITALS — BP 118/64 | HR 73 | Temp 98.4°F | Resp 18 | Wt 154.8 lb

## 2021-11-26 DIAGNOSIS — C50411 Malignant neoplasm of upper-outer quadrant of right female breast: Secondary | ICD-10-CM | POA: Diagnosis not present

## 2021-11-26 DIAGNOSIS — Z171 Estrogen receptor negative status [ER-]: Secondary | ICD-10-CM

## 2021-11-26 MED ORDER — ACETAMINOPHEN 325 MG PO TABS
650.0000 mg | ORAL_TABLET | Freq: Once | ORAL | Status: AC
  Administered 2021-11-26: 650 mg via ORAL
  Filled 2021-11-26: qty 2

## 2021-11-26 MED ORDER — DIPHENHYDRAMINE HCL 25 MG PO CAPS
50.0000 mg | ORAL_CAPSULE | Freq: Once | ORAL | Status: AC
  Administered 2021-11-26: 50 mg via ORAL
  Filled 2021-11-26: qty 2

## 2021-11-26 MED ORDER — SODIUM CHLORIDE 0.9 % IV SOLN: Freq: Once | INTRAVENOUS | Status: AC

## 2021-11-26 MED ORDER — SODIUM CHLORIDE 0.9% FLUSH
10.0000 mL | INTRAVENOUS | Status: DC | PRN
  Administered 2021-11-26: 10 mL

## 2021-11-26 MED ORDER — HEPARIN SOD (PORK) LOCK FLUSH 100 UNIT/ML IV SOLN
500.0000 [IU] | Freq: Once | INTRAVENOUS | Status: AC | PRN
  Administered 2021-11-26: 500 [IU]

## 2021-11-26 MED ORDER — SODIUM CHLORIDE 0.9 % IV SOLN
420.0000 mg | Freq: Once | INTRAVENOUS | Status: AC
  Administered 2021-11-26: 420 mg via INTRAVENOUS
  Filled 2021-11-26: qty 14

## 2021-11-26 MED ORDER — TRASTUZUMAB-ANNS CHEMO 150 MG IV SOLR
6.0000 mg/kg | Freq: Once | INTRAVENOUS | Status: AC
  Administered 2021-11-26: 420 mg via INTRAVENOUS
  Filled 2021-11-26: qty 20

## 2021-11-26 NOTE — Progress Notes (Signed)
Patient does not need labs for today's treatment per Dr. Lindi Adie.

## 2021-11-26 NOTE — Patient Instructions (Signed)
[facility] MEDICAL ONCOLOGY  Discharge Instructions: Thank you for choosing [facility] to provide your oncology and hematology care.   If you have a lab appointment with the Cancer Center, please go directly to the Cancer Center and check in at the registration area.   Wear comfortable clothing and clothing appropriate for easy access to any Portacath or PICC line.   We strive to give you quality time with your provider. You may need to reschedule your appointment if you arrive late (15 or more minutes).  Arriving late affects you and other patients whose appointments are after yours.  Also, if you miss three or more appointments without notifying the office, you may be dismissed from the clinic at the provider's discretion.      For prescription refill requests, have your pharmacy contact our office and allow 72 hours for refills to be completed.    Today you received the following chemotherapy and/or immunotherapy agents: Kanjinti/Perjeta      To help prevent nausea and vomiting after your treatment, we encourage you to take your nausea medication as directed.  BELOW ARE SYMPTOMS THAT SHOULD BE REPORTED IMMEDIATELY: *FEVER GREATER THAN 100.4 F (38 C) OR HIGHER *CHILLS OR SWEATING *NAUSEA AND VOMITING THAT IS NOT CONTROLLED WITH YOUR NAUSEA MEDICATION *UNUSUAL SHORTNESS OF BREATH *UNUSUAL BRUISING OR BLEEDING *URINARY PROBLEMS (pain or burning when urinating, or frequent urination) *BOWEL PROBLEMS (unusual diarrhea, constipation, pain near the anus) TENDERNESS IN MOUTH AND THROAT WITH OR WITHOUT PRESENCE OF ULCERS (sore throat, sores in mouth, or a toothache) UNUSUAL RASH, SWELLING OR PAIN  UNUSUAL VAGINAL DISCHARGE OR ITCHING   Items with * indicate a potential emergency and should be followed up as soon as possible or go to the Emergency Department if any problems should occur.  Please show the CHEMOTHERAPY ALERT CARD or IMMUNOTHERAPY ALERT CARD at  check-in to the Emergency Department and triage nurse.  Should you have questions after your visit or need to cancel or reschedule your appointment, please contact [facility]ICAL [facility]COL[facility]  Dept: [phone]  and follow the prompts.  Office hours are 8:00 a.m. to 4:30 p.m. Monday - Friday. Please note that voicemails left after 4:00 p.m. may not be returned until the following business day.  We are closed weekends and major holidays. You have access to a nurse at all times for urgent questions. Please call the main number to the clinic Dept: [phone] and follow the prompts.   For any non-urgent questions, you may also contact your provider using MyChart. We now offer e-Visits for anyone 18 and older to request care online for non-urgent symptoms. For details visit [email or web address].   Also download the MyChart app! Go to the app store, search "MyChart", open the app, select [facility], and log in with your MyChart username and password.  Masks are optional in the cancer centers. If you would like for your care team to wear a mask while they are taking care of you, please let them know. You may have one support person who is at least 32 years old accompany you for your appointments. 

## 2021-11-26 NOTE — Progress Notes (Signed)
OK to treat with echo from May 2023 per Dr. Lindi Adie

## 2021-12-01 ENCOUNTER — Encounter: Payer: Self-pay | Admitting: *Deleted

## 2021-12-02 ENCOUNTER — Encounter: Payer: Self-pay | Admitting: Hematology and Oncology

## 2021-12-02 ENCOUNTER — Other Ambulatory Visit: Payer: Self-pay | Admitting: Hematology and Oncology

## 2021-12-02 ENCOUNTER — Other Ambulatory Visit: Payer: Self-pay | Admitting: *Deleted

## 2021-12-02 DIAGNOSIS — C50411 Malignant neoplasm of upper-outer quadrant of right female breast: Secondary | ICD-10-CM | POA: Diagnosis present

## 2021-12-02 DIAGNOSIS — R238 Other skin changes: Secondary | ICD-10-CM

## 2021-12-02 DIAGNOSIS — Z171 Estrogen receptor negative status [ER-]: Secondary | ICD-10-CM | POA: Insufficient documentation

## 2021-12-02 NOTE — Progress Notes (Signed)
Per MD request RN successfully faxed referral to Dr. Allyn Kenner [phone]) with dermatology for evaluation of scalp irritation.

## 2021-12-03 ENCOUNTER — Other Ambulatory Visit: Payer: Self-pay

## 2021-12-03 ENCOUNTER — Ambulatory Visit
Admission: RE | Admit: 2021-12-03 | Discharge: 2021-12-03 | Disposition: A | Discharge status: Home / Self Care | Payer: Commercial Managed Care - PPO | Source: Ambulatory Visit | Attending: Radiation Oncology | Admitting: Radiation Oncology

## 2021-12-03 DIAGNOSIS — C50411 Malignant neoplasm of upper-outer quadrant of right female breast: Secondary | ICD-10-CM | POA: Diagnosis not present

## 2021-12-03 LAB — RAD ONC ARIA SESSION SUMMARY

## 2021-12-04 ENCOUNTER — Other Ambulatory Visit: Payer: Self-pay

## 2021-12-04 ENCOUNTER — Ambulatory Visit
Admission: RE | Admit: 2021-12-04 | Discharge: 2021-12-04 | Disposition: A | Discharge status: Home / Self Care | Payer: Commercial Managed Care - PPO | Source: Ambulatory Visit | Attending: Radiation Oncology | Admitting: Radiation Oncology

## 2021-12-04 DIAGNOSIS — C50411 Malignant neoplasm of upper-outer quadrant of right female breast: Secondary | ICD-10-CM | POA: Diagnosis not present

## 2021-12-04 LAB — RAD ONC ARIA SESSION SUMMARY
Course Elapsed Days: 1
Plan Fractions Treated to Date: 1
Plan Fractions Treated to Date: 1
Plan Prescribed Dose Per Fraction: 1.8 Gy
Plan Prescribed Dose Per Fraction: 1.8 Gy
Plan Total Fractions Prescribed: 14
Plan Total Fractions Prescribed: 27
Plan Total Prescribed Dose: 25.2 Gy
Plan Total Prescribed Dose: 48.6 Gy
Reference Point Dosage Given to Date: 3.6 Gy
Reference Point Dosage Given to Date: 3.6 Gy
Reference Point Session Dosage Given: 1.8 Gy
Reference Point Session Dosage Given: 1.8 Gy
Session Number: 2

## 2021-12-07 ENCOUNTER — Ambulatory Visit
Admission: RE | Admit: 2021-12-07 | Discharge: 2021-12-07 | Disposition: A | Discharge status: Home / Self Care | Payer: Commercial Managed Care - PPO | Source: Ambulatory Visit | Attending: Radiation Oncology | Admitting: Radiation Oncology

## 2021-12-07 ENCOUNTER — Ambulatory Visit: Payer: Commercial Managed Care - PPO

## 2021-12-07 ENCOUNTER — Telehealth: Payer: Self-pay | Admitting: Hematology and Oncology

## 2021-12-07 ENCOUNTER — Encounter: Payer: Self-pay | Admitting: Radiation Oncology

## 2021-12-07 ENCOUNTER — Other Ambulatory Visit: Payer: Self-pay

## 2021-12-07 DIAGNOSIS — C50411 Malignant neoplasm of upper-outer quadrant of right female breast: Secondary | ICD-10-CM | POA: Diagnosis not present

## 2021-12-07 DIAGNOSIS — Z171 Estrogen receptor negative status [ER-]: Secondary | ICD-10-CM

## 2021-12-07 LAB — RAD ONC ARIA SESSION SUMMARY
Course Elapsed Days: 4
Plan Fractions Treated to Date: 2
Plan Fractions Treated to Date: 2
Plan Prescribed Dose Per Fraction: 1.8 Gy
Plan Prescribed Dose Per Fraction: 1.8 Gy
Plan Total Fractions Prescribed: 14
Plan Total Fractions Prescribed: 27
Plan Total Prescribed Dose: 25.2 Gy
Plan Total Prescribed Dose: 48.6 Gy
Reference Point Dosage Given to Date: 5.4 Gy
Reference Point Dosage Given to Date: 5.4 Gy
Reference Point Session Dosage Given: 1.8 Gy
Reference Point Session Dosage Given: 1.8 Gy
Session Number: 3

## 2021-12-07 MED ORDER — RADIAPLEXRX EX GEL: Freq: Once | CUTANEOUS | Status: AC

## 2021-12-07 NOTE — Progress Notes (Signed)
Pt here for patient teaching.  Pt given Radiation and You booklet, skin care instructions, and Radiaplex gel.  Reviewed areas of pertinence such as fatigue, skin changes, breast tenderness, and breast swelling . Pt able to give teach back of to pat skin and use unscented/gentle soap,apply Radiaplex bid, avoid applying anything to skin within 4 hours of treatment, and avoid wearing an under wire bra. Pt demonstrated understanding and verbalizes understanding of information given and will contact nursing with any questions or concerns.     [email or web address]/treatmentinformation/whattoexpect/index

## 2021-12-07 NOTE — Telephone Encounter (Signed)
Rash on the face scalp and chest: Concerning for fixed drug eruption from Herceptin and Perjeta. She has a topical clindamycin that she will apply to these areas as well as Benadryl and cortisone creams to apply to the scalp. If none of these worked then we may have to switch her Herceptin and Perjeta to different name brands.

## 2021-12-07 NOTE — Progress Notes (Signed)
       Patient is in her first week of radiotherapy to the right chest wall. She reported a new acneiform rash developed before starting RT on her face/ chest that is not typical for her.  Will share these photos w/ medical oncology.   -----------------------------------  Eppie Gibson, MD

## 2021-12-08 ENCOUNTER — Ambulatory Visit
Admission: RE | Admit: 2021-12-08 | Discharge: 2021-12-08 | Disposition: A | Discharge status: Home / Self Care | Payer: Commercial Managed Care - PPO | Source: Ambulatory Visit | Attending: Radiation Oncology | Admitting: Radiation Oncology

## 2021-12-08 ENCOUNTER — Other Ambulatory Visit: Payer: Self-pay

## 2021-12-08 DIAGNOSIS — C50411 Malignant neoplasm of upper-outer quadrant of right female breast: Secondary | ICD-10-CM | POA: Diagnosis not present

## 2021-12-08 LAB — RAD ONC ARIA SESSION SUMMARY
Course Elapsed Days: 5
Plan Fractions Treated to Date: 2
Plan Fractions Treated to Date: 3
Plan Prescribed Dose Per Fraction: 1.8 Gy
Plan Prescribed Dose Per Fraction: 1.8 Gy
Plan Total Fractions Prescribed: 14
Plan Total Fractions Prescribed: 27
Plan Total Prescribed Dose: 25.2 Gy
Plan Total Prescribed Dose: 48.6 Gy
Reference Point Dosage Given to Date: 7.2 Gy
Reference Point Dosage Given to Date: 7.2 Gy
Reference Point Session Dosage Given: 1.8 Gy
Reference Point Session Dosage Given: 1.8 Gy
Session Number: 4

## 2021-12-09 ENCOUNTER — Other Ambulatory Visit: Payer: Self-pay

## 2021-12-09 ENCOUNTER — Ambulatory Visit
Admission: RE | Admit: 2021-12-09 | Discharge: 2021-12-09 | Disposition: A | Discharge status: Home / Self Care | Payer: Commercial Managed Care - PPO | Source: Ambulatory Visit | Attending: Radiation Oncology | Admitting: Radiation Oncology

## 2021-12-09 DIAGNOSIS — C50411 Malignant neoplasm of upper-outer quadrant of right female breast: Secondary | ICD-10-CM | POA: Diagnosis not present

## 2021-12-09 LAB — RAD ONC ARIA SESSION SUMMARY
Course Elapsed Days: 6
Plan Fractions Treated to Date: 3
Plan Fractions Treated to Date: 4
Plan Prescribed Dose Per Fraction: 1.8 Gy
Plan Prescribed Dose Per Fraction: 1.8 Gy
Plan Total Fractions Prescribed: 14
Plan Total Fractions Prescribed: 27
Plan Total Prescribed Dose: 25.2 Gy
Plan Total Prescribed Dose: 48.6 Gy
Reference Point Dosage Given to Date: 9 Gy
Reference Point Dosage Given to Date: 9 Gy
Reference Point Session Dosage Given: 1.8 Gy
Reference Point Session Dosage Given: 1.8 Gy
Session Number: 5

## 2021-12-10 ENCOUNTER — Ambulatory Visit
Admission: RE | Admit: 2021-12-10 | Discharge: 2021-12-10 | Disposition: A | Discharge status: Home / Self Care | Payer: Commercial Managed Care - PPO | Source: Ambulatory Visit | Attending: Radiation Oncology | Admitting: Radiation Oncology

## 2021-12-10 ENCOUNTER — Other Ambulatory Visit: Payer: Self-pay

## 2021-12-10 DIAGNOSIS — C50411 Malignant neoplasm of upper-outer quadrant of right female breast: Secondary | ICD-10-CM | POA: Diagnosis not present

## 2021-12-10 LAB — RAD ONC ARIA SESSION SUMMARY
Course Elapsed Days: 7
Plan Fractions Treated to Date: 3
Plan Fractions Treated to Date: 5
Plan Prescribed Dose Per Fraction: 1.8 Gy
Plan Prescribed Dose Per Fraction: 1.8 Gy
Plan Total Fractions Prescribed: 14
Plan Total Fractions Prescribed: 27
Plan Total Prescribed Dose: 25.2 Gy
Plan Total Prescribed Dose: 48.6 Gy
Reference Point Dosage Given to Date: 10.8 Gy
Reference Point Dosage Given to Date: 10.8 Gy
Reference Point Session Dosage Given: 1.8 Gy
Reference Point Session Dosage Given: 1.8 Gy
Session Number: 6

## 2021-12-11 ENCOUNTER — Ambulatory Visit
Admission: RE | Admit: 2021-12-11 | Discharge: 2021-12-11 | Disposition: A | Discharge status: Home / Self Care | Payer: Commercial Managed Care - PPO | Source: Ambulatory Visit | Attending: Radiation Oncology | Admitting: Radiation Oncology

## 2021-12-11 ENCOUNTER — Other Ambulatory Visit: Payer: Self-pay

## 2021-12-11 DIAGNOSIS — C50411 Malignant neoplasm of upper-outer quadrant of right female breast: Secondary | ICD-10-CM | POA: Diagnosis not present

## 2021-12-11 LAB — RAD ONC ARIA SESSION SUMMARY
Course Elapsed Days: 8
Plan Fractions Treated to Date: 4
Plan Fractions Treated to Date: 6
Plan Prescribed Dose Per Fraction: 1.8 Gy
Plan Prescribed Dose Per Fraction: 1.8 Gy
Plan Total Fractions Prescribed: 14
Plan Total Fractions Prescribed: 27
Plan Total Prescribed Dose: 25.2 Gy
Plan Total Prescribed Dose: 48.6 Gy
Reference Point Dosage Given to Date: 12.6 Gy
Reference Point Dosage Given to Date: 12.6 Gy
Reference Point Session Dosage Given: 1.8 Gy
Reference Point Session Dosage Given: 1.8 Gy
Session Number: 7

## 2021-12-14 ENCOUNTER — Ambulatory Visit
Admission: RE | Admit: 2021-12-14 | Discharge: 2021-12-14 | Disposition: A | Discharge status: Home / Self Care | Payer: Commercial Managed Care - PPO | Source: Ambulatory Visit | Attending: Radiation Oncology | Admitting: Radiation Oncology

## 2021-12-14 ENCOUNTER — Other Ambulatory Visit: Payer: Self-pay

## 2021-12-14 ENCOUNTER — Ambulatory Visit: Payer: Commercial Managed Care - PPO

## 2021-12-14 DIAGNOSIS — C50411 Malignant neoplasm of upper-outer quadrant of right female breast: Secondary | ICD-10-CM | POA: Diagnosis not present

## 2021-12-14 LAB — RAD ONC ARIA SESSION SUMMARY
Course Elapsed Days: 11
Plan Fractions Treated to Date: 4
Plan Fractions Treated to Date: 7
Plan Prescribed Dose Per Fraction: 1.8 Gy
Plan Prescribed Dose Per Fraction: 1.8 Gy
Plan Total Fractions Prescribed: 14
Plan Total Fractions Prescribed: 27
Plan Total Prescribed Dose: 25.2 Gy
Plan Total Prescribed Dose: 48.6 Gy
Reference Point Dosage Given to Date: 14.4 Gy
Reference Point Dosage Given to Date: 14.4 Gy
Reference Point Session Dosage Given: 1.8 Gy
Reference Point Session Dosage Given: 1.8 Gy
Session Number: 8

## 2021-12-15 ENCOUNTER — Other Ambulatory Visit: Payer: Self-pay

## 2021-12-15 ENCOUNTER — Ambulatory Visit
Admission: RE | Admit: 2021-12-15 | Discharge: 2021-12-15 | Disposition: A | Discharge status: Home / Self Care | Payer: Commercial Managed Care - PPO | Source: Ambulatory Visit | Attending: Radiation Oncology | Admitting: Radiation Oncology

## 2021-12-15 DIAGNOSIS — C50411 Malignant neoplasm of upper-outer quadrant of right female breast: Secondary | ICD-10-CM | POA: Diagnosis not present

## 2021-12-15 LAB — RAD ONC ARIA SESSION SUMMARY
Course Elapsed Days: 12
Plan Fractions Treated to Date: 5
Plan Fractions Treated to Date: 8
Plan Prescribed Dose Per Fraction: 1.8 Gy
Plan Prescribed Dose Per Fraction: 1.8 Gy
Plan Total Fractions Prescribed: 14
Plan Total Fractions Prescribed: 27
Plan Total Prescribed Dose: 25.2 Gy
Plan Total Prescribed Dose: 48.6 Gy
Reference Point Dosage Given to Date: 16.2 Gy
Reference Point Dosage Given to Date: 16.2 Gy
Reference Point Session Dosage Given: 1.8 Gy
Reference Point Session Dosage Given: 1.8 Gy
Session Number: 9

## 2021-12-16 ENCOUNTER — Other Ambulatory Visit: Payer: Self-pay

## 2021-12-16 ENCOUNTER — Ambulatory Visit
Admission: RE | Admit: 2021-12-16 | Discharge: 2021-12-16 | Disposition: A | Discharge status: Home / Self Care | Payer: Commercial Managed Care - PPO | Source: Ambulatory Visit | Attending: Radiation Oncology | Admitting: Radiation Oncology

## 2021-12-16 DIAGNOSIS — Z171 Estrogen receptor negative status [ER-]: Secondary | ICD-10-CM

## 2021-12-16 DIAGNOSIS — C50411 Malignant neoplasm of upper-outer quadrant of right female breast: Secondary | ICD-10-CM | POA: Diagnosis not present

## 2021-12-16 LAB — RAD ONC ARIA SESSION SUMMARY
Course Elapsed Days: 13
Plan Fractions Treated to Date: 5
Plan Fractions Treated to Date: 9
Plan Prescribed Dose Per Fraction: 1.8 Gy
Plan Prescribed Dose Per Fraction: 1.8 Gy
Plan Total Fractions Prescribed: 14
Plan Total Fractions Prescribed: 27
Plan Total Prescribed Dose: 25.2 Gy
Plan Total Prescribed Dose: 48.6 Gy
Reference Point Dosage Given to Date: 18 Gy
Reference Point Dosage Given to Date: 18 Gy
Reference Point Session Dosage Given: 1.8 Gy
Reference Point Session Dosage Given: 1.8 Gy
Session Number: 10

## 2021-12-16 NOTE — Progress Notes (Signed)
Patient Care Team: Group, [place] as PCP - Philomena Doheny, Paulette Blanch, RN as Oncology Nurse Navigator Rockwell Germany, RN as Oncology Nurse Navigator Nicholas Lose, MD as Consulting Physician (Hematology and Oncology) Coralie Keens, MD as Consulting Physician (General Surgery) Eppie Gibson, MD as Attending Physician (Radiation Oncology)  DIAGNOSIS: No diagnosis found.  SUMMARY OF ONCOLOGIC HISTORY: Oncology History  Malignant neoplasm of upper-outer quadrant of right breast in female, estrogen receptor negative ([place])  04/08/2021 Initial Diagnosis   History of right breast eczematous change to the nipple, patient had palpable lump in the right breast her mammogram 2.1 cm spiculated mass with extensive calcifications that span 11 cm.  Ultrasound revealed 3 cm mass at 10:00 plus small satellite lesions.  Biopsy revealed grade 3 IDC ER 0%, PR 0%, HER2 3+ positive, Ki-67 30%   04/08/2021 Cancer Staging   Staging form: Breast, AJCC 8th Edition - Clinical stage from 04/08/2021: Stage IIB (cT2, cN1, cM0, G3, ER-, PR-, HER2+) - Signed by Nicholas Lose, MD on 04/15/2021 Stage prefix: Initial diagnosis Method of lymph node assessment: Axillary lymph node dissection Histologic grading system: 3 grade system   05/01/2021 - 11/26/2021 Chemotherapy   Patient is on Treatment Plan : BREAST  Docetaxel + Carboplatin + Trastuzumab + Pertuzumab  (TCHP) q21d      05/21/2021 Genetic Testing   Negative hereditary cancer genetic testing: no pathogenic variants detected in [facility] CustomNext-Cancer +RNAinsight Panel.  Report date is 05/21/2021.   The CustomNext-Cancer+RNAinsight panel offered by [facility] includes sequencing and rearrangement analysis for the following 47 genes:  APC, ATM, AXIN2, BARD1, BMPR1A, BRCA1, BRCA2, BRIP1, CDH1, CDK4, CDKN2A, CHEK2, DICER1, EPCAM, GREM1, HOXB13, MEN1, MLH1, MSH2, MSH3, MSH6, MUTYH, NBN, NF1, NF2, NTHL1, PALB2, PMS2, POLD1, POLE, PTEN, RAD51C, RAD51D, RECQL,  RET, SDHA, SDHAF2, SDHB, SDHC, SDHD, SMAD4, SMARCA4, STK11, TP53, TSC1, TSC2, and VHL.  RNA data is routinely analyzed for use in variant interpretation for all genes.   09/04/2021 -  Chemotherapy   Patient is on Treatment Plan : BREAST Trastuzumab  + Pertuzumab q21d x 13 cycles       CHIEF COMPLIANT:   INTERVAL HISTORY: Tara White is a   ALLERGIES:  has No Known Allergies.  MEDICATIONS:  Current Outpatient Medications  Medication Sig Dispense Refill   acetaminophen (TYLENOL) 500 MG tablet Take 2 tablets by mouth every 6 (six) hours as needed.     Homeopathic Products (FRANKINCENSE UPLIFTING) OIL Apply 2 drops topically.     Lactobacillus Rhamnosus, GG, (CULTURELLE) CAPS Take 1 capsule by mouth daily.     rivaroxaban (XARELTO) 20 MG TABS tablet Take 1 tablet (20 mg total) by mouth daily with supper. 30 tablet 6   No current facility-administered medications for this visit.    PHYSICAL EXAMINATION: ECOG PERFORMANCE STATUS: {CHL ONC ECOG PS:[id number]}  There were no vitals filed for this visit. There were no vitals filed for this visit.  BREAST:*** No palpable masses or nodules in either right or left breasts. No palpable axillary supraclavicular or infraclavicular adenopathy no breast tenderness or nipple discharge. (exam performed in the presence of a chaperone)  LABORATORY DATA:  I have reviewed the data as listed    Latest Ref Rng & Units 10/29/2021   12:19 PM 10/02/2021   11:46 AM 09/04/2021   10:55 AM  CMP  Glucose 70 - 99 mg/dL 93  82  106   BUN 6 - 20 mg/dL 23  21  16    Creatinine 0.44 - 1.00  mg/dL 1.07  0.90  0.88   Sodium 135 - 145 mmol/L 139  139  141   Potassium 3.5 - 5.1 mmol/L 4.0  3.9  3.8   Chloride 98 - 111 mmol/L 106  107  109   CO2 22 - 32 mmol/L 27  26  27    Calcium 8.9 - 10.3 mg/dL 9.1  9.4  9.4   Total Protein 6.5 - 8.1 g/dL 7.3  7.2  6.5   Total Bilirubin 0.3 - 1.2 mg/dL 0.4  0.5  0.3   Alkaline Phos 38 - 126 U/L 65  70  62   AST 15 - 41 U/L  20  17  23    ALT 0 - 44 U/L 23  14  31      Lab Results  Component Value Date   WBC 5.2 10/29/2021   HGB 12.1 10/29/2021   HCT 34.9 (L) 10/29/2021   MCV 91.6 10/29/2021   PLT 200 10/29/2021   NEUTROABS 2.3 10/29/2021    ASSESSMENT & PLAN:  No problem-specific Assessment & Plan notes found for this encounter.    No orders of the defined types were placed in this encounter.  The patient has a good understanding of the overall plan. she agrees with it. she will call with any problems that may develop before the next visit here. Total time spent: 30 mins including face to face time and time spent for planning, charting and co-ordination of care   Suzzette Righter, [place] 12/16/21    I Gardiner Coins am scribing for Dr. Lindi Adie  ***

## 2021-12-17 ENCOUNTER — Inpatient Hospital Stay (HOSPITAL_BASED_OUTPATIENT_CLINIC_OR_DEPARTMENT_OTHER): Payer: Commercial Managed Care - PPO | Admitting: Hematology and Oncology

## 2021-12-17 ENCOUNTER — Telehealth: Payer: Self-pay | Admitting: *Deleted

## 2021-12-17 ENCOUNTER — Ambulatory Visit
Admission: RE | Admit: 2021-12-17 | Discharge: 2021-12-17 | Disposition: A | Discharge status: Home / Self Care | Payer: Commercial Managed Care - PPO | Source: Ambulatory Visit | Attending: Radiation Oncology | Admitting: Radiation Oncology

## 2021-12-17 ENCOUNTER — Ambulatory Visit (HOSPITAL_COMMUNITY): Admission: RE | Admit: 2021-12-17 | Payer: Commercial Managed Care - PPO | Source: Ambulatory Visit

## 2021-12-17 ENCOUNTER — Inpatient Hospital Stay: Payer: Commercial Managed Care - PPO

## 2021-12-17 ENCOUNTER — Other Ambulatory Visit: Payer: Self-pay

## 2021-12-17 VITALS — BP 116/70 | HR 70 | Resp 16

## 2021-12-17 DIAGNOSIS — Z79899 Other long term (current) drug therapy: Secondary | ICD-10-CM | POA: Insufficient documentation

## 2021-12-17 DIAGNOSIS — Z171 Estrogen receptor negative status [ER-]: Secondary | ICD-10-CM | POA: Insufficient documentation

## 2021-12-17 DIAGNOSIS — Z9011 Acquired absence of right breast and nipple: Secondary | ICD-10-CM | POA: Insufficient documentation

## 2021-12-17 DIAGNOSIS — C50411 Malignant neoplasm of upper-outer quadrant of right female breast: Secondary | ICD-10-CM

## 2021-12-17 DIAGNOSIS — Z5111 Encounter for antineoplastic chemotherapy: Secondary | ICD-10-CM | POA: Insufficient documentation

## 2021-12-17 LAB — CBC WITH DIFFERENTIAL (CANCER CENTER ONLY)
Abs Immature Granulocytes: 0.02 10*3/uL (ref 0.00–0.07)
Basophils Absolute: 0 10*3/uL (ref 0.0–0.1)
Basophils Relative: 0 %
Eosinophils Absolute: 0.1 10*3/uL (ref 0.0–0.5)
Eosinophils Relative: 1 %
HCT: 37.9 % (ref 36.0–46.0)
Hemoglobin: 13.2 g/dL (ref 12.0–15.0)
Immature Granulocytes: 0 %
Lymphocytes Relative: 34 %
Lymphs Abs: 1.6 10*3/uL (ref 0.7–4.0)
MCH: 30.6 pg (ref 26.0–34.0)
MCHC: 34.8 g/dL (ref 30.0–36.0)
MCV: 87.9 fL (ref 80.0–100.0)
Monocytes Absolute: 0.3 10*3/uL (ref 0.1–1.0)
Monocytes Relative: 6 %
Neutro Abs: 2.7 10*3/uL (ref 1.7–7.7)
Neutrophils Relative %: 59 %
Platelet Count: 188 10*3/uL (ref 150–400)
RBC: 4.31 MIL/uL (ref 3.87–5.11)
RDW: 12.6 % (ref 11.5–15.5)
WBC Count: 4.7 10*3/uL (ref 4.0–10.5)
nRBC: 0 % (ref 0.0–0.2)

## 2021-12-17 LAB — CMP (CANCER CENTER ONLY)
ALT: 16 U/L (ref 0–44)
AST: 18 U/L (ref 15–41)
Albumin: 4.4 g/dL (ref 3.5–5.0)
Alkaline Phosphatase: 82 U/L (ref 38–126)
Anion gap: 6 (ref 5–15)
BUN: 21 mg/dL — ABNORMAL HIGH (ref 6–20)
CO2: 28 mmol/L (ref 22–32)
Calcium: 9.4 mg/dL (ref 8.9–10.3)
Chloride: 106 mmol/L (ref 98–111)
Creatinine: 1.01 mg/dL — ABNORMAL HIGH (ref 0.44–1.00)
GFR, Estimated: >60 mL/min (ref >60)
Glucose, Bld: 79 mg/dL (ref 70–99)
Potassium: 3.9 mmol/L (ref 3.5–5.1)
Sodium: 140 mmol/L (ref 135–145)
Total Bilirubin: 0.7 mg/dL (ref 0.3–1.2)
Total Protein: 7.2 g/dL (ref 6.5–8.1)

## 2021-12-17 LAB — RAD ONC ARIA SESSION SUMMARY
Course Elapsed Days: 14
Plan Fractions Treated to Date: 10
Plan Fractions Treated to Date: 6
Plan Prescribed Dose Per Fraction: 1.8 Gy
Plan Prescribed Dose Per Fraction: 1.8 Gy
Plan Total Fractions Prescribed: 14
Plan Total Fractions Prescribed: 27
Plan Total Prescribed Dose: 25.2 Gy
Plan Total Prescribed Dose: 48.6 Gy
Reference Point Dosage Given to Date: 19.8 Gy
Reference Point Dosage Given to Date: 19.8 Gy
Reference Point Session Dosage Given: 1.8 Gy
Reference Point Session Dosage Given: 1.8 Gy
Session Number: 11

## 2021-12-17 MED ORDER — SODIUM CHLORIDE 0.9 % IV SOLN: Freq: Once | INTRAVENOUS | Status: AC

## 2021-12-17 MED ORDER — ACETAMINOPHEN 325 MG PO TABS
650.0000 mg | ORAL_TABLET | Freq: Once | ORAL | Status: AC
  Administered 2021-12-17: 650 mg via ORAL
  Filled 2021-12-17: qty 2

## 2021-12-17 MED ORDER — SODIUM CHLORIDE 0.9 % IV SOLN
420.0000 mg | Freq: Once | INTRAVENOUS | Status: AC
  Administered 2021-12-17: 420 mg via INTRAVENOUS
  Filled 2021-12-17: qty 14

## 2021-12-17 MED ORDER — DIPHENHYDRAMINE HCL 25 MG PO CAPS
50.0000 mg | ORAL_CAPSULE | Freq: Once | ORAL | Status: AC
  Administered 2021-12-17: 50 mg via ORAL
  Filled 2021-12-17: qty 2

## 2021-12-17 MED ORDER — TRASTUZUMAB-ANNS CHEMO 150 MG IV SOLR
6.0000 mg/kg | Freq: Once | INTRAVENOUS | Status: AC
  Administered 2021-12-17: 420 mg via INTRAVENOUS
  Filled 2021-12-17: qty 20

## 2021-12-17 MED ORDER — HEPARIN SOD (PORK) LOCK FLUSH 100 UNIT/ML IV SOLN
500.0000 [IU] | Freq: Once | INTRAVENOUS | Status: AC | PRN
  Administered 2021-12-17: 500 [IU]

## 2021-12-17 MED ORDER — SODIUM CHLORIDE 0.9% FLUSH
10.0000 mL | INTRAVENOUS | Status: DC | PRN
  Administered 2021-12-17: 10 mL

## 2021-12-17 NOTE — Telephone Encounter (Signed)
Per Dr. Geralyn Flash direction - contacted [place] scheduling and cancelled patient's appt this afternoon for upper  extremity venous ultrasound. Dorothy in CV scheduling confirmed cancellation

## 2021-12-17 NOTE — Patient Instructions (Signed)
[facility] MEDICAL ONCOLOGY  Discharge Instructions: Thank you for choosing [facility] to provide your oncology and hematology care.   If you have a lab appointment with the Cancer Center, please go directly to the Cancer Center and check in at the registration area.   Wear comfortable clothing and clothing appropriate for easy access to any Portacath or PICC line.   We strive to give you quality time with your provider. You may need to reschedule your appointment if you arrive late (15 or more minutes).  Arriving late affects you and other patients whose appointments are after yours.  Also, if you miss three or more appointments without notifying the office, you may be dismissed from the clinic at the provider's discretion.      For prescription refill requests, have your pharmacy contact our office and allow 72 hours for refills to be completed.    Today you received the following chemotherapy and/or immunotherapy agents: trastuzumab and pertuzumab      To help prevent nausea and vomiting after your treatment, we encourage you to take your nausea medication as directed.  BELOW ARE SYMPTOMS THAT SHOULD BE REPORTED IMMEDIATELY: *FEVER GREATER THAN 100.4 F (38 C) OR HIGHER *CHILLS OR SWEATING *NAUSEA AND VOMITING THAT IS NOT CONTROLLED WITH YOUR NAUSEA MEDICATION *UNUSUAL SHORTNESS OF BREATH *UNUSUAL BRUISING OR BLEEDING *URINARY PROBLEMS (pain or burning when urinating, or frequent urination) *BOWEL PROBLEMS (unusual diarrhea, constipation, pain near the anus) TENDERNESS IN MOUTH AND THROAT WITH OR WITHOUT PRESENCE OF ULCERS (sore throat, sores in mouth, or a toothache) UNUSUAL RASH, SWELLING OR PAIN  UNUSUAL VAGINAL DISCHARGE OR ITCHING   Items with * indicate a potential emergency and should be followed up as soon as possible or go to the Emergency Department if any problems should occur.  Please show the CHEMOTHERAPY ALERT CARD or IMMUNOTHERAPY ALERT  CARD at check-in to the Emergency Department and triage nurse.  Should you have questions after your visit or need to cancel or reschedule your appointment, please contact [facility]ICAL [facility]  Dept: [phone]  and follow the prompts.  Office hours are 8:00 a.m. to 4:30 p.m. Monday - Friday. Please note that voicemails left after 4:00 p.m. may not be returned until the following business day.  We are closed weekends and major holidays. You have access to a nurse at all times for urgent questions. Please call the main number to the clinic Dept: [phone] and follow the prompts.   For any non-urgent questions, you may also contact your provider using MyChart. We now offer e-Visits for anyone 18 and older to request care online for non-urgent symptoms. For details visit [email or web address].   Also download the MyChart app! Go to the app store, search "MyChart", open the app, select [facility], and log in with your MyChart username and password.  Masks are optional in the cancer centers. If you would like for your care team to wear a mask while they are taking care of you, please let them know. You may have one support person who is at least 32 years old accompany you for your appointments. 

## 2021-12-17 NOTE — Progress Notes (Signed)
Per Dr Lindi Adie, ok to treat today using ECHO from 08/20/2021.

## 2021-12-17 NOTE — Assessment & Plan Note (Addendum)
04/08/2021:History of right breast eczematous change to the nipple, patient had palpable lump in the right breast her mammogram 2.1 cm spiculated mass with extensive calcifications that span 11 cm. Ultrasound revealed 3 cm mass at 10:00 plus small satellite lesions. Biopsy revealed grade 3 IDC ER 0%, PR 0%, HER2 3+ positive, Ki-67 30%  Recommendation: 1. Neoadjuvant chemotherapy with [facility] Perjeta 6 cyclescompleted 5/17/2023followed by Herceptin Perjeta maintenance for 1 year 2. right mastectomy with reconstruction 10/13/2021: No residual malignancy complete pathologic response 0/5 lymph nodes negative 3. Followed by adjuvant radiation therapyto complete 01/11/2022 ----------------------------------------------------------------------------------------------------------------------------------------------- Treatment plan: Herceptin and Perjeta maintenance Toxicities: 1.  Acneform rash on the face: Could be fixed drug eruption from Herceptin and Perjeta.  She used topical clindamycin.  It resolved her acne. 2. intermittent diarrhea: Not bothering her.  Return to clinic every 3 weeks for Herceptin Perjeta and every 6 weeks for follow-up with me.

## 2021-12-18 ENCOUNTER — Other Ambulatory Visit: Payer: Self-pay

## 2021-12-18 ENCOUNTER — Ambulatory Visit
Admission: RE | Admit: 2021-12-18 | Discharge: 2021-12-18 | Disposition: A | Discharge status: Home / Self Care | Payer: Commercial Managed Care - PPO | Source: Ambulatory Visit | Attending: Radiation Oncology | Admitting: Radiation Oncology

## 2021-12-18 DIAGNOSIS — C50411 Malignant neoplasm of upper-outer quadrant of right female breast: Secondary | ICD-10-CM | POA: Diagnosis not present

## 2021-12-18 LAB — RAD ONC ARIA SESSION SUMMARY
Course Elapsed Days: 15
Plan Fractions Treated to Date: 11
Plan Fractions Treated to Date: 6
Plan Prescribed Dose Per Fraction: 1.8 Gy
Plan Prescribed Dose Per Fraction: 1.8 Gy
Plan Total Fractions Prescribed: 14
Plan Total Fractions Prescribed: 27
Plan Total Prescribed Dose: 25.2 Gy
Plan Total Prescribed Dose: 48.6 Gy
Reference Point Dosage Given to Date: 21.6 Gy
Reference Point Dosage Given to Date: 21.6 Gy
Reference Point Session Dosage Given: 1.8 Gy
Reference Point Session Dosage Given: 1.8 Gy
Session Number: 12

## 2021-12-21 ENCOUNTER — Other Ambulatory Visit: Payer: Self-pay

## 2021-12-21 ENCOUNTER — Ambulatory Visit
Admission: RE | Admit: 2021-12-21 | Discharge: 2021-12-21 | Disposition: A | Discharge status: Home / Self Care | Payer: Commercial Managed Care - PPO | Source: Ambulatory Visit | Attending: Radiation Oncology | Admitting: Radiation Oncology

## 2021-12-21 ENCOUNTER — Ambulatory Visit: Payer: Commercial Managed Care - PPO

## 2021-12-21 DIAGNOSIS — C50411 Malignant neoplasm of upper-outer quadrant of right female breast: Secondary | ICD-10-CM | POA: Diagnosis not present

## 2021-12-21 LAB — RAD ONC ARIA SESSION SUMMARY
Course Elapsed Days: 18
Plan Fractions Treated to Date: 12
Plan Fractions Treated to Date: 7
Plan Prescribed Dose Per Fraction: 1.8 Gy
Plan Prescribed Dose Per Fraction: 1.8 Gy
Plan Total Fractions Prescribed: 14
Plan Total Fractions Prescribed: 27
Plan Total Prescribed Dose: 25.2 Gy
Plan Total Prescribed Dose: 48.6 Gy
Reference Point Dosage Given to Date: 23.4 Gy
Reference Point Dosage Given to Date: 23.4 Gy
Reference Point Session Dosage Given: 1.8 Gy
Reference Point Session Dosage Given: 1.8 Gy
Session Number: 13

## 2021-12-22 ENCOUNTER — Ambulatory Visit
Admission: RE | Admit: 2021-12-22 | Discharge: 2021-12-22 | Disposition: A | Discharge status: Home / Self Care | Payer: Commercial Managed Care - PPO | Source: Ambulatory Visit | Attending: Radiation Oncology | Admitting: Radiation Oncology

## 2021-12-22 ENCOUNTER — Other Ambulatory Visit: Payer: Self-pay

## 2021-12-22 DIAGNOSIS — C50411 Malignant neoplasm of upper-outer quadrant of right female breast: Secondary | ICD-10-CM | POA: Diagnosis not present

## 2021-12-22 LAB — RAD ONC ARIA SESSION SUMMARY
Course Elapsed Days: 19
Plan Fractions Treated to Date: 13
Plan Fractions Treated to Date: 7
Plan Prescribed Dose Per Fraction: 1.8 Gy
Plan Prescribed Dose Per Fraction: 1.8 Gy
Plan Total Fractions Prescribed: 14
Plan Total Fractions Prescribed: 27
Plan Total Prescribed Dose: 25.2 Gy
Plan Total Prescribed Dose: 48.6 Gy
Reference Point Dosage Given to Date: 25.2 Gy
Reference Point Dosage Given to Date: 25.2 Gy
Reference Point Session Dosage Given: 1.8 Gy
Reference Point Session Dosage Given: 1.8 Gy
Session Number: 14

## 2021-12-23 ENCOUNTER — Ambulatory Visit
Admission: RE | Admit: 2021-12-23 | Discharge: 2021-12-23 | Disposition: A | Discharge status: Home / Self Care | Payer: Commercial Managed Care - PPO | Source: Ambulatory Visit | Attending: Radiation Oncology | Admitting: Radiation Oncology

## 2021-12-23 ENCOUNTER — Other Ambulatory Visit: Payer: Self-pay

## 2021-12-23 DIAGNOSIS — C50411 Malignant neoplasm of upper-outer quadrant of right female breast: Secondary | ICD-10-CM | POA: Diagnosis not present

## 2021-12-23 LAB — RAD ONC ARIA SESSION SUMMARY
Course Elapsed Days: 20
Plan Fractions Treated to Date: 14
Plan Fractions Treated to Date: 8
Plan Prescribed Dose Per Fraction: 1.8 Gy
Plan Prescribed Dose Per Fraction: 1.8 Gy
Plan Total Fractions Prescribed: 14
Plan Total Fractions Prescribed: 27
Plan Total Prescribed Dose: 25.2 Gy
Plan Total Prescribed Dose: 48.6 Gy
Reference Point Dosage Given to Date: 27 Gy
Reference Point Dosage Given to Date: 27 Gy
Reference Point Session Dosage Given: 1.8 Gy
Reference Point Session Dosage Given: 1.8 Gy
Session Number: 15

## 2021-12-24 ENCOUNTER — Ambulatory Visit
Admission: RE | Admit: 2021-12-24 | Discharge: 2021-12-24 | Disposition: A | Discharge status: Home / Self Care | Payer: Commercial Managed Care - PPO | Source: Ambulatory Visit | Attending: Radiation Oncology | Admitting: Radiation Oncology

## 2021-12-24 ENCOUNTER — Other Ambulatory Visit: Payer: Self-pay

## 2021-12-24 DIAGNOSIS — C50411 Malignant neoplasm of upper-outer quadrant of right female breast: Secondary | ICD-10-CM | POA: Diagnosis not present

## 2021-12-24 LAB — RAD ONC ARIA SESSION SUMMARY
Course Elapsed Days: 21
Plan Fractions Treated to Date: 15
Plan Fractions Treated to Date: 8
Plan Prescribed Dose Per Fraction: 1.8 Gy
Plan Prescribed Dose Per Fraction: 1.8 Gy
Plan Total Fractions Prescribed: 14
Plan Total Fractions Prescribed: 27
Plan Total Prescribed Dose: 25.2 Gy
Plan Total Prescribed Dose: 48.6 Gy
Reference Point Dosage Given to Date: 28.8 Gy
Reference Point Dosage Given to Date: 28.8 Gy
Reference Point Session Dosage Given: 1.8 Gy
Reference Point Session Dosage Given: 1.8 Gy
Session Number: 16

## 2021-12-25 ENCOUNTER — Ambulatory Visit
Admission: RE | Admit: 2021-12-25 | Discharge: 2021-12-25 | Disposition: A | Discharge status: Home / Self Care | Payer: Commercial Managed Care - PPO | Source: Ambulatory Visit | Attending: Radiation Oncology | Admitting: Radiation Oncology

## 2021-12-25 ENCOUNTER — Other Ambulatory Visit: Payer: Self-pay

## 2021-12-25 DIAGNOSIS — C50411 Malignant neoplasm of upper-outer quadrant of right female breast: Secondary | ICD-10-CM | POA: Diagnosis not present

## 2021-12-25 LAB — RAD ONC ARIA SESSION SUMMARY
Course Elapsed Days: 22
Plan Fractions Treated to Date: 16
Plan Fractions Treated to Date: 9
Plan Prescribed Dose Per Fraction: 1.8 Gy
Plan Prescribed Dose Per Fraction: 1.8 Gy
Plan Total Fractions Prescribed: 14
Plan Total Fractions Prescribed: 27
Plan Total Prescribed Dose: 25.2 Gy
Plan Total Prescribed Dose: 48.6 Gy
Reference Point Dosage Given to Date: 30.6 Gy
Reference Point Dosage Given to Date: 30.6 Gy
Reference Point Session Dosage Given: 1.8 Gy
Reference Point Session Dosage Given: 1.8 Gy
Session Number: 17

## 2021-12-28 ENCOUNTER — Ambulatory Visit: Payer: Commercial Managed Care - PPO

## 2021-12-28 ENCOUNTER — Other Ambulatory Visit: Payer: Self-pay

## 2021-12-28 ENCOUNTER — Ambulatory Visit
Admission: RE | Admit: 2021-12-28 | Discharge: 2021-12-28 | Disposition: A | Discharge status: Home / Self Care | Payer: Commercial Managed Care - PPO | Source: Ambulatory Visit | Attending: Radiation Oncology | Admitting: Radiation Oncology

## 2021-12-28 DIAGNOSIS — Z5111 Encounter for antineoplastic chemotherapy: Secondary | ICD-10-CM | POA: Diagnosis present

## 2021-12-28 DIAGNOSIS — Z171 Estrogen receptor negative status [ER-]: Secondary | ICD-10-CM | POA: Insufficient documentation

## 2021-12-28 DIAGNOSIS — R300 Dysuria: Secondary | ICD-10-CM | POA: Diagnosis not present

## 2021-12-28 DIAGNOSIS — C50411 Malignant neoplasm of upper-outer quadrant of right female breast: Secondary | ICD-10-CM | POA: Insufficient documentation

## 2021-12-28 DIAGNOSIS — Z79899 Other long term (current) drug therapy: Secondary | ICD-10-CM | POA: Diagnosis not present

## 2021-12-28 LAB — RAD ONC ARIA SESSION SUMMARY
Course Elapsed Days: 25
Plan Fractions Treated to Date: 17
Plan Fractions Treated to Date: 9
Plan Prescribed Dose Per Fraction: 1.8 Gy
Plan Prescribed Dose Per Fraction: 1.8 Gy
Plan Total Fractions Prescribed: 14
Plan Total Fractions Prescribed: 27
Plan Total Prescribed Dose: 25.2 Gy
Plan Total Prescribed Dose: 48.6 Gy
Reference Point Dosage Given to Date: 32.4 Gy
Reference Point Dosage Given to Date: 32.4 Gy
Reference Point Session Dosage Given: 1.8 Gy
Reference Point Session Dosage Given: 1.8 Gy
Session Number: 18

## 2021-12-28 MED ORDER — RADIAPLEXRX EX GEL: Freq: Once | CUTANEOUS | Status: AC

## 2021-12-29 ENCOUNTER — Ambulatory Visit
Admission: RE | Admit: 2021-12-29 | Discharge: 2021-12-29 | Disposition: A | Discharge status: Home / Self Care | Payer: Commercial Managed Care - PPO | Source: Ambulatory Visit | Attending: Radiation Oncology | Admitting: Radiation Oncology

## 2021-12-29 ENCOUNTER — Other Ambulatory Visit: Payer: Self-pay

## 2021-12-29 DIAGNOSIS — C50411 Malignant neoplasm of upper-outer quadrant of right female breast: Secondary | ICD-10-CM | POA: Diagnosis not present

## 2021-12-29 LAB — RAD ONC ARIA SESSION SUMMARY
Course Elapsed Days: 26
Plan Fractions Treated to Date: 10
Plan Fractions Treated to Date: 18
Plan Prescribed Dose Per Fraction: 1.8 Gy
Plan Prescribed Dose Per Fraction: 1.8 Gy
Plan Total Fractions Prescribed: 14
Plan Total Fractions Prescribed: 27
Plan Total Prescribed Dose: 25.2 Gy
Plan Total Prescribed Dose: 48.6 Gy
Reference Point Dosage Given to Date: 34.2 Gy
Reference Point Dosage Given to Date: 34.2 Gy
Reference Point Session Dosage Given: 1.8 Gy
Reference Point Session Dosage Given: 1.8 Gy
Session Number: 19

## 2021-12-30 ENCOUNTER — Other Ambulatory Visit: Payer: Self-pay

## 2021-12-30 ENCOUNTER — Ambulatory Visit
Admission: RE | Admit: 2021-12-30 | Discharge: 2021-12-30 | Disposition: A | Discharge status: Home / Self Care | Payer: Commercial Managed Care - PPO | Source: Ambulatory Visit | Attending: Radiation Oncology | Admitting: Radiation Oncology

## 2021-12-30 DIAGNOSIS — C50411 Malignant neoplasm of upper-outer quadrant of right female breast: Secondary | ICD-10-CM | POA: Diagnosis not present

## 2021-12-30 LAB — RAD ONC ARIA SESSION SUMMARY
Course Elapsed Days: 27
Plan Fractions Treated to Date: 10
Plan Fractions Treated to Date: 19
Plan Prescribed Dose Per Fraction: 1.8 Gy
Plan Prescribed Dose Per Fraction: 1.8 Gy
Plan Total Fractions Prescribed: 14
Plan Total Fractions Prescribed: 27
Plan Total Prescribed Dose: 25.2 Gy
Plan Total Prescribed Dose: 48.6 Gy
Reference Point Dosage Given to Date: 36 Gy
Reference Point Dosage Given to Date: 36 Gy
Reference Point Session Dosage Given: 1.8 Gy
Reference Point Session Dosage Given: 1.8 Gy
Session Number: 20

## 2021-12-31 ENCOUNTER — Other Ambulatory Visit: Payer: Self-pay

## 2021-12-31 ENCOUNTER — Ambulatory Visit
Admission: RE | Admit: 2021-12-31 | Discharge: 2021-12-31 | Disposition: A | Discharge status: Home / Self Care | Payer: Commercial Managed Care - PPO | Source: Ambulatory Visit | Attending: Radiation Oncology | Admitting: Radiation Oncology

## 2021-12-31 DIAGNOSIS — C50411 Malignant neoplasm of upper-outer quadrant of right female breast: Secondary | ICD-10-CM | POA: Diagnosis not present

## 2021-12-31 LAB — RAD ONC ARIA SESSION SUMMARY
Course Elapsed Days: 28
Plan Fractions Treated to Date: 11
Plan Fractions Treated to Date: 20
Plan Prescribed Dose Per Fraction: 1.8 Gy
Plan Prescribed Dose Per Fraction: 1.8 Gy
Plan Total Fractions Prescribed: 14
Plan Total Fractions Prescribed: 27
Plan Total Prescribed Dose: 25.2 Gy
Plan Total Prescribed Dose: 48.6 Gy
Reference Point Dosage Given to Date: 37.8 Gy
Reference Point Dosage Given to Date: 37.8 Gy
Reference Point Session Dosage Given: 1.8 Gy
Reference Point Session Dosage Given: 1.8 Gy
Session Number: 21

## 2022-01-01 ENCOUNTER — Other Ambulatory Visit: Payer: Self-pay

## 2022-01-01 ENCOUNTER — Ambulatory Visit
Admission: RE | Admit: 2022-01-01 | Discharge: 2022-01-01 | Disposition: A | Discharge status: Home / Self Care | Payer: Commercial Managed Care - PPO | Source: Ambulatory Visit | Attending: Radiation Oncology | Admitting: Radiation Oncology

## 2022-01-01 DIAGNOSIS — C50411 Malignant neoplasm of upper-outer quadrant of right female breast: Secondary | ICD-10-CM | POA: Diagnosis not present

## 2022-01-01 LAB — RAD ONC ARIA SESSION SUMMARY
Course Elapsed Days: 29
Plan Fractions Treated to Date: 11
Plan Fractions Treated to Date: 21
Plan Prescribed Dose Per Fraction: 1.8 Gy
Plan Prescribed Dose Per Fraction: 1.8 Gy
Plan Total Fractions Prescribed: 14
Plan Total Fractions Prescribed: 27
Plan Total Prescribed Dose: 25.2 Gy
Plan Total Prescribed Dose: 48.6 Gy
Reference Point Dosage Given to Date: 39.6 Gy
Reference Point Dosage Given to Date: 39.6 Gy
Reference Point Session Dosage Given: 1.8 Gy
Reference Point Session Dosage Given: 1.8 Gy
Session Number: 22

## 2022-01-04 ENCOUNTER — Other Ambulatory Visit: Payer: Self-pay

## 2022-01-04 ENCOUNTER — Ambulatory Visit
Admission: RE | Admit: 2022-01-04 | Discharge: 2022-01-04 | Disposition: A | Discharge status: Home / Self Care | Payer: Commercial Managed Care - PPO | Source: Ambulatory Visit | Attending: Radiation Oncology | Admitting: Radiation Oncology

## 2022-01-04 ENCOUNTER — Ambulatory Visit: Payer: Commercial Managed Care - PPO

## 2022-01-04 DIAGNOSIS — C50411 Malignant neoplasm of upper-outer quadrant of right female breast: Secondary | ICD-10-CM | POA: Diagnosis not present

## 2022-01-04 LAB — RAD ONC ARIA SESSION SUMMARY
Course Elapsed Days: 32
Plan Fractions Treated to Date: 12
Plan Fractions Treated to Date: 22
Plan Prescribed Dose Per Fraction: 1.8 Gy
Plan Prescribed Dose Per Fraction: 1.8 Gy
Plan Total Fractions Prescribed: 14
Plan Total Fractions Prescribed: 27
Plan Total Prescribed Dose: 25.2 Gy
Plan Total Prescribed Dose: 48.6 Gy
Reference Point Dosage Given to Date: 41.4 Gy
Reference Point Dosage Given to Date: 41.4 Gy
Reference Point Session Dosage Given: 1.8 Gy
Reference Point Session Dosage Given: 1.8 Gy
Session Number: 23

## 2022-01-05 ENCOUNTER — Other Ambulatory Visit: Payer: Self-pay

## 2022-01-05 ENCOUNTER — Ambulatory Visit
Admission: RE | Admit: 2022-01-05 | Discharge: 2022-01-05 | Disposition: A | Discharge status: Home / Self Care | Payer: Commercial Managed Care - PPO | Source: Ambulatory Visit | Attending: Radiation Oncology | Admitting: Radiation Oncology

## 2022-01-05 DIAGNOSIS — C50411 Malignant neoplasm of upper-outer quadrant of right female breast: Secondary | ICD-10-CM | POA: Diagnosis not present

## 2022-01-05 LAB — RAD ONC ARIA SESSION SUMMARY
Course Elapsed Days: 33
Plan Fractions Treated to Date: 12
Plan Fractions Treated to Date: 23
Plan Prescribed Dose Per Fraction: 1.8 Gy
Plan Prescribed Dose Per Fraction: 1.8 Gy
Plan Total Fractions Prescribed: 14
Plan Total Fractions Prescribed: 27
Plan Total Prescribed Dose: 25.2 Gy
Plan Total Prescribed Dose: 48.6 Gy
Reference Point Dosage Given to Date: 43.2 Gy
Reference Point Dosage Given to Date: 43.2 Gy
Reference Point Session Dosage Given: 1.8 Gy
Reference Point Session Dosage Given: 1.8 Gy
Session Number: 24

## 2022-01-06 ENCOUNTER — Other Ambulatory Visit: Payer: Self-pay

## 2022-01-06 ENCOUNTER — Ambulatory Visit
Admission: RE | Admit: 2022-01-06 | Discharge: 2022-01-06 | Disposition: A | Discharge status: Home / Self Care | Payer: Commercial Managed Care - PPO | Source: Ambulatory Visit | Attending: Radiation Oncology | Admitting: Radiation Oncology

## 2022-01-06 ENCOUNTER — Inpatient Hospital Stay: Payer: Commercial Managed Care - PPO | Attending: Hematology and Oncology

## 2022-01-06 VITALS — BP 131/76 | HR 82 | Temp 97.8°F | Resp 17 | Wt 159.2 lb

## 2022-01-06 DIAGNOSIS — Z5111 Encounter for antineoplastic chemotherapy: Secondary | ICD-10-CM | POA: Insufficient documentation

## 2022-01-06 DIAGNOSIS — C50411 Malignant neoplasm of upper-outer quadrant of right female breast: Secondary | ICD-10-CM | POA: Diagnosis not present

## 2022-01-06 DIAGNOSIS — Z79899 Other long term (current) drug therapy: Secondary | ICD-10-CM | POA: Insufficient documentation

## 2022-01-06 DIAGNOSIS — Z171 Estrogen receptor negative status [ER-]: Secondary | ICD-10-CM | POA: Insufficient documentation

## 2022-01-06 DIAGNOSIS — R300 Dysuria: Secondary | ICD-10-CM | POA: Insufficient documentation

## 2022-01-06 LAB — RAD ONC ARIA SESSION SUMMARY
Course Elapsed Days: 34
Plan Fractions Treated to Date: 13
Plan Fractions Treated to Date: 24
Plan Prescribed Dose Per Fraction: 1.8 Gy
Plan Prescribed Dose Per Fraction: 1.8 Gy
Plan Total Fractions Prescribed: 14
Plan Total Fractions Prescribed: 27
Plan Total Prescribed Dose: 25.2 Gy
Plan Total Prescribed Dose: 48.6 Gy
Reference Point Dosage Given to Date: 45 Gy
Reference Point Dosage Given to Date: 45 Gy
Reference Point Session Dosage Given: 1.8 Gy
Reference Point Session Dosage Given: 1.8 Gy
Session Number: 25

## 2022-01-06 MED ORDER — HEPARIN SOD (PORK) LOCK FLUSH 100 UNIT/ML IV SOLN
500.0000 [IU] | Freq: Once | INTRAVENOUS | Status: AC | PRN
  Administered 2022-01-06: 500 [IU]

## 2022-01-06 MED ORDER — ACETAMINOPHEN 325 MG PO TABS
650.0000 mg | ORAL_TABLET | Freq: Once | ORAL | Status: AC
  Administered 2022-01-06: 650 mg via ORAL
  Filled 2022-01-06: qty 2

## 2022-01-06 MED ORDER — SODIUM CHLORIDE 0.9 % IV SOLN: Freq: Once | INTRAVENOUS | Status: AC

## 2022-01-06 MED ORDER — DIPHENHYDRAMINE HCL 25 MG PO CAPS
50.0000 mg | ORAL_CAPSULE | Freq: Once | ORAL | Status: AC
  Administered 2022-01-06: 50 mg via ORAL
  Filled 2022-01-06: qty 2

## 2022-01-06 MED ORDER — SODIUM CHLORIDE 0.9 % IV SOLN
420.0000 mg | Freq: Once | INTRAVENOUS | Status: AC
  Administered 2022-01-06: 420 mg via INTRAVENOUS
  Filled 2022-01-06: qty 14

## 2022-01-06 MED ORDER — SODIUM CHLORIDE 0.9% FLUSH
10.0000 mL | INTRAVENOUS | Status: DC | PRN
  Administered 2022-01-06: 10 mL

## 2022-01-06 MED ORDER — TRASTUZUMAB-ANNS CHEMO 150 MG IV SOLR
6.0000 mg/kg | Freq: Once | INTRAVENOUS | Status: AC
  Administered 2022-01-06: 420 mg via INTRAVENOUS
  Filled 2022-01-06: qty 20

## 2022-01-06 NOTE — Progress Notes (Signed)
Per Dr Lindi Adie, ok to proceed today with echo from May 2023- 60-65%

## 2022-01-07 ENCOUNTER — Other Ambulatory Visit: Payer: Self-pay

## 2022-01-07 ENCOUNTER — Inpatient Hospital Stay: Payer: Commercial Managed Care - PPO

## 2022-01-07 ENCOUNTER — Ambulatory Visit
Admission: RE | Admit: 2022-01-07 | Discharge: 2022-01-07 | Disposition: A | Discharge status: Home / Self Care | Payer: Commercial Managed Care - PPO | Source: Ambulatory Visit | Attending: Radiation Oncology | Admitting: Radiation Oncology

## 2022-01-07 DIAGNOSIS — C50411 Malignant neoplasm of upper-outer quadrant of right female breast: Secondary | ICD-10-CM | POA: Diagnosis not present

## 2022-01-07 LAB — RAD ONC ARIA SESSION SUMMARY
Course Elapsed Days: 35
Plan Fractions Treated to Date: 13
Plan Fractions Treated to Date: 25
Plan Prescribed Dose Per Fraction: 1.8 Gy
Plan Prescribed Dose Per Fraction: 1.8 Gy
Plan Total Fractions Prescribed: 14
Plan Total Fractions Prescribed: 27
Plan Total Prescribed Dose: 25.2 Gy
Plan Total Prescribed Dose: 48.6 Gy
Reference Point Dosage Given to Date: 46.8 Gy
Reference Point Dosage Given to Date: 46.8 Gy
Reference Point Session Dosage Given: 1.8 Gy
Reference Point Session Dosage Given: 1.8 Gy
Session Number: 26

## 2022-01-08 ENCOUNTER — Inpatient Hospital Stay (HOSPITAL_BASED_OUTPATIENT_CLINIC_OR_DEPARTMENT_OTHER): Payer: Commercial Managed Care - PPO | Admitting: Physician Assistant

## 2022-01-08 ENCOUNTER — Other Ambulatory Visit: Payer: Self-pay

## 2022-01-08 ENCOUNTER — Other Ambulatory Visit: Payer: Self-pay | Admitting: *Deleted

## 2022-01-08 ENCOUNTER — Inpatient Hospital Stay: Payer: Commercial Managed Care - PPO

## 2022-01-08 ENCOUNTER — Encounter: Payer: Self-pay | Admitting: Hematology and Oncology

## 2022-01-08 ENCOUNTER — Ambulatory Visit
Admission: RE | Admit: 2022-01-08 | Discharge: 2022-01-08 | Disposition: A | Discharge status: Home / Self Care | Payer: Commercial Managed Care - PPO | Source: Ambulatory Visit | Attending: Radiation Oncology | Admitting: Radiation Oncology

## 2022-01-08 VITALS — BP 119/67 | HR 98 | Temp 97.6°F | Resp 15 | Wt 159.5 lb

## 2022-01-08 DIAGNOSIS — R300 Dysuria: Secondary | ICD-10-CM | POA: Diagnosis not present

## 2022-01-08 DIAGNOSIS — C50411 Malignant neoplasm of upper-outer quadrant of right female breast: Secondary | ICD-10-CM

## 2022-01-08 DIAGNOSIS — Z171 Estrogen receptor negative status [ER-]: Secondary | ICD-10-CM | POA: Diagnosis not present

## 2022-01-08 DIAGNOSIS — Z170 Estrogen receptor positive status [ER+]: Secondary | ICD-10-CM

## 2022-01-08 LAB — RAD ONC ARIA SESSION SUMMARY
Course Elapsed Days: 36
Plan Fractions Treated to Date: 14
Plan Fractions Treated to Date: 26
Plan Prescribed Dose Per Fraction: 1.8 Gy
Plan Prescribed Dose Per Fraction: 1.8 Gy
Plan Total Fractions Prescribed: 14
Plan Total Fractions Prescribed: 27
Plan Total Prescribed Dose: 25.2 Gy
Plan Total Prescribed Dose: 48.6 Gy
Reference Point Dosage Given to Date: 48.6 Gy
Reference Point Dosage Given to Date: 48.6 Gy
Reference Point Session Dosage Given: 1.8 Gy
Reference Point Session Dosage Given: 1.8 Gy
Session Number: 27

## 2022-01-08 LAB — URINALYSIS, COMPLETE (UACMP) WITH MICROSCOPIC
Bilirubin Urine: NEGATIVE
Glucose, UA: NEGATIVE mg/dL
Hgb urine dipstick: NEGATIVE
Ketones, ur: NEGATIVE mg/dL
Nitrite: POSITIVE — AB
Protein, ur: NEGATIVE mg/dL
Specific Gravity, Urine: 1.018 (ref 1.005–1.030)
pH: 7 (ref 5.0–8.0)

## 2022-01-08 MED ORDER — NITROFURANTOIN MONOHYD MACRO 100 MG PO CAPS: 100.0000 mg | ORAL_CAPSULE | Freq: Two times a day (BID) | ORAL | 0 refills | Status: AC

## 2022-01-08 NOTE — Progress Notes (Signed)
Symptom Management Consult note Loop    Patient Care Team: Group, [place] as PCP - General Mauro Kaufmann, RN as Oncology Nurse Navigator Rockwell Germany, RN as Oncology Nurse Navigator Nicholas Lose, MD as Consulting Physician (Hematology and Oncology) Coralie Keens, MD as Consulting Physician (General Surgery) Eppie Gibson, MD as Attending Physician (Radiation Oncology)    Name of the patient: Tara White  [id number]  02-13-1991   Date of visit: 01/08/2022   Chief Complaint/Reason for visit: dysuria   Current Therapy: Trastuzumab and Pertruzumab  Last treatment:  Day 1   Cycle 7 on 01/06/22   ASSESSMENT & PLAN: Patient is a 31 y.o. female  with oncologic history of malignant neoplasm of upper-outer quadrant of right breast in female, estrogen receptor negative  followed by Dr. Madolyn Frieze.  I have viewed most recent oncology note and lab work.    #) Malignant neoplasm of upper-outer quadrant of right breast in female, estrogen receptor negative  -Here for radiation today aft er [facility] appointment. Last radiation 01/11/22. - Next appointment with oncologist is 01/28/22   #) Dysuria -Patient had similar symptoms after cycle 6. Went to UC and had culture that was negative. Symptoms started 24 hours after cycle 7 and resolved with AZO. -Patient is well appearing, afebrile, HDS. Benign abdominal exam, no CVA tenderness. -UA today showing infection. Urine culture sent. Prescription for Macrobid sent to pharmacy. Previous culture in 04/2021 grew E. Coli with Macrobid sensitivity.  - Strict ED precautions discussed should symptoms worsen.       Heme/Onc History: Oncology History  Malignant neoplasm of upper-outer quadrant of right breast in female, estrogen receptor negative ([place])  04/08/2021 Initial Diagnosis   History of right breast eczematous change to the nipple, patient had palpable lump in the right breast her mammogram 2.1 cm  spiculated mass with extensive calcifications that span 11 cm.  Ultrasound revealed 3 cm mass at 10:00 plus small satellite lesions.  Biopsy revealed grade 3 IDC ER 0%, PR 0%, HER2 3+ positive, Ki-67 30%   04/08/2021 Cancer Staging   Staging form: Breast, AJCC 8th Edition - Clinical stage from 04/08/2021: Stage IIB (cT2, cN1, cM0, G3, ER-, PR-, HER2+) - Signed by Nicholas Lose, MD on 04/15/2021 Stage prefix: Initial diagnosis Method of lymph node assessment: Axillary lymph node dissection Histologic grading system: 3 grade system   05/01/2021 - 11/26/2021 Chemotherapy   Patient is on Treatment Plan : BREAST  Docetaxel + Carboplatin + Trastuzumab + Pertuzumab  (TCHP) q21d      05/21/2021 Genetic Testing   Negative hereditary cancer genetic testing: no pathogenic variants detected in [facility] CustomNext-Cancer +RNAinsight Panel.  Report date is 05/21/2021.   The CustomNext-Cancer+RNAinsight panel offered by [facility] includes sequencing and rearrangement analysis for the following 47 genes:  APC, ATM, AXIN2, BARD1, BMPR1A, BRCA1, BRCA2, BRIP1, CDH1, CDK4, CDKN2A, CHEK2, DICER1, EPCAM, GREM1, HOXB13, MEN1, MLH1, MSH2, MSH3, MSH6, MUTYH, NBN, NF1, NF2, NTHL1, PALB2, PMS2, POLD1, POLE, PTEN, RAD51C, RAD51D, RECQL, RET, SDHA, SDHAF2, SDHB, SDHC, SDHD, SMAD4, SMARCA4, STK11, TP53, TSC1, TSC2, and VHL.  RNA data is routinely analyzed for use in variant interpretation for all genes.   09/04/2021 -  Chemotherapy   Patient is on Treatment Plan : BREAST Trastuzumab  + Pertuzumab q21d x 13 cycles         Interval history-: Tara White is a 31 y.o. female with oncologic history as above [facility] with chief complaint of  dysuria and frequency x 2 days. Symptoms started approximately 24 hours after recent immunotherapy cycle 7 (10/11). She had similar symptoms after cycle 6 that she was seen at [facility] for. She reports being told possible UTI on UA so she was prescribed macrobid which she took for  3 days. The culture was negative so she discontinued antibiotic. She took an AZO last night and reports urinary symptoms improved. She overall feels well. Denies fever, chills, abdominal pain, nausea, vomiting, back pain, gross hematuria, pelvic pain, vaginal discharge.  Patient is sexually active however not concerned for STI.      ROS  All other systems are reviewed and are negative for acute change except as noted in the HPI.    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   Family history of breast cancer 04/21/2021   right breast ca 03/2021     Past Surgical History:  Procedure Laterality Date   BREAST RECONSTRUCTION WITH PLACEMENT OF TISSUE EXPANDER AND ALLODERM Right 10/13/2021   Procedure: RIGHT BREAST RECONSTRUCTION WITH PLACEMENT OF TISSUE EXPANDER AND ALLODERM;  Surgeon: Irene Limbo, MD;  Location: [place];  Service: Plastics;  Laterality: Right;   CESAREAN SECTION N/A 12/10/2015   Procedure: CESAREAN SECTION;  Surgeon: Aloha Gell, MD;  Location: [place];  Service: Obstetrics;  Laterality: N/A;   IR CV LINE INJECTION  06/25/2021   IR RADIOLOGY PERIPHERAL GUIDED IV START  06/25/2021   IR US GUIDE VASC ACCESS LEFT  06/25/2021   IR VENO/EXT/UNI LEFT  06/25/2021   NODE DISSECTION Right 10/13/2021   Procedure: TARGETED LYMPH NODE DISSECTION;  Surgeon: Coralie Keens, MD;  Location: [place];  Service: General;  Laterality: Right;   PORTACATH PLACEMENT Left 04/30/2021   Procedure: INSERTION PORT-A-CATH;  Surgeon: Coralie Keens, MD;  Location: [place];  Service: General;  Laterality: Left;   SIMPLE MASTECTOMY WITH AXILLARY SENTINEL NODE BIOPSY Right 10/13/2021   Procedure: RIGHT MASTECTOMY;  Surgeon: Coralie Keens, MD;  Location: [place];  Service: General;  Laterality: Right;   WISDOM TOOTH EXTRACTION       Social History   Socioeconomic History   Marital status: Married    Spouse name: Not on file   Number of children: Not on file   Years of education: Not on file   Highest education level: Not on file  Occupational History   Not on file  Tobacco Use   Smoking status: Never   Smokeless tobacco: Never  Vaping Use   Vaping Use: Not on file  Substance and Sexual Activity   Alcohol use: Not Currently    Alcohol/week: 2.0 standard drinks of alcohol    Types: 2 Standard drinks or equivalent per week   Drug use: No   Sexual activity: Not Currently    Partners: Male    Birth control/protection: Other-see comments    Comment: cycles have not resumed since receiving chemotherapy  Other Topics Concern   Not on file  Social History Narrative   Not on file   Social Determinants of Health   Financial Resource Strain: Not on file  Food Insecurity: Not on file  Transportation Needs: Not on file  Physical Activity: Not on file  Stress: Not on file  Social Connections: Not on file  Intimate Partner Violence: Not on file    Family History  Problem Relation Age of Onset  Hypothyroidism Mother    Breast cancer Maternal Grandmother 19   Skin cancer Maternal Grandfather    Heart failure Paternal Grandfather      Current Outpatient Medications:    nitrofurantoin, macrocrystal-monohydrate, (MACROBID) 100 MG capsule, Take 1 capsule (100 mg total) by mouth 2 (two) times daily for 5 days., Disp: 10 capsule, Rfl: 0   rivaroxaban (XARELTO) 20 MG TABS tablet, Take 1 tablet (20 mg total) by mouth daily with supper., Disp: 30 tablet, Rfl: 6  PHYSICAL EXAM: ECOG FS:1 - Symptomatic but completely ambulatory    Vitals:   01/08/22 1456  BP: 119/67  Pulse: 98  Resp: 15  Temp: 97.6 F (36.4 C)  TempSrc: Oral  SpO2: 100%  Weight: 159 lb 8 oz (72.3 kg)   Physical Exam Vitals and nursing note reviewed.  Constitutional:      Appearance: She is well-developed. She is not ill-appearing  or toxic-appearing.  HENT:     Head: Normocephalic.     Nose: Nose normal.  Eyes:     Conjunctiva/sclera: Conjunctivae normal.  Neck:     Vascular: No JVD.  Cardiovascular:     Rate and Rhythm: Normal rate and regular rhythm.     Pulses: Normal pulses.     Heart sounds: Normal heart sounds.  Pulmonary:     Effort: Pulmonary effort is normal.     Breath sounds: Normal breath sounds.  Abdominal:     General: There is no distension.     Palpations: Abdomen is soft. There is no mass.     Tenderness: There is no abdominal tenderness. There is no right CVA tenderness, left CVA tenderness, guarding or rebound.     Hernia: No hernia is present.  Musculoskeletal:     Cervical back: Normal range of motion.  Skin:    General: Skin is warm and dry.  Neurological:     Mental Status: She is oriented to person, place, and time.        LABORATORY DATA: I have reviewed the data as listed    Latest Ref Rng & Units 12/17/2021    9:30 AM 10/29/2021   12:19 PM 10/02/2021   11:46 AM  CBC  WBC 4.0 - 10.5 K/uL 4.7  5.2  6.6   Hemoglobin 12.0 - 15.0 g/dL 13.2  12.1  11.6   Hematocrit 36.0 - 46.0 % 37.9  34.9  33.1   Platelets 150 - 400 K/uL 188  200  174         Latest Ref Rng & Units 12/17/2021    9:30 AM 10/29/2021   12:19 PM 10/02/2021   11:46 AM  CMP  Glucose 70 - 99 mg/dL 79  93  82   BUN 6 - 20 mg/dL _0 Creatinine 0.44 - 1.00 mg/dL 1.01  1.07  0.90   Sodium 135 - 145 mmol/L 140  139  139   Potassium 3.5 - 5.1 mmol/L 3.9  4.0  3.9   Chloride 98 - 111 mmol/L 106  106  107   CO2 22 - 32 mmol/L _1 Calcium 8.9 - 10.3 mg/dL 9.4  9.1  9.4   Total Protein 6.5 - 8.1 g/dL 7.2  7.3  7.2   Total Bilirubin 0.3 - 1.2 mg/dL 0.7  0.4  0.5   Alkaline Phos 38 - 126 U/L 82  65  70   AST 15 - 41 U/L 18  20  17  ALT 0 - 44 U/L _0 RADIOGRAPHIC STUDIES (from last 24 hours if applicable) I have personally reviewed the radiological images as listed and agreed with  the findings in the report. No results found.      Visit Diagnosis: 1. Dysuria   2. Malignant neoplasm of upper-outer quadrant of right breast in female, estrogen receptor negative ([place])      No orders of the defined types were placed in this encounter.   All questions were answered. The patient knows to call the clinic with any problems, questions or concerns. No barriers to learning was detected.  I have spent a total of 20 minutes minutes of face-to-face and non-face-to-face time, preparing to see the patient, obtaining and/or reviewing separately obtained history, performing a medically appropriate examination, counseling and educating the patient, ordering tests, documenting clinical information in the electronic health record, and care coordination (communications with other health care professionals or caregivers).    Thank you for allowing me to participate in the care of this patient.    Barrie Folk, PA-C Department of Hematology/Oncology [facility] at [facility] Phone: [phone]  [phone]    01/08/2022 4:59 PM

## 2022-01-10 LAB — URINE CULTURE: Culture: 60000 — AB

## 2022-01-11 ENCOUNTER — Ambulatory Visit: Payer: Commercial Managed Care - PPO

## 2022-01-11 ENCOUNTER — Ambulatory Visit
Admission: RE | Admit: 2022-01-11 | Discharge: 2022-01-11 | Disposition: A | Discharge status: Home / Self Care | Payer: Commercial Managed Care - PPO | Source: Ambulatory Visit | Attending: Radiation Oncology | Admitting: Radiation Oncology

## 2022-01-11 ENCOUNTER — Encounter: Payer: Self-pay | Admitting: *Deleted

## 2022-01-11 ENCOUNTER — Encounter: Payer: Self-pay | Admitting: Radiation Oncology

## 2022-01-11 ENCOUNTER — Other Ambulatory Visit: Payer: Self-pay

## 2022-01-11 DIAGNOSIS — C50411 Malignant neoplasm of upper-outer quadrant of right female breast: Secondary | ICD-10-CM | POA: Diagnosis not present

## 2022-01-11 LAB — RAD ONC ARIA SESSION SUMMARY
Course Elapsed Days: 39
Plan Fractions Treated to Date: 14
Plan Fractions Treated to Date: 27
Plan Prescribed Dose Per Fraction: 1.8 Gy
Plan Prescribed Dose Per Fraction: 1.8 Gy
Plan Total Fractions Prescribed: 14
Plan Total Fractions Prescribed: 27
Plan Total Prescribed Dose: 25.2 Gy
Plan Total Prescribed Dose: 48.6 Gy
Reference Point Dosage Given to Date: 50.4 Gy
Reference Point Dosage Given to Date: 50.4 Gy
Reference Point Session Dosage Given: 1.8 Gy
Reference Point Session Dosage Given: 1.8 Gy
Session Number: 28

## 2022-01-17 ENCOUNTER — Encounter: Payer: Self-pay | Admitting: Physician Assistant

## 2022-01-18 ENCOUNTER — Ambulatory Visit: Payer: Commercial Managed Care - PPO

## 2022-01-18 ENCOUNTER — Telehealth: Payer: Self-pay

## 2022-01-18 NOTE — Telephone Encounter (Signed)
Left voicemail for patient regarding continued UTI symptoms. Patient was advised to call [facility] back to schedule an appointment.

## 2022-01-23 NOTE — Progress Notes (Signed)
Patient Care Team: Group, [facility] as PCP - Philomena Doheny, Paulette Blanch, RN as Oncology Nurse Navigator Rockwell Germany, RN as Oncology Nurse Navigator Nicholas Lose, MD as Consulting Physician (Hematology and Oncology) Coralie Keens, MD as Consulting Physician (General Surgery) Eppie Gibson, MD as Attending Physician (Radiation Oncology)  DIAGNOSIS: No diagnosis found.  SUMMARY OF ONCOLOGIC HISTORY: Oncology History  Malignant neoplasm of upper-outer quadrant of right breast in female, estrogen receptor negative ([place])  04/08/2021 Initial Diagnosis   History of right breast eczematous change to the nipple, patient had palpable lump in the right breast her mammogram 2.1 cm spiculated mass with extensive calcifications that span 11 cm.  Ultrasound revealed 3 cm mass at 10:00 plus small satellite lesions.  Biopsy revealed grade 3 IDC ER 0%, PR 0%, HER2 3+ positive, Ki-67 30%   04/08/2021 Cancer Staging   Staging form: Breast, AJCC 8th Edition - Clinical stage from 04/08/2021: Stage IIB (cT2, cN1, cM0, G3, ER-, PR-, HER2+) - Signed by Nicholas Lose, MD on 04/15/2021 Stage prefix: Initial diagnosis Method of lymph node assessment: Axillary lymph node dissection Histologic grading system: 3 grade system   05/01/2021 - 11/26/2021 Chemotherapy   Patient is on Treatment Plan : BREAST  Docetaxel + Carboplatin + Trastuzumab + Pertuzumab  (TCHP) q21d      05/21/2021 Genetic Testing   Negative hereditary cancer genetic testing: no pathogenic variants detected in [facility] CustomNext-Cancer +RNAinsight Panel.  Report date is 05/21/2021.   The CustomNext-Cancer+RNAinsight panel offered by [facility] includes sequencing and rearrangement analysis for the following 47 genes:  APC, ATM, AXIN2, BARD1, BMPR1A, BRCA1, BRCA2, BRIP1, CDH1, CDK4, CDKN2A, CHEK2, DICER1, EPCAM, GREM1, HOXB13, MEN1, MLH1, MSH2, MSH3, MSH6, MUTYH, NBN, NF1, NF2, NTHL1, PALB2, PMS2, POLD1, POLE, PTEN, RAD51C, RAD51D, RECQL,  RET, SDHA, SDHAF2, SDHB, SDHC, SDHD, SMAD4, SMARCA4, STK11, TP53, TSC1, TSC2, and VHL.  RNA data is routinely analyzed for use in variant interpretation for all genes.   09/04/2021 -  Chemotherapy   Patient is on Treatment Plan : BREAST Trastuzumab  + Pertuzumab q21d x 13 cycles       CHIEF COMPLIANT: Follow-up on Herceptin Perjeta  INTERVAL HISTORY: Tara White is a 31 y.o with the above mention currently on Herceptin Prejeta. She presents to the clinic today for follow-up.    ALLERGIES:  has No Known Allergies.  MEDICATIONS:  Current Outpatient Medications  Medication Sig Dispense Refill   rivaroxaban (XARELTO) 20 MG TABS tablet Take 1 tablet (20 mg total) by mouth daily with supper. 30 tablet 6   No current facility-administered medications for this visit.    PHYSICAL EXAMINATION: ECOG PERFORMANCE STATUS: {CHL ONC ECOG PS:[id number]}  There were no vitals filed for this visit. There were no vitals filed for this visit.  BREAST:*** No palpable masses or nodules in either right or left breasts. No palpable axillary supraclavicular or infraclavicular adenopathy no breast tenderness or nipple discharge. (exam performed in the presence of a chaperone)  LABORATORY DATA:  I have reviewed the data as listed    Latest Ref Rng & Units 12/17/2021    9:30 AM 10/29/2021   12:19 PM 10/02/2021   11:46 AM  CMP  Glucose 70 - 99 mg/dL 79  93  82   BUN 6 - 20 mg/dL _0 Creatinine 0.44 - 1.00 mg/dL 1.01  1.07  0.90   Sodium 135 - 145 mmol/L 140  139  139   Potassium 3.5 - 5.1 mmol/L  3.9  4.0  3.9   Chloride 98 - 111 mmol/L 106  106  107   CO2 22 - 32 mmol/L _0 Calcium 8.9 - 10.3 mg/dL 9.4  9.1  9.4   Total Protein 6.5 - 8.1 g/dL 7.2  7.3  7.2   Total Bilirubin 0.3 - 1.2 mg/dL 0.7  0.4  0.5   Alkaline Phos 38 - 126 U/L 82  65  70   AST 15 - 41 U/L _1 ALT 0 - 44 U/L _2 Lab Results  Component Value Date   WBC 4.7 12/17/2021   HGB 13.2  12/17/2021   HCT 37.9 12/17/2021   MCV 87.9 12/17/2021   PLT 188 12/17/2021   NEUTROABS 2.7 12/17/2021    ASSESSMENT & PLAN:  No problem-specific Assessment & Plan notes found for this encounter.    No orders of the defined types were placed in this encounter.  The patient has a good understanding of the overall plan. she agrees with it. she will call with any problems that may develop before the next visit here. Total time spent: 30 mins including face to face time and time spent for planning, charting and co-ordination of care   Suzzette Righter, Roy 01/23/22    I Gardiner Coins am scribing for Dr. Lindi Adie  ***

## 2022-01-24 ENCOUNTER — Encounter: Payer: Self-pay | Admitting: Hematology and Oncology

## 2022-01-25 ENCOUNTER — Other Ambulatory Visit: Payer: Self-pay | Admitting: *Deleted

## 2022-01-25 ENCOUNTER — Inpatient Hospital Stay: Payer: Commercial Managed Care - PPO

## 2022-01-25 ENCOUNTER — Other Ambulatory Visit: Payer: Self-pay

## 2022-01-25 DIAGNOSIS — C50411 Malignant neoplasm of upper-outer quadrant of right female breast: Secondary | ICD-10-CM | POA: Diagnosis not present

## 2022-01-25 DIAGNOSIS — Z170 Estrogen receptor positive status [ER+]: Secondary | ICD-10-CM

## 2022-01-25 LAB — URINALYSIS, COMPLETE (UACMP) WITH MICROSCOPIC
Bacteria, UA: NONE SEEN
Bilirubin Urine: NEGATIVE
Glucose, UA: NEGATIVE mg/dL
Hgb urine dipstick: NEGATIVE
Ketones, ur: NEGATIVE mg/dL
Leukocytes,Ua: NEGATIVE
Nitrite: POSITIVE — AB
Protein, ur: NEGATIVE mg/dL
Specific Gravity, Urine: 1.008 (ref 1.005–1.030)
pH: 5 (ref 5.0–8.0)

## 2022-01-26 ENCOUNTER — Encounter: Payer: Self-pay | Admitting: Hematology and Oncology

## 2022-01-26 LAB — URINE CULTURE: Culture: NO GROWTH

## 2022-01-26 NOTE — Progress Notes (Signed)
                                                                                                                                                             Patient Name: Tara White MRN: [id number] DOB: 04/17/1990 Referring Physician: [place] Date of Service: 01/11/2022 [facility], [place]                                                        End Of Treatment Note  Diagnoses: C50.411-Malignant neoplasm of upper-outer quadrant of right female breast  Cancer Staging:  Cancer Staging  Malignant neoplasm of upper-outer quadrant of right breast in female, estrogen receptor negative ([place]) Staging form: Breast, AJCC 8th Edition - Clinical stage from 04/08/2021: Stage IIB (cT2, cN1, cM0, G3, ER-, PR-, HER2+) - Signed by Nicholas Lose, MD on 04/15/2021 Stage prefix: Initial diagnosis Method of lymph node assessment: Axillary lymph node dissection Histologic grading system: 3 grade system  ypT0, ypN0  Intent: Curative  Radiation Treatment Dates: 12/03/2021 through 01/11/2022 Site Technique Total Dose (Gy) Dose per Fx (Gy) Completed Fx Beam Energies  Chest Wall, Right: CW_R_IMN 3D 50.4/50.4 1.8 28/28 6XFFF  Chest Wall, Right: CW_R_PAB_SCV 3D 50.4/50.4 1.8 28/28 6X, 10X   Narrative: The patient tolerated radiation therapy relatively well.   Plan: The patient will follow-up with radiation oncology in 1 mo and/or PRN. -----------------------------------  Eppie Gibson, MD

## 2022-01-27 ENCOUNTER — Other Ambulatory Visit: Payer: Self-pay | Admitting: *Deleted

## 2022-01-27 MED ORDER — CIPROFLOXACIN HCL 500 MG PO TABS: 500.0000 mg | ORAL_TABLET | Freq: Two times a day (BID) | ORAL | 0 refills | Status: DC

## 2022-01-27 NOTE — Progress Notes (Signed)
Received message from pt with UTI symptom.  Per MD based on pt symptoms and UA, pt needing to be prescribed Cipro 500 mg p.o BID x7 days.  Prescription sent to pharmacy on file.  Pt educated and verbalized understanding.

## 2022-01-28 ENCOUNTER — Encounter: Payer: Self-pay | Admitting: Hematology and Oncology

## 2022-01-28 ENCOUNTER — Inpatient Hospital Stay: Payer: Commercial Managed Care - PPO | Attending: Hematology and Oncology | Admitting: Hematology and Oncology

## 2022-01-28 ENCOUNTER — Inpatient Hospital Stay: Payer: Commercial Managed Care - PPO

## 2022-01-28 ENCOUNTER — Other Ambulatory Visit: Payer: Self-pay | Admitting: *Deleted

## 2022-01-28 ENCOUNTER — Other Ambulatory Visit: Payer: Self-pay

## 2022-01-28 VITALS — BP 110/62 | HR 63 | Temp 97.6°F | Resp 18

## 2022-01-28 VITALS — BP 131/71 | HR 66 | Temp 97.7°F | Resp 18 | Ht 69.0 in | Wt 157.8 lb

## 2022-01-28 DIAGNOSIS — C50411 Malignant neoplasm of upper-outer quadrant of right female breast: Secondary | ICD-10-CM | POA: Insufficient documentation

## 2022-01-28 DIAGNOSIS — Z171 Estrogen receptor negative status [ER-]: Secondary | ICD-10-CM | POA: Diagnosis not present

## 2022-01-28 DIAGNOSIS — Z9011 Acquired absence of right breast and nipple: Secondary | ICD-10-CM | POA: Diagnosis not present

## 2022-01-28 DIAGNOSIS — Z5111 Encounter for antineoplastic chemotherapy: Secondary | ICD-10-CM | POA: Insufficient documentation

## 2022-01-28 DIAGNOSIS — Z79899 Other long term (current) drug therapy: Secondary | ICD-10-CM

## 2022-01-28 DIAGNOSIS — Z95828 Presence of other vascular implants and grafts: Secondary | ICD-10-CM

## 2022-01-28 LAB — CMP (CANCER CENTER ONLY)
ALT: 19 U/L (ref 0–44)
AST: 21 U/L (ref 15–41)
Albumin: 4.3 g/dL (ref 3.5–5.0)
Alkaline Phosphatase: 82 U/L (ref 38–126)
Anion gap: 5 (ref 5–15)
BUN: 20 mg/dL (ref 6–20)
CO2: 27 mmol/L (ref 22–32)
Calcium: 9.1 mg/dL (ref 8.9–10.3)
Chloride: 106 mmol/L (ref 98–111)
Creatinine: 1.08 mg/dL — ABNORMAL HIGH (ref 0.44–1.00)
GFR, Estimated: >60 mL/min (ref >60)
Glucose, Bld: 80 mg/dL (ref 70–99)
Potassium: 4 mmol/L (ref 3.5–5.1)
Sodium: 138 mmol/L (ref 135–145)
Total Bilirubin: 0.6 mg/dL (ref 0.3–1.2)
Total Protein: 7.3 g/dL (ref 6.5–8.1)

## 2022-01-28 LAB — CBC WITH DIFFERENTIAL (CANCER CENTER ONLY)
Abs Immature Granulocytes: 0.02 10*3/uL (ref 0.00–0.07)
Basophils Absolute: 0 10*3/uL (ref 0.0–0.1)
Basophils Relative: 0 %
Eosinophils Absolute: 0.1 10*3/uL (ref 0.0–0.5)
Eosinophils Relative: 1 %
HCT: 34.7 % — ABNORMAL LOW (ref 36.0–46.0)
Hemoglobin: 12.4 g/dL (ref 12.0–15.0)
Immature Granulocytes: 0 %
Lymphocytes Relative: 18 %
Lymphs Abs: 0.9 10*3/uL (ref 0.7–4.0)
MCH: 31.6 pg (ref 26.0–34.0)
MCHC: 35.7 g/dL (ref 30.0–36.0)
MCV: 88.3 fL (ref 80.0–100.0)
Monocytes Absolute: 0.3 10*3/uL (ref 0.1–1.0)
Monocytes Relative: 6 %
Neutro Abs: 3.6 10*3/uL (ref 1.7–7.7)
Neutrophils Relative %: 75 %
Platelet Count: 166 10*3/uL (ref 150–400)
RBC: 3.93 MIL/uL (ref 3.87–5.11)
RDW: 13.2 % (ref 11.5–15.5)
WBC Count: 4.9 10*3/uL (ref 4.0–10.5)
nRBC: 0 % (ref 0.0–0.2)

## 2022-01-28 LAB — VITAMIN D 25 HYDROXY (VIT D DEFICIENCY, FRACTURES): Vit D, 25-Hydroxy: 38.32 ng/mL (ref 30–100)

## 2022-01-28 MED ORDER — SODIUM CHLORIDE 0.9 % IV SOLN: Freq: Once | INTRAVENOUS | Status: AC

## 2022-01-28 MED ORDER — HEPARIN SOD (PORK) LOCK FLUSH 100 UNIT/ML IV SOLN
500.0000 [IU] | Freq: Once | INTRAVENOUS | Status: AC | PRN
  Administered 2022-01-28: 500 [IU]

## 2022-01-28 MED ORDER — ACETAMINOPHEN 325 MG PO TABS
650.0000 mg | ORAL_TABLET | Freq: Once | ORAL | Status: AC
  Administered 2022-01-28: 650 mg via ORAL
  Filled 2022-01-28: qty 2

## 2022-01-28 MED ORDER — SODIUM CHLORIDE 0.9% FLUSH
10.0000 mL | Freq: Once | INTRAVENOUS | Status: AC
  Administered 2022-01-28: 10 mL

## 2022-01-28 MED ORDER — SODIUM CHLORIDE 0.9% FLUSH
10.0000 mL | INTRAVENOUS | Status: DC | PRN
  Administered 2022-01-28: 10 mL

## 2022-01-28 MED ORDER — TRASTUZUMAB-ANNS CHEMO 150 MG IV SOLR
6.0000 mg/kg | Freq: Once | INTRAVENOUS | Status: AC
  Administered 2022-01-28: 420 mg via INTRAVENOUS
  Filled 2022-01-28: qty 20

## 2022-01-28 MED ORDER — DIPHENHYDRAMINE HCL 25 MG PO CAPS
50.0000 mg | ORAL_CAPSULE | Freq: Once | ORAL | Status: AC
  Administered 2022-01-28: 50 mg via ORAL
  Filled 2022-01-28: qty 2

## 2022-01-28 MED ORDER — SODIUM CHLORIDE 0.9 % IV SOLN
420.0000 mg | Freq: Once | INTRAVENOUS | Status: AC
  Administered 2022-01-28: 420 mg via INTRAVENOUS
  Filled 2022-01-28: qty 14

## 2022-01-28 NOTE — Patient Instructions (Signed)
Tara White ONCOLOGY  Discharge Instructions: Thank you for choosing [facility] to provide your oncology and hematology care.   If you have a lab appointment with the [facility], please go directly to the [facility] and check in at the registration area.   Wear comfortable clothing and clothing appropriate for easy access to any Portacath or PICC line.   We strive to give you quality time with your provider. You may need to reschedule your appointment if you arrive late (15 or more minutes).  Arriving late affects you and other patients whose appointments are after yours.  Also, if you miss three or more appointments without notifying the office, you may be dismissed from the clinic at the provider's discretion.      For prescription refill requests, have your pharmacy contact our office and allow 72 hours for refills to be completed.    Today you received the following chemotherapy and/or immunotherapy agents: Kanjinti, Pertuzumab.       To help prevent nausea and vomiting after your treatment, we encourage you to take your nausea medication as directed.  BELOW ARE SYMPTOMS THAT SHOULD BE REPORTED IMMEDIATELY: *FEVER GREATER THAN 100.4 F (38 C) OR HIGHER *CHILLS OR SWEATING *NAUSEA AND VOMITING THAT IS NOT CONTROLLED WITH YOUR NAUSEA MEDICATION *UNUSUAL SHORTNESS OF BREATH *UNUSUAL BRUISING OR BLEEDING *URINARY PROBLEMS (pain or burning when urinating, or frequent urination) *BOWEL PROBLEMS (unusual diarrhea, constipation, pain near the anus) TENDERNESS IN MOUTH AND THROAT WITH OR WITHOUT PRESENCE OF ULCERS (sore throat, sores in mouth, or a toothache) UNUSUAL RASH, SWELLING OR PAIN  UNUSUAL VAGINAL DISCHARGE OR ITCHING   Items with * indicate a potential emergency and should be followed up as soon as possible or go to the Emergency Department if any problems should occur.  Please show the CHEMOTHERAPY ALERT CARD or IMMUNOTHERAPY ALERT CARD at  check-in to the Emergency Department and triage nurse.  Should you have questions after your visit or need to cancel or reschedule your appointment, please contact [facility]  Dept: [phone]  and follow the prompts.  Office hours are 8:00 a.m. to 4:30 p.m. Monday - Friday. Please note that voicemails left after 4:00 p.m. may not be returned until the following business day.  We are closed weekends and major holidays. You have access to a nurse at all times for urgent questions. Please call the main number to the clinic Dept: [phone] and follow the prompts.   For any non-urgent questions, you may also contact your provider using MyChart. We now offer e-Visits for anyone 41 and older to request care online for non-urgent symptoms. For details visit [email or web address].   Also download the MyChart app! Go to the app store, search "MyChart", open the app, select [facility], and log in with your MyChart username and password.  Masks are optional in the cancer centers. If you would like for your care team to wear a mask while they are taking care of you, please let them know. You may have one support person who is at least 32 years old accompany you for your appointments.

## 2022-01-28 NOTE — Assessment & Plan Note (Addendum)
04/08/2021:History of right breast eczematous change to the nipple, patient had palpable lump in the right breast her mammogram 2.1 cm spiculated mass with extensive calcifications that span 11 cm.  Ultrasound revealed 3 cm mass at 10:00 plus small satellite lesions.  Biopsy revealed grade 3 IDC ER 0%, PR 0%, HER2 3+ positive, Ki-67 30%   Recommendation: 1. Neoadjuvant chemotherapy with TCH Perjeta 6 cycles completed 08/12/2021 followed by Herceptin Perjeta maintenance for 1 year 2. right mastectomy with reconstruction 10/13/2021: No residual malignancy complete pathologic response 0/5 lymph nodes negative 3. Followed by adjuvant radiation therapy to complete 01/11/2022 ----------------------------------------------------------------------------------------------------------------------------------------------- Treatment plan: Herceptin and Perjeta maintenance Toxicities: 1.  Acneform rash on the face:  She used topical clindamycin.  It resolved her acne. 2. intermittent diarrhea: Not bothering her. 3.  Recent urinary tract infection: Currently on ciprofloxacin   Return to clinic every 3 weeks for Herceptin Perjeta and every 6 weeks for follow-up with me.

## 2022-01-28 NOTE — Progress Notes (Signed)
Per Lindi Adie MD, ok to treat with ECHO results from 08/20/2021, EF 60/65%  Pt declined 30 minute wait period post Pertuzumab infusion. VSS. No complaints at time of discharge.

## 2022-02-04 ENCOUNTER — Encounter: Payer: Self-pay | Admitting: Radiation Oncology

## 2022-02-04 ENCOUNTER — Telehealth: Payer: Self-pay

## 2022-02-04 NOTE — Progress Notes (Addendum)
I called the patient today about her upcoming follow-up appointment in radiation oncology.   Given the state of the COVID-19 pandemic, concerning case numbers in our community, and guidance from [facility], I offered a phone assessment with the patient to determine if coming to the clinic was necessary. She accepted.  The patient denies any symptomatic concerns.  Specifically, they report good healing of their skin in the radiation fields.  Skin is intact.  She did report some dryness and flakiness, Rn encouraged use of Vitamin E Lotion of cream.   I recommended that she continue skin care by applying oil or lotion with vitamin E to the skin in the radiation fields, BID, for 2 more months.  Continue follow-up with medical oncology - follow-up is scheduled on 02-17-22 for iv infusion. Pt last saw Dr. Lindi Adie on 01-28-22. I explained that yearly mammograms are important for patients with intact breast tissue, and physical exams are important after mastectomy for patients that cannot undergo mammography.  I encouraged her to call if she had further questions or concerns about her healing. Otherwise, she will follow-up PRN in radiation oncology. Patient is pleased with this plan, and we will cancel her upcoming follow-up to reduce the risk of COVID-19 transmission.

## 2022-02-04 NOTE — Telephone Encounter (Addendum)
   I called the patient today about her upcoming follow-up appointment in radiation oncology.    Given the state of the COVID-19 pandemic, concerning case numbers in our community, and guidance from [facility], I offered a phone assessment with the patient to determine if coming to the clinic was necessary. She accepted.   The patient denies any symptomatic concerns.  Specifically, they report good healing of their skin in the radiation fields.  Skin is intact.  She did report some dryness and flakiness, Rn encouraged use of Vitamin E Lotion or cream. She denied pain or fatigue.    I recommended that she continue skin care by applying oil or lotion with vitamin E to the skin in the radiation fields, BID, for 2 more months.  Continue follow-up with medical oncology - follow-up is scheduled on 02-17-22 for iv infusion. Pt last saw Dr. Lindi Adie on 01-28-22. I explained that yearly mammograms are important for patients with intact breast tissue, and physical exams are important after mastectomy for patients that cannot undergo mammography.   I encouraged her to call if she had further questions or concerns about her healing. Otherwise, she will follow-up PRN in radiation oncology. Patient is pleased with this plan, and we will cancel her upcoming follow-up to reduce the risk of COVID-19 transmission.

## 2022-02-05 ENCOUNTER — Other Ambulatory Visit (HOSPITAL_COMMUNITY): Payer: Commercial Managed Care - PPO

## 2022-02-05 ENCOUNTER — Telehealth: Payer: Self-pay | Admitting: Hematology and Oncology

## 2022-02-05 NOTE — Telephone Encounter (Signed)
Scheduled appointment per WQ. Left voicemail. 

## 2022-02-06 ENCOUNTER — Encounter: Payer: Self-pay | Admitting: Hematology and Oncology

## 2022-02-07 ENCOUNTER — Other Ambulatory Visit: Payer: Self-pay

## 2022-02-08 ENCOUNTER — Ambulatory Visit (HOSPITAL_COMMUNITY)
Admission: RE | Admit: 2022-02-08 | Discharge: 2022-02-08 | Disposition: A | Discharge status: Home / Self Care | Payer: Commercial Managed Care - PPO | Source: Ambulatory Visit | Attending: Hematology and Oncology | Admitting: Hematology and Oncology

## 2022-02-08 ENCOUNTER — Ambulatory Visit: Payer: Commercial Managed Care - PPO | Attending: Surgery

## 2022-02-08 ENCOUNTER — Other Ambulatory Visit: Payer: Self-pay | Admitting: *Deleted

## 2022-02-08 VITALS — Wt 158.5 lb

## 2022-02-08 DIAGNOSIS — Z0189 Encounter for other specified special examinations: Secondary | ICD-10-CM

## 2022-02-08 DIAGNOSIS — Z803 Family history of malignant neoplasm of breast: Secondary | ICD-10-CM | POA: Insufficient documentation

## 2022-02-08 DIAGNOSIS — Z171 Estrogen receptor negative status [ER-]: Secondary | ICD-10-CM

## 2022-02-08 DIAGNOSIS — Z79899 Other long term (current) drug therapy: Secondary | ICD-10-CM

## 2022-02-08 DIAGNOSIS — Z5181 Encounter for therapeutic drug level monitoring: Secondary | ICD-10-CM

## 2022-02-08 DIAGNOSIS — Z483 Aftercare following surgery for neoplasm: Secondary | ICD-10-CM | POA: Insufficient documentation

## 2022-02-08 DIAGNOSIS — C50919 Malignant neoplasm of unspecified site of unspecified female breast: Secondary | ICD-10-CM | POA: Diagnosis present

## 2022-02-08 LAB — ECHOCARDIOGRAM COMPLETE
Area-P 1/2: 3.91 cm2
Calc EF: 53.6 %
MV VTI: 2.13 cm2
S' Lateral: 3.7 cm
Single Plane A2C EF: 47.1 %
Single Plane A4C EF: 58.7 %

## 2022-02-08 NOTE — Therapy (Signed)
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   Family history of breast cancer 04/21/2021   right breast ca 03/2021   Past Surgical History:  Procedure Laterality Date   BREAST RECONSTRUCTION WITH PLACEMENT OF TISSUE EXPANDER AND ALLODERM Right 10/13/2021   Procedure: RIGHT BREAST RECONSTRUCTION WITH PLACEMENT OF TISSUE EXPANDER AND ALLODERM;  Surgeon: Irene Limbo, MD;  Location: [facility];  Service: Plastics;  Laterality: Right;   CESAREAN SECTION N/A 12/10/2015   Procedure: CESAREAN SECTION;  Surgeon: Aloha Gell, MD;  Location: [facility];  Service: Obstetrics;  Laterality: N/A;   IR CV LINE INJECTION  06/25/2021   IR RADIOLOGY PERIPHERAL GUIDED IV START  06/25/2021   IR US GUIDE VASC ACCESS LEFT  06/25/2021   IR VENO/EXT/UNI LEFT  06/25/2021   NODE DISSECTION Right 10/13/2021   Procedure: TARGETED LYMPH NODE DISSECTION;  Surgeon: Coralie Keens, MD;  Location: [facility];  Service: General;  Laterality: Right;   PORTACATH PLACEMENT Left 04/30/2021   Procedure: INSERTION PORT-A-CATH;  Surgeon: Coralie Keens, MD;  Location: [facility];  Service: General;  Laterality: Left;   SIMPLE MASTECTOMY WITH AXILLARY SENTINEL NODE BIOPSY Right 10/13/2021   Procedure: RIGHT MASTECTOMY;  Surgeon: Coralie Keens, MD;  Location: [facility];  Service: General;  Laterality: Right;   [place] EXTRACTION     Patient Active Problem List   Diagnosis Date Noted   Breast cancer, right ([place]) 10/13/2021   Port-A-Cath in place 07/17/2021   Family history of breast cancer 04/21/2021   Genetic testing 04/21/2021   Malignant neoplasm of upper-outer quadrant of right breast in female, estrogen receptor negative ([place]) 04/14/2021    REFERRING DIAG: right breast cancer at risk for lymphedema  THERAPY DIAG:  Aftercare following surgery for neoplasm  PERTINENT HISTORY: Patient was diagnosed on 04/08/2021 with right grade III invasive ductal carcinoma breast cancer. It measures 3 cm with 10.8 cm of calcifications and is located in the upper outer quadrant. It is ER/PR negative and HER2 positive with a Ki67 of 30%. She has a biopsied positive axillary lymph node. 10/13/21- R mastectomy and SLNB (0/5)with reconstruction and expander placed   PRECAUTIONS: right UE Lymphedema risk, None  SUBJECTIVE: Pt returns for her first 3 month L-Dex screen.   PAIN:  Are you having pain? No  SOZO SCREENING: Patient was assessed today using the SOZO machine to determine the lymphedema index score. This was compared to her baseline score. It was determined that she is within the recommended range when compared to her baseline and no further action is needed at this time. She will continue SOZO screenings. These are done every 3 months for 2 years post operatively followed by every 6 months for 2 years, and then annually.   L-DEX FLOWSHEETS -  02/08/22 1000       L-DEX LYMPHEDEMA SCREENING   Measurement Type Unilateral    L-DEX MEASUREMENT EXTREMITY Upper Extremity    POSITION  Standing    DOMINANT SIDE Right    At Risk Side Right    BASELINE SCORE (UNILATERAL) -7.4    L-DEX SCORE (UNILATERAL) -11.6    VALUE CHANGE (UNILAT) -4.2               Otelia Limes, PTA 02/08/2022, 10:53 AM

## 2022-02-08 NOTE — Progress Notes (Signed)
  Echocardiogram 2D Echocardiogram has been performed.  Tara White 02/08/2022, 10:03 AM

## 2022-02-09 ENCOUNTER — Other Ambulatory Visit: Payer: Self-pay

## 2022-02-09 ENCOUNTER — Inpatient Hospital Stay
Admission: RE | Admit: 2022-02-09 | Discharge: 2022-02-09 | Disposition: A | Discharge status: Home / Self Care | Payer: Self-pay | Source: Ambulatory Visit | Attending: Radiation Oncology | Admitting: Radiation Oncology

## 2022-02-09 DIAGNOSIS — Z171 Estrogen receptor negative status [ER-]: Secondary | ICD-10-CM

## 2022-02-12 ENCOUNTER — Telehealth: Payer: Self-pay | Admitting: Hematology and Oncology

## 2022-02-12 NOTE — Telephone Encounter (Signed)
Patient called 11/17 to reschedule 1/3 appointment. Rescheduled with patient.

## 2022-02-17 ENCOUNTER — Inpatient Hospital Stay: Payer: Commercial Managed Care - PPO

## 2022-02-17 ENCOUNTER — Other Ambulatory Visit: Payer: Self-pay

## 2022-02-17 VITALS — BP 114/64 | HR 62 | Temp 97.5°F | Resp 17

## 2022-02-17 DIAGNOSIS — Z171 Estrogen receptor negative status [ER-]: Secondary | ICD-10-CM

## 2022-02-17 DIAGNOSIS — C50411 Malignant neoplasm of upper-outer quadrant of right female breast: Secondary | ICD-10-CM | POA: Diagnosis not present

## 2022-02-17 MED ORDER — SODIUM CHLORIDE 0.9 % IV SOLN
420.0000 mg | Freq: Once | INTRAVENOUS | Status: AC
  Administered 2022-02-17: 420 mg via INTRAVENOUS
  Filled 2022-02-17: qty 14

## 2022-02-17 MED ORDER — TRASTUZUMAB-ANNS CHEMO 150 MG IV SOLR
6.0000 mg/kg | Freq: Once | INTRAVENOUS | Status: AC
  Administered 2022-02-17: 420 mg via INTRAVENOUS
  Filled 2022-02-17: qty 20

## 2022-02-17 MED ORDER — SODIUM CHLORIDE 0.9% FLUSH
10.0000 mL | INTRAVENOUS | Status: DC | PRN
  Administered 2022-02-17: 10 mL

## 2022-02-17 MED ORDER — DIPHENHYDRAMINE HCL 25 MG PO CAPS
50.0000 mg | ORAL_CAPSULE | Freq: Once | ORAL | Status: AC
  Administered 2022-02-17: 50 mg via ORAL
  Filled 2022-02-17: qty 2

## 2022-02-17 MED ORDER — ACETAMINOPHEN 325 MG PO TABS
650.0000 mg | ORAL_TABLET | Freq: Once | ORAL | Status: AC
  Administered 2022-02-17: 650 mg via ORAL
  Filled 2022-02-17: qty 2

## 2022-02-17 MED ORDER — SODIUM CHLORIDE 0.9 % IV SOLN: Freq: Once | INTRAVENOUS | Status: AC

## 2022-02-17 MED ORDER — HEPARIN SOD (PORK) LOCK FLUSH 100 UNIT/ML IV SOLN
500.0000 [IU] | Freq: Once | INTRAVENOUS | Status: AC | PRN
  Administered 2022-02-17: 500 [IU]

## 2022-02-17 NOTE — Patient Instructions (Signed)
Trastuzumab Injection What is this medication? TRASTUZUMAB (tras TOO zoo mab) treats breast cancer and stomach cancer. It works by blocking a protein that causes cancer cells to grow and multiply. This helps to slow or stop the spread of cancer cells. This medicine may be used for other purposes; ask your health care provider or pharmacist if you have questions. COMMON BRAND NAME(S): Herceptin, Herzuma, KANJINTI, Ogivri, Ontruzant, Trazimera What should I tell my care team before I take this medication? They need to know if you have any of these conditions: Heart failure Lung disease An unusual or allergic reaction to trastuzumab, other medications, foods, dyes, or preservatives Pregnant or trying to get pregnant Breast-feeding How should I use this medication? This medication is injected into a vein. It is given by your care team in a hospital or clinic setting. Talk to your care team about the use of this medication in children. It is not approved for use in children. Overdosage: If you think you have taken too much of this medicine contact a poison control center or emergency room at once. NOTE: This medicine is only for you. Do not share this medicine with others. What if I miss a dose? Keep appointments for follow-up doses. It is important not to miss your dose. Call your care team if you are unable to keep an appointment. What may interact with this medication? Certain types of chemotherapy, such as daunorubicin, doxorubicin, epirubicin, idarubicin This list may not describe all possible interactions. Give your health care provider a list of all the medicines, herbs, non-prescription drugs, or dietary supplements you use. Also tell them if you smoke, drink alcohol, or use illegal drugs. Some items may interact with your medicine. What should I watch for while using this medication? Your condition will be monitored carefully while you are receiving this medication. This medication may  make you feel generally unwell. This is not uncommon, as chemotherapy affects healthy cells as well as cancer cells. Report any side effects. Continue your course of treatment even though you feel ill unless your care team tells you to stop. This medication may increase your risk of getting an infection. Call your care team for advice if you get a fever, chills, sore throat, or other symptoms of a cold or flu. Do not treat yourself. Try to avoid being around people who are sick. Avoid taking medications that contain aspirin, acetaminophen, ibuprofen, naproxen, or ketoprofen unless instructed by your care team. These medications can hide a fever. Talk to your care team if you may be pregnant. Serious birth defects can occur if you take this medication during pregnancy and for 7 months after the last dose. You will need a negative pregnancy test before starting this medication. Contraception is recommended while taking this medication and for 7 months after the last dose. Your care team can help you find the option that works for you. Do not breastfeed while taking this medication and for 7 months after stopping treatment. What side effects may I notice from receiving this medication? Side effects that you should report to your care team as soon as possible: Allergic reactions or angioedema--skin rash, itching or hives, swelling of the face, eyes, lips, tongue, arms, or legs, trouble swallowing or breathing Dry cough, shortness of breath or trouble breathing Heart failure--shortness of breath, swelling of the ankles, feet, or hands, sudden weight gain, unusual weakness or fatigue Infection--fever, chills, cough, or sore throat Infusion reactions--chest pain, shortness of breath or trouble breathing, feeling faint or   lightheaded Side effects that usually do not require medical attention (report to your care team if they continue or are bothersome): Diarrhea Dizziness Headache Nausea Trouble  sleeping Vomiting This list may not describe all possible side effects. Call your doctor for medical advice about side effects. You may report side effects to FDA at 1-800-FDA-1088. Where should I keep my medication? This medication is given in a hospital or clinic. It will not be stored at home. NOTE: This sheet is a summary. It may not cover all possible information. If you have questions about this medicine, talk to your doctor, pharmacist, or health care provider.  2023 Elsevier/Gold Standard (2021-07-16 00:00:00)  

## 2022-02-19 ENCOUNTER — Inpatient Hospital Stay: Payer: Commercial Managed Care - PPO

## 2022-02-22 ENCOUNTER — Encounter: Payer: Self-pay | Admitting: *Deleted

## 2022-03-03 ENCOUNTER — Other Ambulatory Visit: Payer: Self-pay | Admitting: Hematology and Oncology

## 2022-03-04 NOTE — Progress Notes (Signed)
Patient Care Team: Group, [place] as PCP - Philomena Doheny, Paulette Blanch, RN as Oncology Nurse Navigator Rockwell Germany, RN as Oncology Nurse Navigator Nicholas Lose, MD as Consulting Physician (Hematology and Oncology) Coralie Keens, MD as Consulting Physician (General Surgery) Eppie Gibson, MD as Attending Physician (Radiation Oncology)  DIAGNOSIS:  Encounter Diagnosis  Name Primary?   Malignant neoplasm of upper-outer quadrant of right breast in female, estrogen receptor negative ([place]) Yes    SUMMARY OF ONCOLOGIC HISTORY: Oncology History  Malignant neoplasm of upper-outer quadrant of right breast in female, estrogen receptor negative ([place])  04/08/2021 Initial Diagnosis   History of right breast eczematous change to the nipple, patient had palpable lump in the right breast her mammogram 2.1 cm spiculated mass with extensive calcifications that span 11 cm.  Ultrasound revealed 3 cm mass at 10:00 plus small satellite lesions.  Biopsy revealed grade 3 IDC ER 0%, PR 0%, HER2 3+ positive, Ki-67 30%   04/08/2021 Cancer Staging   Staging form: Breast, AJCC 8th Edition - Clinical stage from 04/08/2021: Stage IIB (cT2, cN1, cM0, G3, ER-, PR-, HER2+) - Signed by Nicholas Lose, MD on 04/15/2021 Stage prefix: Initial diagnosis Method of lymph node assessment: Axillary lymph node dissection Histologic grading system: 3 grade system   05/01/2021 - 11/26/2021 Chemotherapy   Patient is on Treatment Plan : BREAST  Docetaxel + Carboplatin + Trastuzumab + Pertuzumab  (TCHP) q21d      05/21/2021 Genetic Testing   Negative hereditary cancer genetic testing: no pathogenic variants detected in [facility] CustomNext-Cancer +RNAinsight Panel.  Report date is 05/21/2021.   The CustomNext-Cancer+RNAinsight panel offered by [facility] includes sequencing and rearrangement analysis for the following 47 genes:  APC, ATM, AXIN2, BARD1, BMPR1A, BRCA1, BRCA2, BRIP1, CDH1, CDK4, CDKN2A, CHEK2, DICER1, EPCAM,  GREM1, HOXB13, MEN1, MLH1, MSH2, MSH3, MSH6, MUTYH, NBN, NF1, NF2, NTHL1, PALB2, PMS2, POLD1, POLE, PTEN, RAD51C, RAD51D, RECQL, RET, SDHA, SDHAF2, SDHB, SDHC, SDHD, SMAD4, SMARCA4, STK11, TP53, TSC1, TSC2, and VHL.  RNA data is routinely analyzed for use in variant interpretation for all genes.   09/04/2021 -  Chemotherapy   Patient is on Treatment Plan : BREAST Trastuzumab  + Pertuzumab q21d x 13 cycles       CHIEF COMPLIANT: Follow-up on Herceptin Perjeta   INTERVAL HISTORY: Semaj Kahliyah Dick is a 31 y.o with the above mention currently on Herceptin Perjeta. She presents to the clinic for a follow-up. She reports that she still is not having a normal stool.  Daily 1 episode of loose stools. She reports energy level is good.    ALLERGIES:  has No Known Allergies.  MEDICATIONS:  Current Outpatient Medications  Medication Sig Dispense Refill   Vitamin D, Ergocalciferol, (DRISDOL) 1.25 MG (50000 UNIT) CAPS capsule Take 50,000 Units by mouth every 7 (seven) days.     XARELTO 20 MG TABS tablet TAKE 1 TABLET BY MOUTH DAILY WITH SUPPER. 30 tablet 6   No current facility-administered medications for this visit.   Facility-Administered Medications Ordered in Other Visits  Medication Dose Route Frequency Provider Last Rate Last Admin   heparin lock flush 100 unit/mL  500 Units Intracatheter Once PRN Nicholas Lose, MD       pertuzumab (PERJETA) 420 mg in sodium chloride 0.9 % 250 mL chemo infusion  420 mg Intravenous Once Nicholas Lose, MD       sodium chloride flush (NS) 0.9 % injection 10 mL  10 mL Intracatheter PRN Nicholas Lose, MD  trastuzumab-anns (KANJINTI) 420 mg in sodium chloride 0.9 % 250 mL chemo infusion  6 mg/kg (Treatment Plan Recorded) Intravenous Once Nicholas Lose, MD 540 mL/hr at 03/08/22 1304 420 mg at 03/08/22 1304    PHYSICAL EXAMINATION: ECOG PERFORMANCE STATUS: 1 - Symptomatic but completely ambulatory  Vitals:   03/08/22 1141  BP: 139/85  Pulse: 76  Resp:  18  Temp: 97.8 F (36.6 C)  SpO2: 100%   Filed Weights   03/08/22 1141  Weight: 158 lb 3.2 oz (71.8 kg)      LABORATORY DATA:  I have reviewed the data as listed    Latest Ref Rng & Units 03/08/2022   11:26 AM 01/28/2022   10:36 AM 12/17/2021    9:30 AM  CMP  Glucose 70 - 99 mg/dL 89  80  79   BUN 6 - 20 mg/dL _0 Creatinine 0.44 - 1.00 mg/dL 0.83  1.08  1.01   Sodium 135 - 145 mmol/L 137  138  140   Potassium 3.5 - 5.1 mmol/L 4.0  4.0  3.9   Chloride 98 - 111 mmol/L 106  106  106   CO2 22 - 32 mmol/L _1 Calcium 8.9 - 10.3 mg/dL 9.1  9.1  9.4   Total Protein 6.5 - 8.1 g/dL 6.9  7.3  7.2   Total Bilirubin 0.3 - 1.2 mg/dL 0.6  0.6  0.7   Alkaline Phos 38 - 126 U/L 94  82  82   AST 15 - 41 U/L _2 ALT 0 - 44 U/L _3 Lab Results  Component Value Date   WBC 6.6 03/08/2022   HGB 12.6 03/08/2022   HCT 35.6 (L) 03/08/2022   MCV 90.8 03/08/2022   PLT 180 03/08/2022   NEUTROABS 5.1 03/08/2022    ASSESSMENT & PLAN:  Malignant neoplasm of upper-outer quadrant of right breast in female, estrogen receptor negative ([place]) 04/08/2021:History of right breast eczematous change to the nipple, patient had palpable lump in the right breast her mammogram 2.1 cm spiculated mass with extensive calcifications that span 11 cm.  Ultrasound revealed 3 cm mass at 10:00 plus small satellite lesions.  Biopsy revealed grade 3 IDC ER 0%, PR 0%, HER2 3+ positive, Ki-67 30%   Recommendation: 1. Neoadjuvant chemotherapy with TCH Perjeta 6 cycles completed 08/12/2021 followed by Herceptin Perjeta maintenance for 1 year 2. right mastectomy with reconstruction 10/13/2021: No residual malignancy complete pathologic response 0/5 lymph nodes negative 3. Followed by adjuvant radiation therapy to complete 01/11/2022 ----------------------------------------------------------------------------------------------------------------------------------------------- Treatment plan:  Herceptin and Perjeta maintenance and completed 04/21/2021 Toxicities: 1.  Acneform rash on the face:  She used topical clindamycin.  It resolved her acne. 2. intermittent diarrhea: Not bothering her.  We will request for port removal along with her breast reconstruction surgery with Dr. Iran Planas. Return to clinic every 3 weeks for Herceptin Perjeta and every 6 weeks for follow-up with me.    Orders Placed This Encounter  Procedures   Vitamin D 25 hydroxy    Standing Status:   Future    Number of Occurrences:   1    Standing Expiration Date:   03/08/2023   The patient has a good understanding of the overall plan. she agrees with it. she will call with any problems that may develop before the next visit here. Total time spent: 30 mins including face to  face time and time spent for planning, charting and co-ordination of care   Harriette Ohara, MD 03/08/22    I Gardiner Coins am acting as a Education administrator for Dr.Silvino Selman  I have reviewed the above documentation for accuracy and completeness, and I agree with the above.

## 2022-03-05 ENCOUNTER — Other Ambulatory Visit: Payer: Self-pay | Admitting: *Deleted

## 2022-03-05 DIAGNOSIS — Z171 Estrogen receptor negative status [ER-]: Secondary | ICD-10-CM

## 2022-03-08 ENCOUNTER — Other Ambulatory Visit: Payer: Self-pay

## 2022-03-08 ENCOUNTER — Inpatient Hospital Stay: Payer: Commercial Managed Care - PPO | Attending: Hematology and Oncology | Admitting: Hematology and Oncology

## 2022-03-08 ENCOUNTER — Other Ambulatory Visit: Payer: Self-pay | Admitting: Surgery

## 2022-03-08 ENCOUNTER — Inpatient Hospital Stay: Payer: Commercial Managed Care - PPO

## 2022-03-08 VITALS — BP 139/85 | HR 76 | Temp 97.8°F | Resp 18 | Ht 69.0 in | Wt 158.2 lb

## 2022-03-08 VITALS — BP 126/72 | HR 68 | Resp 16

## 2022-03-08 DIAGNOSIS — Z5111 Encounter for antineoplastic chemotherapy: Secondary | ICD-10-CM | POA: Insufficient documentation

## 2022-03-08 DIAGNOSIS — Z171 Estrogen receptor negative status [ER-]: Secondary | ICD-10-CM

## 2022-03-08 DIAGNOSIS — C50411 Malignant neoplasm of upper-outer quadrant of right female breast: Secondary | ICD-10-CM | POA: Diagnosis present

## 2022-03-08 DIAGNOSIS — Z79899 Other long term (current) drug therapy: Secondary | ICD-10-CM | POA: Insufficient documentation

## 2022-03-08 DIAGNOSIS — Z95828 Presence of other vascular implants and grafts: Secondary | ICD-10-CM

## 2022-03-08 LAB — CMP (CANCER CENTER ONLY)
ALT: 15 U/L (ref 0–44)
AST: 16 U/L (ref 15–41)
Albumin: 4.2 g/dL (ref 3.5–5.0)
Alkaline Phosphatase: 94 U/L (ref 38–126)
Anion gap: 7 (ref 5–15)
BUN: 17 mg/dL (ref 6–20)
CO2: 24 mmol/L (ref 22–32)
Calcium: 9.1 mg/dL (ref 8.9–10.3)
Chloride: 106 mmol/L (ref 98–111)
Creatinine: 0.83 mg/dL (ref 0.44–1.00)
GFR, Estimated: >60 mL/min (ref >60)
Glucose, Bld: 89 mg/dL (ref 70–99)
Potassium: 4 mmol/L (ref 3.5–5.1)
Sodium: 137 mmol/L (ref 135–145)
Total Bilirubin: 0.6 mg/dL (ref 0.3–1.2)
Total Protein: 6.9 g/dL (ref 6.5–8.1)

## 2022-03-08 LAB — CBC WITH DIFFERENTIAL (CANCER CENTER ONLY)
Abs Immature Granulocytes: 0.03 10*3/uL (ref 0.00–0.07)
Basophils Absolute: 0 10*3/uL (ref 0.0–0.1)
Basophils Relative: 1 %
Eosinophils Absolute: 0.1 10*3/uL (ref 0.0–0.5)
Eosinophils Relative: 1 %
HCT: 35.6 % — ABNORMAL LOW (ref 36.0–46.0)
Hemoglobin: 12.6 g/dL (ref 12.0–15.0)
Immature Granulocytes: 1 %
Lymphocytes Relative: 16 %
Lymphs Abs: 1.1 10*3/uL (ref 0.7–4.0)
MCH: 32.1 pg (ref 26.0–34.0)
MCHC: 35.4 g/dL (ref 30.0–36.0)
MCV: 90.8 fL (ref 80.0–100.0)
Monocytes Absolute: 0.3 10*3/uL (ref 0.1–1.0)
Monocytes Relative: 5 %
Neutro Abs: 5.1 10*3/uL (ref 1.7–7.7)
Neutrophils Relative %: 76 %
Platelet Count: 180 10*3/uL (ref 150–400)
RBC: 3.92 MIL/uL (ref 3.87–5.11)
RDW: 12.8 % (ref 11.5–15.5)
WBC Count: 6.6 10*3/uL (ref 4.0–10.5)
nRBC: 0 % (ref 0.0–0.2)

## 2022-03-08 LAB — VITAMIN D 25 HYDROXY (VIT D DEFICIENCY, FRACTURES): Vit D, 25-Hydroxy: 43.11 ng/mL (ref 30–100)

## 2022-03-08 MED ORDER — HEPARIN SOD (PORK) LOCK FLUSH 100 UNIT/ML IV SOLN
500.0000 [IU] | Freq: Once | INTRAVENOUS | Status: AC | PRN
  Administered 2022-03-08: 500 [IU]

## 2022-03-08 MED ORDER — SODIUM CHLORIDE 0.9 % IV SOLN: Freq: Once | INTRAVENOUS | Status: AC

## 2022-03-08 MED ORDER — DIPHENHYDRAMINE HCL 25 MG PO CAPS
50.0000 mg | ORAL_CAPSULE | Freq: Once | ORAL | Status: AC
  Administered 2022-03-08: 50 mg via ORAL
  Filled 2022-03-08: qty 2

## 2022-03-08 MED ORDER — TRASTUZUMAB-ANNS CHEMO 150 MG IV SOLR
6.0000 mg/kg | Freq: Once | INTRAVENOUS | Status: AC
  Administered 2022-03-08: 420 mg via INTRAVENOUS
  Filled 2022-03-08: qty 20

## 2022-03-08 MED ORDER — SODIUM CHLORIDE 0.9% FLUSH
10.0000 mL | INTRAVENOUS | Status: DC | PRN
  Administered 2022-03-08: 10 mL

## 2022-03-08 MED ORDER — SODIUM CHLORIDE 0.9% FLUSH
10.0000 mL | Freq: Once | INTRAVENOUS | Status: AC
  Administered 2022-03-08: 10 mL

## 2022-03-08 MED ORDER — SODIUM CHLORIDE 0.9 % IV SOLN
420.0000 mg | Freq: Once | INTRAVENOUS | Status: AC
  Administered 2022-03-08: 420 mg via INTRAVENOUS
  Filled 2022-03-08: qty 14

## 2022-03-08 MED ORDER — ACETAMINOPHEN 325 MG PO TABS
650.0000 mg | ORAL_TABLET | Freq: Once | ORAL | Status: AC
  Administered 2022-03-08: 650 mg via ORAL
  Filled 2022-03-08: qty 2

## 2022-03-08 NOTE — Patient Instructions (Signed)
[facility] ONCOLOGY  Discharge Instructions: Thank you for choosing [facility] to provide your oncology and hematology care.   If you have a lab appointment with the [facility], please go directly to the [facility] and check in at the registration area.   Wear comfortable clothing and clothing appropriate for easy access to any Portacath or PICC line.   We strive to give you quality time with your provider. You may need to reschedule your appointment if you arrive late (15 or more minutes).  Arriving late affects you and other patients whose appointments are after yours.  Also, if you miss three or more appointments without notifying the office, you may be dismissed from the clinic at the provider's discretion.      For prescription refill requests, have your pharmacy contact our office and allow 72 hours for refills to be completed.    Today you received the following chemotherapy and/or immunotherapy agents: Kanjinti/Perjeta      To help prevent nausea and vomiting after your treatment, we encourage you to take your nausea medication as directed.  BELOW ARE SYMPTOMS THAT SHOULD BE REPORTED IMMEDIATELY: *FEVER GREATER THAN 100.4 F (38 C) OR HIGHER *CHILLS OR SWEATING *NAUSEA AND VOMITING THAT IS NOT CONTROLLED WITH YOUR NAUSEA MEDICATION *UNUSUAL SHORTNESS OF BREATH *UNUSUAL BRUISING OR BLEEDING *URINARY PROBLEMS (pain or burning when urinating, or frequent urination) *BOWEL PROBLEMS (unusual diarrhea, constipation, pain near the anus) TENDERNESS IN MOUTH AND THROAT WITH OR WITHOUT PRESENCE OF ULCERS (sore throat, sores in mouth, or a toothache) UNUSUAL RASH, SWELLING OR PAIN  UNUSUAL VAGINAL DISCHARGE OR ITCHING   Items with * indicate a potential emergency and should be followed up as soon as possible or go to the Emergency Department if any problems should occur.  Please show the CHEMOTHERAPY ALERT CARD or IMMUNOTHERAPY ALERT CARD at  check-in to the Emergency Department and triage nurse.  Should you have questions after your visit or need to cancel or reschedule your appointment, please contact [facility]  Dept: [phone]  and follow the prompts.  Office hours are 8:00 a.m. to 4:30 p.m. Monday - Friday. Please note that voicemails left after 4:00 p.m. may not be returned until the following business day.  We are closed weekends and major holidays. You have access to a nurse at all times for urgent questions. Please call the main number to the clinic Dept: [phone] and follow the prompts.   For any non-urgent questions, you may also contact your provider using MyChart. We now offer e-Visits for anyone 71 and older to request care online for non-urgent symptoms. For details visit [email or web address].   Also download the MyChart app! Go to the app store, search "MyChart", open the app, select [facility], and log in with your MyChart username and password.  Masks are optional in the cancer centers. If you would like for your care team to wear a mask while they are taking care of you, please let them know. You may have one support person who is at least 32 years old accompany you for your appointments.

## 2022-03-08 NOTE — Assessment & Plan Note (Addendum)
04/08/2021:History of right breast eczematous change to the nipple, patient had palpable lump in the right breast her mammogram 2.1 cm spiculated mass with extensive calcifications that span 11 cm.  Ultrasound revealed 3 cm mass at 10:00 plus small satellite lesions.  Biopsy revealed grade 3 IDC ER 0%, PR 0%, HER2 3+ positive, Ki-67 30%   Recommendation: 1. Neoadjuvant chemotherapy with TCH Perjeta 6 cycles completed 08/12/2021 followed by Herceptin Perjeta maintenance for 1 year 2. right mastectomy with reconstruction 10/13/2021: No residual malignancy complete pathologic response 0/5 lymph nodes negative 3. Followed by adjuvant radiation therapy to complete 01/11/2022 ----------------------------------------------------------------------------------------------------------------------------------------------- Treatment plan: Herceptin and Perjeta maintenance to complete 04/21/2021 Toxicities: 1.  Acneform rash on the face:  She used topical clindamycin.  It resolved her acne. 2. intermittent diarrhea: Not bothering her.    Return to clinic every 3 weeks for Herceptin Perjeta and every 6 weeks for follow-up with me.

## 2022-03-09 ENCOUNTER — Telehealth: Payer: Self-pay | Admitting: Hematology and Oncology

## 2022-03-09 NOTE — Telephone Encounter (Signed)
Scheduled appointment per 12/11 los. Patient is aware.

## 2022-03-10 ENCOUNTER — Other Ambulatory Visit: Payer: Commercial Managed Care - PPO

## 2022-03-10 ENCOUNTER — Ambulatory Visit: Payer: Commercial Managed Care - PPO

## 2022-03-10 ENCOUNTER — Ambulatory Visit: Payer: Commercial Managed Care - PPO | Admitting: Hematology and Oncology

## 2022-03-31 ENCOUNTER — Other Ambulatory Visit: Payer: Self-pay

## 2022-03-31 ENCOUNTER — Encounter: Payer: Self-pay | Admitting: *Deleted

## 2022-03-31 ENCOUNTER — Ambulatory Visit: Payer: Commercial Managed Care - PPO

## 2022-03-31 ENCOUNTER — Inpatient Hospital Stay: Payer: Commercial Managed Care - PPO | Attending: Hematology and Oncology

## 2022-03-31 VITALS — BP 113/68 | HR 76 | Temp 98.1°F | Resp 16 | Wt 161.8 lb

## 2022-03-31 DIAGNOSIS — Z171 Estrogen receptor negative status [ER-]: Secondary | ICD-10-CM | POA: Diagnosis not present

## 2022-03-31 DIAGNOSIS — Z5111 Encounter for antineoplastic chemotherapy: Secondary | ICD-10-CM | POA: Diagnosis present

## 2022-03-31 DIAGNOSIS — C50411 Malignant neoplasm of upper-outer quadrant of right female breast: Secondary | ICD-10-CM | POA: Diagnosis not present

## 2022-03-31 DIAGNOSIS — Z79899 Other long term (current) drug therapy: Secondary | ICD-10-CM | POA: Diagnosis not present

## 2022-03-31 MED ORDER — DIPHENHYDRAMINE HCL 25 MG PO CAPS
50.0000 mg | ORAL_CAPSULE | Freq: Once | ORAL | Status: AC
  Administered 2022-03-31: 50 mg via ORAL
  Filled 2022-03-31: qty 2

## 2022-03-31 MED ORDER — HEPARIN SOD (PORK) LOCK FLUSH 100 UNIT/ML IV SOLN
500.0000 [IU] | Freq: Once | INTRAVENOUS | Status: AC | PRN
  Administered 2022-03-31: 500 [IU]

## 2022-03-31 MED ORDER — TRASTUZUMAB-ANNS CHEMO 150 MG IV SOLR
6.0000 mg/kg | Freq: Once | INTRAVENOUS | Status: AC
  Administered 2022-03-31: 420 mg via INTRAVENOUS
  Filled 2022-03-31: qty 20

## 2022-03-31 MED ORDER — SODIUM CHLORIDE 0.9% FLUSH
10.0000 mL | INTRAVENOUS | Status: DC | PRN
  Administered 2022-03-31: 10 mL

## 2022-03-31 MED ORDER — SODIUM CHLORIDE 0.9 % IV SOLN
420.0000 mg | Freq: Once | INTRAVENOUS | Status: AC
  Administered 2022-03-31: 420 mg via INTRAVENOUS
  Filled 2022-03-31: qty 14

## 2022-03-31 MED ORDER — ACETAMINOPHEN 325 MG PO TABS
650.0000 mg | ORAL_TABLET | Freq: Once | ORAL | Status: AC
  Administered 2022-03-31: 650 mg via ORAL
  Filled 2022-03-31: qty 2

## 2022-03-31 MED ORDER — SODIUM CHLORIDE 0.9 % IV SOLN: Freq: Once | INTRAVENOUS | Status: AC

## 2022-03-31 NOTE — Patient Instructions (Signed)
[facility] ONCOLOGY  Discharge Instructions: Thank you for choosing [facility] to provide your oncology and hematology care.   If you have a lab appointment with the [facility], please go directly to the [facility] and check in at the registration area.   Wear comfortable clothing and clothing appropriate for easy access to any Portacath or PICC line.   We strive to give you quality time with your provider. You may need to reschedule your appointment if you arrive late (15 or more minutes).  Arriving late affects you and other patients whose appointments are after yours.  Also, if you miss three or more appointments without notifying the office, you may be dismissed from the clinic at the provider's discretion.      For prescription refill requests, have your pharmacy contact our office and allow 72 hours for refills to be completed.    Today you received the following chemotherapy and/or immunotherapy agents :  Trastuzumab & Pertuzumab.      To help prevent nausea and vomiting after your treatment, we encourage you to take your nausea medication as directed.  BELOW ARE SYMPTOMS THAT SHOULD BE REPORTED IMMEDIATELY: *FEVER GREATER THAN 100.4 F (38 C) OR HIGHER *CHILLS OR SWEATING *NAUSEA AND VOMITING THAT IS NOT CONTROLLED WITH YOUR NAUSEA MEDICATION *UNUSUAL SHORTNESS OF BREATH *UNUSUAL BRUISING OR BLEEDING *URINARY PROBLEMS (pain or burning when urinating, or frequent urination) *BOWEL PROBLEMS (unusual diarrhea, constipation, pain near the anus) TENDERNESS IN MOUTH AND THROAT WITH OR WITHOUT PRESENCE OF ULCERS (sore throat, sores in mouth, or a toothache) UNUSUAL RASH, SWELLING OR PAIN  UNUSUAL VAGINAL DISCHARGE OR ITCHING   Items with * indicate a potential emergency and should be followed up as soon as possible or go to the Emergency Department if any problems should occur.  Please show the CHEMOTHERAPY ALERT CARD or IMMUNOTHERAPY ALERT  CARD at check-in to the Emergency Department and triage nurse.  Should you have questions after your visit or need to cancel or reschedule your appointment, please contact [facility]  Dept: [phone]  and follow the prompts.  Office hours are 8:00 a.m. to 4:30 p.m. Monday - Friday. Please note that voicemails left after 4:00 p.m. may not be returned until the following business day.  We are closed weekends and major holidays. You have access to a nurse at all times for urgent questions. Please call the main number to the clinic Dept: [phone] and follow the prompts.   For any non-urgent questions, you may also contact your provider using MyChart. We now offer e-Visits for anyone 45 and older to request care online for non-urgent symptoms. For details visit [email or web address].   Also download the MyChart app! Go to the app store, search "MyChart", open the app, select [facility], and log in with your MyChart username and password.

## 2022-04-15 ENCOUNTER — Other Ambulatory Visit: Payer: Self-pay | Admitting: *Deleted

## 2022-04-15 ENCOUNTER — Encounter: Payer: Self-pay | Admitting: Hematology and Oncology

## 2022-04-15 DIAGNOSIS — Z171 Estrogen receptor negative status [ER-]: Secondary | ICD-10-CM

## 2022-04-15 DIAGNOSIS — C50411 Malignant neoplasm of upper-outer quadrant of right female breast: Secondary | ICD-10-CM

## 2022-04-16 ENCOUNTER — Encounter: Payer: Self-pay | Admitting: Hematology and Oncology

## 2022-04-16 ENCOUNTER — Other Ambulatory Visit: Payer: Self-pay | Admitting: *Deleted

## 2022-04-16 DIAGNOSIS — C50411 Malignant neoplasm of upper-outer quadrant of right female breast: Secondary | ICD-10-CM

## 2022-04-20 NOTE — Assessment & Plan Note (Signed)
04/08/2021:History of right breast eczematous change to the nipple, patient had palpable lump in the right breast her mammogram 2.1 cm spiculated mass with extensive calcifications that span 11 cm.  Ultrasound revealed 3 cm mass at 10:00 plus small satellite lesions.  Biopsy revealed grade 3 IDC ER 0%, PR 0%, HER2 3+ positive, Ki-67 30%   Recommendation: 1. Neoadjuvant chemotherapy with TCH Perjeta 6 cycles completed 08/12/2021 followed by Herceptin Perjeta maintenance for 1 year 2. right mastectomy with reconstruction 10/13/2021: No residual malignancy complete pathologic response 0/5 lymph nodes negative 3. Followed by adjuvant radiation therapy to complete 01/11/2022 ----------------------------------------------------------------------------------------------------------------------------------------------- Treatment plan: Herceptin and Perjeta maintenance and completed 04/21/2021 Toxicities: 1.  Acneform rash on the face:  She used topical clindamycin.  It resolved her acne. 2. intermittent diarrhea: Not bothering her.   We will request for port removal along with her breast reconstruction surgery with Dr. Iran Planas. Return to clinic every 3 weeks for Herceptin Perjeta and every 6 weeks for follow-up with me.

## 2022-04-21 ENCOUNTER — Inpatient Hospital Stay: Payer: Commercial Managed Care - PPO

## 2022-04-21 ENCOUNTER — Other Ambulatory Visit: Payer: Self-pay

## 2022-04-21 ENCOUNTER — Inpatient Hospital Stay (HOSPITAL_BASED_OUTPATIENT_CLINIC_OR_DEPARTMENT_OTHER): Payer: Commercial Managed Care - PPO | Admitting: Hematology and Oncology

## 2022-04-21 VITALS — BP 119/75 | HR 58 | Temp 97.9°F | Resp 18 | Ht 69.0 in | Wt 160.4 lb

## 2022-04-21 DIAGNOSIS — Z95828 Presence of other vascular implants and grafts: Secondary | ICD-10-CM

## 2022-04-21 DIAGNOSIS — Z171 Estrogen receptor negative status [ER-]: Secondary | ICD-10-CM | POA: Diagnosis not present

## 2022-04-21 DIAGNOSIS — C50411 Malignant neoplasm of upper-outer quadrant of right female breast: Secondary | ICD-10-CM

## 2022-04-21 LAB — CBC WITH DIFFERENTIAL (CANCER CENTER ONLY)
Abs Immature Granulocytes: 0.01 10*3/uL (ref 0.00–0.07)
Basophils Absolute: 0 10*3/uL (ref 0.0–0.1)
Basophils Relative: 0 %
Eosinophils Absolute: 0.1 10*3/uL (ref 0.0–0.5)
Eosinophils Relative: 1 %
HCT: 34.7 % — ABNORMAL LOW (ref 36.0–46.0)
Hemoglobin: 12 g/dL (ref 12.0–15.0)
Immature Granulocytes: 0 %
Lymphocytes Relative: 23 %
Lymphs Abs: 1.4 10*3/uL (ref 0.7–4.0)
MCH: 31.2 pg (ref 26.0–34.0)
MCHC: 34.6 g/dL (ref 30.0–36.0)
MCV: 90.1 fL (ref 80.0–100.0)
Monocytes Absolute: 0.3 10*3/uL (ref 0.1–1.0)
Monocytes Relative: 6 %
Neutro Abs: 4.1 10*3/uL (ref 1.7–7.7)
Neutrophils Relative %: 70 %
Platelet Count: 213 10*3/uL (ref 150–400)
RBC: 3.85 MIL/uL — ABNORMAL LOW (ref 3.87–5.11)
RDW: 11.9 % (ref 11.5–15.5)
WBC Count: 5.9 10*3/uL (ref 4.0–10.5)
nRBC: 0 % (ref 0.0–0.2)

## 2022-04-21 LAB — CMP (CANCER CENTER ONLY)
ALT: 16 U/L (ref 0–44)
AST: 19 U/L (ref 15–41)
Albumin: 4 g/dL (ref 3.5–5.0)
Alkaline Phosphatase: 80 U/L (ref 38–126)
Anion gap: 5 (ref 5–15)
BUN: 22 mg/dL — ABNORMAL HIGH (ref 6–20)
CO2: 27 mmol/L (ref 22–32)
Calcium: 9.1 mg/dL (ref 8.9–10.3)
Chloride: 105 mmol/L (ref 98–111)
Creatinine: 0.95 mg/dL (ref 0.44–1.00)
GFR, Estimated: >60 mL/min (ref >60)
Glucose, Bld: 78 mg/dL (ref 70–99)
Potassium: 4.1 mmol/L (ref 3.5–5.1)
Sodium: 137 mmol/L (ref 135–145)
Total Bilirubin: 0.5 mg/dL (ref 0.3–1.2)
Total Protein: 7.1 g/dL (ref 6.5–8.1)

## 2022-04-21 MED ORDER — SODIUM CHLORIDE 0.9% FLUSH
10.0000 mL | Freq: Once | INTRAVENOUS | Status: AC
  Administered 2022-04-21: 10 mL

## 2022-04-21 MED ORDER — SODIUM CHLORIDE 0.9 % IV SOLN: Freq: Once | INTRAVENOUS | Status: AC

## 2022-04-21 MED ORDER — SODIUM CHLORIDE 0.9% FLUSH
10.0000 mL | INTRAVENOUS | Status: DC | PRN
  Administered 2022-04-21: 10 mL

## 2022-04-21 MED ORDER — TRASTUZUMAB-ANNS CHEMO 150 MG IV SOLR
6.0000 mg/kg | Freq: Once | INTRAVENOUS | Status: AC
  Administered 2022-04-21: 420 mg via INTRAVENOUS
  Filled 2022-04-21: qty 20

## 2022-04-21 MED ORDER — HEPARIN SOD (PORK) LOCK FLUSH 100 UNIT/ML IV SOLN
500.0000 [IU] | Freq: Once | INTRAVENOUS | Status: AC | PRN
  Administered 2022-04-21: 500 [IU]

## 2022-04-21 MED ORDER — ACETAMINOPHEN 325 MG PO TABS
650.0000 mg | ORAL_TABLET | Freq: Once | ORAL | Status: AC
  Administered 2022-04-21: 650 mg via ORAL
  Filled 2022-04-21: qty 2

## 2022-04-21 MED ORDER — SODIUM CHLORIDE 0.9 % IV SOLN
420.0000 mg | Freq: Once | INTRAVENOUS | Status: AC
  Administered 2022-04-21: 420 mg via INTRAVENOUS
  Filled 2022-04-21: qty 14

## 2022-04-21 MED ORDER — DIPHENHYDRAMINE HCL 25 MG PO CAPS
50.0000 mg | ORAL_CAPSULE | Freq: Once | ORAL | Status: AC
  Administered 2022-04-21: 50 mg via ORAL
  Filled 2022-04-21: qty 2

## 2022-04-21 NOTE — Progress Notes (Signed)
Patient Care Team: Group, [place] as PCP - Philomena Doheny, Paulette Blanch, RN as Oncology Nurse Navigator Rockwell Germany, RN as Oncology Nurse Navigator Nicholas Lose, MD as Consulting Physician (Hematology and Oncology) Coralie Keens, MD as Consulting Physician (General Surgery) Eppie Gibson, MD as Attending Physician (Radiation Oncology)  DIAGNOSIS:  Encounter Diagnosis  Name Primary?   Malignant neoplasm of upper-outer quadrant of right breast in female, estrogen receptor negative ([place]) Yes    SUMMARY OF ONCOLOGIC HISTORY: Oncology History  Malignant neoplasm of upper-outer quadrant of right breast in female, estrogen receptor negative ([place])  04/08/2021 Initial Diagnosis   History of right breast eczematous change to the nipple, patient had palpable lump in the right breast her mammogram 2.1 cm spiculated mass with extensive calcifications that span 11 cm.  Ultrasound revealed 3 cm mass at 10:00 plus small satellite lesions.  Biopsy revealed grade 3 IDC ER 0%, PR 0%, HER2 3+ positive, Ki-67 30%   04/08/2021 Cancer Staging   Staging form: Breast, AJCC 8th Edition - Clinical stage from 04/08/2021: Stage IIB (cT2, cN1, cM0, G3, ER-, PR-, HER2+) - Signed by Nicholas Lose, MD on 04/15/2021 Stage prefix: Initial diagnosis Method of lymph node assessment: Axillary lymph node dissection Histologic grading system: 3 grade system   05/01/2021 - 11/26/2021 Chemotherapy   Patient is on Treatment Plan : BREAST  Docetaxel + Carboplatin + Trastuzumab + Pertuzumab  (TCHP) q21d      05/21/2021 Genetic Testing   Negative hereditary cancer genetic testing: no pathogenic variants detected in [facility] CustomNext-Cancer +RNAinsight Panel.  Report date is 05/21/2021.   The CustomNext-Cancer+RNAinsight panel offered by [facility] includes sequencing and rearrangement analysis for the following 47 genes:  APC, ATM, AXIN2, BARD1, BMPR1A, BRCA1, BRCA2, BRIP1, CDH1, CDK4, CDKN2A, CHEK2, DICER1, EPCAM,  GREM1, HOXB13, MEN1, MLH1, MSH2, MSH3, MSH6, MUTYH, NBN, NF1, NF2, NTHL1, PALB2, PMS2, POLD1, POLE, PTEN, RAD51C, RAD51D, RECQL, RET, SDHA, SDHAF2, SDHB, SDHC, SDHD, SMAD4, SMARCA4, STK11, TP53, TSC1, TSC2, and VHL.  RNA data is routinely analyzed for use in variant interpretation for all genes.   09/04/2021 -  Chemotherapy   Patient is on Treatment Plan : BREAST Trastuzumab  + Pertuzumab q21d x 13 cycles       CHIEF COMPLIANT: Follow-up on Herceptin Perjeta     INTERVAL HISTORY: Recie Rima Blizzard is a 32 y.o with the above mention currently on Herceptin Perjeta. She presents to the clinic for last treatment of Herceptin Perjeta. She states that she feels pain and pressure when she gets her port flush. She reports that the Gi is still not normal. But she has recovered from the fatigue. She says she started doing crossfit. She say it is very exited and fun, very inspiring. She complains of still itching of the neck and chest.   ALLERGIES:  has No Known Allergies.  MEDICATIONS:  Current Outpatient Medications  Medication Sig Dispense Refill   Cholecalciferol (VITAMIN D-1000 MAX ST) 25 MCG (1000 UT) tablet Take by mouth.     Lactobacillus Rhamnosus, GG, (CULTURELLE) CAPS Take 1 capsule by mouth daily.     Multiple Vitamin (MULTI-VITAMIN) tablet Take 1 tablet by mouth daily.     Vitamin D, Ergocalciferol, (DRISDOL) 1.25 MG (50000 UNIT) CAPS capsule Take 50,000 Units by mouth every 7 (seven) days.     XARELTO 20 MG TABS tablet TAKE 1 TABLET BY MOUTH DAILY WITH SUPPER. 30 tablet 6   No current facility-administered medications for this visit.    PHYSICAL EXAMINATION: ECOG  PERFORMANCE STATUS: 1 - Symptomatic but completely ambulatory  Vitals:   04/21/22 1120  BP: 119/75  Pulse: (!) 58  Resp: 18  Temp: 97.9 F (36.6 C)  SpO2: 100%   Filed Weights   04/21/22 1120  Weight: 160 lb 6.4 oz (72.8 kg)     LABORATORY DATA:  I have reviewed the data as listed    Latest Ref Rng & Units  03/08/2022   11:26 AM 01/28/2022   10:36 AM 12/17/2021    9:30 AM  CMP  Glucose 70 - 99 mg/dL 89  80  79   BUN 6 - 20 mg/dL '17  20  21   '$ Creatinine 0.44 - 1.00 mg/dL 0.83  1.08  1.01   Sodium 135 - 145 mmol/L 137  138  140   Potassium 3.5 - 5.1 mmol/L 4.0  4.0  3.9   Chloride 98 - 111 mmol/L 106  106  106   CO2 22 - 32 mmol/L '24  27  28   '$ Calcium 8.9 - 10.3 mg/dL 9.1  9.1  9.4   Total Protein 6.5 - 8.1 g/dL 6.9  7.3  7.2   Total Bilirubin 0.3 - 1.2 mg/dL 0.6  0.6  0.7   Alkaline Phos 38 - 126 U/L 94  82  82   AST 15 - 41 U/L '16  21  18   '$ ALT 0 - 44 U/L '15  19  16     '$ Lab Results  Component Value Date   WBC 5.9 04/21/2022   HGB 12.0 04/21/2022   HCT 34.7 (L) 04/21/2022   MCV 90.1 04/21/2022   PLT 213 04/21/2022   NEUTROABS 4.1 04/21/2022    ASSESSMENT & PLAN:  Malignant neoplasm of upper-outer quadrant of right breast in female, estrogen receptor negative ([facility]) 04/08/2021:History of right breast eczematous change to the nipple, patient had palpable lump in the right breast her mammogram 2.1 cm spiculated mass with extensive calcifications that span 11 cm.  Ultrasound revealed 3 cm mass at 10:00 plus small satellite lesions.  Biopsy revealed grade 3 IDC ER 0%, PR 0%, HER2 3+ positive, Ki-67 30%   Recommendation: 1. Neoadjuvant chemotherapy with TCH Perjeta 6 cycles completed 08/12/2021 followed by Herceptin Perjeta maintenance for 1 year 2. right mastectomy with reconstruction 10/13/2021: No residual malignancy complete pathologic response 0/5 lymph nodes negative 3. Followed by adjuvant radiation therapy to complete 01/11/2022 ----------------------------------------------------------------------------------------------------------------------------------------------- Treatment plan: Herceptin and Perjeta maintenance and completed 04/21/2021 Toxicities: 1.  Rash on the chest and back: I discussed with her that it could be a fixed drug eruption from Herceptin and Perjeta.  Since  this is her last treatment she should start to see some improvement afterwards.. 2. intermittent diarrhea: Not bothering her.   We will request for port removal along with her breast reconstruction surgery with Dr. Iran Planas. [facility] is [facility] for [facility] opinion for plastic surgery.  She will continue Xarelto until the port is in place.  She can discontinue it 48 hours prior to surgery. We do not need to continue Xarelto after the port has been removed.  Return to clinic in 3 months for survivorship care plan was    No orders of the defined types were placed in this encounter.  The patient has a good understanding of the overall plan. she agrees with it. she will call with any problems that may develop before the next visit here. Total time spent: 30 mins including face to face time and time  spent for planning, charting and co-ordination of care   Harriette Ohara, MD 04/21/22    I Gardiner Coins am acting as a Education administrator for Dr.Zykiria Bruening  I have reviewed the above documentation for accuracy and completeness, and I agree with the above.

## 2022-04-22 ENCOUNTER — Encounter: Payer: Self-pay | Admitting: *Deleted

## 2022-04-22 DIAGNOSIS — Z171 Estrogen receptor negative status [ER-]: Secondary | ICD-10-CM

## 2022-04-22 LAB — MISC LABCORP TEST (SEND OUT): Labcorp test code: 81950

## 2022-04-27 ENCOUNTER — Encounter: Payer: Self-pay | Admitting: Hematology and Oncology

## 2022-05-05 ENCOUNTER — Other Ambulatory Visit: Payer: Self-pay | Admitting: Surgery

## 2022-05-05 ENCOUNTER — Encounter: Payer: Self-pay | Admitting: Hematology and Oncology

## 2022-05-06 ENCOUNTER — Telehealth: Payer: Self-pay | Admitting: Hematology and Oncology

## 2022-05-06 NOTE — Telephone Encounter (Signed)
Scheduled appointment per 12/4 los. Patient is aware. 

## 2022-05-07 ENCOUNTER — Other Ambulatory Visit: Payer: Self-pay

## 2022-05-09 ENCOUNTER — Encounter: Payer: Self-pay | Admitting: Hematology and Oncology

## 2022-05-10 ENCOUNTER — Ambulatory Visit: Payer: Commercial Managed Care - PPO | Attending: Surgery

## 2022-05-10 ENCOUNTER — Other Ambulatory Visit: Payer: Self-pay

## 2022-05-10 VITALS — Wt 159.0 lb

## 2022-05-10 DIAGNOSIS — Z483 Aftercare following surgery for neoplasm: Secondary | ICD-10-CM | POA: Insufficient documentation

## 2022-05-10 MED ORDER — RIVAROXABAN 20 MG PO TABS: 20.0000 mg | ORAL_TABLET | Freq: Every day | ORAL | 6 refills | Status: DC

## 2022-05-10 NOTE — Therapy (Signed)
OUTPATIENT PHYSICAL THERAPY SOZO SCREENING NOTE   Patient Name: Tara White MRN: [id number] DOB:Sep 08, 1990, 32 y.o., female Today's Date: 05/10/2022  PCP: [facility] REFERRING PROVIDER: Coralie Keens, MD   PT End of Session - 05/10/22 1615     Visit Number 3   # unchanged due to screen only   PT Start Time 20    PT Stop Time 1617    PT Time Calculation (min) 5 min    Activity Tolerance Patient tolerated treatment well    Behavior During Therapy [facility] for tasks assessed/performed             Past Medical History:  Diagnosis Date   DVT (deep venous thrombosis) ([place]) 06/25/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   Family history of breast cancer 04/21/2021   right breast ca 03/2021   Past Surgical History:  Procedure Laterality Date   BREAST RECONSTRUCTION WITH PLACEMENT OF TISSUE EXPANDER AND ALLODERM Right 10/13/2021   Procedure: RIGHT BREAST RECONSTRUCTION WITH PLACEMENT OF TISSUE EXPANDER AND ALLODERM;  Surgeon: Irene Limbo, MD;  Location: [place];  Service: Plastics;  Laterality: Right;   CESAREAN SECTION N/A 12/10/2015   Procedure: CESAREAN SECTION;  Surgeon: Aloha Gell, MD;  Location: [place];  Service: Obstetrics;  Laterality: N/A;   IR CV LINE INJECTION  06/25/2021   IR RADIOLOGY PERIPHERAL GUIDED IV START  06/25/2021   IR US GUIDE VASC ACCESS LEFT  06/25/2021   IR VENO/EXT/UNI LEFT  06/25/2021   NODE DISSECTION Right 10/13/2021   Procedure: TARGETED LYMPH NODE DISSECTION;  Surgeon: Coralie Keens, MD;  Location: [place];  Service: General;  Laterality: Right;   PORTACATH PLACEMENT Left 04/30/2021   Procedure: INSERTION PORT-A-CATH;  Surgeon: Coralie Keens, MD;  Location: [place];  Service: General;  Laterality: Left;   SIMPLE MASTECTOMY WITH AXILLARY SENTINEL NODE BIOPSY Right 10/13/2021   Procedure: RIGHT MASTECTOMY;  Surgeon: Coralie Keens, MD;  Location: [place];  Service: General;  Laterality: Right;   [place] EXTRACTION     Patient Active Problem List   Diagnosis Date Noted   Breast cancer, right ([place]) 10/13/2021   Port-A-Cath in place 07/17/2021   Family history of breast cancer 04/21/2021   Genetic testing 04/21/2021   Malignant neoplasm of upper-outer quadrant of right breast in female, estrogen receptor negative ([place]) 04/14/2021    REFERRING DIAG: right breast cancer at risk for lymphedema  THERAPY DIAG:  Aftercare following surgery for neoplasm  PERTINENT HISTORY: Patient was diagnosed on 04/08/2021 with right grade III invasive ductal carcinoma breast cancer. It measures 3 cm with 10.8 cm of calcifications and is located in the upper outer quadrant. It is ER/PR negative and HER2 positive with a Ki67 of 30%. She has a biopsied positive axillary lymph node. 10/13/21- R mastectomy and SLNB (0/5)with reconstruction and expander placed   PRECAUTIONS: right UE Lymphedema risk, None  SUBJECTIVE: Pt returns for her first 3 month L-Dex screen.   PAIN:  Are you having pain? No  SOZO SCREENING: Patient was assessed today using the SOZO machine to determine the lymphedema index score. This was compared to her baseline score. It was determined that she is within the recommended range when compared to her baseline and no further action is needed at this time. She will continue SOZO screenings. These are done every 3 months for 2 years post operatively followed by every 6 months for 2 years, and then annually.   L-DEX FLOWSHEETS -  05/10/22 1600       L-DEX LYMPHEDEMA SCREENING   Measurement Type Unilateral    L-DEX MEASUREMENT EXTREMITY Upper Extremity    POSITION  Standing    DOMINANT SIDE Right    At Risk Side Right    BASELINE SCORE (UNILATERAL) -7.4    L-DEX SCORE (UNILATERAL) -10.1    VALUE CHANGE (UNILAT) -2.7               Otelia Limes, PTA 05/10/2022, 4:17 PM

## 2022-05-13 ENCOUNTER — Encounter: Payer: Self-pay | Admitting: Hematology and Oncology

## 2022-05-20 ENCOUNTER — Other Ambulatory Visit: Payer: Self-pay

## 2022-05-20 ENCOUNTER — Encounter (HOSPITAL_BASED_OUTPATIENT_CLINIC_OR_DEPARTMENT_OTHER): Payer: Self-pay | Admitting: Surgery

## 2022-05-24 NOTE — Progress Notes (Signed)

## 2022-05-26 NOTE — H&P (Signed)
Tara White is an 32 y.o. female.   Chief Complaint: History of breast cancer, Port-A-Cath no longer needed HPI: This is a 32 year old female is completed her treatment for breast cancer.  She no longer needs her Port-A-Cath.  Removal of the port is been requested.  She is doing well and has no complaints.  Past Medical History:  Diagnosis Date   DVT (deep venous thrombosis) ([place]) 06/25/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   Family history of breast cancer 04/21/2021   right breast ca 03/2021    Past Surgical History:  Procedure Laterality Date   BREAST RECONSTRUCTION WITH PLACEMENT OF TISSUE EXPANDER AND ALLODERM Right 10/13/2021   Procedure: RIGHT BREAST RECONSTRUCTION WITH PLACEMENT OF TISSUE EXPANDER AND ALLODERM;  Surgeon: Irene Limbo, MD;  Location: [place];  Service: Plastics;  Laterality: Right;   CESAREAN SECTION N/A 12/10/2015   Procedure: CESAREAN SECTION;  Surgeon: Aloha Gell, MD;  Location: [place];  Service: Obstetrics;  Laterality: N/A;   IR CV LINE INJECTION  06/25/2021   IR RADIOLOGY PERIPHERAL GUIDED IV START  06/25/2021   IR US GUIDE VASC ACCESS LEFT  06/25/2021   IR VENO/EXT/UNI LEFT  06/25/2021   NODE DISSECTION Right 10/13/2021   Procedure: TARGETED LYMPH NODE DISSECTION;  Surgeon: Coralie Keens, MD;  Location: [place];  Service: General;  Laterality: Right;   PORTACATH PLACEMENT Left 04/30/2021   Procedure: INSERTION PORT-A-CATH;  Surgeon: Coralie Keens, MD;  Location: [place];  Service: General;  Laterality: Left;   SIMPLE MASTECTOMY WITH AXILLARY SENTINEL NODE BIOPSY Right 10/13/2021   Procedure: RIGHT MASTECTOMY;  Surgeon: Coralie Keens, MD;  Location: [place];  Service: General;  Laterality: Right;   WISDOM TOOTH EXTRACTION      Family History  Problem Relation Age of Onset   Hypothyroidism Mother    Breast cancer Maternal Grandmother 6   Skin cancer Maternal  Grandfather    Heart failure Paternal Grandfather    Social History:  reports that she has never smoked. She has never used smokeless tobacco. She reports that she does not currently use alcohol after a past usage of about 2.0 standard drinks of alcohol per week. She reports that she does not use drugs.  Allergies: No Known Allergies  No medications prior to admission.    No results found for this or any previous visit (from the past 48 hour(s)). No results found.  Review of Systems  All other systems reviewed and are negative.   Height '5\' 9"'$  (1.753 m), weight 72.1 kg, unknown if currently breastfeeding. Physical Exam Constitutional:      Appearance: Normal appearance.  HENT:     Head: Normocephalic and atraumatic.  Cardiovascular:     Rate and Rhythm: Normal rate and regular rhythm.  Pulmonary:     Effort: Pulmonary effort is normal. No respiratory distress.  Musculoskeletal:     Cervical back: Normal range of motion.  Skin:    General: Skin is warm and dry.  Neurological:     General: No focal deficit present.     Mental Status: She is alert.  Psychiatric:        Behavior: Behavior normal.      Assessment/Plan Port-A-Cath no longer needed, history of breast cancer  We will now proceed to the operating room for Port-A-Cath removal.  We discussed the procedure in detail including the risks.  Surgery is scheduled.  She agrees with the plans  Coralie Keens, MD 05/26/2022,  9:28 AM

## 2022-05-27 ENCOUNTER — Ambulatory Visit (HOSPITAL_BASED_OUTPATIENT_CLINIC_OR_DEPARTMENT_OTHER): Payer: Commercial Managed Care - PPO | Admitting: Certified Registered"

## 2022-05-27 ENCOUNTER — Ambulatory Visit (HOSPITAL_BASED_OUTPATIENT_CLINIC_OR_DEPARTMENT_OTHER)
Admission: RE | Admit: 2022-05-27 | Discharge: 2022-05-27 | Disposition: A | Discharge status: Home / Self Care | Payer: Commercial Managed Care - PPO | Attending: Surgery | Admitting: Surgery

## 2022-05-27 ENCOUNTER — Encounter (HOSPITAL_BASED_OUTPATIENT_CLINIC_OR_DEPARTMENT_OTHER): Payer: Self-pay | Admitting: Surgery

## 2022-05-27 ENCOUNTER — Encounter (HOSPITAL_BASED_OUTPATIENT_CLINIC_OR_DEPARTMENT_OTHER)
Admission: RE | Disposition: A | Discharge status: Home / Self Care | Payer: Self-pay | Source: Home / Self Care | Attending: Surgery

## 2022-05-27 DIAGNOSIS — Z452 Encounter for adjustment and management of vascular access device: Secondary | ICD-10-CM

## 2022-05-27 DIAGNOSIS — Z01818 Encounter for other preprocedural examination: Secondary | ICD-10-CM

## 2022-05-27 DIAGNOSIS — Z853 Personal history of malignant neoplasm of breast: Secondary | ICD-10-CM | POA: Diagnosis not present

## 2022-05-27 DIAGNOSIS — Z86718 Personal history of other venous thrombosis and embolism: Secondary | ICD-10-CM | POA: Diagnosis not present

## 2022-05-27 DIAGNOSIS — I82409 Acute embolism and thrombosis of unspecified deep veins of unspecified lower extremity: Secondary | ICD-10-CM

## 2022-05-27 HISTORY — PX: PORT-A-CATH REMOVAL: SHX5289

## 2022-05-27 LAB — POCT PREGNANCY, URINE: Preg Test, Ur: NEGATIVE

## 2022-05-27 SURGERY — REMOVAL PORT-A-CATH
Anesthesia: Monitor Anesthesia Care | Site: Chest | Laterality: Left

## 2022-05-27 MED ORDER — LIDOCAINE 2% (20 MG/ML) 5 ML SYRINGE
INTRAMUSCULAR | Status: AC
  Filled 2022-05-27: qty 15

## 2022-05-27 MED ORDER — PROPOFOL 500 MG/50ML IV EMUL
INTRAVENOUS | Status: AC
  Filled 2022-05-27: qty 50

## 2022-05-27 MED ORDER — OXYCODONE HCL 5 MG PO TABS: 5.0000 mg | ORAL_TABLET | Freq: Once | ORAL | Status: DC | PRN

## 2022-05-27 MED ORDER — MIDAZOLAM HCL 5 MG/5ML IJ SOLN
INTRAMUSCULAR | Status: DC | PRN
  Administered 2022-05-27 (×2): 1 mg via INTRAVENOUS

## 2022-05-27 MED ORDER — LACTATED RINGERS IV SOLN: INTRAVENOUS | Status: DC

## 2022-05-27 MED ORDER — PHENYLEPHRINE 80 MCG/ML (10ML) SYRINGE FOR IV PUSH (FOR BLOOD PRESSURE SUPPORT)
PREFILLED_SYRINGE | INTRAVENOUS | Status: AC
  Filled 2022-05-27: qty 30

## 2022-05-27 MED ORDER — GLYCOPYRROLATE PF 0.2 MG/ML IJ SOSY
PREFILLED_SYRINGE | INTRAMUSCULAR | Status: AC
  Filled 2022-05-27: qty 1

## 2022-05-27 MED ORDER — LIDOCAINE HCL (CARDIAC) PF 100 MG/5ML IV SOSY
PREFILLED_SYRINGE | INTRAVENOUS | Status: DC | PRN
  Administered 2022-05-27: 50 mg via INTRAVENOUS

## 2022-05-27 MED ORDER — FENTANYL CITRATE (PF) 100 MCG/2ML IJ SOLN
INTRAMUSCULAR | Status: AC
  Filled 2022-05-27: qty 2

## 2022-05-27 MED ORDER — MIDAZOLAM HCL 2 MG/2ML IJ SOLN
INTRAMUSCULAR | Status: AC
  Filled 2022-05-27: qty 2

## 2022-05-27 MED ORDER — ACETAMINOPHEN 500 MG PO TABS
1000.0000 mg | ORAL_TABLET | ORAL | Status: AC
  Administered 2022-05-27: 1000 mg via ORAL

## 2022-05-27 MED ORDER — CHLORHEXIDINE GLUCONATE CLOTH 2 % EX PADS: 6.0000 | MEDICATED_PAD | Freq: Once | CUTANEOUS | Status: DC

## 2022-05-27 MED ORDER — PROPOFOL 500 MG/50ML IV EMUL
INTRAVENOUS | Status: DC | PRN
  Administered 2022-05-27: 125 ug/kg/min via INTRAVENOUS

## 2022-05-27 MED ORDER — CEFAZOLIN SODIUM-DEXTROSE 2-4 GM/100ML-% IV SOLN
INTRAVENOUS | Status: AC
  Filled 2022-05-27: qty 100

## 2022-05-27 MED ORDER — ONDANSETRON HCL 4 MG/2ML IJ SOLN
INTRAMUSCULAR | Status: AC
  Filled 2022-05-27: qty 2

## 2022-05-27 MED ORDER — PROPOFOL 10 MG/ML IV BOLUS
INTRAVENOUS | Status: AC
  Filled 2022-05-27: qty 20

## 2022-05-27 MED ORDER — EPHEDRINE 5 MG/ML INJ
INTRAVENOUS | Status: AC
  Filled 2022-05-27: qty 20

## 2022-05-27 MED ORDER — FENTANYL CITRATE (PF) 100 MCG/2ML IJ SOLN
INTRAMUSCULAR | Status: DC | PRN
  Administered 2022-05-27 (×2): 50 ug via INTRAVENOUS

## 2022-05-27 MED ORDER — CEFAZOLIN SODIUM-DEXTROSE 2-4 GM/100ML-% IV SOLN
2.0000 g | INTRAVENOUS | Status: AC
  Administered 2022-05-27: 2 g via INTRAVENOUS

## 2022-05-27 MED ORDER — ACETAMINOPHEN 500 MG PO TABS
ORAL_TABLET | ORAL | Status: AC
  Filled 2022-05-27: qty 2

## 2022-05-27 MED ORDER — ONDANSETRON HCL 4 MG/2ML IJ SOLN
INTRAMUSCULAR | Status: DC | PRN
  Administered 2022-05-27: 4 mg via INTRAVENOUS

## 2022-05-27 MED ORDER — LIDOCAINE 2% (20 MG/ML) 5 ML SYRINGE
INTRAMUSCULAR | Status: AC
  Filled 2022-05-27: qty 5

## 2022-05-27 MED ORDER — TRAMADOL HCL 50 MG PO TABS: 50.0000 mg | ORAL_TABLET | Freq: Four times a day (QID) | ORAL | 0 refills | Status: DC | PRN

## 2022-05-27 MED ORDER — OXYCODONE HCL 5 MG/5ML PO SOLN: 5.0000 mg | Freq: Once | ORAL | Status: DC | PRN

## 2022-05-27 MED ORDER — PROPOFOL 10 MG/ML IV BOLUS
INTRAVENOUS | Status: DC | PRN
  Administered 2022-05-27: 20 mg via INTRAVENOUS

## 2022-05-27 MED ORDER — FENTANYL CITRATE (PF) 100 MCG/2ML IJ SOLN: 25.0000 ug | INTRAMUSCULAR | Status: DC | PRN

## 2022-05-27 MED ORDER — ROCURONIUM BROMIDE 10 MG/ML (PF) SYRINGE
PREFILLED_SYRINGE | INTRAVENOUS | Status: AC
  Filled 2022-05-27: qty 10

## 2022-05-27 MED ORDER — LIDOCAINE HCL (PF) 1 % IJ SOLN
INTRAMUSCULAR | Status: DC | PRN
  Administered 2022-05-27: 4 mL

## 2022-05-27 MED ORDER — LIDOCAINE HCL (PF) 1 % IJ SOLN
INTRAMUSCULAR | Status: AC
  Filled 2022-05-27: qty 60

## 2022-05-27 MED ORDER — PROMETHAZINE HCL 25 MG/ML IJ SOLN: 6.2500 mg | INTRAMUSCULAR | Status: DC | PRN

## 2022-05-27 SURGICAL SUPPLY — 30 items
ADH SKN CLS APL DERMABOND .7 (GAUZE/BANDAGES/DRESSINGS) ×1
APL PRP STRL LF DISP 70% ISPRP (MISCELLANEOUS) ×1
BLADE SURG 15 STRL LF DISP TIS (BLADE) ×1 IMPLANT
BLADE SURG 15 STRL SS (BLADE) ×1
CHLORAPREP W/TINT 26 (MISCELLANEOUS) ×1 IMPLANT
COVER BACK TABLE 60X90IN (DRAPES) ×1 IMPLANT
COVER MAYO STAND STRL (DRAPES) ×1 IMPLANT
DERMABOND ADVANCED .7 DNX12 (GAUZE/BANDAGES/DRESSINGS) ×1 IMPLANT
DRAPE LAPAROTOMY 100X72 PEDS (DRAPES) ×1 IMPLANT
DRAPE UTILITY XL STRL (DRAPES) ×1 IMPLANT
ELECT REM PT RETURN 9FT ADLT (ELECTROSURGICAL) ×1
ELECTRODE REM PT RTRN 9FT ADLT (ELECTROSURGICAL) ×1 IMPLANT
GLOVE SURG SIGNA 7.5 PF LTX (GLOVE) ×1 IMPLANT
GOWN STRL REUS W/ TWL LRG LVL3 (GOWN DISPOSABLE) ×1 IMPLANT
GOWN STRL REUS W/ TWL XL LVL3 (GOWN DISPOSABLE) ×1 IMPLANT
GOWN STRL REUS W/TWL LRG LVL3 (GOWN DISPOSABLE) ×1
GOWN STRL REUS W/TWL XL LVL3 (GOWN DISPOSABLE) ×1
NDL HYPO 25X1 1.5 SAFETY (NEEDLE) ×1 IMPLANT
NEEDLE HYPO 25X1 1.5 SAFETY (NEEDLE) ×1 IMPLANT
NS IRRIG 1000ML POUR BTL (IV SOLUTION) IMPLANT
PACK BASIN DAY SURGERY FS (CUSTOM PROCEDURE TRAY) ×1 IMPLANT
PENCIL SMOKE EVACUATOR (MISCELLANEOUS) ×1 IMPLANT
SLEEVE SCD COMPRESS KNEE MED (STOCKING) IMPLANT
SPIKE FLUID TRANSFER (MISCELLANEOUS) IMPLANT
SUT MNCRL AB 4-0 PS2 18 (SUTURE) ×1 IMPLANT
SUT VIC AB 3-0 SH 27 (SUTURE) ×1
SUT VIC AB 3-0 SH 27X BRD (SUTURE) ×1 IMPLANT
SYR BULB EAR ULCER 3OZ GRN STR (SYRINGE) IMPLANT
SYR CONTROL 10ML LL (SYRINGE) ×1 IMPLANT
TOWEL GREEN STERILE FF (TOWEL DISPOSABLE) ×1 IMPLANT

## 2022-05-27 NOTE — Anesthesia Postprocedure Evaluation (Signed)
Anesthesia Post Note  Patient: Tara White  Procedure(s) Performed: REMOVAL PORT-A-CATH (Left: Chest)     Patient location during evaluation: PACU Anesthesia Type: MAC Level of consciousness: awake and alert Pain management: pain level controlled Vital Signs Assessment: post-procedure vital signs reviewed and stable Respiratory status: spontaneous breathing, nonlabored ventilation and respiratory function stable Cardiovascular status: stable and blood pressure returned to baseline Anesthetic complications: no   No notable events documented.  Last Vitals:  Vitals:   05/27/22 1506 05/27/22 1525  BP:  118/73  Pulse: (!) 49 (!) 49  Resp: 16 16  Temp:  (!) 36.3 C  SpO2: 100% 95%    Last Pain:  Vitals:   05/27/22 1525  TempSrc:   PainSc: [place]

## 2022-05-27 NOTE — Anesthesia Preprocedure Evaluation (Addendum)
Anesthesia Evaluation  Patient identified by MRN, date of birth, ID band Patient awake    Reviewed: Allergy & Precautions, NPO status , Patient's Chart, lab work & pertinent test results  History of Anesthesia Complications Negative for: history of anesthetic complications  Airway Mallampati: I  TM Distance: >3 FB Neck ROM: Full    Dental  (+) Dental Advisory Given, Teeth Intact   Pulmonary neg pulmonary ROS   Pulmonary exam normal        Cardiovascular + DVT  Normal cardiovascular exam     Neuro/Psych negative neurological ROS  negative psych ROS   GI/Hepatic negative GI ROS, Neg liver ROS,,,  Endo/Other  negative endocrine ROS    Renal/GU negative Renal ROS     Musculoskeletal negative musculoskeletal ROS (+)    Abdominal   Peds  Hematology  On xarelto    Anesthesia Other Findings   Reproductive/Obstetrics  Breast cancer                              Anesthesia Physical Anesthesia Plan  ASA: 2  Anesthesia Plan: MAC   Post-op Pain Management: Tylenol PO (pre-op)*   Induction:   PONV Risk Score and Plan: 2 and Treatment may vary due to age or medical condition and Propofol infusion  Airway Management Planned: Natural Airway and Simple Face Mask  Additional Equipment: None  Intra-op Plan:   Post-operative Plan:   Informed Consent: I have reviewed the patients History and Physical, chart, labs and discussed the procedure including the risks, benefits and alternatives for the proposed anesthesia with the patient or authorized representative who has indicated his/her understanding and acceptance.       Plan Discussed with: CRNA and Anesthesiologist  Anesthesia Plan Comments:        Anesthesia Quick Evaluation

## 2022-05-27 NOTE — Interval H&P Note (Signed)
History and Physical Interval Note: no change in H and p  05/27/2022 2:11 PM  Tara White  has presented today for surgery, with the diagnosis of TREATMENT FOR BREAST CANCER COMPLETE.  The various methods of treatment have been discussed with the patient and family. After consideration of risks, benefits and other options for treatment, the patient has consented to  Procedure(s): REMOVAL PORT-A-CATH (N/A) as a surgical intervention.  The patient's history has been reviewed, patient examined, no change in status, stable for surgery.  I have reviewed the patient's chart and labs.  Questions were answered to the patient's satisfaction.     Coralie Keens

## 2022-05-27 NOTE — Discharge Instructions (Addendum)
Just behave   No vigorous activity for 1 week  May shower starting tomorrow  Ice pack, Tylenol, and ibuprofen also for pain   Post Anesthesia Home Care Instructions  Activity: Get plenty of rest for the remainder of the day. A responsible individual must stay with you for 24 hours following the procedure.  For the next 24 hours, DO NOT: -Drive a car -Paediatric nurse -Drink alcoholic beverages -Take any medication unless instructed by your physician -Make any legal decisions or sign important papers.  Meals: Start with liquid foods such as gelatin or soup. Progress to regular foods as tolerated. Avoid greasy, spicy, heavy foods. If nausea and/or vomiting occur, drink only clear liquids until the nausea and/or vomiting subsides. Call your physician if vomiting continues.  Special Instructions/Symptoms: Your throat may feel dry or sore from the anesthesia or the breathing tube placed in your throat during surgery. If this causes discomfort, gargle with warm salt water. The discomfort should disappear within 24 hours.  If you had a scopolamine patch placed behind your ear for the management of post- operative nausea and/or vomiting:  1. The medication in the patch is effective for 72 hours, after which it should be removed.  Wrap patch in a tissue and discard in the trash. Wash hands thoroughly with soap and water. 2. You may remove the patch earlier than 72 hours if you experience unpleasant side effects which may include dry mouth, dizziness or visual disturbances. 3. Avoid touching the patch. Wash your hands with soap and water after contact with the patch.  No tylenol until after 7:20pm if needed today.

## 2022-05-27 NOTE — Transfer of Care (Signed)
Immediate Anesthesia Transfer of Care Note  Patient: Annina Ropp Garms  Procedure(s) Performed: REMOVAL PORT-A-CATH (Left: Chest)  Patient Location: PACU  Anesthesia Type:MAC  Level of Consciousness: awake, alert , and oriented  Airway & Oxygen Therapy: Patient Spontanous Breathing and Patient connected to face mask oxygen  Post-op Assessment: Report given to RN and Post -op Vital signs reviewed and stable  Post vital signs: Reviewed and stable  Last Vitals:  Vitals Value Taken Time  BP 106/61 05/27/22 1448  Temp    Pulse 65 05/27/22 1449  Resp 19 05/27/22 1449  SpO2 100 % 05/27/22 1449  Vitals shown include unvalidated device data.  Last Pain:  Vitals:   05/27/22 1313  TempSrc: Oral  PainSc: 0-No pain      Patients Stated Pain Goal: 3 ([id number])  Complications: No notable events documented.

## 2022-05-27 NOTE — Op Note (Signed)
REMOVAL PORT-A-CATH  Procedure Note  Tara White 05/27/2022   Pre-op Diagnosis: TREATMENT FOR BREAST CANCER COMPLETE     Post-op Diagnosis: same  Procedure(s): REMOVAL PORT-A-CATH  Surgeon(s): Coralie Keens, MD  Anesthesia: Monitor Anesthesia Care  Staff:  Circulator: Anson Crofts, RN Scrub Person: Faythe Dingwall, RN  Estimated Blood Loss:                Indications: The patient has completed her treatment for breast cancer and her Port-A-Cath is no longer needed so we will proceed with removal of her left subclavian port  Procedure: The patient was brought to the operating room and identified as correct patient.  She was placed upon the operating room table and anesthesia was induced.  Her left chest was prepped and draped in the usual sterile fashion.  I anesthetized the skin at the scar of the Port-A-Cath site with lidocaine.  I then excised the old scar with a scalpel and then took the dissection down to the port with the cautery.  The port was easily identified and I removed both sutures holding in place.  I then removed the port and the catheter in its entirety.  I closed the catheter tract with a figure-of-eight 3-0 Vicryl suture.  I then closed the subcutaneous tissue with interrupted 3-0 Vicryl suture and closed the skin with a running 4-0 Monocryl.  Dermabond was then applied.  The patient tolerated the procedure well.  All the counts were correct at the end of the procedure.  The patient was then given in a stable condition from the operating room to the recovery room.          Coralie Keens   Date: 05/27/2022  Time: 2:47 PM

## 2022-05-28 ENCOUNTER — Encounter (HOSPITAL_BASED_OUTPATIENT_CLINIC_OR_DEPARTMENT_OTHER): Payer: Self-pay | Admitting: Surgery

## 2022-06-14 NOTE — H&P (Addendum)
ubjective:    Patient ID: Tara White is a 32 y.o. female.   HPI   8 mo post op right mastectomy with immediate expander based reconstruction. Since last visit has met with Dr. Dennison Bulla to discuss microsurgical options. She has decided to proceed with implant based reconstruction.    Presented right palpable right breast mass first noted during pregnancy and eczematous changes to nipple for over year duration. Diagnostic MMG/US showed right breast a 2.1 cm mass at 10:00, 4 cmfn, multiple small satellite masses, calcifications from within nipple to posterior depth spanning 10.8 cm, and an enlarged right axillary LN. Biopsy of mass showed IDC, ER/PR-, Her2+ with LN positive for metastatic disease   MRI demonstrated NME throughout all quadrants of the RIGHT breast, with largest confluent area of enhancement measuring 2.9 cm. 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 A 1.9 cm right axillary LN biopsy clip noted.   Staging scans negative.   Completed neoadjuvant chemotherapy. Final MRI demonstrated no abnormal enhancement identified in the right breast; normalization of metastatic right axillary LN. Given span of disease mastectomy recommended. Final pathology no residual malignancy, 0/6 SLN.   Completed Herceptin Perjeta 03/2022. Completed adjuvant radiation 10.16.23   Genetics negative. MGM with breast ca age 77.   Prior 36/8 A. With pregnancies was C cup. Goal B or C cup. Right mastectomy 208 g   MMG left 03/2022 normal   Developed left subclavian and left inniminate vein non occlusive thrombus 05/2021, completed Xarelto following removal port 04/2022.   Lives with spouse and 3 kids.   Review of Systems     Objective:  Physical Exam  Cardiovascular: normal heart sounds normal rhythm Pulmonary: clear to auscultation bilateral   Chest: right chest expanded, hyperpigmentation present, soft  Left chest with acne  present upper chest SN to nipple L 23.5 cm BW L 15 cm  Nipple to IMF  L 7 cm Soft tissue pinch left breast 4 cm   Assessment:    Right breast ca UOQ ER-, metastatic to LN Neoadjuvant chemotherapy S/p right SRM, SLN, prepectoral TE/ADM (Alloderm) reconstruction Adjuvant radiation   Plan:    Plan removal right chest tissue expander placement implant, left breast augmentation mastopexy.   Reviewed increased risks reconstruction in setting of RT including wound healing problems and capsular contracture. In setting implant based reconstruction, former can manifest at implant extrusion or exposure. Counseled autologous tissue may reduce risks reconstruction. Options include implant exchange alone, LD flap with implant exchange, or microsurgical options. Benefit of latter would be to not have implant related concerns such as rupture. We also discussed incidence of BIA ALCL and BIA SCC. Reviewed additional surgery related to implants common and implants are not meant to last lifetime. Patient has elected for implant exchange alone over right.   Reviewed saline vs silicone, smooth v textured, shaped v round.  As in prepectoral position I do recommend HCG or capacity filled silicone implants to reduce risk visible rippling.  Reviewed MRI or [place] surveillance for rupture with silicone implants. Reviewed examples for 4th generation, capacity filled 4th generation, and HCG implants vs saline implants. Reviewed risks AP flipping that may be more noticeable with 5th generation implants, may require surgery to correct. Discussed risk ALCL with textured devices. Patient has elected for silicone. Plan smooth round. Completed Herm[facility] physician patient checklist.   Recommend she consider left mastopexy augmentation. Counseled this is her choice but would be difficult to  offer her any symmetry within implant based reconstruction without procedure to opposite breast. Reviewed anchor type scar, soft tissue of  breast will descend over time over implant. Patient agrees to this. Counseled volume implants will be guided in part by width chest, and will be limited by mastectomy side.Discussed possible implant locations and plan subfascial position. Cannot assure her cup size. Sizing completed over left breast, she liked 300 ml.    Additional risks including but not limited to bleeding, hematoma, seroma, need for additional procedures, asymmetry, unacceptable cosmetic result, damage to adjacent structures, nipple areola necrosis, blood clots in legs or lungs reviewed.   Provided doxycyline oral and topical benzoyl peroxide/clindamycin gel to treat acne over left chest. Patient has pain medication and muscle relaxant at home as she did not need post mastectomy. Will provide additional antibiotic Rx on day of surgery.   Natrelle 133S FV-11 T 300 ml tissue expander placed  310 ml total fill volume saline

## 2022-06-17 NOTE — Progress Notes (Addendum)
PCP - Rowan Blase Cardiologist - denies Oncologist: Nicholas Lose  PPM/ICD - denies   Chest x-ray - n/a EKG - n/a Stress Test - denies ECHO - 02/08/22 Cardiac Cath - denies  CPAP - no   ERAS Protcol - yes, clear liquids until 4:30am  COVID TEST- not needed  Anesthesia review: no  Patient verbally denies any shortness of breath, fever, cough and chest pain during phone call   -------------  SDW INSTRUCTIONS given:  Your procedure is scheduled on Friday, 06/25/22.  Report to [facility] Main Entrance "A" at 5:30 A.M., and check in at the Admitting office.  Call this number if you have problems the morning of surgery:  [phone]   Remember:  Do not eat after midnight the night before your surgery  You may drink clear liquids until 4:30am the morning of your surgery.   Clear liquids allowed are: Water, Non-Citrus Juices (without pulp), Carbonated Beverages, Clear Tea, Black Coffee Only, and Gatorade    Take these medicines the morning of surgery with A SIP OF WATER: Doxycycline, eye drops  As of today, STOP taking any Aspirin (unless otherwise instructed by your surgeon) Aleve, Naproxen, Ibuprofen, Motrin, Advil, Goody's, BC's, all herbal medications, fish oil, and all vitamins.                      Do not wear jewelry, make up, or nail polish            Do not wear lotions, powders, perfumes/colognes, or deodorant.            Do not shave 48 hours prior to surgery.              Do not bring valuables to the hospital.            [facility] is not responsible for any belongings or valuables.  Do NOT Smoke (Tobacco/Vaping) 24 hours prior to your procedure If you use a CPAP at night, you may bring all equipment for your overnight stay.   Contacts, glasses, dentures or bridgework may not be worn into surgery.      For patients admitted to the hospital, discharge time will be determined by your treatment team.   Patients discharged the day of surgery will not be allowed  to drive home, and someone needs to stay with them for 24 hours.    Special instructions:   [place]- Preparing For Surgery  Before surgery, you can play an important role. Because skin is not sterile, your skin needs to be as free of germs as possible. You can reduce the number of germs on your skin by washing with CHG (chlorahexidine gluconate) Soap before surgery.  CHG is an antiseptic cleaner which kills germs and bonds with the skin to continue killing germs even after washing.    Oral Hygiene is also important to reduce your risk of infection.  Remember - BRUSH YOUR TEETH THE MORNING OF SURGERY WITH YOUR REGULAR TOOTHPASTE  Please do not use if you have an allergy to CHG or antibacterial soaps. If your skin becomes reddened/irritated stop using the CHG.  Do not shave (including legs and underarms) for at least 48 hours prior to first CHG shower. It is OK to shave your face.  Please follow these instructions carefully.   Shower the NIGHT BEFORE SURGERY and the MORNING OF SURGERY with DIAL Soap.   Pat yourself dry with a CLEAN TOWEL.  Wear CLEAN PAJAMAS to bed the  night before surgery  Place CLEAN SHEETS on your bed the night of your first shower and DO NOT SLEEP WITH PETS.   Day of Surgery: Please shower morning of surgery  Wear Clean/Comfortable clothing the morning of surgery Do not apply any deodorants/lotions.   Remember to brush your teeth WITH YOUR REGULAR TOOTHPASTE.   Questions were answered. Patient verbalized understanding of instructions.

## 2022-06-18 ENCOUNTER — Other Ambulatory Visit: Payer: Self-pay

## 2022-06-18 ENCOUNTER — Inpatient Hospital Stay (HOSPITAL_COMMUNITY)
Admission: RE | Admit: 2022-06-18 | Discharge: 2022-06-18 | Disposition: A | Discharge status: Home / Self Care | Payer: Commercial Managed Care - PPO | Source: Ambulatory Visit

## 2022-06-18 ENCOUNTER — Encounter (HOSPITAL_COMMUNITY): Payer: Self-pay

## 2022-06-24 NOTE — Anesthesia Preprocedure Evaluation (Addendum)
Anesthesia Evaluation  Patient identified by MRN, date of birth, ID band Patient awake    Reviewed: Allergy & Precautions, NPO status , Patient's Chart, lab work & pertinent test results  History of Anesthesia Complications Negative for: history of anesthetic complications  Airway Mallampati: I  TM Distance: >3 FB Neck ROM: Full    Dental  (+) Dental Advisory Given   Pulmonary neg pulmonary ROS   breath sounds clear to auscultation       Cardiovascular (-) angina + DVT   Rhythm:Regular Rate:Normal  01/2022 ECHO: EF 55-60%, LV has normal function, normal RVF, no significant valvular abnormalities   Neuro/Psych negative neurological ROS     GI/Hepatic negative GI ROS, Neg liver ROS,,,  Endo/Other  negative endocrine ROS    Renal/GU negative Renal ROS     Musculoskeletal   Abdominal   Peds  Hematology Xarelto   Anesthesia Other Findings Breast cancer  Reproductive/Obstetrics                             Anesthesia Physical Anesthesia Plan  ASA: 2  Anesthesia Plan: General   Post-op Pain Management: Tylenol PO (pre-op)*   Induction: Intravenous  PONV Risk Score and Plan: 3 and Ondansetron, Dexamethasone and Scopolamine patch - Pre-op  Airway Management Planned: LMA  Additional Equipment: None  Intra-op Plan:   Post-operative Plan:   Informed Consent: I have reviewed the patients History and Physical, chart, labs and discussed the procedure including the risks, benefits and alternatives for the proposed anesthesia with the patient or authorized representative who has indicated his/her understanding and acceptance.     Dental advisory given  Plan Discussed with: CRNA and Surgeon  Anesthesia Plan Comments:        Anesthesia Quick Evaluation

## 2022-06-25 ENCOUNTER — Ambulatory Visit (HOSPITAL_COMMUNITY): Payer: Commercial Managed Care - PPO | Admitting: Anesthesiology

## 2022-06-25 ENCOUNTER — Ambulatory Visit (HOSPITAL_BASED_OUTPATIENT_CLINIC_OR_DEPARTMENT_OTHER): Payer: Commercial Managed Care - PPO | Admitting: Anesthesiology

## 2022-06-25 ENCOUNTER — Encounter (HOSPITAL_COMMUNITY): Payer: Self-pay | Admitting: Plastic Surgery

## 2022-06-25 ENCOUNTER — Other Ambulatory Visit: Payer: Self-pay

## 2022-06-25 ENCOUNTER — Ambulatory Visit (HOSPITAL_COMMUNITY)
Admission: RE | Admit: 2022-06-25 | Discharge: 2022-06-25 | Disposition: A | Discharge status: Home / Self Care | Payer: Commercial Managed Care - PPO | Attending: Plastic Surgery | Admitting: Plastic Surgery

## 2022-06-25 ENCOUNTER — Encounter (HOSPITAL_COMMUNITY)
Admission: RE | Disposition: A | Discharge status: Home / Self Care | Payer: Self-pay | Source: Home / Self Care | Attending: Plastic Surgery

## 2022-06-25 DIAGNOSIS — Z9011 Acquired absence of right breast and nipple: Secondary | ICD-10-CM | POA: Insufficient documentation

## 2022-06-25 DIAGNOSIS — Z08 Encounter for follow-up examination after completed treatment for malignant neoplasm: Secondary | ICD-10-CM | POA: Diagnosis not present

## 2022-06-25 DIAGNOSIS — Z853 Personal history of malignant neoplasm of breast: Secondary | ICD-10-CM | POA: Diagnosis present

## 2022-06-25 DIAGNOSIS — Z803 Family history of malignant neoplasm of breast: Secondary | ICD-10-CM | POA: Diagnosis not present

## 2022-06-25 DIAGNOSIS — Z86718 Personal history of other venous thrombosis and embolism: Secondary | ICD-10-CM | POA: Insufficient documentation

## 2022-06-25 DIAGNOSIS — Z923 Personal history of irradiation: Secondary | ICD-10-CM | POA: Insufficient documentation

## 2022-06-25 DIAGNOSIS — Z45811 Encounter for adjustment or removal of right breast implant: Secondary | ICD-10-CM | POA: Diagnosis not present

## 2022-06-25 DIAGNOSIS — Z09 Encounter for follow-up examination after completed treatment for conditions other than malignant neoplasm: Secondary | ICD-10-CM | POA: Diagnosis not present

## 2022-06-25 DIAGNOSIS — Z01818 Encounter for other preprocedural examination: Secondary | ICD-10-CM

## 2022-06-25 HISTORY — PX: BREAST ENHANCEMENT SURGERY: SHX7

## 2022-06-25 HISTORY — PX: REMOVAL OF TISSUE EXPANDER AND PLACEMENT OF IMPLANT: SHX6457

## 2022-06-25 LAB — CBC
HCT: 38.7 % (ref 36.0–46.0)
Hemoglobin: 12.8 g/dL (ref 12.0–15.0)
MCH: 29.5 pg (ref 26.0–34.0)
MCHC: 33.1 g/dL (ref 30.0–36.0)
MCV: 89.2 fL (ref 80.0–100.0)
Platelets: 189 10*3/uL (ref 150–400)
RBC: 4.34 MIL/uL (ref 3.87–5.11)
RDW: 12.4 % (ref 11.5–15.5)
WBC: 5.4 10*3/uL (ref 4.0–10.5)
nRBC: 0 % (ref 0.0–0.2)

## 2022-06-25 LAB — POCT PREGNANCY, URINE: Preg Test, Ur: NEGATIVE

## 2022-06-25 SURGERY — REMOVAL, TISSUE EXPANDER, BREAST, WITH IMPLANT INSERTION
Anesthesia: General | Site: Chest | Laterality: Right

## 2022-06-25 MED ORDER — SCOPOLAMINE 1 MG/3DAYS TD PT72
1.0000 | MEDICATED_PATCH | TRANSDERMAL | Status: DC
  Filled 2022-06-25: qty 1

## 2022-06-25 MED ORDER — PROPOFOL 10 MG/ML IV BOLUS
INTRAVENOUS | Status: DC | PRN
  Administered 2022-06-25: 150 mg via INTRAVENOUS
  Administered 2022-06-25: 50 mg via INTRAVENOUS

## 2022-06-25 MED ORDER — FENTANYL CITRATE (PF) 250 MCG/5ML IJ SOLN
INTRAMUSCULAR | Status: DC | PRN
  Administered 2022-06-25: 100 ug via INTRAVENOUS
  Administered 2022-06-25 (×2): 25 ug via INTRAVENOUS

## 2022-06-25 MED ORDER — ONDANSETRON HCL 4 MG/2ML IJ SOLN
INTRAMUSCULAR | Status: DC | PRN
  Administered 2022-06-25: 4 mg via INTRAVENOUS

## 2022-06-25 MED ORDER — MIDAZOLAM HCL 2 MG/2ML IJ SOLN
INTRAMUSCULAR | Status: AC
  Filled 2022-06-25: qty 2

## 2022-06-25 MED ORDER — CHLORHEXIDINE GLUCONATE 0.12 % MT SOLN
15.0000 mL | Freq: Once | OROMUCOSAL | Status: AC
  Administered 2022-06-25: 15 mL via OROMUCOSAL
  Filled 2022-06-25: qty 15

## 2022-06-25 MED ORDER — LIDOCAINE 2% (20 MG/ML) 5 ML SYRINGE
INTRAMUSCULAR | Status: DC | PRN
  Administered 2022-06-25: 20 mg via INTRAVENOUS

## 2022-06-25 MED ORDER — CHLORHEXIDINE GLUCONATE CLOTH 2 % EX PADS: 6.0000 | MEDICATED_PAD | Freq: Once | CUTANEOUS | Status: DC

## 2022-06-25 MED ORDER — PHENYLEPHRINE 80 MCG/ML (10ML) SYRINGE FOR IV PUSH (FOR BLOOD PRESSURE SUPPORT)
PREFILLED_SYRINGE | INTRAVENOUS | Status: DC | PRN
  Administered 2022-06-25: 40 ug via INTRAVENOUS
  Administered 2022-06-25: 80 ug via INTRAVENOUS

## 2022-06-25 MED ORDER — GLYCOPYRROLATE 0.2 MG/ML IJ SOLN
INTRAMUSCULAR | Status: DC | PRN
  Administered 2022-06-25 (×2): .1 mg via INTRAVENOUS

## 2022-06-25 MED ORDER — HYDROMORPHONE HCL 1 MG/ML IJ SOLN: 0.2500 mg | INTRAMUSCULAR | Status: DC | PRN

## 2022-06-25 MED ORDER — BUPIVACAINE HCL 0.25 % IJ SOLN
INTRAMUSCULAR | Status: DC | PRN
  Administered 2022-06-25: 30 mL

## 2022-06-25 MED ORDER — CEFAZOLIN SODIUM-DEXTROSE 2-4 GM/100ML-% IV SOLN
2.0000 g | INTRAVENOUS | Status: AC
  Administered 2022-06-25: 2 g via INTRAVENOUS
  Filled 2022-06-25: qty 100

## 2022-06-25 MED ORDER — PROPOFOL 10 MG/ML IV BOLUS
INTRAVENOUS | Status: AC
  Filled 2022-06-25: qty 20

## 2022-06-25 MED ORDER — MIDAZOLAM HCL 2 MG/2ML IJ SOLN
INTRAMUSCULAR | Status: DC | PRN
  Administered 2022-06-25: 2 mg via INTRAVENOUS

## 2022-06-25 MED ORDER — ORAL CARE MOUTH RINSE: 15.0000 mL | Freq: Once | OROMUCOSAL | Status: AC

## 2022-06-25 MED ORDER — LACTATED RINGERS IV SOLN: INTRAVENOUS | Status: DC

## 2022-06-25 MED ORDER — OXYCODONE HCL 5 MG PO TABS: 5.0000 mg | ORAL_TABLET | Freq: Once | ORAL | Status: DC | PRN

## 2022-06-25 MED ORDER — PROMETHAZINE HCL 25 MG/ML IJ SOLN: 6.2500 mg | INTRAMUSCULAR | Status: DC | PRN

## 2022-06-25 MED ORDER — ACETAMINOPHEN 500 MG PO TABS: 1000.0000 mg | ORAL_TABLET | Freq: Once | ORAL | Status: DC

## 2022-06-25 MED ORDER — SULFAMETHOXAZOLE-TRIMETHOPRIM 800-160 MG PO TABS: 1.0000 | ORAL_TABLET | Freq: Two times a day (BID) | ORAL | 0 refills | Status: DC

## 2022-06-25 MED ORDER — ACETAMINOPHEN 500 MG PO TABS
1000.0000 mg | ORAL_TABLET | ORAL | Status: AC
  Administered 2022-06-25: 1000 mg via ORAL
  Filled 2022-06-25: qty 2

## 2022-06-25 MED ORDER — GABAPENTIN 300 MG PO CAPS
300.0000 mg | ORAL_CAPSULE | ORAL | Status: AC
  Administered 2022-06-25: 300 mg via ORAL
  Filled 2022-06-25: qty 1

## 2022-06-25 MED ORDER — SODIUM CHLORIDE 0.9 % IV SOLN
INTRAVENOUS | Status: DC | PRN
  Administered 2022-06-25: 512 mL

## 2022-06-25 MED ORDER — PHENYLEPHRINE HCL (PRESSORS) 10 MG/ML IV SOLN
INTRAVENOUS | Status: AC
  Filled 2022-06-25: qty 1

## 2022-06-25 MED ORDER — MIDAZOLAM HCL 2 MG/2ML IJ SOLN: 0.5000 mg | Freq: Once | INTRAMUSCULAR | Status: DC | PRN

## 2022-06-25 MED ORDER — DEXAMETHASONE SODIUM PHOSPHATE 10 MG/ML IJ SOLN
INTRAMUSCULAR | Status: DC | PRN
  Administered 2022-06-25: 5 mg via INTRAVENOUS

## 2022-06-25 MED ORDER — SODIUM CHLORIDE 0.9 % IV SOLN
INTRAVENOUS | Status: DC
  Filled 2022-06-25: qty 10

## 2022-06-25 MED ORDER — BUPIVACAINE HCL (PF) 0.25 % IJ SOLN
INTRAMUSCULAR | Status: AC
  Filled 2022-06-25: qty 30

## 2022-06-25 MED ORDER — EPHEDRINE SULFATE-NACL 50-0.9 MG/10ML-% IV SOSY
PREFILLED_SYRINGE | INTRAVENOUS | Status: DC | PRN
  Administered 2022-06-25: 10 mg via INTRAVENOUS
  Administered 2022-06-25: 5 mg via INTRAVENOUS

## 2022-06-25 MED ORDER — PHENYLEPHRINE HCL-NACL 20-0.9 MG/250ML-% IV SOLN
INTRAVENOUS | Status: DC | PRN
  Administered 2022-06-25: 30 ug/min via INTRAVENOUS

## 2022-06-25 MED ORDER — KETOROLAC TROMETHAMINE 30 MG/ML IJ SOLN
INTRAMUSCULAR | Status: AC
  Administered 2022-06-25: 30 mg
  Filled 2022-06-25: qty 1

## 2022-06-25 MED ORDER — MEPERIDINE HCL 25 MG/ML IJ SOLN: 6.2500 mg | INTRAMUSCULAR | Status: DC | PRN

## 2022-06-25 MED ORDER — CELECOXIB 200 MG PO CAPS
200.0000 mg | ORAL_CAPSULE | ORAL | Status: AC
  Administered 2022-06-25: 200 mg via ORAL
  Filled 2022-06-25: qty 1

## 2022-06-25 MED ORDER — OXYCODONE HCL 5 MG/5ML PO SOLN: 5.0000 mg | Freq: Once | ORAL | Status: DC | PRN

## 2022-06-25 MED ORDER — 0.9 % SODIUM CHLORIDE (POUR BTL) OPTIME
TOPICAL | Status: DC | PRN
  Administered 2022-06-25: 1000 mL

## 2022-06-25 MED ORDER — FENTANYL CITRATE (PF) 250 MCG/5ML IJ SOLN
INTRAMUSCULAR | Status: AC
  Filled 2022-06-25: qty 5

## 2022-06-25 MED ORDER — PROMETHAZINE HCL 25 MG/ML IJ SOLN
INTRAMUSCULAR | Status: AC
  Administered 2022-06-25: 6.25 mg via INTRAVENOUS
  Filled 2022-06-25: qty 1

## 2022-06-25 SURGICAL SUPPLY — 58 items
ADH SKN CLS APL DERMABOND .7 (GAUZE/BANDAGES/DRESSINGS) ×2
APL PRP STRL LF DISP 70% ISPRP (MISCELLANEOUS) ×2
BAG COUNTER SPONGE SURGICOUNT (BAG) ×2 IMPLANT
BAG DECANTER FOR FLEXI CONT (MISCELLANEOUS) ×2 IMPLANT
BAG SPNG CNTER NS LX DISP (BAG) ×2
BINDER BREAST LRG (GAUZE/BANDAGES/DRESSINGS) IMPLANT
CANISTER SUCT 3000ML PPV (MISCELLANEOUS) ×2 IMPLANT
CHLORAPREP W/TINT 26 (MISCELLANEOUS) ×2 IMPLANT
COVER SURGICAL LIGHT HANDLE (MISCELLANEOUS) ×2 IMPLANT
DERMABOND ADVANCED .7 DNX12 (GAUZE/BANDAGES/DRESSINGS) ×2 IMPLANT
DRAIN CHANNEL 19F RND (DRAIN) ×2 IMPLANT
DRAPE HALF SHEET 40X57 (DRAPES) ×4 IMPLANT
DRAPE TOP ARMCOVERS (MISCELLANEOUS) ×2 IMPLANT
DRAPE U-SHAPE 76X120 STRL (DRAPES) ×2 IMPLANT
ELECT COATED BLADE 2.86 ST (ELECTRODE) ×2 IMPLANT
ELECT REM PT RETURN 9FT ADLT (ELECTROSURGICAL) ×2
ELECTRODE REM PT RTRN 9FT ADLT (ELECTROSURGICAL) ×2 IMPLANT
EVACUATOR SILICONE 100CC (DRAIN) ×2 IMPLANT
GAUZE PAD ABD 8X10 STRL (GAUZE/BANDAGES/DRESSINGS) ×4 IMPLANT
GAUZE SPONGE 4X4 12PLY STRL (GAUZE/BANDAGES/DRESSINGS) ×2 IMPLANT
GLOVE BIO SURGEON STRL SZ 6 (GLOVE) ×2 IMPLANT
GLOVE SURG SS PI 6.0 STRL IVOR (GLOVE) ×2 IMPLANT
GOWN STRL REUS W/ TWL LRG LVL3 (GOWN DISPOSABLE) ×4 IMPLANT
GOWN STRL REUS W/TWL LRG LVL3 (GOWN DISPOSABLE) ×4
IMPL BREAST GEL 275CC (Breast) IMPLANT
IMPL BREAST P5.1XFULL RND 415 (Breast) IMPLANT
IMPL BRST P5.1XFULL RND 415CC (Breast) ×2 IMPLANT
IMPLANT BREAST GEL 275CC (Breast) ×2 IMPLANT
IMPLANT BREAST GEL 415CC (Breast) ×2 IMPLANT
KIT BASIN OR (CUSTOM PROCEDURE TRAY) ×2 IMPLANT
KIT TURNOVER KIT B (KITS) ×2 IMPLANT
NS IRRIG 1000ML POUR BTL (IV SOLUTION) ×4 IMPLANT
PACK GENERAL/GYN (CUSTOM PROCEDURE TRAY) ×2 IMPLANT
PAD ARMBOARD 7.5X6 YLW CONV (MISCELLANEOUS) ×2 IMPLANT
PIN SAFETY STERILE (MISCELLANEOUS) ×2 IMPLANT
SIZER BREAST 255CC (SIZER) ×2
SIZER BREAST 275CC (SIZER) ×2
SIZER BREAST 295CC (SIZER) ×2
SIZER BREAST 415CC (SIZER) ×2
SIZER BREAST 450CC (SIZER) ×2
SIZER BRST 415CC (SIZER) IMPLANT
SIZER BRST MDRT STRL LF 255CC (SIZER) IMPLANT
SIZER BRST MDRTSTRL LF S 275CC (SIZER) IMPLANT
SIZER BRST MDRTSTRL LF S 295CC (SIZER) IMPLANT
SIZER BRST P5.3X 450CC (SIZER) IMPLANT
SOL PREP POV-IOD 4OZ 10% (MISCELLANEOUS) IMPLANT
STAPLER VISISTAT 35W (STAPLE) ×2 IMPLANT
SUT MNCRL AB 4-0 PS2 18 (SUTURE) ×2 IMPLANT
SUT PDS AB 2-0 CT2 27 (SUTURE) IMPLANT
SUT VIC AB 3-0 PS2 18 (SUTURE) ×4
SUT VIC AB 3-0 PS2 18XBRD (SUTURE) ×2 IMPLANT
SUT VIC AB 3-0 SH 27 (SUTURE) ×6
SUT VIC AB 3-0 SH 27X BRD (SUTURE) IMPLANT
SUT VIC AB 4-0 PS2 27 (SUTURE) ×2 IMPLANT
SYR CONTROL 10ML LL (SYRINGE) IMPLANT
TOWEL GREEN STERILE (TOWEL DISPOSABLE) ×2 IMPLANT
TOWEL GREEN STERILE FF (TOWEL DISPOSABLE) ×2 IMPLANT
TUBE CONNECTING 12X1/4 (SUCTIONS) ×2 IMPLANT

## 2022-06-25 NOTE — Transfer of Care (Signed)
Immediate Anesthesia Transfer of Care Note  Patient: Tara White  Procedure(s) Performed: REMOVAL OF TISSUE EXPANDER AND PLACEMENT OF IMPLANT (Right: Chest) MAMMOPLASTY AUGMENTATION, MASTOPEXY (BREAST) (Left: Chest)  Patient Location: PACU  Anesthesia Type:General  Level of Consciousness: awake and patient cooperative  Airway & Oxygen Therapy: Patient Spontanous Breathing and Patient connected to face mask oxygen  Post-op Assessment: Report given to RN, Post -op Vital signs reviewed and stable, and Patient moving all extremities  Post vital signs: Reviewed and stable  Last Vitals:  Vitals Value Taken Time  BP 134/68 06/25/22 1000  Temp    Pulse 78 06/25/22 1002  Resp 15 06/25/22 1002  SpO2 100 % 06/25/22 1002  Vitals shown include unvalidated device data.  Last Pain:  Vitals:   06/25/22 0615  TempSrc:   PainSc: 0-No pain         Complications: No notable events documented.

## 2022-06-25 NOTE — Anesthesia Postprocedure Evaluation (Signed)
Anesthesia Post Note  Patient: Tara White  Procedure(s) Performed: REMOVAL OF TISSUE EXPANDER AND PLACEMENT OF IMPLANT (Right: Chest) MAMMOPLASTY AUGMENTATION, MASTOPEXY (BREAST) (Left: Chest)     Patient location during evaluation: Phase II Anesthesia Type: General Level of consciousness: awake and alert, patient cooperative and oriented Pain management: pain level controlled Vital Signs Assessment: post-procedure vital signs reviewed and stable Respiratory status: spontaneous breathing, nonlabored ventilation and respiratory function stable Cardiovascular status: blood pressure returned to baseline and stable Postop Assessment: no apparent nausea or vomiting and able to ambulate Anesthetic complications: no   No notable events documented.  Last Vitals:  Vitals:   06/25/22 1015 06/25/22 1030  BP: 135/82 123/75  Pulse: 71 80  Resp: 14   Temp:    SpO2: 100% 100%    Last Pain:  Vitals:   06/25/22 1030  TempSrc:   PainSc: 0-No pain                 Gurley Climer,E. Wasyl Dornfeld

## 2022-06-25 NOTE — Interval H&P Note (Signed)
History and Physical Interval Note:  06/25/2022 6:42 AM  Tara White  has presented today for surgery, with the diagnosis of hx right breast cancer, acquired absence breast, hx therapeutic radiation.  The various methods of treatment have been discussed with the patient and family. After consideration of risks, benefits and other options for treatment, the patient has consented to  Procedure(s): REMOVAL OF TISSUE EXPANDER AND PLACEMENT OF IMPLANT (Right) MAMMOPLASTY AUGMENTATION, MASTOPEXY (BREAST) (Left) as a surgical intervention.  The patient's history has been reviewed, patient examined, no change in status, stable for surgery.  I have reviewed the patient's chart and labs.  Questions were answered to the patient's satisfaction.     Arnoldo Hooker Nicklas Mcsweeney

## 2022-06-25 NOTE — Op Note (Signed)
Operative Note   DATE OF OPERATION: 3.29.24  LOCATION: [facility]-outpatient  SURGICAL DIVISION: Plastic Surgery  PREOPERATIVE DIAGNOSES:  1. History breast cancer 2. Acquired absence breast 3. History therapeutic radiation  POSTOPERATIVE DIAGNOSES:  same  PROCEDURE:  1. Removal right chest tissue expander and placement silicone implant 2. Left breast augmentation with silicone implant 3. Left breast mastopexy  SURGEON: Irene Limbo MD MBA  ASSISTANTCora Collum RNFA  ANESTHESIA:  General.   EBL: 65 ml  COMPLICATIONS: None immediate.   INDICATIONS FOR PROCEDURE:  The patient, Tara White, is a 32 y.o. female born on Aug 19, 1990, is here for staged breast reconstruction following right mastectomy with prepectoral tissue expander based reconstruction.    FINDINGS: Complete incorporation ADM noted over right chest. Left mastopexy 5 g skin only resection. Natrelle smooth round Soft Touch implants placed bilateral. RIGHT Full profile 415 ml REF [id number] LEFT Moderate profile 275 ml REF [id number]  DESCRIPTION OF PROCEDURE:  The patient's operative site was marked with the patient in the preoperative area. The patient was taken to the operating room. SCDs were placed and IV antibiotics were given. The patient's operative site was prepped and draped in a sterile fashion. Tegaderm placed over left nipple areola. A time out was performed and all information was confirmed to be correct. Incision made in right inframammary fold scar and carried through superficial fascia and acellular dermis. Expander removed. Capsulotomies performed superiorly and medially. Sizer placed. I then directed my attention to left breast. Vertical incision made over lower pole in area anticipated skin resection of mastopexy. Incision carried to pectoralis surface. Subfascial dissection completed to anticipated dimensions of implant. Sizer placed. Patent brought to upright sitting position. A 415 ml  Full profile implant selected for right chest, a 275 ml moderate profile implant selected for left chest. Patient tailor tacked for mastopexy over sizer on left breast. Patient returned to supine position.  Each cavity irrigated with saline solution containing Ancef, gentamicin, and Betadine. Hemostasis ensured. Local anesthetic infiltrated over bilateral chest. Implant placed in right chest. Orientation implant ensured. Closure completed with 3-0 vicryl in superficial fascia and acellular dermis, 4-0 vicryl in dermis, 4-0 monocryl subcuticular skin closure.  Over left breast, the inframammary fold was supported with interrupted 2-0 PDS suture from superficial fascia to chest wall. Implant placed in cavity. Implant orientation ensured. The breast pillars were closed over implant with interrupted 2-0 PDS suture. The skin marked for mastopexy was de epithelialized. The left nipple areolar complex was marked at the border of the patient's areola. Closure completed with interrupted and short running 3-0 vicryl in dermis along vertical and inframammary fold incisions. The nipple areola was inset with interrupted 4-0 vicryl in dermis. Skin closure completed with 4-0 monocryl subcuticular skin closure throughout.  Dermabond and dry dressing applied followed by breast binder. The patient was allowed to wake from anesthesia, extubated and taken to the recovery room in satisfactory condition.   SPECIMENS: left mastopexy  DRAINS: none  Irene Limbo, MD [facility] Plastic & Reconstructive Surgery  Office/ physician access line after hours [phone]

## 2022-06-25 NOTE — Anesthesia Procedure Notes (Signed)
Procedure Name: LMA Insertion Date/Time: 06/25/2022 7:40 AM  Performed by: Elvin So, CRNAPre-anesthesia Checklist: Patient identified, Emergency Drugs available, Suction available and Patient being monitored Patient Re-evaluated:Patient Re-evaluated prior to induction Oxygen Delivery Method: Circle System Utilized Preoxygenation: Pre-oxygenation with 100% oxygen Induction Type: IV induction Ventilation: Mask ventilation without difficulty LMA: LMA inserted LMA Size: 4.0 Number of attempts: 1 Airway Equipment and Method: Bite block Placement Confirmation: positive ETCO2 Tube secured with: Tape Dental Injury: Teeth and Oropharynx as per pre-operative assessment

## 2022-06-27 ENCOUNTER — Encounter (HOSPITAL_COMMUNITY): Payer: Self-pay | Admitting: Plastic Surgery

## 2022-06-29 LAB — SURGICAL PATHOLOGY

## 2022-08-09 ENCOUNTER — Ambulatory Visit: Payer: Commercial Managed Care - PPO | Attending: Surgery

## 2022-08-09 VITALS — Wt 156.1 lb

## 2022-08-09 DIAGNOSIS — Z483 Aftercare following surgery for neoplasm: Secondary | ICD-10-CM

## 2022-08-09 NOTE — Therapy (Signed)
OUTPATIENT PHYSICAL THERAPY SOZO SCREENING NOTE   Patient Name: Tara White MRN: [id number] DOB:1990-11-12, 32 y.o., female Today's Date: 08/09/2022  PCP: Patient, No Pcp Per REFERRING PROVIDER: Abigail Miyamoto, MD   PT End of Session - 08/09/22 1055     Visit Number 3   # unchanged due to screen only   PT Start Time 1053    PT Stop Time 1057    PT Time Calculation (min) 4 min    Activity Tolerance Patient tolerated treatment well    Behavior During Therapy [facility] for tasks assessed/performed             Past Medical History:  Diagnosis Date   DVT (deep venous thrombosis) (HCC) 06/25/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   Family history of breast cancer 04/21/2021   right breast ca 03/2021   Past Surgical History:  Procedure Laterality Date   BREAST ENHANCEMENT SURGERY Left 06/25/2022   Procedure: MAMMOPLASTY AUGMENTATION, MASTOPEXY (BREAST);  Surgeon: Glenna Fellows, MD;  Location: [facility] OR;  Service: Plastics;  Laterality: Left;   BREAST RECONSTRUCTION WITH PLACEMENT OF TISSUE EXPANDER AND ALLODERM Right 10/13/2021   Procedure: RIGHT BREAST RECONSTRUCTION WITH PLACEMENT OF TISSUE EXPANDER AND ALLODERM;  Surgeon: Glenna Fellows, MD;  Location: [facility];  Service: Plastics;  Laterality: Right;   CESAREAN SECTION N/A 12/10/2015   Procedure: CESAREAN SECTION;  Surgeon: Noland Fordyce, MD;  [facility]: [facility];  Service: Obstetrics;  Laterality: N/A;   IR CV LINE INJECTION  06/25/2021   IR RADIOLOGY PERIPHERAL GUIDED IV START  06/25/2021   IR US GUIDE VASC ACCESS LEFT  06/25/2021   IR VENO/EXT/UNI LEFT  06/25/2021   NODE DISSECTION Right 10/13/2021   Procedure: TARGETED LYMPH NODE DISSECTION;  Surgeon: Abigail Miyamoto, MD;  Location: [facility];  Service: General;  Laterality: Right;   PORT-A-CATH REMOVAL Left 05/27/2022   Procedure: REMOVAL PORT-A-CATH;  Surgeon: Abigail Miyamoto, MD;  Location: [facility];  Service: General;   Laterality: Left;   PORTACATH PLACEMENT Left 04/30/2021   Procedure: INSERTION PORT-A-CATH;  Surgeon: Abigail Miyamoto, MD;  Location: [facility];  Service: General;  Laterality: Left;   REMOVAL OF TISSUE EXPANDER AND PLACEMENT OF IMPLANT Right 06/25/2022   Procedure: REMOVAL OF TISSUE EXPANDER AND PLACEMENT OF IMPLANT;  Surgeon: Glenna Fellows, MD;  Location: [facility] OR;  Service: Plastics;  Laterality: Right;   SIMPLE MASTECTOMY WITH AXILLARY SENTINEL NODE BIOPSY Right 10/13/2021   Procedure: RIGHT MASTECTOMY;  Surgeon: Abigail Miyamoto, MD;  Location: [facility];  Service: General;  Laterality: Right;   WISDOM TOOTH EXTRACTION     Patient Active Problem List   Diagnosis Date Noted   Breast cancer, right (HCC) 10/13/2021   Port-A-Cath in place 07/17/2021   Family history of breast cancer 04/21/2021   Genetic testing 04/21/2021   Malignant neoplasm of upper-outer quadrant of right breast in female, estrogen receptor negative (HCC) 04/14/2021    REFERRING DIAG: right breast cancer at risk for lymphedema  THERAPY DIAG:  Aftercare following surgery for neoplasm  PERTINENT HISTORY: Patient was diagnosed on 04/08/2021 with right grade III invasive ductal carcinoma breast cancer. It measures 3 cm with 10.8 cm of calcifications and is located in the upper outer quadrant. It is ER/PR negative and HER2 positive with a Ki67 of 30%. She has a biopsied positive axillary lymph node. 10/13/21- R mastectomy and SLNB (0/5)with reconstruction and expander placed   PRECAUTIONS: right UE Lymphedema risk, None  SUBJECTIVE: Pt  returns for her 3 month L-Dex screen.   PAIN:  Are you having pain? No  SOZO SCREENING: Patient was assessed today using the SOZO machine to determine the lymphedema index score. This was compared to her baseline score. It was determined that she is within the recommended range when compared to her baseline and no further action is needed at this time. She  will continue SOZO screenings. These are done every 3 months for 2 years post operatively followed by every 6 months for 2 years, and then annually.   L-DEX FLOWSHEETS - 08/09/22 1000       L-DEX LYMPHEDEMA SCREENING   Measurement Type Unilateral    L-DEX MEASUREMENT EXTREMITY Upper Extremity    POSITION  Standing    DOMINANT SIDE Right    At Risk Side Right    BASELINE SCORE (UNILATERAL) -7.4    L-DEX SCORE (UNILATERAL) -8.4    VALUE CHANGE (UNILAT) -1               Hermenia Bers, PTA 08/09/2022, 10:56 AM

## 2022-08-10 ENCOUNTER — Other Ambulatory Visit: Payer: Self-pay

## 2022-08-20 ENCOUNTER — Other Ambulatory Visit: Payer: Self-pay

## 2022-08-20 ENCOUNTER — Inpatient Hospital Stay: Payer: Commercial Managed Care - PPO | Attending: Adult Health | Admitting: Adult Health

## 2022-08-20 ENCOUNTER — Encounter: Payer: Self-pay | Admitting: Adult Health

## 2022-08-20 VITALS — BP 120/68 | HR 61 | Temp 97.7°F | Resp 17 | Ht 69.0 in | Wt 159.5 lb

## 2022-08-20 DIAGNOSIS — R92343 Mammographic extreme density, bilateral breasts: Secondary | ICD-10-CM | POA: Diagnosis not present

## 2022-08-20 DIAGNOSIS — Z853 Personal history of malignant neoplasm of breast: Secondary | ICD-10-CM | POA: Diagnosis present

## 2022-08-20 DIAGNOSIS — C50411 Malignant neoplasm of upper-outer quadrant of right female breast: Secondary | ICD-10-CM

## 2022-08-20 DIAGNOSIS — Z171 Estrogen receptor negative status [ER-]: Secondary | ICD-10-CM | POA: Diagnosis not present

## 2022-08-20 NOTE — Progress Notes (Signed)
SURVIVORSHIP VISIT:  BRIEF ONCOLOGIC HISTORY:  Oncology History  Malignant neoplasm of upper-outer quadrant of right breast in female, estrogen receptor negative (HCC)  04/08/2021 Initial Diagnosis   History of right breast eczematous change to the nipple, patient had palpable lump in the right breast her mammogram 2.1 cm spiculated mass with extensive calcifications that span 11 cm.  Ultrasound revealed 3 cm mass at 10:00 plus small satellite lesions.  Biopsy revealed grade 3 IDC ER 0%, PR 0%, HER2 3+ positive, Ki-67 30%   04/08/2021 Cancer Staging   Staging form: Breast, AJCC 8th Edition - Clinical stage from 04/08/2021: Stage IIB (cT2, cN1, cM0, G3, ER-, PR-, HER2+) - Signed by Serena Croissant, MD on 04/15/2021 Stage prefix: Initial diagnosis Method of lymph node assessment: Axillary lymph node dissection Histologic grading system: 3 grade system   05/01/2021 - 11/26/2021 Chemotherapy   Patient is on Treatment Plan : BREAST  Docetaxel + Carboplatin + Trastuzumab + Pertuzumab  (TCHP) q21d      05/21/2021 Genetic Testing   Negative hereditary cancer genetic testing: no pathogenic variants detected in [facility] CustomNext-Cancer +RNAinsight Panel.  Report date is 05/21/2021.   The CustomNext-Cancer+RNAinsight panel offered by [facility] includes sequencing and rearrangement analysis for the following 47 genes:  APC, ATM, AXIN2, BARD1, BMPR1A, BRCA1, BRCA2, BRIP1, CDH1, CDK4, CDKN2A, CHEK2, DICER1, EPCAM, GREM1, HOXB13, MEN1, MLH1, MSH2, MSH3, MSH6, MUTYH, NBN, NF1, NF2, NTHL1, PALB2, PMS2, POLD1, POLE, PTEN, RAD51C, RAD51D, RECQL, RET, SDHA, SDHAF2, SDHB, SDHC, SDHD, SMAD4, SMARCA4, STK11, TP53, TSC1, TSC2, and VHL.  RNA data is routinely analyzed for use in variant interpretation for all genes.   09/04/2021 -  Chemotherapy   Patient is on Treatment Plan : BREAST Trastuzumab  + Pertuzumab q21d x 13 cycles     12/03/2021 - 01/11/2022 Radiation Therapy   Site Technique Total Dose (Gy) Dose per Fx (Gy)  Completed Fx Beam Energies  Chest Wall, Right: CW_R_IMN 3D 50.4/50.4 1.8 28/28 6XFFF  Chest Wall, Right: CW_R_PAB_SCV 3D 50.4/50.4 1.8 28/28 6X, 10X       INTERVAL HISTORY:  Tara White to review her survivorship care plan detailing her treatment course for breast cancer, as well as monitoring long-term side effects of that treatment, education regarding health maintenance, screening, and overall wellness and health promotion.     Overall, Tara White reports feeling quite well.  She has completed all of her reconstruction surgeries, and denies any current issues today.  She underwent a left breast mammogram in 03/2022 and it demonstrated no mammographic evidence of malignancy and breast density category D.   REVIEW OF SYSTEMS:  Review of Systems  Constitutional:  Negative for appetite change, chills, fatigue, fever and unexpected weight change.  HENT:   Negative for hearing loss, lump/mass and trouble swallowing.   Eyes:  Negative for eye problems and icterus.  Respiratory:  Negative for chest tightness, cough and shortness of breath.   Cardiovascular:  Negative for chest pain, leg swelling and palpitations.  Gastrointestinal:  Negative for abdominal distention, abdominal pain, constipation, diarrhea, nausea and vomiting.  Endocrine: Negative for hot flashes.  Genitourinary:  Negative for difficulty urinating.   Musculoskeletal:  Negative for arthralgias.  Skin:  Negative for itching and rash.  Neurological:  Negative for dizziness, extremity weakness, headaches and numbness.  Hematological:  Negative for adenopathy. Does not bruise/bleed easily.  Psychiatric/Behavioral:  Negative for depression. The patient is not nervous/anxious.    Breast: Denies any new nodularity, masses, tenderness, nipple changes, or nipple discharge.  PAST MEDICAL/SURGICAL HISTORY:  Past Medical History:  Diagnosis Date   DVT (deep venous thrombosis) (HCC) 06/25/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   Family history  of breast cancer 04/21/2021   right breast ca 03/2021   Past Surgical History:  Procedure Laterality Date   BREAST ENHANCEMENT SURGERY Left 06/25/2022   Procedure: MAMMOPLASTY AUGMENTATION, MASTOPEXY (BREAST);  Surgeon: Glenna Fellows, MD;  Location: [facility];  Service: Plastics;  Laterality: Left;   BREAST RECONSTRUCTION WITH PLACEMENT OF TISSUE EXPANDER AND ALLODERM Right 10/13/2021   Procedure: RIGHT BREAST RECONSTRUCTION WITH PLACEMENT OF TISSUE EXPANDER AND ALLODERM;  Surgeon: Glenna Fellows, MD;  Location: [facility];  Service: Plastics;  Laterality: Right;   CESAREAN SECTION N/A 12/10/2015   Procedure: CESAREAN SECTION;  Surgeon: Noland Fordyce, MD;  Location: [facility];  Service: Obstetrics;  Laterality: N/A;   IR CV LINE INJECTION  06/25/2021   IR RADIOLOGY PERIPHERAL GUIDED IV START  06/25/2021   IR US GUIDE VASC ACCESS LEFT  06/25/2021   IR VENO/EXT/UNI LEFT  06/25/2021   NODE DISSECTION Right 10/13/2021   Procedure: TARGETED LYMPH NODE DISSECTION;  Surgeon: Abigail Miyamoto, MD;  Location: [facility];  Service: General;  Laterality: Right;   PORT-A-CATH REMOVAL Left 05/27/2022   Procedure: REMOVAL PORT-A-CATH;  Surgeon: Abigail Miyamoto, MD;  Location: [facility];  Service: General;  Laterality: Left;   PORTACATH PLACEMENT Left 04/30/2021   Procedure: INSERTION PORT-A-CATH;  Surgeon: Abigail Miyamoto, MD;  Location: [facility];  Service: General;  Laterality: Left;   REMOVAL OF TISSUE EXPANDER AND PLACEMENT OF IMPLANT Right 06/25/2022   Procedure: REMOVAL OF TISSUE EXPANDER AND PLACEMENT OF IMPLANT;  Surgeon: Glenna Fellows, MD;  Location: [facility];  Service: Plastics;  Laterality: Right;   SIMPLE MASTECTOMY WITH AXILLARY SENTINEL NODE BIOPSY Right 10/13/2021   Procedure: RIGHT MASTECTOMY;  Surgeon: Abigail Miyamoto, MD;  Location: [facility];  Service: General;  Laterality: Right;   WISDOM TOOTH  EXTRACTION       ALLERGIES:  No Known Allergies   CURRENT MEDICATIONS:  Outpatient Encounter Medications as of 08/20/2022  Medication Sig   carboxymethylcellulose (REFRESH TEARS) 0.5 % SOLN Place 1 drop into both eyes 3 (three) times daily as needed (Dry eye).   Cholecalciferol (VITAMIN D3) 125 MCG (5000 UT) capsule Take 5,000 Units by mouth daily.   Clindamycin-Benzoyl Per, Refr, gel Apply 1 Application topically 2 (two) times daily.   Probiotic Product (PROBIOTIC PO) Take 1 tablet by mouth daily.   sulfamethoxazole-trimethoprim (BACTRIM DS) 800-160 MG tablet Take 1 tablet by mouth 2 (two) times daily.   traMADol (ULTRAM) 50 MG tablet Take 1 tablet (50 mg total) by mouth every 6 (six) hours as needed for moderate pain.   No facility-administered encounter medications on file as of 08/20/2022.     ONCOLOGIC FAMILY HISTORY:  Family History  Problem Relation Age of Onset   Hypothyroidism Mother    Breast cancer Maternal Grandmother 104   Skin cancer Maternal Grandfather    Heart failure Paternal Grandfather      SOCIAL HISTORY:  Social History   Socioeconomic History   Marital status: Married    Spouse name: Not on file   Number of children: Not on file   Years of education: Not on file   Highest education level: Not on file  Occupational History   Not on file  Tobacco Use   Smoking status: Never   Smokeless tobacco: Never  Vaping Use  Vaping Use: Not on file  Substance and Sexual Activity   Alcohol use: Yes    Alcohol/week: 2.0 standard drinks of alcohol    Types: 2 Standard drinks or equivalent per week    Comment: rarely   Drug use: No   Sexual activity: Not Currently    Partners: Male    Birth control/protection: Other-see comments    Comment: cycles have not resumed since receiving chemotherapy  Other Topics Concern   Not on file  Social History Narrative   Not on file   Social Determinants of Health   Financial Resource Strain: Not on file  Food  Insecurity: Not on file  Transportation Needs: Not on file  Physical Activity: Not on file  Stress: Not on file  Social Connections: Not on file  Intimate Partner Violence: Not on file     OBSERVATIONS/OBJECTIVE:  BP 120/68 (BP Location: Left Arm, Patient Position: Sitting)   Pulse 61   Temp 97.7 F (36.5 C) (Temporal)   Resp 17   Ht 5\' 9"  (1.753 m)   Wt 159 lb 8 oz (72.3 kg)   SpO2 100%   BMI 23.55 kg/m  GENERAL: Patient is a well appearing female in no acute distress HEENT:  Sclerae anicteric.  Oropharynx clear and moist. No ulcerations or evidence of oropharyngeal candidiasis. Neck is supple.  NODES:  No cervical, supraclavicular, or axillary lymphadenopathy palpated.  BREAST EXAM:  left breast s/p reduction, benign, right breast s/p mastectomy and radiation, no sign of local recurrence. LUNGS:  Clear to auscultation bilaterally.  No wheezes or rhonchi. HEART:  Regular rate and rhythm. No murmur appreciated. ABDOMEN:  Soft, nontender.  Positive, normoactive bowel sounds. No organomegaly palpated. MSK:  No focal spinal tenderness to palpation. Full range of motion bilaterally in the upper extremities. EXTREMITIES:  No peripheral edema.   SKIN:  Clear with no obvious rashes or skin changes. No nail dyscrasia. NEURO:  Nonfocal. Well oriented.  Appropriate affect.   LABORATORY DATA:  None for this visit.  DIAGNOSTIC IMAGING:  None for this visit.      ASSESSMENT AND PLAN:  Tara White is a pleasant 32 y.o. female with Stage IIB right breast invasive ductal carcinoma, ER-/PR-/HER2+, diagnosed in 03/2021, treated with neoadjuvant chemotherapy, mastectomy, maintenance Herceptin/Perjeta, and adjuvant radiation .  She presents to the Survivorship Clinic for our initial meeting and routine follow-up post-completion of treatment for breast cancer.    1. Stage IIB right breast cancer:  Tara White is continuing to recover from definitive treatment for breast cancer. She will  follow-up with her medical oncologist, Dr.  Pamelia Hoit in 6 months with history and physical exam per surveillance protocol.   We recommend repeat left breast mammogram in January 2025.  Due to her breast density category D and her being a young breast cancer survivor at high risk for breast cancer recurrence in her contralateral breast we recommend intensified screening with breast MRI in July which I ordered today.  Today, a comprehensive survivorship care plan and treatment summary was reviewed with the patient today detailing her breast cancer diagnosis, treatment course, potential late/long-term effects of treatment, appropriate follow-up care with recommendations for the future, and patient education resources.  A copy of this summary, along with a letter will be sent to the patient's primary care provider via mail/fax/In Basket message after today's visit.    2. Bone health:  [place]  She was given education on specific activities to promote bone health.  3. Cancer screening:  Due to Tara White's history and her age, she should receive screening for skin cancers, colon cancer (beginning at age 5), and gynecologic cancers.  The information and recommendations are listed on the patient's comprehensive care plan/treatment summary and were reviewed in detail with the patient.    4. Health maintenance and wellness promotion: Tara White was encouraged to consume 5-7 servings of fruits and vegetables per day. We reviewed the "Nutrition Rainbow" handout.  She was also encouraged to engage in moderate to vigorous exercise for 30 minutes per day most days of the week.  She was instructed to limit her alcohol consumption and continue to abstain from tobacco use.     5. Support services/counseling: It is not uncommon for this period of the patient's cancer care trajectory to be one of many emotions and stressors.   She was given information regarding our available services and encouraged to contact me with any questions  or for help enrolling in any of our support group/programs.    Follow up instructions:    -Return to cancer center in 6 months for follow up with Dr. Pamelia Hoit  -Mammogram due in 03/2023 -Breast MRI 09/2022 -She is welcome to return back to the Survivorship Clinic at any time; no additional follow-up needed at this time.  -Consider referral back to survivorship as a long-term survivor for continued surveillance  The patient was provided an opportunity to ask questions and all were answered. The patient agreed with the plan and demonstrated an understanding of the instructions.   Total encounter time:45 minutes*in face-to-face visit time, chart review, lab review, care coordination, order entry, and documentation of the encounter time.    Tara Anes, NP 08/20/22 3:34 PM Medical Oncology and Hematology [facility] [address] [place], [place] [address] Tel. [phone]    Fax. [phone]  *Total Encounter Time as defined by the Centers for Medicare and Medicaid Services includes, in addition to the face-to-face time of a patient visit (documented in the note above) non-face-to-face time: obtaining and reviewing outside history, ordering and reviewing medications, tests or procedures, care coordination (communications with other health care professionals or caregivers) and documentation in the medical record.

## 2022-08-25 ENCOUNTER — Encounter: Payer: Self-pay | Admitting: Hematology and Oncology

## 2022-09-07 ENCOUNTER — Encounter: Payer: Self-pay | Admitting: Adult Health

## 2022-09-09 ENCOUNTER — Other Ambulatory Visit: Payer: Self-pay | Admitting: *Deleted

## 2022-09-09 DIAGNOSIS — M542 Cervicalgia: Secondary | ICD-10-CM

## 2022-09-09 DIAGNOSIS — C50411 Malignant neoplasm of upper-outer quadrant of right female breast: Secondary | ICD-10-CM

## 2022-09-14 ENCOUNTER — Ambulatory Visit (HOSPITAL_COMMUNITY): Admission: RE | Admit: 2022-09-14 | Payer: Commercial Managed Care - PPO | Source: Ambulatory Visit

## 2022-09-15 ENCOUNTER — Observation Stay (HOSPITAL_COMMUNITY): Payer: Commercial Managed Care - PPO

## 2022-09-15 ENCOUNTER — Ambulatory Visit
Admit: 2022-09-15 | Discharge: 2022-09-15 | Disposition: A | Discharge status: Home / Self Care | Payer: Commercial Managed Care - PPO | Attending: Radiation Oncology | Admitting: Radiation Oncology

## 2022-09-15 ENCOUNTER — Observation Stay (HOSPITAL_COMMUNITY)
Admission: EM | Admit: 2022-09-15 | Discharge: 2022-09-16 | Disposition: A | Discharge status: Home / Self Care | Payer: Commercial Managed Care - PPO | Attending: Internal Medicine | Admitting: Internal Medicine

## 2022-09-15 ENCOUNTER — Emergency Department (HOSPITAL_COMMUNITY): Payer: Commercial Managed Care - PPO

## 2022-09-15 ENCOUNTER — Encounter (HOSPITAL_COMMUNITY): Payer: Self-pay | Admitting: Internal Medicine

## 2022-09-15 ENCOUNTER — Telehealth: Payer: Self-pay | Admitting: *Deleted

## 2022-09-15 ENCOUNTER — Ambulatory Visit (HOSPITAL_COMMUNITY)
Admit: 2022-09-15 | Discharge: 2022-09-15 | Disposition: A | Discharge status: Home / Self Care | Payer: Commercial Managed Care - PPO | Attending: Internal Medicine | Admitting: Internal Medicine

## 2022-09-15 ENCOUNTER — Other Ambulatory Visit: Payer: Self-pay

## 2022-09-15 DIAGNOSIS — G9389 Other specified disorders of brain: Principal | ICD-10-CM

## 2022-09-15 DIAGNOSIS — Z86718 Personal history of other venous thrombosis and embolism: Secondary | ICD-10-CM | POA: Insufficient documentation

## 2022-09-15 DIAGNOSIS — Z853 Personal history of malignant neoplasm of breast: Secondary | ICD-10-CM | POA: Diagnosis not present

## 2022-09-15 DIAGNOSIS — Z79899 Other long term (current) drug therapy: Secondary | ICD-10-CM | POA: Diagnosis not present

## 2022-09-15 DIAGNOSIS — D496 Neoplasm of unspecified behavior of brain: Secondary | ICD-10-CM

## 2022-09-15 DIAGNOSIS — C7931 Secondary malignant neoplasm of brain: Secondary | ICD-10-CM | POA: Insufficient documentation

## 2022-09-15 DIAGNOSIS — H532 Diplopia: Secondary | ICD-10-CM | POA: Diagnosis present

## 2022-09-15 LAB — I-STAT CHEM 8, ED
BUN: 18 mg/dL (ref 6–20)
Calcium, Ion: 1.23 mmol/L (ref 1.15–1.40)
Chloride: 106 mmol/L (ref 98–111)
Creatinine, Ser: 1 mg/dL (ref 0.44–1.00)
Glucose, Bld: 91 mg/dL (ref 70–99)
HCT: 42 % (ref 36.0–46.0)
Hemoglobin: 14.3 g/dL (ref 12.0–15.0)
Potassium: 4.3 mmol/L (ref 3.5–5.1)
Sodium: 139 mmol/L (ref 135–145)
TCO2: 26 mmol/L (ref 22–32)

## 2022-09-15 LAB — CBC WITH DIFFERENTIAL/PLATELET
Abs Immature Granulocytes: 0.03 10*3/uL (ref 0.00–0.07)
Basophils Absolute: 0 10*3/uL (ref 0.0–0.1)
Basophils Relative: 1 %
Eosinophils Absolute: 0.1 10*3/uL (ref 0.0–0.5)
Eosinophils Relative: 1 %
HCT: 41.6 % (ref 36.0–46.0)
Hemoglobin: 14.2 g/dL (ref 12.0–15.0)
Immature Granulocytes: 1 %
Lymphocytes Relative: 26 %
Lymphs Abs: 1.7 10*3/uL (ref 0.7–4.0)
MCH: 31.1 pg (ref 26.0–34.0)
MCHC: 34.1 g/dL (ref 30.0–36.0)
MCV: 91.2 fL (ref 80.0–100.0)
Monocytes Absolute: 0.4 10*3/uL (ref 0.1–1.0)
Monocytes Relative: 6 %
Neutro Abs: 4.3 10*3/uL (ref 1.7–7.7)
Neutrophils Relative %: 65 %
Platelets: 212 10*3/uL (ref 150–400)
RBC: 4.56 MIL/uL (ref 3.87–5.11)
RDW: 13.2 % (ref 11.5–15.5)
WBC: 6.5 10*3/uL (ref 4.0–10.5)
nRBC: 0 % (ref 0.0–0.2)

## 2022-09-15 MED ORDER — ENOXAPARIN SODIUM 40 MG/0.4ML IJ SOSY
40.0000 mg | PREFILLED_SYRINGE | INTRAMUSCULAR | Status: DC
  Administered 2022-09-15: 40 mg via SUBCUTANEOUS
  Filled 2022-09-15: qty 0.4

## 2022-09-15 MED ORDER — ACETAMINOPHEN 650 MG RE SUPP: 650.0000 mg | Freq: Four times a day (QID) | RECTAL | Status: DC | PRN

## 2022-09-15 MED ORDER — DEXAMETHASONE SODIUM PHOSPHATE 10 MG/ML IJ SOLN
6.0000 mg | Freq: Four times a day (QID) | INTRAMUSCULAR | Status: DC
  Administered 2022-09-15 – 2022-09-16 (×4): 6 mg via INTRAVENOUS
  Filled 2022-09-15 (×4): qty 1

## 2022-09-15 MED ORDER — GADOBUTROL 1 MMOL/ML IV SOLN
7.0000 mL | Freq: Once | INTRAVENOUS | Status: AC | PRN
  Administered 2022-09-15: 7 mL via INTRAVENOUS

## 2022-09-15 MED ORDER — LACTATED RINGERS IV SOLN: INTRAVENOUS | Status: DC

## 2022-09-15 MED ORDER — DEXAMETHASONE SODIUM PHOSPHATE 10 MG/ML IJ SOLN
10.0000 mg | Freq: Once | INTRAMUSCULAR | Status: AC
  Administered 2022-09-15: 10 mg via INTRAVENOUS
  Filled 2022-09-15: qty 1

## 2022-09-15 MED ORDER — SODIUM CHLORIDE (PF) 0.9 % IJ SOLN
INTRAMUSCULAR | Status: AC
  Filled 2022-09-15: qty 50

## 2022-09-15 MED ORDER — ONDANSETRON HCL 4 MG PO TABS: 4.0000 mg | ORAL_TABLET | Freq: Four times a day (QID) | ORAL | Status: DC | PRN

## 2022-09-15 MED ORDER — IOHEXOL 9 MG/ML PO SOLN
ORAL | Status: AC
  Filled 2022-09-15: qty 1000

## 2022-09-15 MED ORDER — ALBUTEROL SULFATE (2.5 MG/3ML) 0.083% IN NEBU: 2.5000 mg | INHALATION_SOLUTION | RESPIRATORY_TRACT | Status: DC | PRN

## 2022-09-15 MED ORDER — ACETAMINOPHEN 325 MG PO TABS: 650.0000 mg | ORAL_TABLET | Freq: Four times a day (QID) | ORAL | Status: DC | PRN

## 2022-09-15 MED ORDER — ONDANSETRON HCL 4 MG/2ML IJ SOLN: 4.0000 mg | Freq: Four times a day (QID) | INTRAMUSCULAR | Status: DC | PRN

## 2022-09-15 MED ORDER — IOHEXOL 9 MG/ML PO SOLN: 1000.0000 mL | ORAL | Status: AC

## 2022-09-15 MED ORDER — IOHEXOL 350 MG/ML SOLN
75.0000 mL | Freq: Once | INTRAVENOUS | Status: AC | PRN
  Administered 2022-09-15: 75 mL via INTRAVENOUS

## 2022-09-15 MED ORDER — IOHEXOL 300 MG/ML  SOLN
100.0000 mL | Freq: Once | INTRAMUSCULAR | Status: AC | PRN
  Administered 2022-09-15: 80 mL via INTRAVENOUS

## 2022-09-15 MED ORDER — TRAZODONE HCL 50 MG PO TABS: 25.0000 mg | ORAL_TABLET | Freq: Every evening | ORAL | Status: DC | PRN

## 2022-09-15 NOTE — Progress Notes (Signed)
Patient ID: Tara White, female   DOB: 30-Jan-1991, 32 y.o.   MRN: [id number] Films reviewed. [name] will need a 3T mri scan with and without contrast. Continue decadron after 10mg  bolus at 6 Q6 iv or po.  Will need an oncology consult, and radiation oncology consult. I will see later this evening. She will also need Chest, abdomen, and pelvic CT with contrast to look for possible metastatic disease.

## 2022-09-15 NOTE — ED Triage Notes (Addendum)
Pt reports double vision "they don't work together, I'm dizzy and can't see well" Symptoms started May 6. Eye doctor recommended MRI

## 2022-09-15 NOTE — ED Notes (Signed)
MRI at [facility] reports able to take pt for testing at 1600. [facility] will need to be arranged. Info passed along in report to floor RN

## 2022-09-15 NOTE — H&P (Signed)
History and Physical  Tara White ONG:[id number] DOB: [birth year] DOA: 09/15/2022  PCP: Shawnie Dapper, PA-C   Chief Complaint: diplopia   HPI: Tara White is a 32 y.o. 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  Patient states she has been feeling well until about a little over a month ago, when she was having some pain at the base of her neck, saw her chiropractor for this.  Starting a little over a week ago, she started noticing some diplopia, which worsened throughout the day.  Denies any extremity weakness, numbness, tingling, weight loss, significant headache.  There was some associated frontal pressure which seem to worsen with standing upright.  ED Course: Evaluation in the emergency department reveals normal vitals and lab work.  CT angio head and neck with the unfortunate findings mentioned above, and detailed below.  ER provider discussed with oncology as well as neurosurgery.  Review of Systems: Please see HPI for pertinent positives and negatives. A complete 10 system review of systems are otherwise negative.  Past Medical History:  Diagnosis Date   DVT (deep venous thrombosis) (HCC) 06/25/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   Family history of breast cancer 04/21/2021   right breast ca 03/2021   Past Surgical History:  Procedure Laterality Date   BREAST ENHANCEMENT SURGERY Left 06/25/2022   Procedure: MAMMOPLASTY AUGMENTATION, MASTOPEXY (BREAST);  Surgeon: Glenna Fellows, MD;  Location: [facility];  Service: Plastics;  Laterality: Left;   BREAST RECONSTRUCTION WITH PLACEMENT OF TISSUE EXPANDER AND ALLODERM Right 10/13/2021   Procedure: RIGHT BREAST  RECONSTRUCTION WITH PLACEMENT OF TISSUE EXPANDER AND ALLODERM;  Surgeon: Glenna Fellows, MD;  Location: [facility];  Service: Plastics;  Laterality: Right;   CESAREAN SECTION N/A 12/10/2015   Procedure: CESAREAN SECTION;  Surgeon: Noland Fordyce, MD;  Location: [facility];  Service: Obstetrics;  Laterality: N/A;   IR CV LINE INJECTION  06/25/2021   IR RADIOLOGY PERIPHERAL GUIDED IV START  06/25/2021   IR US GUIDE VASC ACCESS LEFT  06/25/2021   IR VENO/EXT/UNI LEFT  06/25/2021   NODE DISSECTION Right 10/13/2021   Procedure: TARGETED LYMPH NODE DISSECTION;  Surgeon: Abigail Miyamoto, MD;  Location: [facility];  Service: General;  Laterality: Right;   PORT-A-CATH REMOVAL Left 05/27/2022   Procedure: REMOVAL PORT-A-CATH;  Surgeon: Abigail Miyamoto, MD;  Location: [facility];  Service: General;  Laterality: Left;   PORTACATH PLACEMENT Left 04/30/2021   Procedure: INSERTION PORT-A-CATH;  Surgeon: Abigail Miyamoto, MD;  Location: [facility];  Service: General;  Laterality: Left;   REMOVAL OF TISSUE EXPANDER AND PLACEMENT OF IMPLANT Right 06/25/2022   Procedure: REMOVAL OF TISSUE EXPANDER AND PLACEMENT OF IMPLANT;  Surgeon: Glenna Fellows, MD;  Location: [facility];  Service: Plastics;  Laterality: Right;   SIMPLE MASTECTOMY WITH AXILLARY SENTINEL NODE BIOPSY Right 10/13/2021   Procedure: RIGHT MASTECTOMY;  Surgeon: Abigail Miyamoto, MD;  Location: [facility];  Service: General;  Laterality: Right;   WISDOM TOOTH EXTRACTION      Social History:  reports that she has never smoked. She has never used smokeless tobacco. She reports current alcohol use of about 2.0 standard drinks of alcohol per week. She reports that  she does not use drugs.   No Known Allergies  Family History  Problem Relation Age of Onset   Hypothyroidism Mother    Breast cancer Maternal Grandmother 81   Skin cancer Maternal Grandfather    Heart failure  Paternal Grandfather      Prior to Admission medications   Medication Sig Start Date End Date Taking? Authorizing Provider  carboxymethylcellulose (REFRESH TEARS) 0.5 % SOLN Place 1 drop into both eyes 3 (three) times daily as needed (Dry eye).    [provider]  Cholecalciferol (VITAMIN D3) 125 MCG (5000 UT) capsule Take 5,000 Units by mouth daily.    [provider]  Clindamycin-Benzoyl Per, Refr, gel Apply 1 Application topically 2 (two) times daily. 06/14/22   [provider]  Probiotic Product (PROBIOTIC PO) Take 1 tablet by mouth daily.    [provider]  traMADol (ULTRAM) 50 MG tablet Take 1 tablet (50 mg total) by mouth every 6 (six) hours as needed for moderate pain. 05/27/22   Abigail Miyamoto, MD   Physical Exam: BP 130/87   Pulse 60   Temp 97.8 F (36.6 C) (Oral)   Resp 17   Ht 5\' 9"  (1.753 m)   Wt 72.6 kg   LMP 09/08/2022   SpO2 100%   BMI 23.63 kg/m   General:  Alert, oriented, calm, in no acute distress, her father is at the bedside Eyes: EOMI, clear conjuctivae, white sclerea Neck: supple, no masses, trachea mildline  Cardiovascular: RRR, no peripheral edema  Respiratory: Equal chest rise, normal spontaneous breathing Abdomen: Flat, nondistended Skin: dry, no rashes  Musculoskeletal: no joint effusions, normal range of motion  Psychiatric: appropriate affect, normal speech  Neurologic: extraocular muscles intact, clear speech, moving all extremities with intact sensorium          Labs on Admission:  Basic Metabolic Panel: Recent Labs  Lab 09/15/22 1025  NA 139  K 4.3  CL 106  GLUCOSE 91  BUN 18  CREATININE 1.00   Liver Function Tests: No results for input(s): "AST", "ALT", "ALKPHOS", "BILITOT", "PROT", "ALBUMIN" in the last 168 hours. No results for input(s): "LIPASE", "AMYLASE" in the last 168 hours. No results for input(s): "AMMONIA" in the last 168 hours. CBC: Recent Labs  Lab 09/15/22 1010 09/15/22 1025   WBC 6.5  --   NEUTROABS 4.3  --   HGB 14.2 14.3  HCT 41.6 42.0  MCV 91.2  --   PLT 212  --    Cardiac Enzymes: No results for input(s): "CKTOTAL", "CKMB", "CKMBINDEX", "TROPONINI" in the last 168 hours.  BNP (last 3 results) No results for input(s): "BNP" in the last 8760 hours.  ProBNP (last 3 results) No results for input(s): "PROBNP" in the last 8760 hours.  CBG: No results for input(s): "GLUCAP" in the last 168 hours.  Radiological Exams on Admission: CT ANGIO HEAD NECK W WO CM  Result Date: 09/15/2022 CLINICAL DATA:  Diplopia. EXAM: CT ANGIOGRAPHY HEAD AND NECK WITH AND WITHOUT CONTRAST TECHNIQUE: Multidetector CT imaging of the head and neck was performed using the standard protocol during bolus administration of intravenous contrast. Multiplanar CT image reconstructions and MIPs were obtained to evaluate the vascular anatomy. Carotid stenosis measurements (when applicable) are obtained utilizing NASCET criteria, using the distal internal carotid diameter as the denominator. RADIATION DOSE REDUCTION: This exam was performed according to the departmental dose-optimization program which includes automated exposure control, adjustment of the mA and/or kV according to patient size and/or use of iterative  reconstruction technique. CONTRAST:  75mL OMNIPAQUE IOHEXOL 350 MG/ML SOLN COMPARISON:  None Available. FINDINGS: CT HEAD FINDINGS Brain: 35 x 30 mm enhancing mass with surrounding vasogenic edema centered within the left caudate (axial image 15 series 5, image 16 series 21), suspicious for metastasis. Cortical gray-white differentiation is preserved. No acute hemorrhage. Associated 13 mm of rightward midline shift and diffuse sulcal effacement. Vascular: No hyperdense vessel or unexpected calcification. Skull: No calvarial fracture or suspicious bone lesion. Skull base is unremarkable. Sinuses/Orbits: Unremarkable. Other: None. Review of the MIP images confirms the above findings CTA NECK  FINDINGS Aortic arch: Three-vessel arch configuration. Arch vessel origins are patent. Right carotid system: No evidence of dissection, stenosis (50% or greater), or occlusion. Left carotid system: No evidence of dissection, stenosis (50% or greater), or occlusion. Vertebral arteries: Left dominant. No evidence of dissection, stenosis (50% or greater), or occlusion. Skeleton: Unremarkable. Other neck: Unremarkable. Upper chest: Unremarkable. Review of the MIP images confirms the above findings CTA HEAD FINDINGS Anterior circulation: Intracranial ICAs are patent without stenosis or aneurysm. The proximal ACAs and MCAs are patent without stenosis or aneurysm. Distal branches are symmetric. Posterior circulation: Normal basilar artery. The SCAs, AICAs and PICAs are patent proximally. The PCAs are patent proximally without stenosis or aneurysm. Distal branches are symmetric. Venous sinuses: As permitted by contrast timing, patent. Anatomic variants: Persistent fetal origin of the bilateral PCAs with hypoplastic P1 segments bilaterally. Review of the MIP images confirms the above findings IMPRESSION: 1. 35 x 30 mm enhancing mass with surrounding vasogenic edema centered within the left caudate, suspicious for metastasis. Associated 13 mm of rightward midline shift and diffuse sulcal effacement. 2. No large vessel occlusion, hemodynamically significant stenosis, or evidence of dissection in the head or neck vessels. Electronically Signed   By: Orvan Falconer M.D.   On: 09/15/2022 12:24    Assessment/Plan This is a very pleasant 32 year old female with a history of stage IIb intraductal carcinoma treated with mastectomy and neoadjuvant chemotherapy in July 2023 who presents with several days of diplopia found to have 3.5 cm enhancing mass and surrounding vasogenic edema within the left caudate, suspicious for metastasis.  There is some associated 13 mm midline shift and diffuse sulcal effacement.  3.5 cm enhancing  brain mass-very concerning for metastatic disease -Inpatient admission -Received 10 mg IV Decadron in the emergency department, will continue 6 mg IV Decadron every 6 hours -Dr. Franky Macho of neurosurgery has been consulted, will see the patient tonight -I have sent a secure chat message to Dr. Karoline Caldwell of radiation oncology, requesting consultation  History of stage IIb intraductal carcinoma of the breast -Check CT of the chest abdomen pelvis with contrast -Check 3T MRI brain with and without contrast, this will be done at [facility] -Dr. Pamelia Hoit, her primary oncologist, has also been consulted  DVT prophylaxis: Lovenox     Code Status: Full Code  Consults called: Oncology, neurosurgery, radiation oncology as above  Admission status: Observation  Time spent: 46 minutes  Vasilios Ottaway Sharlette Dense MD [facility] Pager [phone]  If 7PM-7AM, please contact night-coverage [email or web address] Password [facility]  09/15/2022, 12:29 PM

## 2022-09-15 NOTE — ED Provider Notes (Signed)
[place] EMERGENCY DEPARTMENT AT [facility] Provider Note   CSN: [id number] Arrival date & time: 09/15/22  4098     History  Chief Complaint  Patient presents with   Eye Problem    Tara White is a 32 y.o. female.  Patient presents with 1 week history of double vision.  Patient has been having neck pain for over a month has been seeing a [facility].  Week and half ago after an adjustment she notes that she has had some diplopia.  States that it is not as bad as it is in the morning but gets worse throughout the day.  States that it is distance related.  The further away things are the worse it gets.  Notes that when she is only looking through 1 she has no issues.  Symptoms only are present when he tries look through both eyes.  Denies any headache with this.  No visual loss.  No peripheral weakness.  No ataxia.  Called her ophthalmologist and was told to come here       Home Medications Prior to Admission medications   Medication Sig Start Date End Date Taking? Authorizing Provider  carboxymethylcellulose (REFRESH TEARS) 0.5 % SOLN Place 1 drop into both eyes 3 (three) times daily as needed (Dry eye).    [provider]  Cholecalciferol (VITAMIN D3) 125 MCG (5000 UT) capsule Take 5,000 Units by mouth daily.    [provider]  Clindamycin-Benzoyl Per, Refr, gel Apply 1 Application topically 2 (two) times daily. 06/14/22   [provider]  Probiotic Product (PROBIOTIC PO) Take 1 tablet by mouth daily.    [provider]  traMADol (ULTRAM) 50 MG tablet Take 1 tablet (50 mg total) by mouth every 6 (six) hours as needed for moderate pain. 05/27/22   Abigail Miyamoto, MD      Allergies    Patient has no known allergies.    Review of Systems   Review of Systems  All other systems reviewed and are negative.   Physical Exam Updated Vital Signs BP 130/87   Pulse 60   Temp 97.8 F (36.6 C) (Oral)   Resp 17   Ht 1.753 m  (5\' 9" )   Wt 72.6 kg   LMP 09/08/2022   SpO2 100%   BMI 23.63 kg/m  Physical Exam Vitals and nursing note reviewed.  Constitutional:      General: She is not in acute distress.    Appearance: Normal appearance. She is well-developed. She is not toxic-appearing.  HENT:     Head: Normocephalic and atraumatic.  Eyes:     General: Lids are normal.     Conjunctiva/sclera: Conjunctivae normal.     Pupils: Pupils are equal, round, and reactive to light.  Neck:     Thyroid: No thyroid mass.     Trachea: No tracheal deviation.  Cardiovascular:     Rate and Rhythm: Normal rate and regular rhythm.     Heart sounds: Normal heart sounds. No murmur heard.    No gallop.  Pulmonary:     Effort: Pulmonary effort is normal. No respiratory distress.     Breath sounds: Normal breath sounds. No stridor. No decreased breath sounds, wheezing, rhonchi or rales.  Abdominal:     General: There is no distension.     Palpations: Abdomen is soft.     Tenderness: There is no abdominal tenderness. There is no rebound.  Musculoskeletal:        General:  No tenderness. Normal range of motion.     Cervical back: Normal range of motion and neck supple.  Skin:    General: Skin is warm and dry.     Findings: No abrasion or rash.  Neurological:     Mental Status: She is alert and oriented to person, place, and time. Mental status is at baseline.     GCS: GCS eye subscore is 4. GCS verbal subscore is 5. GCS motor subscore is 6.     Cranial Nerves: No cranial nerve deficit.     Sensory: No sensory deficit.     Motor: Motor function is intact.     Coordination: Coordination is intact.     Gait: Gait is intact.  Psychiatric:        Attention and Perception: Attention normal.        Speech: Speech normal.        Behavior: Behavior normal.     ED Results / Procedures / Treatments   Labs (all labs ordered are listed, but only abnormal results are displayed) Labs Reviewed  CBC WITH DIFFERENTIAL/PLATELET   I-STAT CHEM 8, ED    EKG None  Radiology No results found.  Procedures Procedures    Medications Ordered in ED Medications  lactated ringers infusion (has no administration in time range)    ED Course/ Medical Decision Making/ A&P                             Medical Decision Making Amount and/or Complexity of Data Reviewed Labs: ordered. Radiology: ordered. ECG/medicine tests: ordered.  Risk Prescription drug management.   Patient had a CT angio of her head and neck to rule out possible carotid dissection due to recent history of spinal manipulation.  Concern for also possible brain metastasis as patient does have history of cancer.  Patient's imaging unfortunately shows a very large tumor with mass effect.  Discussed with Dr. Franky Macho from neurosurgery who recommend patient receive Decadron and will see the patient in consultation.  Patient will be admitted to the hospital service.  Family at bedside and on the phone given update        Final Clinical Impression(s) / ED Diagnoses Final diagnoses:  None    Rx / DC Orders ED Discharge Orders     None         Lorre Nick, MD 09/15/22 1202

## 2022-09-15 NOTE — Progress Notes (Signed)
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   HISTORY OF PRESENT ILLNESS::Tara White is a 32 y.o. female who is seen today to discuss the role of radiation therapy in management of a recently discovered brain mass concerning for metastatic disease from a breast cancer primary. I last met with the patient on 02/09/22 for a 1 month follow-up of radiation to the right breast. She has remained without evidence of disease recurrence until her recent history which is detailed below.   The patient presented to the ED earlier today with c/o worsening diplopia x 1 week. She also reported having some pain at the base of her neck which began about a month ago, as well as some associated pressure in the front of her head which seems to worsen when she stands upright.   A CT of the head was performed today which revealed a 3.5 x 3.0 cm enhancing mass with surrounding vasogenic edema centered within the left caudate, suspicious for metastatic disease. Associated rightward midline shift measuring 1.3 cm and diffuse sulcal effacement were also appreciated. CT otherwise showed no evidence of large vessel occlusion, hemodynamically significant stenosis, or evidence of dissection in the head or neck vessels.  She was  subsequently admitted in light of CT findings. Hospital/ED course thus far has included IV Decadron at 10 mg in the ED. She is now continuing on 6 mg Decadron q 6 hours.  She also had a CT CAP performed today which showed no evidence of lymphadenopathy or metastatic disease in the chest, abdomen, or pelvis. Other findings included minimal scattered subpleural ground-glass of the anterior right upper lobe and right middle lobe and a minimal consolidation of the right apex. 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  I personally reviewed her images and shared the results with her  She is scheduled to undergo a 3T MRI of brain today.   Pertaining to her breast cancer, she continues to follow with plastic surgery and medical oncology and underwent implant placement on 06/25/22   PREVIOUS RADIATION THERAPY: Yes   Diagnoses: C50.411-Malignant neoplasm of upper-outer quadrant of right female breast  Intent: Curative Radiation Treatment Dates: 12/03/2021 through 01/11/2022 Site Technique Total Dose (Gy) Dose per Fx (Gy) Completed Fx Beam Energies  Chest Wall, Right: CW_R_IMN 3D 50.4/50.4 1.8 28/28 6XFFF  Chest Wall, Right: CW_R_PAB_SCV 3D 50.4/50.4 1.8 28/28 6X, 10X    PAST MEDICAL HISTORY:  has a past medical history of DVT (deep venous thrombosis) (HCC) (06/25/2021), Family history of breast cancer (04/21/2021), and right breast ca (03/2021).    PAST SURGICAL HISTORY: Past Surgical History:  Procedure Laterality Date   BREAST ENHANCEMENT SURGERY Left 06/25/2022   Procedure: MAMMOPLASTY AUGMENTATION, MASTOPEXY (BREAST);  Surgeon: Glenna Fellows, MD;  Location: [facility] OR;  Service: Plastics;  Laterality: Left;   BREAST RECONSTRUCTION WITH PLACEMENT OF TISSUE EXPANDER AND ALLODERM Right 10/13/2021   Procedure: RIGHT BREAST RECONSTRUCTION WITH PLACEMENT OF TISSUE EXPANDER AND ALLODERM;  Surgeon: Glenna Fellows, MD;  Location: [facility];  Service: Plastics;  Laterality: Right;   CESAREAN SECTION N/A 12/10/2015   Procedure: CESAREAN SECTION;  Surgeon: Noland Fordyce, MD;  Location: [facility];  Service: Obstetrics;  Laterality: N/A;   IR CV LINE INJECTION  06/25/2021   IR RADIOLOGY PERIPHERAL GUIDED IV START  06/25/2021   IR US GUIDE VASC ACCESS LEFT  06/25/2021   IR VENO/EXT/UNI LEFT  06/25/2021   NODE DISSECTION Right 10/13/2021   Procedure: TARGETED LYMPH NODE DISSECTION;  Surgeon: Abigail Miyamoto, MD;  Location: [facility];  Service: General;  Laterality: Right;   PORT-A-CATH REMOVAL Left 05/27/2022   Procedure: REMOVAL PORT-A-CATH;  Surgeon: Abigail Miyamoto, MD;  Location: [facility];  Service: General;  Laterality: Left;   PORTACATH PLACEMENT Left 04/30/2021   Procedure: INSERTION PORT-A-CATH;  Surgeon: Abigail Miyamoto, MD;  Location: [facility];  Service: General;  Laterality: Left;   REMOVAL OF TISSUE EXPANDER AND PLACEMENT OF IMPLANT Right 06/25/2022   Procedure: REMOVAL OF TISSUE EXPANDER AND PLACEMENT OF IMPLANT;  Surgeon: Glenna Fellows, MD;  Location: [facility];  Service: Plastics;  Laterality: Right;   SIMPLE MASTECTOMY WITH AXILLARY SENTINEL NODE BIOPSY Right 10/13/2021   Procedure: RIGHT MASTECTOMY;  Surgeon: Abigail Miyamoto, MD;  Location: [facility];  Service: General;  Laterality: Right;   WISDOM TOOTH EXTRACTION      FAMILY HISTORY: family history includes Breast cancer (age of onset: 11) in her maternal grandmother; Heart failure in her paternal grandfather; Hypothyroidism in her mother; Skin cancer in her maternal grandfather.  SOCIAL HISTORY:  reports that she has never smoked. She has never used smokeless tobacco. She reports current alcohol use of about 2.0 standard drinks of alcohol per week. She reports that she does not use drugs.  ALLERGIES: Patient has no known allergies.  MEDICATIONS:  No current facility-administered  medications for this encounter.   No current outpatient medications on file.   Facility-Administered Medications Ordered in Other Encounters  Medication Dose Route Frequency Provider Last Rate Last Admin   acetaminophen (TYLENOL) tablet 650 mg  650 mg Oral Q6H PRN Kirby Crigler, Mir M, MD       Or   acetaminophen (TYLENOL) suppository 650 mg  650 mg Rectal Q6H PRN Kirby Crigler, Mir M, MD       albuterol (PROVENTIL) (2.5 MG/3ML) 0.083% nebulizer solution 2.5 mg  2.5 mg Nebulization Q2H PRN Kirby Crigler, Mir M, MD       dexamethasone (DECADRON) injection 6 mg  6 mg Intravenous Q6H Kirby Crigler, Mir M, MD   6 mg at 09/15/22 1819   enoxaparin (LOVENOX) injection 40 mg  40 mg Subcutaneous Q24H Kirby Crigler, Mir M, MD   40 mg at 09/15/22 1819   lactated ringers infusion   Intravenous Continuous Lorre Nick, MD 125 mL/hr at 09/15/22 1018 New Bag at 09/15/22 1018   ondansetron (ZOFRAN) tablet 4 mg  4 mg Oral Q6H PRN Kirby Crigler, Mir M, MD       Or   ondansetron [facility]) injection 4 mg  4 mg Intravenous Q6H PRN Kirby Crigler, Mir M, MD       traZODone (DESYREL) tablet 25 mg  25 mg Oral QHS PRN Maryln Gottron, MD        REVIEW OF SYSTEMS:  As  above.   PHYSICAL EXAM:  vitals were not taken for this visit.   General: Alert and oriented, in no acute distress - closes an eye at times to focus HEENT: Head is normocephalic. Extraocular movements are intact. Oropharynx is clear. Musculoskeletal: symmetric strength and muscle tone with exception to subtle weakness, left arm Neurologic: Double vision. Speech is fluent. Facial symmetry noted. Psychiatric: Judgment and insight are intact. Affect is appropriate.  KPS = 80  100 - Normal; no complaints; no evidence of disease. 90   - Able to carry on normal activity; minor signs or symptoms of disease. 80   - Normal activity with effort; some signs or symptoms of disease. 67   - Cares for self; unable to carry on normal activity or to do active work. 60   -  Requires occasional assistance, but is able to care for most of his personal needs. 50   - Requires considerable assistance and frequent medical care. 40   - Disabled; requires special care and assistance. 30   - Severely disabled; hospital admission is indicated although death not imminent. 20   - Very sick; hospital admission necessary; active supportive treatment necessary. 10   - Moribund; fatal processes progressing rapidly. 0     - Dead  Karnofsky DA, Abelmann WH, Craver LS and [place] [phone]) The use of the nitrogen mustards in the palliative treatment of carcinoma: with particular reference to bronchogenic carcinoma Cancer 1 [id number]   LABORATORY DATA:  Lab Results  Component Value Date   WBC 6.5 09/15/2022   HGB 14.3 09/15/2022   HCT 42.0 09/15/2022   MCV 91.2 09/15/2022   PLT 212 09/15/2022   CMP     Component Value Date/Time   NA 139 09/15/2022 1025   K 4.3 09/15/2022 1025   CL 106 09/15/2022 1025   CO2 27 04/21/2022 1103   GLUCOSE 91 09/15/2022 1025   BUN 18 09/15/2022 1025   CREATININE 1.00 09/15/2022 1025   CREATININE 0.95 04/21/2022 1103   CALCIUM 9.1 04/21/2022 1103   PROT 7.1 04/21/2022 1103   ALBUMIN 4.0 04/21/2022 1103   AST 19 04/21/2022 1103   ALT 16 04/21/2022 1103   ALKPHOS 80 04/21/2022 1103   BILITOT 0.5 04/21/2022 1103   GFRNONAA >60 04/21/2022 1103         RADIOGRAPHY: CT CHEST ABDOMEN PELVIS W CONTRAST  Result Date: 09/15/2022 CLINICAL DATA:  Metastatic disease evaluation, primary search, brain mass, history of breast cancer * Tracking Code: BO * EXAM: CT CHEST, ABDOMEN, AND PELVIS WITH CONTRAST TECHNIQUE: Multidetector CT imaging of the chest, abdomen and pelvis was performed following the standard protocol during bolus administration of intravenous contrast. RADIATION DOSE REDUCTION: This exam was performed according to the departmental dose-optimization program which includes automated exposure control, adjustment of the mA and/or kV  according to patient size and/or use of iterative reconstruction technique. CONTRAST:  80mL OMNIPAQUE IOHEXOL 300 MG/ML SOLN additional oral enteric contrast COMPARISON:  CT chest abdomen pelvis, 04/27/2021 FINDINGS: CT CHEST FINDINGS Cardiovascular: No significant vascular findings. Normal heart size. No pericardial effusion. Mediastinum/Nodes: Status post right axillary lymph node dissection. No enlarged mediastinal, hilar, or axillary lymph nodes. Thymic remnant in the anterior mediastinum. Thyroid gland, trachea, and esophagus demonstrate no significant findings. Lungs/Pleura: Minimal scattered subpleural ground-glass of the anterior right upper lobe and right middle lobe (series 6, image 95). Minimal consolidation of the right apex (series 6, image 16). No pleural effusion or pneumothorax. Musculoskeletal: Status post right mastectomy with  implant reconstruction and left prepectoral breast implant. No acute osseous findings. CT ABDOMEN PELVIS FINDINGS Hepatobiliary: No solid liver abnormality is seen. No gallstones, gallbladder wall thickening, or biliary dilatation. Pancreas: Unremarkable. No pancreatic ductal dilatation or surrounding inflammatory changes. Spleen: Normal in size without significant abnormality. Adrenals/Urinary Tract: Adrenal glands are unremarkable. Kidneys are normal, without renal calculi, solid lesion, or hydronephrosis. Excreted contrast in the urinary bladder. Stomach/Bowel: Stomach is within normal limits. Appendix not clearly visualized. No evidence of bowel wall thickening, distention, or inflammatory changes. Moderate burden of stool throughout the colon. Vascular/Lymphatic: No significant vascular findings are present. No enlarged abdominal or pelvic lymph nodes. Reproductive: No mass or other abnormality. Other: No abdominal wall hernia or abnormality. No ascites. Musculoskeletal: No acute osseous findings. IMPRESSION: 1. Status post right mastectomy with implant reconstruction  and left prepectoral breast implant. 2. No evidence of lymphadenopathy or metastatic disease in the chest, abdomen, or pelvis. 3. Minimal scattered subpleural ground-glass of the anterior right upper lobe and right middle lobe. Minimal consolidation of the right apex. 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 Electronically Signed   By: Jearld Lesch M.D.   On: 09/15/2022 14:09   CT ANGIO HEAD NECK W WO CM  Result Date: 09/15/2022 CLINICAL DATA:  Diplopia. EXAM: CT ANGIOGRAPHY HEAD AND NECK WITH AND WITHOUT CONTRAST TECHNIQUE: Multidetector CT imaging of the head and neck was performed using the standard protocol during bolus administration of intravenous contrast. Multiplanar CT image reconstructions and MIPs were obtained to evaluate the vascular anatomy. Carotid stenosis measurements (when applicable) are obtained utilizing NASCET criteria, using the distal internal carotid diameter as the denominator. RADIATION DOSE REDUCTION: This exam was performed according to the departmental dose-optimization program which includes automated exposure control, adjustment of the mA and/or kV according to patient size and/or use of iterative reconstruction technique. CONTRAST:  75mL OMNIPAQUE IOHEXOL 350 MG/ML SOLN COMPARISON:  None Available. FINDINGS: CT HEAD FINDINGS Brain: 35 x 30 mm enhancing mass with surrounding vasogenic edema centered within the left caudate (axial image 15 series 5, image 16 series 21), suspicious for metastasis. Cortical gray-white differentiation is preserved. No acute hemorrhage. Associated 13 mm of rightward midline shift and diffuse sulcal effacement. Vascular: No hyperdense vessel or unexpected calcification. Skull: No calvarial fracture or suspicious bone lesion. Skull base is unremarkable. Sinuses/Orbits: Unremarkable. Other: None. Review of the MIP images confirms the above findings CTA NECK FINDINGS Aortic arch: Three-vessel arch configuration.  Arch vessel origins are patent. Right carotid system: No evidence of dissection, stenosis (50% or greater), or occlusion. Left carotid system: No evidence of dissection, stenosis (50% or greater), or occlusion. Vertebral arteries: Left dominant. No evidence of dissection, stenosis (50% or greater), or occlusion. Skeleton: Unremarkable. Other neck: Unremarkable. Upper chest: Unremarkable. Review of the MIP images confirms the above findings CTA HEAD FINDINGS Anterior circulation: Intracranial ICAs are patent without stenosis or aneurysm. The proximal ACAs and MCAs are patent without stenosis or aneurysm. Distal branches are symmetric. Posterior circulation: Normal basilar artery. The SCAs, AICAs and PICAs are patent proximally. The PCAs are patent proximally without stenosis or aneurysm. Distal branches are symmetric. Venous sinuses: As permitted by contrast timing, patent. Anatomic variants: Persistent fetal origin of the bilateral PCAs with hypoplastic P1 segments bilaterally. Review of the MIP images confirms the above findings IMPRESSION: 1. 35 x 30 mm enhancing mass with surrounding vasogenic edema centered within the left caudate, suspicious for metastasis. Associated 13 mm of rightward midline shift and diffuse sulcal effacement.  2. No large vessel occlusion, hemodynamically significant stenosis, or evidence of dissection in the head or neck vessels. Electronically Signed   By: Orvan Falconer M.D.   On: 09/15/2022 12:24      IMPRESSION/PLAN: This is a wonderful 32 year old woman (breast cancer survivor) well known to me with suspected metastatic disease to the brain.  I had a lengthy discussion with the patient after reviewing their MRI results with them.  We spoke about whole brain radiotherapy versus stereotactic radiosurgery to the brain. We spoke about the differing risks benefits and side effects of both of these treatments. During part of our discussion, we spoke about the hair loss, fatigue and  cognitive effects that can result from whole brain radiotherapy.  Additionally, we spoke about radionecrosis that can result from stereotactic radiosurgery. I explained that whole brain radiotherapy is more comprehensive and therefore can decrease the chance of recurrences elsewhere in the brain, while stereotactic radiosurgery only treats the areas of gross disease while sparing the rest of the brain parenchyma.  After lengthy discussion, the patient would like to proceed with stereotactic brain radiosurgery to their metastatic disease if possible, but MRI brain is pending to stage her brain accurately. They will meet with neurosurgery in the near future to discuss her condition and possible interventions.  I will see her after her MRI to help finalize her treatment plan. Emotional support given today. She is a resilient woman with strong faith, and her husband is very supportive and loving.    On date of service, in total, I spent 45 minutes on this encounter. Patient was seen in person.   __________________________________________   Lonie Peak, MD  This document serves as a record of services personally performed by Lonie Peak, MD. It was created on her behalf by Neena Rhymes, a trained medical scribe. The creation of this record is based on the scribe's personal observations and the provider's statements to them. This document has been checked and approved by the attending provider.

## 2022-09-15 NOTE — Consult Note (Signed)
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  Presented with diplopia looking to the left x1 week, headaches since May. Head CT revealed a large spherical enhancing mass in the left caudate with robust edema in the surrounding brain. MRI shows same, no other lesions identified. Chest, abd, pelvis CT revealed no other disease.  No Known Allergies Past Medical History:  Diagnosis Date   DVT (deep venous thrombosis) (HCC) 06/25/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   Family history of breast cancer 04/21/2021   right breast ca 03/2021   Past Surgical History:  Procedure Laterality Date   BREAST ENHANCEMENT SURGERY Left 06/25/2022   Procedure: MAMMOPLASTY AUGMENTATION, MASTOPEXY (BREAST);  Surgeon: Glenna Fellows, MD;  Location: [facility];  Service: Plastics;  Laterality: Left;   BREAST RECONSTRUCTION WITH PLACEMENT OF TISSUE EXPANDER AND ALLODERM Right 10/13/2021   Procedure: RIGHT BREAST RECONSTRUCTION WITH PLACEMENT OF TISSUE EXPANDER AND ALLODERM;  Surgeon: Glenna Fellows, MD;  Location: [facility];  Service: Plastics;  Laterality: Right;   CESAREAN SECTION N/A 12/10/2015   Procedure: CESAREAN SECTION;  Surgeon: Noland Fordyce, MD;  Location: [facility];  Service: Obstetrics;  Laterality: N/A;   IR CV LINE INJECTION  06/25/2021   IR RADIOLOGY PERIPHERAL GUIDED IV START  06/25/2021   IR US GUIDE VASC ACCESS LEFT  06/25/2021   IR VENO/EXT/UNI LEFT  06/25/2021   NODE DISSECTION Right 10/13/2021   Procedure: TARGETED LYMPH NODE DISSECTION;  Surgeon:  Abigail Miyamoto, MD;  Location: [facility];  Service: General;  Laterality: Right;   PORT-A-CATH REMOVAL Left 05/27/2022   Procedure: REMOVAL PORT-A-CATH;  Surgeon: Abigail Miyamoto, MD;  Location: [facility];  Service: General;  Laterality: Left;   PORTACATH PLACEMENT Left 04/30/2021   Procedure: INSERTION PORT-A-CATH;  Surgeon: Abigail Miyamoto, MD;  Location: [facility];  Service: General;  Laterality: Left;   REMOVAL OF TISSUE EXPANDER AND PLACEMENT OF IMPLANT Right 06/25/2022   Procedure: REMOVAL OF TISSUE EXPANDER AND PLACEMENT OF IMPLANT;  Surgeon: Glenna Fellows, MD;  Location: [facility];  Service: Plastics;  Laterality: Right;   SIMPLE MASTECTOMY WITH AXILLARY SENTINEL NODE BIOPSY Right 10/13/2021   Procedure: RIGHT MASTECTOMY;  Surgeon: Abigail Miyamoto, MD;  Location:  SURGERY CENTER;  Service: General;  Laterality: Right;   WISDOM TOOTH EXTRACTION     Family History  Problem Relation Age of Onset   Hypothyroidism Mother    Breast cancer Maternal Grandmother 18   Skin cancer Maternal Grandfather    Heart failure Paternal Grandfather    Social History   Socioeconomic History   Marital status: Married    Spouse name: Not on file   Number of children: Not on file   Years of education: Not on file   Highest education level: Not on file  Occupational History   Not on file  Tobacco Use   Smoking status: Never   Smokeless tobacco: Never  Vaping Use   Vaping Use: Not on  file  Substance and Sexual Activity   Alcohol use: Yes    Alcohol/week: 2.0 standard drinks of alcohol    Types: 2 Standard drinks or equivalent per week    Comment: rarely   Drug use: No   Sexual activity: Not Currently    Partners: Male    Birth control/protection: Other-see comments    Comment: cycles have not resumed since receiving chemotherapy  Other Topics Concern   Not on file  Social History Narrative   Not on file   Social Determinants  of Health   Financial Resource Strain: Not on file  Food Insecurity: No Food Insecurity (09/15/2022)   Hunger Vital Sign    Worried About Running Out of Food in the Last Year: Never true    Ran Out of Food in the Last Year: Never true  Transportation Needs: No Transportation Needs (09/15/2022)   PRAPARE - Administrator, Civil Service (Medical): No    Lack of Transportation (Non-Medical): No  Physical Activity: Not on file  Stress: Not on file  Social Connections: Not on file  Intimate Partner Violence: Not At Risk (09/15/2022)   Humiliation, Afraid, Rape, and Kick questionnaire    Fear of Current or Ex-Partner: No    Emotionally Abused: No    Physically Abused: No    Sexually Abused: No   Prior to Admission medications   Medication Sig Start Date End Date Taking? Authorizing Provider  carboxymethylcellulose (REFRESH TEARS) 0.5 % SOLN Place 1 drop into both eyes 3 (three) times daily as needed (Dry eye).   Yes [provider]  traMADol (ULTRAM) 50 MG tablet Take 1 tablet (50 mg total) by mouth every 6 (six) hours as needed for moderate pain. Patient not taking: Reported on 09/15/2022 05/27/22   Abigail Miyamoto, MD  MR BRAIN W WO CONTRAST  Result Date: 09/15/2022 CLINICAL DATA:  Brain metastases, unknown primary EXAM: MRI HEAD WITHOUT AND WITH CONTRAST TECHNIQUE: Multiplanar, multiecho pulse sequences of the brain and surrounding structures were obtained without and with intravenous contrast. CONTRAST:  7mL GADAVIST GADOBUTROL 1 MMOL/ML IV SOLN COMPARISON:  Same-day CT head and neck angiogram FINDINGS: Brain: There is a 3.4 x 2.5 x 3.3 cm contrast-enhancing mass centered around the left caudate head. Contrast enhancement extends along the ependymal margin of the left frontal horn (series 1100, image 193). There is extensive vasogenic edema surrounding this mass involving a large portion of the left frontal lobe, extending along the cortical spinal tracts into the cerebral  peduncles, and medially across midline along the genu of the corpus callosum (series 4, image 29). There is asymmetric enlargement of the right temporal horn with periventricular T2/FLAIR hyperintense signal abnormality, which raises the possibility of ventricular entrapment. Negative for an acute infarct. No hemorrhage. No extra-axial fluid collection. Vascular: Normal flow voids. Skull and upper cervical spine: Normal marrow signal. Sinuses/Orbits: Negative. Other: None. IMPRESSION: 1. There is a 3.4 x 2.5 x 3.3 cm contrast-enhancing mass centered around the left caudate head with extensive surrounding vasogenic edema, as above. Contrast enhancement extends along the ependymal margin of the left frontal horn. Given patient's history, metastatic disease is a differential consideration. However, this degree of T2/FLAIR signal abnormality is somewhat atypical for metastatic disease, particularly infiltration along the corticospinal tracts, as well as across the genu of the corpus callosum; an additional differential consideration is a high grade glioma. 2. Asymmetric enlargement of the right temporal horn with periventricular T2/FLAIR hyperintense signal abnormality, which raises the possibility of  ventricular entrapment. Electronically Signed   By: Lorenza Cambridge M.D.   On: 09/15/2022 19:01   CT CHEST ABDOMEN PELVIS W CONTRAST  Result Date: 09/15/2022 CLINICAL DATA:  Metastatic disease evaluation, primary search, brain mass, history of breast cancer * Tracking Code: BO * EXAM: CT CHEST, ABDOMEN, AND PELVIS WITH CONTRAST TECHNIQUE: Multidetector CT imaging of the chest, abdomen and pelvis was performed following the standard protocol during bolus administration of intravenous contrast. RADIATION DOSE REDUCTION: This exam was performed according to the departmental dose-optimization program which includes automated exposure control, adjustment of the mA and/or kV according to patient size and/or use of iterative  reconstruction technique. CONTRAST:  80mL OMNIPAQUE IOHEXOL 300 MG/ML SOLN additional oral enteric contrast COMPARISON:  CT chest abdomen pelvis, 04/27/2021 FINDINGS: CT CHEST FINDINGS Cardiovascular: No significant vascular findings. Normal heart size. No pericardial effusion. Mediastinum/Nodes: Status post right axillary lymph node dissection. No enlarged mediastinal, hilar, or axillary lymph nodes. Thymic remnant in the anterior mediastinum. Thyroid gland, trachea, and esophagus demonstrate no significant findings. Lungs/Pleura: Minimal scattered subpleural ground-glass of the anterior right upper lobe and right middle lobe (series 6, image 95). Minimal consolidation of the right apex (series 6, image 16). No pleural effusion or pneumothorax. Musculoskeletal: Status post right mastectomy with implant reconstruction and left prepectoral breast implant. No acute osseous findings. CT ABDOMEN PELVIS FINDINGS Hepatobiliary: No solid liver abnormality is seen. No gallstones, gallbladder wall thickening, or biliary dilatation. Pancreas: Unremarkable. No pancreatic ductal dilatation or surrounding inflammatory changes. Spleen: Normal in size without significant abnormality. Adrenals/Urinary Tract: Adrenal glands are unremarkable. Kidneys are normal, without renal calculi, solid lesion, or hydronephrosis. Excreted contrast in the urinary bladder. Stomach/Bowel: Stomach is within normal limits. Appendix not clearly visualized. No evidence of bowel wall thickening, distention, or inflammatory changes. Moderate burden of stool throughout the colon. Vascular/Lymphatic: No significant vascular findings are present. No enlarged abdominal or pelvic lymph nodes. Reproductive: No mass or other abnormality. Other: No abdominal wall hernia or abnormality. No ascites. Musculoskeletal: No acute osseous findings. IMPRESSION: 1. Status post right mastectomy with implant reconstruction and left prepectoral breast implant. 2. No evidence  of lymphadenopathy or metastatic disease in the chest, abdomen, or pelvis. 3. Minimal scattered subpleural ground-glass of the anterior right upper lobe and right middle lobe. Minimal consolidation of the right apex. 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 Electronically Signed   By: Jearld Lesch M.D.   On: 09/15/2022 14:09   CT ANGIO HEAD NECK W WO CM  Result Date: 09/15/2022 CLINICAL DATA:  Diplopia. EXAM: CT ANGIOGRAPHY HEAD AND NECK WITH AND WITHOUT CONTRAST TECHNIQUE: Multidetector CT imaging of the head and neck was performed using the standard protocol during bolus administration of intravenous contrast. Multiplanar CT image reconstructions and MIPs were obtained to evaluate the vascular anatomy. Carotid stenosis measurements (when applicable) are obtained utilizing NASCET criteria, using the distal internal carotid diameter as the denominator. RADIATION DOSE REDUCTION: This exam was performed according to the departmental dose-optimization program which includes automated exposure control, adjustment of the mA and/or kV according to patient size and/or use of iterative reconstruction technique. CONTRAST:  75mL OMNIPAQUE IOHEXOL 350 MG/ML SOLN COMPARISON:  None Available. FINDINGS: CT HEAD FINDINGS Brain: 35 x 30 mm enhancing mass with surrounding vasogenic edema centered within the left caudate (axial image 15 series 5, image 16 series 21), suspicious for metastasis. Cortical gray-white differentiation is preserved. No acute hemorrhage. Associated 13 mm of rightward midline shift and diffuse sulcal effacement. Vascular: No  hyperdense vessel or unexpected calcification. Skull: No calvarial fracture or suspicious bone lesion. Skull base is unremarkable. Sinuses/Orbits: Unremarkable. Other: None. Review of the MIP images confirms the above findings CTA NECK FINDINGS Aortic arch: Three-vessel arch configuration. Arch vessel origins are patent. Right carotid system:  No evidence of dissection, stenosis (50% or greater), or occlusion. Left carotid system: No evidence of dissection, stenosis (50% or greater), or occlusion. Vertebral arteries: Left dominant. No evidence of dissection, stenosis (50% or greater), or occlusion. Skeleton: Unremarkable. Other neck: Unremarkable. Upper chest: Unremarkable. Review of the MIP images confirms the above findings CTA HEAD FINDINGS Anterior circulation: Intracranial ICAs are patent without stenosis or aneurysm. The proximal ACAs and MCAs are patent without stenosis or aneurysm. Distal branches are symmetric. Posterior circulation: Normal basilar artery. The SCAs, AICAs and PICAs are patent proximally. The PCAs are patent proximally without stenosis or aneurysm. Distal branches are symmetric. Venous sinuses: As permitted by contrast timing, patent. Anatomic variants: Persistent fetal origin of the bilateral PCAs with hypoplastic P1 segments bilaterally. Review of the MIP images confirms the above findings IMPRESSION: 1. 35 x 30 mm enhancing mass with surrounding vasogenic edema centered within the left caudate, suspicious for metastasis. Associated 13 mm of rightward midline shift and diffuse sulcal effacement. 2. No large vessel occlusion, hemodynamically significant stenosis, or evidence of dissection in the head or neck vessels. Electronically Signed   By: Orvan Falconer M.D.   On: 09/15/2022 12:24    Physical Exam Constitutional:      Appearance: Normal appearance. She is normal weight.  HENT:     Head: Normocephalic and atraumatic.     Right Ear: External ear normal.     Left Ear: External ear normal.     Nose: Nose normal.     Mouth/Throat:     Mouth: Mucous membranes are moist.     Pharynx: Oropharynx is clear.  Eyes:     Extraocular Movements: Extraocular movements intact.     Pupils: Pupils are equal, round, and reactive to light.     Comments: Left lateral gaze diplopia  Cardiovascular:     Rate and Rhythm: Normal  rate and regular rhythm.  Pulmonary:     Effort: Pulmonary effort is normal.  Abdominal:     General: Abdomen is flat.     Palpations: Abdomen is soft.  Musculoskeletal:        General: Normal range of motion.     Cervical back: Normal range of motion and neck supple.  Skin:    General: Skin is warm and dry.  Neurological:     Mental Status: She is alert and oriented to person, place, and time.     Cranial Nerves: Cranial nerve deficit present.     Sensory: No sensory deficit.     Motor: No weakness.     Coordination: Coordination normal.     Deep Tendon Reflexes: Reflexes normal. Babinski sign absent on the right side. Babinski sign absent on the left side.  Psychiatric:        Mood and Affect: Mood normal.        Behavior: Behavior normal.        Thought Content: Thought content normal.        Judgment: Judgment normal.   No drift. Normal coordination upper extremities.  Proprioception is intact upper and lower extremities Normal strength  Has been seen by Rad Onc. Do not believe lesion is resectable without causing significant morbidity. Believe she is great candidate for radiosurgical  treatment.  I will speak with the oncology and radiation oncology service. This is most likely metastatic disease. Do not believe biopsy is indicated.

## 2022-09-15 NOTE — Telephone Encounter (Signed)
Received call from pt stating she is currently in the [facility] ED with severe blurry/ double vision.  Pt requesting MD to order brain MRI while pt is in ED.  Per MD, ordering is not appropriate at this time and pt needing to follow protocol of ED provider. Pt verbalized understanding.

## 2022-09-16 DIAGNOSIS — G9389 Other specified disorders of brain: Secondary | ICD-10-CM | POA: Diagnosis not present

## 2022-09-16 MED ORDER — DEXAMETHASONE 4 MG PO TABS: 4.0000 mg | ORAL_TABLET | Freq: Two times a day (BID) | ORAL | 0 refills | Status: DC

## 2022-09-16 MED ORDER — LACTATED RINGERS IV SOLN: INTRAVENOUS | Status: DC

## 2022-09-16 NOTE — Progress Notes (Signed)
Hematology-oncology progress note  Chief complaint: Diplopia HPI: Tara White is a 32 year old with history of HER2 positive breast cancer that was diagnosed in January 2023 and she was treated with neoadjuvant chemotherapy with [facility] and had a pathologic complete response and underwent right mastectomy followed by adjuvant radiation.  She completed a year of Herceptin and Perjeta.  She came to the hospital with sudden onset of diplopia.  Brain MRI revealed 3.74 cm mass in the left cardiac lobe with vasogenic edema.  She was evaluated by neurosurgery and radiation oncology.  Surgery is felt to be not an option and therefore she is being set up for stereotactic radiation.  Assessment and plan: Solitary brain metastases: Patient is being scheduled for stereotactic radiation.  She requested a consultation with [facility] brain oncology team.  I sent a message to Dr. Heide Spark to see her for consultation. Dexamethasone 4 mg p.o. twice daily for discharge. CT CAP: No evidence of distant metastatic disease elsewhere in the body. We will discuss the role of small molecule inhibitors to prevent further brain metastases.

## 2022-09-16 NOTE — Progress Notes (Signed)
PROGRESS NOTE    Tara White  ZOX:[id number] DOB: Dec 28, 1990 DOA: 09/15/2022 PCP: Samuella Bruin   Brief Narrative:  32 year old with history of right-sided stage IIb intraductal carcinoma and treated with mastectomy in July 2023 and completed neoadjuvant chemotherapy earlier this year admitted to the hospital with 1 week of diplopia.  Patient was found to have 3 cm enhancing mass with vasogenic edema in the left caudate suspicious for metastases.  She also reported of some frontal headache especially with standing up.  Neurosurgery was consulted and patient was started on Decadron.  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  Currently on IV steroids.  Neurosurgery following the patient.  Radiation oncology also consulted with plans of whole brain radiation versus stereotactic.  Dr. Pamelia Hoit consulted as well    DVT prophylaxis: Lovenox Code Status: Full  Family Communication:   Continue IV steroids and hospital stay      Diet Orders (From admission, onward)     Start     Ordered   09/15/22 1812  Diet regular Fluid consistency: Thin  Diet effective now       Question:  Fluid consistency:  Answer:  Thin   09/15/22 1811            Subjective: Doing ok, still having diplopia.    Examination:  General exam: Appears calm and comfortable  Respiratory system: Clear to auscultation. Respiratory effort normal. Cardiovascular system: S1 & S2 heard, RRR. No JVD, murmurs, rubs, gallops or clicks. No pedal edema. Gastrointestinal system: Abdomen is nondistended, soft and nontender. No organomegaly or masses felt. Normal bowel sounds heard. Central nervous system: Alert and oriented. No focal neurological deficits. +  Diplopia Extremities: Symmetric 5 x 5 power. Skin: No rashes, lesions or ulcers Psychiatry: Judgement and insight appear normal. Mood & affect appropriate.  Objective: Vitals:   09/15/22 0930 09/15/22 1351 09/15/22 2255 09/16/22 0250  BP: 130/87 135/87 117/72 110/72  Pulse: 60 (!) 53 (!) 55 (!) 57  Resp: 17 18 18 18   Temp: 97.8 F (36.6 C) 97.7 F (36.5 C) 98.1 F (36.7 C) 98.1 F (36.7 C)  TempSrc: Oral Oral Oral Oral  SpO2: 100% 100% 100% 99%  Weight: 72.6 kg     Height: 5\' 9"  (1.753 m)       Intake/Output Summary (Last 24 hours) at 09/16/2022 0805 Last data filed at 09/16/2022 0600 Gross per 24 hour  Intake 2439.38 ml  Output --  Net 2439.38 ml   Filed Weights   09/15/22 0930  Weight: 72.6 kg    Scheduled Meds:  dexamethasone (DECADRON) injection  6 mg Intravenous Q6H   enoxaparin (LOVENOX) injection  40 mg Subcutaneous Q24H   Continuous Infusions:  lactated ringers 125 mL/hr at 09/16/22 0553    Nutritional status     Body mass index is 23.63 kg/m.  Data Reviewed:   CBC: Recent Labs  Lab 09/15/22 1010 09/15/22 1025  WBC 6.5  --   NEUTROABS 4.3  --   HGB 14.2 14.3  HCT 41.6 42.0  MCV 91.2  --   PLT 212  --    Basic Metabolic Panel: Recent Labs  Lab 09/15/22 1025  NA 139  K 4.3  CL 106  GLUCOSE 91  BUN 18  CREATININE 1.00   GFR: Estimated Creatinine Clearance: 83.6 mL/min (by C-G formula based on SCr of 1 mg/dL). Liver Function Tests: No results for input(s): "AST", "ALT", "ALKPHOS", "BILITOT", "PROT", "ALBUMIN" in the last 168 hours. No results for input(s): "LIPASE", "AMYLASE" in the last 168 hours. No results for input(s): "AMMONIA" in the last 168 hours. Coagulation Profile: No results for input(s): "INR", "PROTIME" in the last 168 hours. Cardiac Enzymes: No results for input(s): "CKTOTAL", "CKMB", "CKMBINDEX", "TROPONINI" in the last 168 hours. BNP (last 3 results) No results for input(s): "PROBNP" in the last 8760  hours. HbA1C: No results for input(s): "HGBA1C" in the last 72 hours. CBG: No results for input(s): "GLUCAP" in the last 168 hours. Lipid Profile: No results for input(s): "CHOL", "HDL", "LDLCALC", "TRIG", "CHOLHDL", "LDLDIRECT" in the last 72 hours. Thyroid Function Tests: No results for input(s): "TSH", "T4TOTAL", "FREET4", "T3FREE", "THYROIDAB" in the last 72 hours. Anemia Panel: No results for input(s): "VITAMINB12", "FOLATE", "FERRITIN", "TIBC", "IRON", "RETICCTPCT" in the last 72 hours. Sepsis Labs: No results for input(s): "PROCALCITON", "LATICACIDVEN" in the last 168 hours.  No results found for this or any previous visit (from the past 240 hour(s)).       Radiology Studies: MR BRAIN W WO CONTRAST  Result Date: 09/15/2022 CLINICAL DATA:  Brain metastases, unknown primary EXAM: MRI HEAD WITHOUT AND WITH CONTRAST TECHNIQUE: Multiplanar, multiecho pulse sequences of the brain and surrounding structures were obtained without and with intravenous contrast. CONTRAST:  7mL GADAVIST GADOBUTROL 1 MMOL/ML IV SOLN COMPARISON:  Same-day CT head and neck angiogram FINDINGS: Brain: There is a 3.4 x 2.5 x 3.3 cm contrast-enhancing mass centered around the left caudate head. Contrast enhancement extends along the ependymal margin of the left frontal horn (series 1100, image 193). There is extensive vasogenic edema surrounding this mass involving a large portion of the left frontal lobe, extending along the cortical spinal tracts into the cerebral peduncles, and medially across midline along the genu of the corpus callosum (series 4, image 29). There is asymmetric enlargement of the right temporal horn with periventricular T2/FLAIR hyperintense signal abnormality, which raises the possibility of ventricular entrapment. Negative for an acute infarct. No hemorrhage. No extra-axial fluid collection. Vascular: Normal flow voids. Skull and upper cervical spine: Normal marrow signal. Sinuses/Orbits:  Negative. Other: None. IMPRESSION: 1. There is a 3.4 x 2.5 x 3.3 cm contrast-enhancing mass centered around the left caudate head with extensive surrounding vasogenic edema, as above. Contrast enhancement extends along the ependymal margin of the left frontal horn. Given patient's history, metastatic disease is a differential consideration. However, this degree of T2/FLAIR signal abnormality is somewhat atypical for metastatic disease, particularly infiltration along the corticospinal tracts, as well as across the genu of the corpus callosum; an additional differential consideration is a high grade glioma. 2. Asymmetric enlargement of the right temporal horn with periventricular T2/FLAIR hyperintense signal abnormality, which raises the possibility of ventricular entrapment. Electronically Signed   By: Lorenza Cambridge M.D.   On: 09/15/2022 19:01   CT CHEST ABDOMEN PELVIS W CONTRAST  Result Date: 09/15/2022 CLINICAL DATA:  Metastatic disease evaluation, primary search, brain mass, history of breast cancer * Tracking Code: BO * EXAM: CT CHEST, ABDOMEN, AND PELVIS WITH CONTRAST TECHNIQUE: Multidetector CT imaging of the chest, abdomen and pelvis was performed following the standard protocol during bolus administration of intravenous contrast. RADIATION DOSE REDUCTION: This exam was performed according to the departmental dose-optimization program which includes automated exposure control, adjustment of the  mA and/or kV according to patient size and/or use of iterative reconstruction technique. CONTRAST:  80mL OMNIPAQUE IOHEXOL 300 MG/ML SOLN additional oral enteric contrast COMPARISON:  CT chest abdomen pelvis, 04/27/2021 FINDINGS: CT CHEST FINDINGS Cardiovascular: No significant vascular findings. Normal heart size. No pericardial effusion. Mediastinum/Nodes: Status post right axillary lymph node dissection. No enlarged mediastinal, hilar, or axillary lymph nodes. Thymic remnant in the anterior mediastinum. Thyroid  gland, trachea, and esophagus demonstrate no significant findings. Lungs/Pleura: Minimal scattered subpleural ground-glass of the anterior right upper lobe and right middle lobe (series 6, image 95). Minimal consolidation of the right apex (series 6, image 16). No pleural effusion or pneumothorax. Musculoskeletal: Status post right mastectomy with implant reconstruction and left prepectoral breast implant. No acute osseous findings. CT ABDOMEN PELVIS FINDINGS Hepatobiliary: No solid liver abnormality is seen. No gallstones, gallbladder wall thickening, or biliary dilatation. Pancreas: Unremarkable. No pancreatic ductal dilatation or surrounding inflammatory changes. Spleen: Normal in size without significant abnormality. Adrenals/Urinary Tract: Adrenal glands are unremarkable. Kidneys are normal, without renal calculi, solid lesion, or hydronephrosis. Excreted contrast in the urinary bladder. Stomach/Bowel: Stomach is within normal limits. Appendix not clearly visualized. No evidence of bowel wall thickening, distention, or inflammatory changes. Moderate burden of stool throughout the colon. Vascular/Lymphatic: No significant vascular findings are present. No enlarged abdominal or pelvic lymph nodes. Reproductive: No mass or other abnormality. Other: No abdominal wall hernia or abnormality. No ascites. Musculoskeletal: No acute osseous findings. IMPRESSION: 1. Status post right mastectomy with implant reconstruction and left prepectoral breast implant. 2. No evidence of lymphadenopathy or metastatic disease in the chest, abdomen, or pelvis. 3. Minimal scattered subpleural ground-glass of the anterior right upper lobe and right middle lobe. Minimal consolidation of the right apex. 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 Electronically Signed   By: Jearld Lesch M.D.   On: 09/15/2022 14:09   CT ANGIO HEAD NECK W WO CM  Result Date: 09/15/2022 CLINICAL DATA:  Diplopia. EXAM:  CT ANGIOGRAPHY HEAD AND NECK WITH AND WITHOUT CONTRAST TECHNIQUE: Multidetector CT imaging of the head and neck was performed using the standard protocol during bolus administration of intravenous contrast. Multiplanar CT image reconstructions and MIPs were obtained to evaluate the vascular anatomy. Carotid stenosis measurements (when applicable) are obtained utilizing NASCET criteria, using the distal internal carotid diameter as the denominator. RADIATION DOSE REDUCTION: This exam was performed according to the departmental dose-optimization program which includes automated exposure control, adjustment of the mA and/or kV according to patient size and/or use of iterative reconstruction technique. CONTRAST:  75mL OMNIPAQUE IOHEXOL 350 MG/ML SOLN COMPARISON:  None Available. FINDINGS: CT HEAD FINDINGS Brain: 35 x 30 mm enhancing mass with surrounding vasogenic edema centered within the left caudate (axial image 15 series 5, image 16 series 21), suspicious for metastasis. Cortical gray-white differentiation is preserved. No acute hemorrhage. Associated 13 mm of rightward midline shift and diffuse sulcal effacement. Vascular: No hyperdense vessel or unexpected calcification. Skull: No calvarial fracture or suspicious bone lesion. Skull base is unremarkable. Sinuses/Orbits: Unremarkable. Other: None. Review of the MIP images confirms the above findings CTA NECK FINDINGS Aortic arch: Three-vessel arch configuration. Arch vessel origins are patent. Right carotid system: No evidence of dissection, stenosis (50% or greater), or occlusion. Left carotid system: No evidence of dissection, stenosis (50% or greater), or occlusion. Vertebral arteries: Left dominant. No evidence of dissection, stenosis (50% or greater), or occlusion. Skeleton: Unremarkable. Other neck: Unremarkable. Upper chest: Unremarkable. Review of the MIP images confirms the  above findings CTA HEAD FINDINGS Anterior circulation: Intracranial ICAs are patent  without stenosis or aneurysm. The proximal ACAs and MCAs are patent without stenosis or aneurysm. Distal branches are symmetric. Posterior circulation: Normal basilar artery. The SCAs, AICAs and PICAs are patent proximally. The PCAs are patent proximally without stenosis or aneurysm. Distal branches are symmetric. Venous sinuses: As permitted by contrast timing, patent. Anatomic variants: Persistent fetal origin of the bilateral PCAs with hypoplastic P1 segments bilaterally. Review of the MIP images confirms the above findings IMPRESSION: 1. 35 x 30 mm enhancing mass with surrounding vasogenic edema centered within the left caudate, suspicious for metastasis. Associated 13 mm of rightward midline shift and diffuse sulcal effacement. 2. No large vessel occlusion, hemodynamically significant stenosis, or evidence of dissection in the head or neck vessels. Electronically Signed   By: Orvan Falconer M.D.   On: 09/15/2022 12:24           LOS: 0 days   Time spent= 35 mins    [place] Festa Joline Maxcy, MD [facility]  If 7PM-7AM, please contact night-coverage  09/16/2022, 8:05 AM

## 2022-09-16 NOTE — Discharge Summary (Signed)
Physician Discharge Summary  Nehal Koleszar BJY:[id number] DOB: Sep 19, 1990 DOA: 09/15/2022  PCP: Shawnie Dapper, PA-C  Admit date: 09/15/2022 Discharge date: 09/16/2022  Admitted From: Home Disposition:  Home  Recommendations for Outpatient Follow-up:  Follow up with PCP in 1-2 weeks Please obtain BMP/CBC in one week your next doctors visit.  Outpatient follow up with Oncology and Radiation Oncology.    Discharge Condition: Stable CODE STATUS: Full  Diet recommendation: Regular  Brief/Interim Summary: 32 year old with history of right-sided stage IIb intraductal carcinoma and treated with mastectomy in July 2023 and completed neoadjuvant chemotherapy earlier this year admitted to the hospital with 1 week of diplopia.  Patient was found to have 3 cm enhancing mass with vasogenic edema in the left caudate suspicious for metastases.  She also reported of some frontal headache especially with standing up.  Neurosurgery was consulted and patient was started on Decadron.  Radiation oncology will also also consulted  She was doing better on decadron. After discussion with Onc and rad onc, patient is being discharged in a stable condition with outpatient follow up.   Rest of the details in Progress note from earlier today.      Discharge Diagnoses:  Principal Problem:   Brain mass      Consultations: Onc Rad Onc NeuroSx  Subjective: Doing ok, still has diplopia.  Husband at bedside Wishes to go home.   Discharge Exam: Vitals:   09/16/22 1011 09/16/22 1319  BP: 133/73 122/60  Pulse: 92 63  Resp: 18 18  Temp:  97.9 F (36.6 C)  SpO2: 99% 100%   Vitals:   09/15/22 2255 09/16/22 0250 09/16/22 1011 09/16/22 1319  BP: 117/72 110/72 133/73 122/60  Pulse: (!) 55 (!) 57 92 63  Resp: 18 18 18 18   Temp: 98.1 F (36.7 C) 98.1 F (36.7 C)  97.9 F (36.6 C)  TempSrc: Oral Oral  Oral  SpO2: 100% 99% 99% 100%  Weight:      Height:        General: Pt is alert,  awake, not in acute distress Cardiovascular: RRR, S1/S2 +, no rubs, no gallops Respiratory: CTA bilaterally, no wheezing, no rhonchi Abdominal: Soft, NT, ND, bowel sounds + Extremities: no edema, no cyanosis  Discharge Instructions   Allergies as of 09/16/2022   No Known Allergies      Medication List     STOP taking these medications    traMADol 50 MG tablet Commonly known as: ULTRAM       TAKE these medications    dexamethasone 4 MG tablet Commonly known as: DECADRON Take 1 tablet (4 mg total) by mouth 2 (two) times daily with a meal for 7 days.   Refresh Tears 0.5 % Soln Generic drug: carboxymethylcellulose Place 1 drop into both eyes 3 (three) times daily as needed (Dry eye).        Follow-up Information     Shawnie Dapper, PA-C Follow up in 1 week(s).   Specialties: Physician Assistant, Internal Medicine Contact information: [address] [place] [address] [phone]                No Known Allergies  You were cared for by a hospitalist during your hospital stay. If you have any questions about your discharge medications or the care you received while you were in the hospital after you are discharged, you can call the unit and asked to speak with the hospitalist on call if the hospitalist that took care of  you is not available. Once you are discharged, your primary care physician will handle any further medical issues. Please note that no refills for any discharge medications will be authorized once you are discharged, as it is imperative that you return to your primary care physician (or establish a relationship with a primary care physician if you do not have one) for your aftercare needs so that they can reassess your need for medications and monitor your lab values.  You were cared for by a hospitalist during your hospital stay. If you have any questions about your discharge medications or the care you received while you were in the hospital  after you are discharged, you can call the unit and asked to speak with the hospitalist on call if the hospitalist that took care of you is not available. Once you are discharged, your primary care physician will handle any further medical issues. Please note that NO REFILLS for any discharge medications will be authorized once you are discharged, as it is imperative that you return to your primary care physician (or establish a relationship with a primary care physician if you do not have one) for your aftercare needs so that they can reassess your need for medications and monitor your lab values.  Please request your Prim.MD to go over all Hospital Tests and Procedure/Radiological results at the follow up, please get all Hospital records sent to your Prim MD by signing hospital release before you go home.  Get CBC, CMP, 2 view Chest X ray checked  by Primary MD during your next visit or SNF MD in 5-7 days ( we routinely change or add medications that can affect your baseline labs and fluid status, therefore we recommend that you get the mentioned basic workup next visit with your PCP, your PCP may decide not to get them or add new tests based on their clinical decision)  On your next visit with your primary care physician please Get Medicines reviewed and adjusted.  If you experience worsening of your admission symptoms, develop shortness of breath, life threatening emergency, suicidal or homicidal thoughts you must seek medical attention immediately by calling 911 or calling your MD immediately  if symptoms less severe.  You Must read complete instructions/literature along with all the possible adverse reactions/side effects for all the Medicines you take and that have been prescribed to you. Take any new Medicines after you have completely understood and accpet all the possible adverse reactions/side effects.   Do not drive, operate heavy machinery, perform activities at heights, swimming or  participation in water activities or provide baby sitting services if your were admitted for syncope or siezures until you have seen by Primary MD or a Neurologist and advised to do so again.  Do not drive when taking Pain medications.   Procedures/Studies: MR BRAIN W WO CONTRAST  Result Date: 09/15/2022 CLINICAL DATA:  Brain metastases, unknown primary EXAM: MRI HEAD WITHOUT AND WITH CONTRAST TECHNIQUE: Multiplanar, multiecho pulse sequences of the brain and surrounding structures were obtained without and with intravenous contrast. CONTRAST:  7mL GADAVIST GADOBUTROL 1 MMOL/ML IV SOLN COMPARISON:  Same-day CT head and neck angiogram FINDINGS: Brain: There is a 3.4 x 2.5 x 3.3 cm contrast-enhancing mass centered around the left caudate head. Contrast enhancement extends along the ependymal margin of the left frontal horn (series 1100, image 193). There is extensive vasogenic edema surrounding this mass involving a large portion of the left frontal lobe, extending along the cortical spinal tracts into the  cerebral peduncles, and medially across midline along the genu of the corpus callosum (series 4, image 29). There is asymmetric enlargement of the right temporal horn with periventricular T2/FLAIR hyperintense signal abnormality, which raises the possibility of ventricular entrapment. Negative for an acute infarct. No hemorrhage. No extra-axial fluid collection. Vascular: Normal flow voids. Skull and upper cervical spine: Normal marrow signal. Sinuses/Orbits: Negative. Other: None. IMPRESSION: 1. There is a 3.4 x 2.5 x 3.3 cm contrast-enhancing mass centered around the left caudate head with extensive surrounding vasogenic edema, as above. Contrast enhancement extends along the ependymal margin of the left frontal horn. Given patient's history, metastatic disease is a differential consideration. However, this degree of T2/FLAIR signal abnormality is somewhat atypical for metastatic disease, particularly  infiltration along the corticospinal tracts, as well as across the genu of the corpus callosum; an additional differential consideration is a high grade glioma. 2. Asymmetric enlargement of the right temporal horn with periventricular T2/FLAIR hyperintense signal abnormality, which raises the possibility of ventricular entrapment. Electronically Signed   By: Lorenza Cambridge M.D.   On: 09/15/2022 19:01   CT CHEST ABDOMEN PELVIS W CONTRAST  Result Date: 09/15/2022 CLINICAL DATA:  Metastatic disease evaluation, primary search, brain mass, history of breast cancer * Tracking Code: BO * EXAM: CT CHEST, ABDOMEN, AND PELVIS WITH CONTRAST TECHNIQUE: Multidetector CT imaging of the chest, abdomen and pelvis was performed following the standard protocol during bolus administration of intravenous contrast. RADIATION DOSE REDUCTION: This exam was performed according to the departmental dose-optimization program which includes automated exposure control, adjustment of the mA and/or kV according to patient size and/or use of iterative reconstruction technique. CONTRAST:  80mL OMNIPAQUE IOHEXOL 300 MG/ML SOLN additional oral enteric contrast COMPARISON:  CT chest abdomen pelvis, 04/27/2021 FINDINGS: CT CHEST FINDINGS Cardiovascular: No significant vascular findings. Normal heart size. No pericardial effusion. Mediastinum/Nodes: Status post right axillary lymph node dissection. No enlarged mediastinal, hilar, or axillary lymph nodes. Thymic remnant in the anterior mediastinum. Thyroid gland, trachea, and esophagus demonstrate no significant findings. Lungs/Pleura: Minimal scattered subpleural ground-glass of the anterior right upper lobe and right middle lobe (series 6, image 95). Minimal consolidation of the right apex (series 6, image 16). No pleural effusion or pneumothorax. Musculoskeletal: Status post right mastectomy with implant reconstruction and left prepectoral breast implant. No acute osseous findings. CT ABDOMEN  PELVIS FINDINGS Hepatobiliary: No solid liver abnormality is seen. No gallstones, gallbladder wall thickening, or biliary dilatation. Pancreas: Unremarkable. No pancreatic ductal dilatation or surrounding inflammatory changes. Spleen: Normal in size without significant abnormality. Adrenals/Urinary Tract: Adrenal glands are unremarkable. Kidneys are normal, without renal calculi, solid lesion, or hydronephrosis. Excreted contrast in the urinary bladder. Stomach/Bowel: Stomach is within normal limits. Appendix not clearly visualized. No evidence of bowel wall thickening, distention, or inflammatory changes. Moderate burden of stool throughout the colon. Vascular/Lymphatic: No significant vascular findings are present. No enlarged abdominal or pelvic lymph nodes. Reproductive: No mass or other abnormality. Other: No abdominal wall hernia or abnormality. No ascites. Musculoskeletal: No acute osseous findings. IMPRESSION: 1. Status post right mastectomy with implant reconstruction and left prepectoral breast implant. 2. No evidence of lymphadenopathy or metastatic disease in the chest, abdomen, or pelvis. 3. Minimal scattered subpleural ground-glass of the anterior right upper lobe and right middle lobe. Minimal consolidation of the right apex. 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 Electronically Signed   By: Jearld Lesch M.D.   On: 09/15/2022 14:09   CT ANGIO HEAD NECK W WO CM  Result Date: 09/15/2022 CLINICAL DATA:  Diplopia. EXAM: CT ANGIOGRAPHY HEAD AND NECK WITH AND WITHOUT CONTRAST TECHNIQUE: Multidetector CT imaging of the head and neck was performed using the standard protocol during bolus administration of intravenous contrast. Multiplanar CT image reconstructions and MIPs were obtained to evaluate the vascular anatomy. Carotid stenosis measurements (when applicable) are obtained utilizing NASCET criteria, using the distal internal carotid diameter as the  denominator. RADIATION DOSE REDUCTION: This exam was performed according to the departmental dose-optimization program which includes automated exposure control, adjustment of the mA and/or kV according to patient size and/or use of iterative reconstruction technique. CONTRAST:  75mL OMNIPAQUE IOHEXOL 350 MG/ML SOLN COMPARISON:  None Available. FINDINGS: CT HEAD FINDINGS Brain: 35 x 30 mm enhancing mass with surrounding vasogenic edema centered within the left caudate (axial image 15 series 5, image 16 series 21), suspicious for metastasis. Cortical gray-white differentiation is preserved. No acute hemorrhage. Associated 13 mm of rightward midline shift and diffuse sulcal effacement. Vascular: No hyperdense vessel or unexpected calcification. Skull: No calvarial fracture or suspicious bone lesion. Skull base is unremarkable. Sinuses/Orbits: Unremarkable. Other: None. Review of the MIP images confirms the above findings CTA NECK FINDINGS Aortic arch: Three-vessel arch configuration. Arch vessel origins are patent. Right carotid system: No evidence of dissection, stenosis (50% or greater), or occlusion. Left carotid system: No evidence of dissection, stenosis (50% or greater), or occlusion. Vertebral arteries: Left dominant. No evidence of dissection, stenosis (50% or greater), or occlusion. Skeleton: Unremarkable. Other neck: Unremarkable. Upper chest: Unremarkable. Review of the MIP images confirms the above findings CTA HEAD FINDINGS Anterior circulation: Intracranial ICAs are patent without stenosis or aneurysm. The proximal ACAs and MCAs are patent without stenosis or aneurysm. Distal branches are symmetric. Posterior circulation: Normal basilar artery. The SCAs, AICAs and PICAs are patent proximally. The PCAs are patent proximally without stenosis or aneurysm. Distal branches are symmetric. Venous sinuses: As permitted by contrast timing, patent. Anatomic variants: Persistent fetal origin of the bilateral PCAs  with hypoplastic P1 segments bilaterally. Review of the MIP images confirms the above findings IMPRESSION: 1. 35 x 30 mm enhancing mass with surrounding vasogenic edema centered within the left caudate, suspicious for metastasis. Associated 13 mm of rightward midline shift and diffuse sulcal effacement. 2. No large vessel occlusion, hemodynamically significant stenosis, or evidence of dissection in the head or neck vessels. Electronically Signed   By: Orvan Falconer M.D.   On: 09/15/2022 12:24     The results of significant diagnostics from this hospitalization (including imaging, microbiology, ancillary and laboratory) are listed below for reference.     Microbiology: No results found for this or any previous visit (from the past 240 hour(s)).   Labs: BNP (last 3 results) No results for input(s): "BNP" in the last 8760 hours. Basic Metabolic Panel: Recent Labs  Lab 09/15/22 1025  NA 139  K 4.3  CL 106  GLUCOSE 91  BUN 18  CREATININE 1.00   Liver Function Tests: No results for input(s): "AST", "ALT", "ALKPHOS", "BILITOT", "PROT", "ALBUMIN" in the last 168 hours. No results for input(s): "LIPASE", "AMYLASE" in the last 168 hours. No results for input(s): "AMMONIA" in the last 168 hours. CBC: Recent Labs  Lab 09/15/22 1010 09/15/22 1025  WBC 6.5  --   NEUTROABS 4.3  --   HGB 14.2 14.3  HCT 41.6 42.0  MCV 91.2  --   PLT 212  --    Cardiac Enzymes: No results for input(s): "CKTOTAL", "CKMB", "CKMBINDEX", "TROPONINI" in  the last 168 hours. BNP: Invalid input(s): "POCBNP" CBG: No results for input(s): "GLUCAP" in the last 168 hours. D-Dimer No results for input(s): "DDIMER" in the last 72 hours. Hgb A1c No results for input(s): "HGBA1C" in the last 72 hours. Lipid Profile No results for input(s): "CHOL", "HDL", "LDLCALC", "TRIG", "CHOLHDL", "LDLDIRECT" in the last 72 hours. Thyroid function studies No results for input(s): "TSH", "T4TOTAL", "T3FREE", "THYROIDAB" in the  last 72 hours.  Invalid input(s): "FREET3" Anemia work up No results for input(s): "VITAMINB12", "FOLATE", "FERRITIN", "TIBC", "IRON", "RETICCTPCT" in the last 72 hours. Urinalysis    Component Value Date/Time   COLORURINE ORANGE (A) 01/25/2022 1548   APPEARANCEUR CLEAR 01/25/2022 1548   LABSPEC 1.008 01/25/2022 1548   PHURINE 5.0 01/25/2022 1548   GLUCOSEU NEGATIVE 01/25/2022 1548   HGBUR NEGATIVE 01/25/2022 1548   BILIRUBINUR NEGATIVE 01/25/2022 1548   BILIRUBINUR negative 05/16/2021 1015   BILIRUBINUR neg 02/11/2015 1249   KETONESUR NEGATIVE 01/25/2022 1548   PROTEINUR NEGATIVE 01/25/2022 1548   UROBILINOGEN 0.2 05/16/2021 1015   NITRITE POSITIVE (A) 01/25/2022 1548   LEUKOCYTESUR NEGATIVE 01/25/2022 1548   Sepsis Labs Recent Labs  Lab 09/15/22 1010  WBC 6.5   Microbiology No results found for this or any previous visit (from the past 240 hour(s)).   Time coordinating discharge:  I have spent 35 minutes face to face with the patient and on the ward discussing the patients care, assessment, plan and disposition with other care givers. >50% of the time was devoted counseling the patient about the risks and benefits of treatment/Discharge disposition and coordinating care.   SIGNED:   Dimple Nanas, MD  [facility] 09/16/2022, 3:00 PM   If 7PM-7AM, please contact night-coverage

## 2022-09-18 NOTE — Progress Notes (Signed)
Patient Care Team: Samuella Bruin as PCP - General (Physician Assistant) Serena Croissant, MD as Consulting Physician (Hematology and Oncology) Abigail Miyamoto, MD as Consulting Physician (General Surgery) Lonie Peak, MD as Attending Physician (Radiation Oncology)  DIAGNOSIS: No diagnosis found.  SUMMARY OF ONCOLOGIC HISTORY: Oncology History  Malignant neoplasm of upper-outer quadrant of right breast in female, estrogen receptor negative (HCC)  04/08/2021 Initial Diagnosis   History of right breast eczematous change to the nipple, patient had palpable lump in the right breast her mammogram 2.1 cm spiculated mass with extensive calcifications that span 11 cm.  Ultrasound revealed 3 cm mass at 10:00 plus small satellite lesions.  Biopsy revealed grade 3 IDC ER 0%, PR 0%, HER2 3+ positive, Ki-67 30%   04/08/2021 Cancer Staging   Staging form: Breast, AJCC 8th Edition - Clinical stage from 04/08/2021: Stage IIB (cT2, cN1, cM0, G3, ER-, PR-, HER2+) - Signed by Serena Croissant, MD on 04/15/2021 Stage prefix: Initial diagnosis Method of lymph node assessment: Axillary lymph node dissection Histologic grading system: 3 grade system   05/01/2021 - 11/26/2021 Chemotherapy   Patient is on Treatment Plan : BREAST  Docetaxel + Carboplatin + Trastuzumab + Pertuzumab  (TCHP) q21d      05/21/2021 Genetic Testing   Negative hereditary cancer genetic testing: no pathogenic variants detected in [facility] CustomNext-Cancer +RNAinsight Panel.  Report date is 05/21/2021.   The CustomNext-Cancer+RNAinsight panel offered by [employer] [facility] includes sequencing and rearrangement analysis for the following 47 genes:  APC, ATM, AXIN2, BARD1, BMPR1A, BRCA1, BRCA2, BRIP1, CDH1, CDK4, CDKN2A, CHEK2, DICER1, EPCAM, GREM1, HOXB13, MEN1, MLH1, MSH2, MSH3, MSH6, MUTYH, NBN, NF1, NF2, NTHL1, PALB2, PMS2, POLD1, POLE, PTEN, RAD51C, RAD51D, RECQL, RET, SDHA, SDHAF2, SDHB, SDHC, SDHD, SMAD4, SMARCA4, STK11, TP53, TSC1, TSC2, and  VHL.  RNA data is routinely analyzed for use in variant interpretation for all genes.   09/04/2021 - 04/21/2022 Chemotherapy   Patient is on Treatment Plan : BREAST Trastuzumab  + Pertuzumab q21d x 13 cycles     10/13/2021 Surgery   right mastectomy with reconstruction: No residual malignancy complete pathologic response 0/5 lymph nodes negative    12/03/2021 - 01/11/2022 Radiation Therapy   Site Technique Total Dose (Gy) Dose per Fx (Gy) Completed Fx Beam Energies  Chest Wall, Right: CW_R_IMN 3D 50.4/50.4 1.8 28/28 6XFFF  Chest Wall, Right: CW_R_PAB_SCV 3D 50.4/50.4 1.8 28/28 6X, 10X       CHIEF COMPLIANT: Follow-up on Herceptin Perjeta     INTERVAL HISTORY: Tara White is a 32 y.o with the above mention currently on Herceptin Perjeta. She presents to the clinic for a follow-up.   ALLERGIES:  has No Known Allergies.  MEDICATIONS:  Current Outpatient Medications  Medication Sig Dispense Refill   carboxymethylcellulose (REFRESH TEARS) 0.5 % SOLN Place 1 drop into both eyes 3 (three) times daily as needed (Dry eye).     dexamethasone (DECADRON) 4 MG tablet Take 1 tablet (4 mg total) by mouth 2 (two) times daily with a meal for 7 days. 14 tablet 0   No current facility-administered medications for this visit.    PHYSICAL EXAMINATION: ECOG PERFORMANCE STATUS: {CHL ONC ECOG [phone]}  There were no vitals filed for this visit. There were no vitals filed for this visit.  BREAST:*** No palpable masses or nodules in either right or left breasts. No palpable axillary supraclavicular or infraclavicular adenopathy no breast tenderness or nipple discharge. (exam performed in the presence of a chaperone)  LABORATORY DATA:  I have  reviewed the data as listed    Latest Ref Rng & Units 09/15/2022   10:25 AM 04/21/2022   11:03 AM 03/08/2022   11:26 AM  CMP  Glucose 70 - 99 mg/dL 91  78  89   BUN 6 - 20 mg/dL 18  22  17    Creatinine 0.44 - 1.00 mg/dL 1.61  0.96  0.45   Sodium  135 - 145 mmol/L 139  137  137   Potassium 3.5 - 5.1 mmol/L 4.3  4.1  4.0   Chloride 98 - 111 mmol/L 106  105  106   CO2 22 - 32 mmol/L  27  24   Calcium 8.9 - 10.3 mg/dL  9.1  9.1   Total Protein 6.5 - 8.1 g/dL  7.1  6.9   Total Bilirubin 0.3 - 1.2 mg/dL  0.5  0.6   Alkaline Phos 38 - 126 U/L  80  94   AST 15 - 41 U/L  19  16   ALT 0 - 44 U/L  16  15     Lab Results  Component Value Date   WBC 6.5 09/15/2022   HGB 14.3 09/15/2022   HCT 42.0 09/15/2022   MCV 91.2 09/15/2022   PLT 212 09/15/2022   NEUTROABS 4.3 09/15/2022    ASSESSMENT & PLAN:  No problem-specific Assessment & Plan notes found for this encounter.    No orders of the defined types were placed in this encounter.  The patient has a good understanding of the overall plan. she agrees with it. she will call with any problems that may develop before the next visit here. Total time spent: 30 mins including face to face time and time spent for planning, charting and co-ordination of care   Sherlyn Lick, CMA 09/18/22    I Janan Ridge am acting as a Neurosurgeon for [employer]  ***

## 2022-09-20 ENCOUNTER — Other Ambulatory Visit: Payer: Self-pay | Admitting: *Deleted

## 2022-09-20 ENCOUNTER — Inpatient Hospital Stay: Payer: Commercial Managed Care - PPO | Attending: Adult Health | Admitting: Hematology and Oncology

## 2022-09-20 ENCOUNTER — Encounter: Payer: Self-pay | Admitting: *Deleted

## 2022-09-20 ENCOUNTER — Telehealth: Payer: Self-pay

## 2022-09-20 ENCOUNTER — Other Ambulatory Visit: Payer: Self-pay

## 2022-09-20 ENCOUNTER — Ambulatory Visit
Admission: RE | Admit: 2022-09-20 | Discharge: 2022-09-20 | Disposition: A | Discharge status: Home / Self Care | Payer: Commercial Managed Care - PPO | Source: Ambulatory Visit | Attending: Hematology and Oncology | Admitting: Hematology and Oncology

## 2022-09-20 VITALS — BP 141/95 | HR 67 | Temp 97.6°F | Resp 18 | Ht 69.0 in | Wt 158.9 lb

## 2022-09-20 DIAGNOSIS — Z9011 Acquired absence of right breast and nipple: Secondary | ICD-10-CM | POA: Diagnosis not present

## 2022-09-20 DIAGNOSIS — C7931 Secondary malignant neoplasm of brain: Secondary | ICD-10-CM

## 2022-09-20 DIAGNOSIS — Z171 Estrogen receptor negative status [ER-]: Secondary | ICD-10-CM | POA: Diagnosis not present

## 2022-09-20 DIAGNOSIS — C50411 Malignant neoplasm of upper-outer quadrant of right female breast: Secondary | ICD-10-CM | POA: Insufficient documentation

## 2022-09-20 LAB — BUN & CREATININE (CHCC)
BUN: 21 mg/dL — ABNORMAL HIGH (ref 6–20)
Creatinine: 1.02 mg/dL — ABNORMAL HIGH (ref 0.44–1.00)
GFR, Estimated: >60 mL/min (ref >60)

## 2022-09-20 NOTE — Progress Notes (Signed)
Per MD request RN successfully faxed referral to Dr. Melodye Ped with [facility] for palliative radiation with [facility] treatment [phone]).

## 2022-09-20 NOTE — Progress Notes (Signed)
Bun and crt ordered for ct sim

## 2022-09-20 NOTE — Telephone Encounter (Signed)
Pt called to inform her that we need labs for iv start for Friday. Lab seat placed for today and orders placed in [facility].

## 2022-09-20 NOTE — Assessment & Plan Note (Signed)
Hospitalization: 09/15/2022-09/16/2022: Diplopia: Newly diagnosed brain metastases: 3.74 cm left caudate lobe lesion, not a surgical candidate.  (04/08/2021: ER 0%, PR 0%, HER2 3+ positive right breast cancer status post neoadjuvant chemotherapy with TCH Perjeta: Pathologic complete response, adjuvant radiation, Herceptin and Perjeta maintenance completed 04/21/2021)  Treatment plan: Patient is waiting to hear from Dr. Caswell Corwin regarding palliative radiation with [facility] Subsequently systemic treatment options could include oral thousand and inhibitor therapy like Tucatinib, neratinib or lapatinib.  Systemic therapy will depend on a consultation with Dr. Caswell Corwin She may consider her naturopathic treatments as well. I sent a message to [facility] to get the patient to be seen by them.

## 2022-09-20 NOTE — Progress Notes (Signed)
Histology and Location of Primary Cancer:  FINAL MICROSCOPIC DIAGNOSIS:  A.   BREAST, RIGHT, MASTECTOMY: -    No residual malignancy identified (no invasive carcinoma or DCIS identified). -    Prior tumor bed with fibrosis and calcifications (neoadjuvant therapy related). -    Prior biopsy site changes.  B.   LYMPH NODE, RIGHT AXILLA WITH TARGETED NODE, EXCISION: -    Fibroadipose tissue and focal glandular breast parenchyma, with no malignancy identified. -    No lymph node or lymph node remnant present (tissue entirely submitted for microscopic examination (See Specimen D which contains targeted lymph node).  C.   LYMPH NODE, RIGHT AXILLA, SENTINEL, EXCISION: -    Lymph node with no malignancy identified (0/1).  D.   LYMPH NODE, RIGHT AXILLA, SENTINEL, EXCISION: -    Lymph node with prior biopsy site changes and therapy-related changes. -    No residual malignancy identified (0/1).  E.   LYMPH NODE, RIGHT AXILLA, SENTINEL, EXCISION: -    Lymph node with no malignancy identified (0/1).  F.   LYMPH NODE, RIGHT AXILLA, SENTINEL, EXCISION: -    Lymph node with no malignancy identified (0/1).  G.   LYMPH NODE, RIGHT AXILLA, SENTINEL, EXCISION: -    Lymph node with no malignancy identified (0/1).  ONCOLOGY TABLE:  INVASIVE CARCINOMA OF THE BREAST, STATUS POST NEOADJUVANT TREATMENT: Resection  Procedure: Total mastectomy Specimen Laterality: Right Histologic Type: No residual invasive carcinoma Histologic Grade:      Glandular (Acinar)/Tubular Differentiation: Not applicable      Nuclear Pleomorphism: Not applicable      Mitotic Rate: Not applicable      Overall Grade: Not applicable Tumor Size: No residual invasive carcinoma Ductal Carcinoma In Situ: Not identified Tumor Extent: Not applicable Treatment Effect in the Breast: No residual invasive carcinoma is present in the breast after presurgical therapy Treatment Effect in Lymph Nodes: No lymph node metastases.   Fibrous scarring related to prior lymph node metastases with pathologic complete response Margins: Not applicable (residual invasive carcinoma in specimen is absent) DCIS Margins: Not applicable (residual DCIS in specimen is absent) Regional Lymph Nodes: Regional lymph nodes present, all regional lymph nodes negative for tumor      Number of Lymph Nodes Examined: 5      Number of Sentinel Nodes Examined: 5      Number of Lymph Nodes with Macrometastases (>2 mm): 0      Number of Lymph Nodes with Micrometastases: 0      Number of Lymph Nodes with Isolated Tumor Cells (=0.2 mm or =200 cells): 0      Size of Largest Metastatic Deposit (millimeters): Not applicable      Extranodal Extension: Not applicable Distant Metastasis:      Distant Site(s) Involved: Not applicable Breast Prognostic Profile (Pre-neoadjuvant case #: [id number]  Right Breast Biopsy:       Estrogen Receptor: 0%, negative      Progesterone Receptor: Less than 1%, negative      Her2: Positive, 3+      Ki-67: 30%  Right Axillary Lymph Node        Estrogen receptor: 0%, negative       Progesterone receptor: 0%, negative       HER2: Positive, 3+       Ki-67:  40%  Residual Cancer Burden (RCB):      Primary Tumor Bed: 29 mm x 20 mm      Overall Cancer Cellularity: 0%  Percentage of Cancer that is in Situ: 0%      Number of Positive Lymph Nodes: 0      Diameter of Largest Lymph Node metastasis: Not applicable      Residual Cancer Burden: 0      Residual Cancer Burden Class: pCR Pathologic Stage Classification (pTNM, AJCC 8th Edition): ypT0, ypN0(sn) Representative Tumor Block: No residual invasive carcinoma present Comment(s):  The lymph nodes (blocks C1-G1) were interrogated with cytokeratin AE1/AE3 for occult carcinoma cells.  No CK AE1/AE3 positive malignant cells are identified.  The immunohistochemical (IHC) stain controls are satisfactory.  (v4.5.0.0)   Location(s) of Symptomatic tumor(s):   09/15/2022  MR BRAIN W WO CONTRAST  IMPRESSION: 1. There is a 3.4 x 2.5 x 3.3 cm contrast-enhancing mass centered around the left caudate head with extensive surrounding vasogenic edema, as above. Contrast enhancement extends along the ependymal margin of the left frontal horn. Given patient's history, metastatic disease is a differential consideration. However, this degree of T2/FLAIR signal abnormality is somewhat atypical for metastatic disease, particularly infiltration along the corticospinal tracts, as well as across the genu of the corpus callosum; an additional differential consideration is a high grade glioma. 2. Asymmetric enlargement of the right temporal horn with periventricular T2/FLAIR hyperintense signal abnormality, which raises the possibility of ventricular entrapment.  Past/Anticipated chemotherapy by medical oncology, if any:  Loa Socks, NP 08/20/2022  Oncology History  Malignant neoplasm of upper-outer quadrant of right breast in female, estrogen receptor negative (HCC)  04/08/2021 Initial Diagnosis    History of right breast eczematous change to the nipple, patient had palpable lump in the right breast her mammogram 2.1 cm spiculated mass with extensive calcifications that span 11 cm.  Ultrasound revealed 3 cm mass at 10:00 plus small satellite lesions.  Biopsy revealed grade 3 IDC ER 0%, PR 0%, HER2 3+ positive, Ki-67 30%    04/08/2021 Cancer Staging    Staging form: Breast, AJCC 8th Edition - Clinical stage from 04/08/2021: Stage IIB (cT2, cN1, cM0, G3, ER-, PR-, HER2+) - Signed by Serena Croissant, MD on 04/15/2021 Stage prefix: Initial diagnosis Method of lymph node assessment: Axillary lymph node dissection Histologic grading system: 3 grade system    05/01/2021 - 11/26/2021 Chemotherapy    Patient is on Treatment Plan : BREAST  Docetaxel + Carboplatin + Trastuzumab + Pertuzumab  (TCHP) q21d      05/21/2021 Genetic Testing    Negative hereditary  cancer genetic testing: no pathogenic variants detected in [facility]-Cancer +RNAinsight Panel.  Report date is 05/21/2021.    The CustomNext-Cancer+RNAinsight panel offered by [facility] includes sequencing and rearrangement analysis for the following 47 genes:  APC, ATM, AXIN2, BARD1, BMPR1A, BRCA1, BRCA2, BRIP1, CDH1, CDK4, CDKN2A, CHEK2, DICER1, EPCAM, GREM1, HOXB13, MEN1, MLH1, MSH2, MSH3, MSH6, MUTYH, NBN, NF1, NF2, NTHL1, PALB2, PMS2, POLD1, POLE, PTEN, RAD51C, RAD51D, RECQL, RET, SDHA, SDHAF2, SDHB, SDHC, SDHD, SMAD4, SMARCA4, STK11, TP53, TSC1, TSC2, and VHL.  RNA data is routinely analyzed for use in variant interpretation for all genes.    09/04/2021 -  Chemotherapy    Patient is on Treatment Plan : BREAST Trastuzumab  + Pertuzumab q21d x 13 cycles     12/03/2021 - 01/11/2022 Radiation Therapy    Site Technique Total Dose (Gy) Dose per Fx (Gy) Completed Fx Beam Energies  Chest Wall, Right: CW_R_IMN 3D 50.4/50.4 1.8 28/28 6XFFF  Chest Wall, Right: CW_R_PAB_SCV 3D 50.4/50.4 1.8 28/28 6X, 10X       ASSESSMENT AND PLAN:  Ms.. Hollingshead is a pleasant 32 y.o. female with Stage IIB right breast invasive ductal carcinoma, ER-/PR-/HER2+, diagnosed in 03/2021, treated with neoadjuvant chemotherapy, mastectomy, maintenance Herceptin/Perjeta, and adjuvant radiation .  She presents to the Survivorship [facility] for our initial meeting and routine follow-up post-completion of treatment for breast cancer.      1. Stage IIB right breast cancer:  Ms. Sliva is continuing to recover from definitive treatment for breast cancer. She will follow-up with her medical oncologist, Dr.  Pamelia Hoit in 6 months with history and physical exam per surveillance protocol.   We recommend repeat left breast mammogram in January 2025.  Due to her breast density category D and her being a young breast cancer survivor at high risk for breast cancer recurrence in her contralateral breast we recommend intensified screening with  breast MRI in July which I ordered today.  Today, a comprehensive survivorship care plan and treatment summary was reviewed with the patient today detailing her breast cancer diagnosis, treatment course, potential late/long-term effects of treatment, appropriate follow-up care with recommendations for the future, and patient education resources.  A copy of this summary, along with a letter will be sent to the patient's primary care provider via mail/fax/In Basket message after today's visit.     2. Bone health:  Marland Kitchen  She was given education on specific activities to promote bone health.   3. Cancer screening:  Due to Ms. Birchler's history and her age, she should receive screening for skin cancers, colon cancer (beginning at age 90), and gynecologic cancers.  The information and recommendations are listed on the patient's comprehensive care plan/treatment summary and were reviewed in detail with the patient.     4. Health maintenance and wellness promotion: Ms. Bowditch was encouraged to consume 5-7 servings of fruits and vegetables per day. We reviewed the "Nutrition Rainbow" handout.  She was also encouraged to engage in moderate to vigorous exercise for 30 minutes per day most days of the week.  She was instructed to limit her alcohol consumption and continue to abstain from tobacco use.     5. Support services/counseling: It is not uncommon for this period of the patient's cancer care trajectory to be one of many emotions and stressors.   She was given information regarding our available services and encouraged to contact me with any questions or for help enrolling in any of our support group/programs.      Follow up instructions:    -Return to cancer center in 6 months for follow up with Dr. Pamelia Hoit  -Mammogram due in 03/2023 -Breast MRI 09/2022 -She is welcome to return back to the Survivorship Clinic at any time; no additional follow-up needed at this time.  -Consider referral back to survivorship as  a long-term survivor for continued surveillanc  Patient's main complaints related to symptomatic tumor(s) are:  Presented with diplopia looking to the left x1 week, headaches since May.   Pain on a scale of 0-10 is: 0   If Spine Met(s), symptoms, if any, include: Bowel/Bladder retention or incontinence (please describe): none to report Numbness or weakness in extremities (please describe): none to report, tingling in right hand intermittently Current Decadron regimen, if applicable: yes, 4mg  BID  Ambulatory status? Walker? Wheelchair?: walks well with no needs  SAFETY ISSUES: Prior radiation? January 13, 2022 Pacemaker/ICD? none Possible current pregnancy? No, had period last week Is the patient on methotrexate? none  Additional Complaints / other details: Pressure in head remains and also diplopia remains. Pt has  no major concerns or questions. Pt does have any major concerns or question.   Vitals:   09/21/22 1527  BP: (!) 130/90  Pulse: 60  Resp: 18  Temp: (!) 97.4 F (36.3 C)  SpO2: 100%

## 2022-09-20 NOTE — Progress Notes (Signed)
Per MD request, RN faxed referral to [facility] [phone]).

## 2022-09-20 NOTE — Progress Notes (Shared)
Radiation Oncology         [phone] ________________________________  Outpatient Re-Consultation  Name: Tara White MRN: [id number]  Date: 09/21/2022  DOB: October 15, 1990  TK:[id number], Yetta Glassman, PA-C  Kirby Crigler, Mir Judie Petit, MD   REFERRING PHYSICIAN: Kirby Crigler, Mir Judie Petit, MD  DIAGNOSIS:     ICD-10-CM   1. 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   Narrative / Interval History - 09/20/22 ::Tara White is a 32 y.o. female who returns today to review recent imaging and to further discuss the role of SRS in management of her (suspected) brain metastasis from a breast cancer primary. To review from her initial consultation visit on 09/15/22, the patient expressed interest in proceeding with SRS if possible. We reviewed that her final treatment decision would depend on her upcoming staging MRI of the brain at that time.   Subsequent MRI of the brain on 09/15/22 demonstrates a 3.4 x 2.5 x 3.3 cm contrast-enhancing mass centered around the left caudate head with extensive surrounding vasogenic edema, and asymmetric enlargement of the right temporal horn with periventricular T2/FLAIR hyperintense signal abnormality, which raises the possibility of ventricular entrapment. Although metastatic disease remains a differential consideration, the mass has a degree of T2/FLAIR signal abnormality that is somewhat atypical for metastatic disease, particularly pertaining to infiltration along the corticospinal tracts, as well as across the genu of the corpus callosum. Based on this, an additional  differential consideration is a high grade glioma.  She will be meeting with neurosurgery in the near future to discuss her condition and possible interventions. She also followed up with Dr. Pamelia Hoit yesterday  to briefly review the role of systemic therapy. Per Dr. Pamelia Hoit, systemic therapy will depend on her consultation by Dr. Caswell Corwin.   HPI Initial Consultation 09/15/2022 ::Tara White is a 32 y.o. female who is seen today to discuss the role of radiation therapy in management of a recently discovered brain mass concerning for metastatic disease from a breast cancer primary. I last met with the patient on 02/09/22 for a 1 month follow-up of radiation to the right breast. She has remained without evidence of disease recurrence until her recent history which is detailed below.    The patient presented to the ED earlier today with c/o worsening diplopia x 1 week. She also reported having some pain at the base of her neck which began about a month ago, as well as some associated pressure in the front of her head which seems to worsen when she stands upright.    A CT of the head was performed today which revealed a 3.5 x 3.0 cm enhancing mass with surrounding vasogenic edema centered within the left caudate, suspicious for metastatic disease. Associated rightward midline shift measuring 1.3 cm and diffuse sulcal effacement were also appreciated. CT otherwise showed no evidence of large vessel occlusion, hemodynamically significant stenosis, or evidence of dissection in the head or neck vessels.   She was subsequently admitted in light of CT findings. Hospital/ED course thus far has included IV Decadron at 10 mg in the ED. She is now continuing on 6 mg Decadron  q 6 hours.   She also had a CT CAP performed today which showed no evidence of lymphadenopathy or metastatic disease in the chest, abdomen, or pelvis. Other findings included minimal scattered subpleural ground-glass of the anterior right  upper lobe and right middle lobe and a minimal consolidation of the right apex. 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   I personally reviewed her images and shared the results with her   She is scheduled to undergo a 3T MRI of brain today.    Pertaining to her breast cancer, she continues to follow with plastic surgery and medical oncology and underwent implant placement on 06/25/22    PREVIOUS RADIATION THERAPY: Yes    Diagnoses: C50.411-Malignant neoplasm of upper-outer quadrant of right female breast  Intent: Curative Radiation Treatment Dates: 12/03/2021 through 01/11/2022 Site Technique Total Dose (Gy) Dose per Fx (Gy) Completed Fx Beam Energies  Chest Wall, Right: CW_R_IMN 3D 50.4/50.4 1.8 28/28 6XFFF  Chest Wall, Right: CW_R_PAB_SCV 3D 50.4/50.4 1.8 28/28 6X, 10X    PAST MEDICAL HISTORY:  has a past medical history of DVT (deep venous thrombosis) (HCC) (06/25/2021), Family history of breast cancer (04/21/2021), and right breast ca (03/2021).    PAST SURGICAL HISTORY: Past Surgical History:  Procedure Laterality Date   BREAST ENHANCEMENT SURGERY Left 06/25/2022   Procedure: MAMMOPLASTY AUGMENTATION, MASTOPEXY (BREAST);  Surgeon: Glenna Fellows, MD;  Location: [facility];  Service: Plastics;  Laterality: Left;   BREAST RECONSTRUCTION WITH PLACEMENT OF TISSUE EXPANDER AND ALLODERM Right 10/13/2021   Procedure: RIGHT BREAST RECONSTRUCTION WITH PLACEMENT OF TISSUE EXPANDER AND ALLODERM;  Surgeon: Glenna Fellows, MD;  Location: [facility];  Service: Plastics;  Laterality: Right;   CESAREAN SECTION N/A 12/10/2015   Procedure: CESAREAN SECTION;  Surgeon: Noland Fordyce, MD;  Location: [facility];  Service: Obstetrics;  Laterality: N/A;   IR CV LINE INJECTION  06/25/2021   IR RADIOLOGY PERIPHERAL GUIDED IV START  06/25/2021   IR US GUIDE VASC ACCESS LEFT  06/25/2021   IR VENO/EXT/UNI LEFT  06/25/2021   NODE  DISSECTION Right 10/13/2021   Procedure: TARGETED LYMPH NODE DISSECTION;  Surgeon: Abigail Miyamoto, MD;  Location: [facility];  Service: General;  Laterality: Right;   PORT-A-CATH REMOVAL Left 05/27/2022   Procedure: REMOVAL PORT-A-CATH;  Surgeon: Abigail Miyamoto, MD;  Location: [facility];  Service: General;  Laterality: Left;   PORTACATH PLACEMENT Left 04/30/2021   Procedure: INSERTION PORT-A-CATH;  Surgeon: Abigail Miyamoto, MD;  Location: [facility];  Service: General;  Laterality: Left;   REMOVAL OF TISSUE EXPANDER AND PLACEMENT OF IMPLANT Right 06/25/2022   Procedure: REMOVAL OF TISSUE EXPANDER AND PLACEMENT OF IMPLANT;  Surgeon: Glenna Fellows, MD;  Location: [facility];  Service: Plastics;  Laterality: Right;   SIMPLE MASTECTOMY WITH AXILLARY SENTINEL NODE BIOPSY Right 10/13/2021   Procedure: RIGHT MASTECTOMY;  Surgeon: Abigail Miyamoto, MD;  Location: [facility];  Service: General;  Laterality: Right;   WISDOM TOOTH EXTRACTION      FAMILY HISTORY: family history includes Breast cancer (age of onset: 43) in her maternal grandmother; Heart failure in her paternal grandfather; Hypothyroidism in her mother; Skin cancer in her maternal grandfather.  SOCIAL HISTORY:  reports that she has never smoked. She has never used smokeless tobacco. She reports current alcohol use of about 2.0 standard drinks of alcohol per week. She reports that she does not use drugs.  ALLERGIES: Patient has no known  allergies.  MEDICATIONS:  Current Outpatient Medications  Medication Sig Dispense Refill   carboxymethylcellulose (REFRESH TEARS) 0.5 % SOLN Place 1 drop into both eyes 3 (three) times daily as needed (Dry eye).     dexamethasone (DECADRON) 4 MG tablet Take 1 tablet (4 mg total) by mouth 2 (two) times daily with a meal. 60 tablet 0   No current facility-administered medications for this encounter.    REVIEW OF SYSTEMS:  As above.    PHYSICAL EXAM:  height is 5\' 9"  (1.753 m) and weight is 160 lb 9.6 oz (72.8 kg). Her temperature is 97.4 F (36.3 C) (abnormal). Her blood pressure is 130/90 (abnormal) and her pulse is 60. Her respiration is 18 and oxygen saturation is 100%.   In general this is a well appearing in no acute distress. She alert and oriented x4 and appropriate throughout the examination. Cardiopulmonary assessment is negative for acute distress and she exhibits normal effort.       LABORATORY DATA:  Lab Results  Component Value Date   WBC 6.5 09/15/2022   HGB 14.3 09/15/2022   HCT 42.0 09/15/2022   MCV 91.2 09/15/2022   PLT 212 09/15/2022   CMP     Component Value Date/Time   NA 139 09/15/2022 1025   K 4.3 09/15/2022 1025   CL 106 09/15/2022 1025   CO2 27 04/21/2022 1103   GLUCOSE 91 09/15/2022 1025   BUN 21 (H) 09/20/2022 1320   CREATININE 1.02 (H) 09/20/2022 1320   CALCIUM 9.1 04/21/2022 1103   PROT 7.1 04/21/2022 1103   ALBUMIN 4.0 04/21/2022 1103   AST 19 04/21/2022 1103   ALT 16 04/21/2022 1103   ALKPHOS 80 04/21/2022 1103   BILITOT 0.5 04/21/2022 1103   GFRNONAA >60 09/20/2022 1320         RADIOGRAPHY: MR BRAIN W WO CONTRAST  Result Date: 09/15/2022 CLINICAL DATA:  Brain metastases, unknown primary EXAM: MRI HEAD WITHOUT AND WITH CONTRAST TECHNIQUE: Multiplanar, multiecho pulse sequences of the brain and surrounding structures were obtained without and with intravenous contrast. CONTRAST:  7mL GADAVIST GADOBUTROL 1 MMOL/ML IV SOLN COMPARISON:  Same-day CT head and neck angiogram FINDINGS: Brain: There is a 3.4 x 2.5 x 3.3 cm contrast-enhancing mass centered around the left caudate head. Contrast enhancement extends along the ependymal margin of the left frontal horn (series 1100, image 193). There is extensive vasogenic edema surrounding this mass involving a large portion of the left frontal lobe, extending along the cortical spinal tracts into the cerebral peduncles, and medially  across midline along the genu of the corpus callosum (series 4, image 29). There is asymmetric enlargement of the right temporal horn with periventricular T2/FLAIR hyperintense signal abnormality, which raises the possibility of ventricular entrapment. Negative for an acute infarct. No hemorrhage. No extra-axial fluid collection. Vascular: Normal flow voids. Skull and upper cervical spine: Normal marrow signal. Sinuses/Orbits: Negative. Other: None. IMPRESSION: 1. There is a 3.4 x 2.5 x 3.3 cm contrast-enhancing mass centered around the left caudate head with extensive surrounding vasogenic edema, as above. Contrast enhancement extends along the ependymal margin of the left frontal horn. Given patient's history, metastatic disease is a differential consideration. However, this degree of T2/FLAIR signal abnormality is somewhat atypical for metastatic disease, particularly infiltration along the corticospinal tracts, as well as across the genu of the corpus callosum; an additional differential consideration is a high grade glioma. 2. Asymmetric enlargement of the right temporal horn with periventricular T2/FLAIR hyperintense signal abnormality, which raises  the possibility of ventricular entrapment. Electronically Signed   By: Lorenza Cambridge M.D.   On: 09/15/2022 19:01   CT CHEST ABDOMEN PELVIS W CONTRAST  Result Date: 09/15/2022 CLINICAL DATA:  Metastatic disease evaluation, primary search, brain mass, history of breast cancer * Tracking Code: BO * EXAM: CT CHEST, ABDOMEN, AND PELVIS WITH CONTRAST TECHNIQUE: Multidetector CT imaging of the chest, abdomen and pelvis was performed following the standard protocol during bolus administration of intravenous contrast. RADIATION DOSE REDUCTION: This exam was performed according to the departmental dose-optimization program which includes automated exposure control, adjustment of the mA and/or kV according to patient size and/or use of iterative reconstruction technique.  CONTRAST:  80mL OMNIPAQUE IOHEXOL 300 MG/ML SOLN additional oral enteric contrast COMPARISON:  CT chest abdomen pelvis, 04/27/2021 FINDINGS: CT CHEST FINDINGS Cardiovascular: No significant vascular findings. Normal heart size. No pericardial effusion. Mediastinum/Nodes: Status post right axillary lymph node dissection. No enlarged mediastinal, hilar, or axillary lymph nodes. Thymic remnant in the anterior mediastinum. Thyroid gland, trachea, and esophagus demonstrate no significant findings. Lungs/Pleura: Minimal scattered subpleural ground-glass of the anterior right upper lobe and right middle lobe (series 6, image 95). Minimal consolidation of the right apex (series 6, image 16). No pleural effusion or pneumothorax. Musculoskeletal: Status post right mastectomy with implant reconstruction and left prepectoral breast implant. No acute osseous findings. CT ABDOMEN PELVIS FINDINGS Hepatobiliary: No solid liver abnormality is seen. No gallstones, gallbladder wall thickening, or biliary dilatation. Pancreas: Unremarkable. No pancreatic ductal dilatation or surrounding inflammatory changes. Spleen: Normal in size without significant abnormality. Adrenals/Urinary Tract: Adrenal glands are unremarkable. Kidneys are normal, without renal calculi, solid lesion, or hydronephrosis. Excreted contrast in the urinary bladder. Stomach/Bowel: Stomach is within normal limits. Appendix not clearly visualized. No evidence of bowel wall thickening, distention, or inflammatory changes. Moderate burden of stool throughout the colon. Vascular/Lymphatic: No significant vascular findings are present. No enlarged abdominal or pelvic lymph nodes. Reproductive: No mass or other abnormality. Other: No abdominal wall hernia or abnormality. No ascites. Musculoskeletal: No acute osseous findings. IMPRESSION: 1. Status post right mastectomy with implant reconstruction and left prepectoral breast implant. 2. No evidence of lymphadenopathy or  metastatic disease in the chest, abdomen, or pelvis. 3. Minimal scattered subpleural ground-glass of the anterior right upper lobe and right middle lobe. Minimal consolidation of the right apex. 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 Electronically Signed   By: Jearld Lesch M.D.   On: 09/15/2022 14:09   CT ANGIO HEAD NECK W WO CM  Result Date: 09/15/2022 CLINICAL DATA:  Diplopia. EXAM: CT ANGIOGRAPHY HEAD AND NECK WITH AND WITHOUT CONTRAST TECHNIQUE: Multidetector CT imaging of the head and neck was performed using the standard protocol during bolus administration of intravenous contrast. Multiplanar CT image reconstructions and MIPs were obtained to evaluate the vascular anatomy. Carotid stenosis measurements (when applicable) are obtained utilizing NASCET criteria, using the distal internal carotid diameter as the denominator. RADIATION DOSE REDUCTION: This exam was performed according to the departmental dose-optimization program which includes automated exposure control, adjustment of the mA and/or kV according to patient size and/or use of iterative reconstruction technique. CONTRAST:  75mL OMNIPAQUE IOHEXOL 350 MG/ML SOLN COMPARISON:  None Available. FINDINGS: CT HEAD FINDINGS Brain: 35 x 30 mm enhancing mass with surrounding vasogenic edema centered within the left caudate (axial image 15 series 5, image 16 series 21), suspicious for metastasis. Cortical gray-white differentiation is preserved. No acute hemorrhage. Associated 13 mm of rightward midline shift and diffuse sulcal  effacement. Vascular: No hyperdense vessel or unexpected calcification. Skull: No calvarial fracture or suspicious bone lesion. Skull base is unremarkable. Sinuses/Orbits: Unremarkable. Other: None. Review of the MIP images confirms the above findings CTA NECK FINDINGS Aortic arch: Three-vessel arch configuration. Arch vessel origins are patent. Right carotid system: No evidence of  dissection, stenosis (50% or greater), or occlusion. Left carotid system: No evidence of dissection, stenosis (50% or greater), or occlusion. Vertebral arteries: Left dominant. No evidence of dissection, stenosis (50% or greater), or occlusion. Skeleton: Unremarkable. Other neck: Unremarkable. Upper chest: Unremarkable. Review of the MIP images confirms the above findings CTA HEAD FINDINGS Anterior circulation: Intracranial ICAs are patent without stenosis or aneurysm. The proximal ACAs and MCAs are patent without stenosis or aneurysm. Distal branches are symmetric. Posterior circulation: Normal basilar artery. The SCAs, AICAs and PICAs are patent proximally. The PCAs are patent proximally without stenosis or aneurysm. Distal branches are symmetric. Venous sinuses: As permitted by contrast timing, patent. Anatomic variants: Persistent fetal origin of the bilateral PCAs with hypoplastic P1 segments bilaterally. Review of the MIP images confirms the above findings IMPRESSION: 1. 35 x 30 mm enhancing mass with surrounding vasogenic edema centered within the left caudate, suspicious for metastasis. Associated 13 mm of rightward midline shift and diffuse sulcal effacement. 2. No large vessel occlusion, hemodynamically significant stenosis, or evidence of dissection in the head or neck vessels. Electronically Signed   By: Orvan Falconer M.D.   On: 09/15/2022 12:24      IMPRESSION/PLAN: This is a very pleasant 32 year old with metastatic disease to the brain.  It was a pleasure seeing this patient and her husband again today. Recent imaging fortunately showed no further disease in the brain. I personally reviewed these images and shared these results with the patient.  We discussed her case at our brain tumor conference Monday and it was agreed that this mass is likely a metastasis originating from a breast primary, considering her medical history. Therefore, it is reasonable to avoid the risks associated with a  brain biopsy and proceed with stereotactic radiosurgery [facility]) as treatment.   We again reviewed spoke SRS therapy to the brain. We spoke about the risks benefits and side effects of this treatment. We spoke about possible headaches, nausea, hair loss, fatigue, edema and possible radionecrosis that can result from [facility]. I explained that stereotactic radiosurgery only treats the areas of gross disease while sparing the rest of the brain parenchyma.  After lengthy discussion, the patient would like to proceed with stereotactic brain radiosurgery to their metastatic disease. She expressed understanding that this treatment is of curative intent.   CT simulation will take place on 09/24/22 and treatment on 09/29/22.  I plan to deliver 27 Gy in 3 fractions to the visualized brain mass.  We refilled her Decadron (7 mg BID) today. Patient will be referred to Dr. Barbaraann Cao.   On date of service, in total, I spent 40 minutes on this encounter. Patient was seen in person.  __________________________________________   Joyice Faster, PA-C    Lonie Peak, MD  This document serves as a record of services personally performed by Lonie Peak, MD. It was created on her behalf by Neena Rhymes, a trained medical scribe. The creation of this record is based on the scribe's personal observations and the provider's statements to them. This document has been checked and approved by the attending provider.

## 2022-09-21 ENCOUNTER — Ambulatory Visit
Admission: RE | Admit: 2022-09-21 | Discharge: 2022-09-21 | Disposition: A | Discharge status: Home / Self Care | Payer: Commercial Managed Care - PPO | Source: Ambulatory Visit | Attending: Radiation Oncology | Admitting: Radiation Oncology

## 2022-09-21 ENCOUNTER — Other Ambulatory Visit: Payer: Self-pay

## 2022-09-21 ENCOUNTER — Encounter: Payer: Self-pay | Admitting: Radiation Oncology

## 2022-09-21 ENCOUNTER — Other Ambulatory Visit: Payer: Self-pay | Admitting: Radiation Therapy

## 2022-09-21 VITALS — BP 130/90 | HR 60 | Temp 97.4°F | Resp 18 | Ht 69.0 in | Wt 160.6 lb

## 2022-09-21 DIAGNOSIS — C7931 Secondary malignant neoplasm of brain: Secondary | ICD-10-CM

## 2022-09-21 DIAGNOSIS — H532 Diplopia: Secondary | ICD-10-CM | POA: Diagnosis not present

## 2022-09-21 DIAGNOSIS — Z7952 Long term (current) use of systemic steroids: Secondary | ICD-10-CM | POA: Diagnosis not present

## 2022-09-21 DIAGNOSIS — C50411 Malignant neoplasm of upper-outer quadrant of right female breast: Secondary | ICD-10-CM | POA: Insufficient documentation

## 2022-09-21 DIAGNOSIS — Z923 Personal history of irradiation: Secondary | ICD-10-CM | POA: Diagnosis not present

## 2022-09-21 DIAGNOSIS — Z803 Family history of malignant neoplasm of breast: Secondary | ICD-10-CM | POA: Insufficient documentation

## 2022-09-21 DIAGNOSIS — Z86718 Personal history of other venous thrombosis and embolism: Secondary | ICD-10-CM | POA: Diagnosis not present

## 2022-09-21 DIAGNOSIS — Z171 Estrogen receptor negative status [ER-]: Secondary | ICD-10-CM | POA: Diagnosis not present

## 2022-09-21 MED ORDER — DEXAMETHASONE 4 MG PO TABS
4.0000 mg | ORAL_TABLET | Freq: Two times a day (BID) | ORAL | 0 refills | Status: DC
Start: 2022-09-21 — End: 2023-01-24

## 2022-09-22 ENCOUNTER — Ambulatory Visit: Payer: Commercial Managed Care - PPO | Admitting: Radiation Oncology

## 2022-09-22 ENCOUNTER — Encounter: Payer: Self-pay | Admitting: Radiation Oncology

## 2022-09-22 ENCOUNTER — Telehealth: Payer: Self-pay | Admitting: *Deleted

## 2022-09-22 NOTE — Telephone Encounter (Signed)
Patient called - LVM: Asked if she needed Breast MRI done right now? It is scheduled for Monday 09/27/22. Per Dr. Pamelia Hoit, the MRI can be cancelled. Appt cancelled.  Contacted patient - left voice mail that MRI not needed and appt cancelled. Encouraged patient to contact office for questions or concerns.

## 2022-09-23 ENCOUNTER — Encounter: Payer: Self-pay | Admitting: Hematology and Oncology

## 2022-09-24 ENCOUNTER — Telehealth: Payer: Self-pay | Admitting: Radiation Oncology

## 2022-09-24 ENCOUNTER — Ambulatory Visit: Payer: Commercial Managed Care - PPO

## 2022-09-24 ENCOUNTER — Ambulatory Visit: Payer: Commercial Managed Care - PPO | Admitting: Radiation Oncology

## 2022-09-24 ENCOUNTER — Telehealth: Payer: Self-pay

## 2022-09-24 ENCOUNTER — Encounter: Payer: Self-pay | Admitting: Radiation Oncology

## 2022-09-24 NOTE — Telephone Encounter (Signed)
6/28 @ 8:15 am patient called to cancel all treatment appointments until further notice.  She is undecided about having her care here at [facility] or [facility].  Email sent to Dr. Basilio Cairo, Jalene Mullet and Zerita Boers so they are aware.

## 2022-09-24 NOTE — Progress Notes (Signed)
I spoke with Tara White this morning.  She had consultations with radiation oncology and neurosurgery at [facility] this week and has decided to proceed with stereotactic radiosurgery there.  She states that there will be a minor delay in starting her treatment compared to our timeline here, but she feels good about initiating treatment with the team at [facility] and is looking forward to her consultation with Dr. Caswell Corwin of medical oncology next week.  I wished her the very best and she knows to contact us anytime she needs [place].  We will cancel today's radiosurgery simulation. -----------------------------------  Lonie Peak, MD

## 2022-09-24 NOTE — Telephone Encounter (Signed)
Pt returned call and states she has appt with Dr Caswell Corwin 7/1. Once she has that appt she will f/u with [place] to let us know the plan. From that point we will schedule a f/u withj Dr Pamelia Hoit to initiate systemic therapy. She agrees with this plan.

## 2022-09-27 ENCOUNTER — Ambulatory Visit (HOSPITAL_COMMUNITY): Payer: Commercial Managed Care - PPO

## 2022-09-29 ENCOUNTER — Ambulatory Visit: Payer: Commercial Managed Care - PPO | Admitting: Radiation Oncology

## 2022-09-29 ENCOUNTER — Encounter: Payer: Self-pay | Admitting: Hematology and Oncology

## 2022-10-01 ENCOUNTER — Telehealth: Payer: Self-pay

## 2022-10-01 ENCOUNTER — Ambulatory Visit: Payer: Commercial Managed Care - PPO | Admitting: Radiation Oncology

## 2022-10-01 NOTE — Telephone Encounter (Signed)
Called pt per [employer]. Per MD's last note, pt was given names of drugs being considered for systemic tx pending her visit with Dr Caswell Corwin at [facility]. She was also provided info on drugs per [email or web address] to further read on medications as she is trying to make the best decision for her and her family. She would like an MD visit with Dr Pamelia Hoit after she sees Dr Caswell Corwin 7/23. She was provided an appt for 7/25 at 1045.

## 2022-10-04 ENCOUNTER — Ambulatory Visit: Payer: Commercial Managed Care - PPO | Admitting: Internal Medicine

## 2022-10-04 ENCOUNTER — Ambulatory Visit: Payer: Commercial Managed Care - PPO | Admitting: Radiation Oncology

## 2022-10-05 ENCOUNTER — Other Ambulatory Visit: Payer: Self-pay

## 2022-10-05 DIAGNOSIS — C7931 Secondary malignant neoplasm of brain: Secondary | ICD-10-CM

## 2022-10-05 DIAGNOSIS — Z803 Family history of malignant neoplasm of breast: Secondary | ICD-10-CM

## 2022-10-05 DIAGNOSIS — Z171 Estrogen receptor negative status [ER-]: Secondary | ICD-10-CM

## 2022-10-18 ENCOUNTER — Encounter: Payer: Self-pay | Admitting: Hematology and Oncology

## 2022-10-20 NOTE — Progress Notes (Signed)
Patient Care Team: Samuella Bruin as PCP - General (Physician Assistant) Serena Croissant, MD as Consulting Physician (Hematology and Oncology) Abigail Miyamoto, MD as Consulting Physician (General Surgery) Lonie Peak, MD as Attending Physician (Radiation Oncology)  DIAGNOSIS: No diagnosis found.  SUMMARY OF ONCOLOGIC HISTORY: Oncology History  Malignant neoplasm of upper-outer quadrant of right breast in female, estrogen receptor negative (HCC)  04/08/2021 Initial Diagnosis   History of right breast eczematous change to the nipple, patient had palpable lump in the right breast her mammogram 2.1 cm spiculated mass with extensive calcifications that span 11 cm.  Ultrasound revealed 3 cm mass at 10:00 plus small satellite lesions.  Biopsy revealed grade 3 IDC ER 0%, PR 0%, HER2 3+ positive, Ki-67 30%   04/08/2021 Cancer Staging   Staging form: Breast, AJCC 8th Edition - Clinical stage from 04/08/2021: Stage IIB (cT2, cN1, cM0, G3, ER-, PR-, HER2+) - Signed by Serena Croissant, MD on 04/15/2021 Stage prefix: Initial diagnosis Method of lymph node assessment: Axillary lymph node dissection Histologic grading system: 3 grade system   05/01/2021 - 11/26/2021 Chemotherapy   Patient is on Treatment Plan : BREAST  Docetaxel + Carboplatin + Trastuzumab + Pertuzumab  (TCHP) q21d      05/21/2021 Genetic Testing   Negative hereditary cancer genetic testing: no pathogenic variants detected in [facility] CustomNext-Cancer +RNAinsight Panel.  Report date is 05/21/2021.   The CustomNext-Cancer+RNAinsight panel offered by [employer] includes sequencing and rearrangement analysis for the following 47 genes:  APC, ATM, AXIN2, BARD1, BMPR1A, BRCA1, BRCA2, BRIP1, CDH1, CDK4, CDKN2A, CHEK2, DICER1, EPCAM, GREM1, HOXB13, MEN1, MLH1, MSH2, MSH3, MSH6, MUTYH, NBN, NF1, NF2, NTHL1, PALB2, PMS2, POLD1, POLE, PTEN, RAD51C, RAD51D, RECQL, RET, SDHA, SDHAF2, SDHB, SDHC, SDHD, SMAD4, SMARCA4, STK11, TP53, TSC1, TSC2, and  VHL.  RNA data is routinely analyzed for use in variant interpretation for all genes.   09/04/2021 - 04/21/2022 Chemotherapy   Patient is on Treatment Plan : BREAST Trastuzumab  + Pertuzumab q21d x 13 cycles     10/13/2021 Surgery   right mastectomy with reconstruction: No residual malignancy complete pathologic response 0/5 lymph nodes negative    12/03/2021 - 01/11/2022 Radiation Therapy   Site Technique Total Dose (Gy) Dose per Fx (Gy) Completed Fx Beam Energies  Chest Wall, Right: CW_R_IMN 3D 50.4/50.4 1.8 28/28 6XFFF  Chest Wall, Right: CW_R_PAB_SCV 3D 50.4/50.4 1.8 28/28 6X, 10X       CHIEF COMPLIANT:   INTERVAL HISTORY: Tara White is a   ALLERGIES:  has No Known Allergies.  MEDICATIONS:  Current Outpatient Medications  Medication Sig Dispense Refill   carboxymethylcellulose (REFRESH TEARS) 0.5 % SOLN Place 1 drop into both eyes 3 (three) times daily as needed (Dry eye).     dexamethasone (DECADRON) 4 MG tablet Take 1 tablet (4 mg total) by mouth 2 (two) times daily with a meal. 60 tablet 0   No current facility-administered medications for this visit.    PHYSICAL EXAMINATION: ECOG PERFORMANCE STATUS: {CHL ONC ECOG [phone]}  There were no vitals filed for this visit. There were no vitals filed for this visit.  BREAST:*** No palpable masses or nodules in either right or left breasts. No palpable axillary supraclavicular or infraclavicular adenopathy no breast tenderness or nipple discharge. (exam performed in the presence of a chaperone)  LABORATORY DATA:  I have reviewed the data as listed    Latest Ref Rng & Units 09/20/2022    1:20 PM 09/15/2022   10:25 AM 04/21/2022  11:03 AM  CMP  Glucose 70 - 99 mg/dL  91  78   BUN 6 - 20 mg/dL 21  18  22    Creatinine 0.44 - 1.00 mg/dL 1.91  4.78  2.95   Sodium 135 - 145 mmol/L  139  137   Potassium 3.5 - 5.1 mmol/L  4.3  4.1   Chloride 98 - 111 mmol/L  106  105   CO2 22 - 32 mmol/L   27   Calcium 8.9 - 10.3  mg/dL   9.1   Total Protein 6.5 - 8.1 g/dL   7.1   Total Bilirubin 0.3 - 1.2 mg/dL   0.5   Alkaline Phos 38 - 126 U/L   80   AST 15 - 41 U/L   19   ALT 0 - 44 U/L   16     Lab Results  Component Value Date   WBC 6.5 09/15/2022   HGB 14.3 09/15/2022   HCT 42.0 09/15/2022   MCV 91.2 09/15/2022   PLT 212 09/15/2022   NEUTROABS 4.3 09/15/2022    ASSESSMENT & PLAN:  No problem-specific Assessment & Plan notes found for this encounter.    No orders of the defined types were placed in this encounter.  The patient has a good understanding of the overall plan. she agrees with it. she will call with any problems that may develop before the next visit here. Total time spent: 30 mins including face to face time and time spent for planning, charting and co-ordination of care   Sherlyn Lick, CMA 10/20/22    I Janan Ridge am acting as a Neurosurgeon for [employer]  ***

## 2022-10-21 ENCOUNTER — Other Ambulatory Visit: Payer: Self-pay

## 2022-10-21 ENCOUNTER — Inpatient Hospital Stay: Payer: Commercial Managed Care - PPO | Attending: Adult Health | Admitting: Hematology and Oncology

## 2022-10-21 ENCOUNTER — Inpatient Hospital Stay (HOSPITAL_BASED_OUTPATIENT_CLINIC_OR_DEPARTMENT_OTHER): Payer: Commercial Managed Care - PPO | Admitting: Internal Medicine

## 2022-10-21 VITALS — BP 126/86 | HR 56 | Temp 97.6°F | Resp 17 | Wt 163.6 lb

## 2022-10-21 DIAGNOSIS — C50411 Malignant neoplasm of upper-outer quadrant of right female breast: Secondary | ICD-10-CM | POA: Insufficient documentation

## 2022-10-21 DIAGNOSIS — Z9221 Personal history of antineoplastic chemotherapy: Secondary | ICD-10-CM | POA: Insufficient documentation

## 2022-10-21 DIAGNOSIS — C7931 Secondary malignant neoplasm of brain: Secondary | ICD-10-CM

## 2022-10-21 DIAGNOSIS — Z79899 Other long term (current) drug therapy: Secondary | ICD-10-CM | POA: Insufficient documentation

## 2022-10-21 DIAGNOSIS — Z171 Estrogen receptor negative status [ER-]: Secondary | ICD-10-CM | POA: Diagnosis not present

## 2022-10-21 NOTE — Assessment & Plan Note (Signed)
Hospitalization: 09/15/2022-09/16/2022: Diplopia: Newly diagnosed brain metastases: 3.74 cm left caudate lobe lesion, not a surgical candidate.   (04/08/2021: ER 0%, PR 0%, HER2 3+ positive right breast cancer status post neoadjuvant chemotherapy with TCH Perjeta: Pathologic complete response, adjuvant radiation, Herceptin and Perjeta maintenance completed 04/21/2021)   Treatment plan: 10/11/2022-10/15/2022: SRS to the brain Systemic therapy needs to be decided.

## 2022-10-21 NOTE — Progress Notes (Signed)
[facility] [address]  [place], [place] [address] [phone]   New Patient Evaluation  Date of Service: 10/21/22 Patient Name: Clova Morlock Patient MRN: [id number] Patient DOB: 1990-06-06 Provider: Henreitta Leber, MD  Identifying Statement:  Dafna Romo is a 32 y.o. female with Metastasis to brain [facility]) who presents for initial consultation and evaluation regarding cancer associated neurologic deficits.    Referring Provider: Serena Croissant, MD [address] [place],  [place] [address]  Primary Cancer:  Oncologic History: Oncology History  Malignant neoplasm of upper-outer quadrant of right breast in female, estrogen receptor negative (HCC)  04/08/2021 Initial Diagnosis   History of right breast eczematous change to the nipple, patient had palpable lump in the right breast her mammogram 2.1 cm spiculated mass with extensive calcifications that span 11 cm.  Ultrasound revealed 3 cm mass at 10:00 plus small satellite lesions.  Biopsy revealed grade 3 IDC ER 0%, PR 0%, HER2 3+ positive, Ki-67 30%   04/08/2021 Cancer Staging   Staging form: Breast, AJCC 8th Edition - Clinical stage from 04/08/2021: Stage IIB (cT2, cN1, cM0, G3, ER-, PR-, HER2+) - Signed by Serena Croissant, MD on 04/15/2021 Stage prefix: Initial diagnosis Method of lymph node assessment: Axillary lymph node dissection Histologic grading system: 3 grade system   05/01/2021 - 11/26/2021 Chemotherapy   Patient is on Treatment Plan : BREAST  Docetaxel + Carboplatin + Trastuzumab + Pertuzumab  (TCHP) q21d      05/21/2021 Genetic Testing   Negative hereditary cancer genetic testing: no pathogenic variants detected in [facility] CustomNext-Cancer +RNAinsight Panel.  Report date is 05/21/2021.   The CustomNext-Cancer+RNAinsight panel offered by [employer] includes sequencing and rearrangement analysis for the following 47 genes:  APC, ATM, AXIN2, BARD1, BMPR1A,  BRCA1, BRCA2, BRIP1, CDH1, CDK4, CDKN2A, CHEK2, DICER1, EPCAM, GREM1, HOXB13, MEN1, MLH1, MSH2, MSH3, MSH6, MUTYH, NBN, NF1, NF2, NTHL1, PALB2, PMS2, POLD1, POLE, PTEN, RAD51C, RAD51D, RECQL, RET, SDHA, SDHAF2, SDHB, SDHC, SDHD, SMAD4, SMARCA4, STK11, TP53, TSC1, TSC2, and VHL.  RNA data is routinely analyzed for use in variant interpretation for all genes.   09/04/2021 - 04/21/2022 Chemotherapy   Patient is on Treatment Plan : BREAST Trastuzumab  + Pertuzumab q21d x 13 cycles     10/13/2021 Surgery   right mastectomy with reconstruction: No residual malignancy complete pathologic response 0/5 lymph nodes negative    12/03/2021 - 01/11/2022 Radiation Therapy   Site Technique Total Dose (Gy) Dose per Fx (Gy) Completed Fx Beam Energies  Chest Wall, Right: CW_R_IMN 3D 50.4/50.4 1.8 28/28 6XFFF  Chest Wall, Right: CW_R_PAB_SCV 3D 50.4/50.4 1.8 28/28 6X, 10X      CNS Oncologic History 10/15/22: Completes fractionated SRS to left caudate head metastasis  History of Present Illness: The patient's records from the referring physician were obtained and reviewed and the patient interviewed to confirm this HPI.  Yurika Angelene Giovanni presented to neurologic attention in mid-June with ~1 week history of double vision and neck pain.  CNS imaging demonstrated large left frontal mass consistent with metastasis from breast cancer.  She recently completed radiation therapy at [facility].  Currently feeling normal, at baseline aside from swelling in legs and face from decadron.  Currently dosign 6mg  daily with taper schedule in place.  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  Medications: Current Outpatient Medications on File Prior to Visit  Medication Sig Dispense Refill   carboxymethylcellulose (REFRESH  TEARS) 0.5 % SOLN Place 1 drop into both eyes 3 (three) times daily as needed (Dry eye).     dexamethasone (DECADRON) 4 MG tablet Take 1 tablet (4 mg total) by mouth 2  (two) times daily with a meal. 60 tablet 0   No current facility-administered medications on file prior to visit.    Allergies: No Known Allergies Past Medical History:  Past Medical History:  Diagnosis Date   DVT (deep venous thrombosis) (HCC) 06/25/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   Family history of breast cancer 04/21/2021   right breast ca 03/2021   Past Surgical History:  Past Surgical History:  Procedure Laterality Date   BREAST ENHANCEMENT SURGERY Left 06/25/2022   Procedure: MAMMOPLASTY AUGMENTATION, MASTOPEXY (BREAST);  Surgeon: Glenna Fellows, MD;  Location: [facility];  Service: Plastics;  Laterality: Left;   BREAST RECONSTRUCTION WITH PLACEMENT OF TISSUE EXPANDER AND ALLODERM Right 10/13/2021   Procedure: RIGHT BREAST RECONSTRUCTION WITH PLACEMENT OF TISSUE EXPANDER AND ALLODERM;  Surgeon: Glenna Fellows, MD;  Location: [facility];  Service: Plastics;  Laterality: Right;   CESAREAN SECTION N/A 12/10/2015   Procedure: CESAREAN SECTION;  Surgeon: Noland Fordyce, MD;  Location: [facility];  Service: Obstetrics;  Laterality: N/A;   IR CV LINE INJECTION  06/25/2021   IR RADIOLOGY PERIPHERAL GUIDED IV START  06/25/2021   IR US GUIDE VASC ACCESS LEFT  06/25/2021   IR VENO/EXT/UNI LEFT  06/25/2021   NODE DISSECTION Right 10/13/2021   Procedure: TARGETED LYMPH NODE DISSECTION;  Surgeon: Abigail Miyamoto, MD;  Location: [facility];  Service: General;  Laterality: Right;   PORT-A-CATH REMOVAL Left 05/27/2022   Procedure: REMOVAL PORT-A-CATH;  Surgeon: Abigail Miyamoto, MD;  Location: [facility];  Service: General;  Laterality: Left;   PORTACATH PLACEMENT Left 04/30/2021   Procedure: INSERTION PORT-A-CATH;  Surgeon: Abigail Miyamoto, MD;  Location: [facility];  Service: General;  Laterality: Left;   REMOVAL OF TISSUE EXPANDER AND PLACEMENT OF IMPLANT Right 06/25/2022   Procedure: REMOVAL OF TISSUE EXPANDER AND PLACEMENT OF IMPLANT;   Surgeon: Glenna Fellows, MD;  Location: [facility];  Service: Plastics;  Laterality: Right;   SIMPLE MASTECTOMY WITH AXILLARY SENTINEL NODE BIOPSY Right 10/13/2021   Procedure: RIGHT MASTECTOMY;  Surgeon: Abigail Miyamoto, MD;  Location: [facility];  Service: General;  Laterality: Right;   WISDOM TOOTH EXTRACTION     Social History:  Social History   Socioeconomic History   Marital status: Married    Spouse name: Not on file   Number of children: Not on file   Years of education: Not on file   Highest education level: Not on file  Occupational History   Not on file  Tobacco Use   Smoking status: Never   Smokeless tobacco: Never  Vaping Use   Vaping status: Not on file  Substance and Sexual Activity   Alcohol use: Yes    Alcohol/week: 2.0 standard drinks of alcohol    Types: 2 Standard drinks or equivalent per week    Comment: rarely   Drug use: No   Sexual activity: Not Currently    Partners: Male    Birth control/protection: Other-see comments    Comment: cycles have not resumed since receiving chemotherapy  Other Topics Concern   Not on file  Social History Narrative   Not on file   Social Determinants of Health   Financial Resource Strain: Not on file  Food Insecurity: No Food Insecurity (09/15/2022)  Hunger Vital Sign    Worried About Running Out of Food in the Last Year: Never true    Ran Out of Food in the Last Year: Never true  Transportation Needs: No Transportation Needs (09/15/2022)   PRAPARE - Administrator, Civil Service (Medical): No    Lack of Transportation (Non-Medical): No  Physical Activity: Not on file  Stress: Not on file  Social Connections: Unknown (07/27/2021)   Received from [facility]   Social Network    Social Network: Not on file  Intimate Partner Violence: Not At Risk (09/15/2022)   Humiliation, Afraid, Rape, and Kick questionnaire    Fear of Current or Ex-Partner: No    Emotionally Abused: No     Physically Abused: No    Sexually Abused: No   Family History:  Family History  Problem Relation Age of Onset   Hypothyroidism Mother    Breast cancer Maternal Grandmother 15   Skin cancer Maternal Grandfather    Heart failure Paternal Grandfather     Review of Systems: Constitutional: Doesn't report fevers, chills or abnormal weight loss Eyes: Doesn't report blurriness of vision Ears, nose, mouth, throat, and face: Doesn't report sore throat Respiratory: Doesn't report cough, dyspnea or wheezes Cardiovascular: Doesn't report palpitation, chest discomfort  Gastrointestinal:  Doesn't report nausea, constipation, diarrhea GU: Doesn't report incontinence Skin: Doesn't report skin rashes Neurological: Per HPI Musculoskeletal: Doesn't report joint pain Behavioral/Psych: Doesn't report anxiety  Physical Exam: Vitals:   10/21/22 0948  BP: 126/86  Pulse: (!) 56  Resp: 17  Temp: 97.6 F (36.4 C)  SpO2: 100%   KPS: 90. General: Alert, cooperative, pleasant, in no acute distress Head: Normal EENT: No conjunctival injection or scleral icterus.  Lungs: Resp effort normal Cardiac: Regular rate Abdomen: Non-distended abdomen Skin: No rashes cyanosis or petechiae. Extremities: No clubbing or edema  Neurologic Exam: Mental Status: Awake, alert, attentive to examiner. Oriented to self and environment. Language is fluent with intact comprehension.  Cranial Nerves: Visual acuity is grossly normal. Visual fields are full. Extra-ocular movements intact. No ptosis. Face is symmetric Motor: Tone and bulk are normal. Power is full in both arms and legs. Reflexes are symmetric, no pathologic reflexes present.  Sensory: Intact to light touch Gait: Normal.   Labs: I have reviewed the data as listed    Component Value Date/Time   NA 139 09/15/2022 1025   K 4.3 09/15/2022 1025   CL 106 09/15/2022 1025   CO2 27 04/21/2022 1103   GLUCOSE 91 09/15/2022 1025   BUN 21 (H) 09/20/2022  1320   CREATININE 1.02 (H) 09/20/2022 1320   CALCIUM 9.1 04/21/2022 1103   PROT 7.1 04/21/2022 1103   ALBUMIN 4.0 04/21/2022 1103   AST 19 04/21/2022 1103   ALT 16 04/21/2022 1103   ALKPHOS 80 04/21/2022 1103   BILITOT 0.5 04/21/2022 1103   GFRNONAA >60 09/20/2022 1320   GFRAA >60 12/17/2015 0610   Lab Results  Component Value Date   WBC 6.5 09/15/2022   NEUTROABS 4.3 09/15/2022   HGB 14.3 09/15/2022   HCT 42.0 09/15/2022   MCV 91.2 09/15/2022   PLT 212 09/15/2022    Imaging:  CLINICAL DATA:  Brain metastases, unknown primary   EXAM: MRI HEAD WITHOUT AND WITH CONTRAST   TECHNIQUE: Multiplanar, multiecho pulse sequences of the brain and surrounding structures were obtained without and with intravenous contrast.   CONTRAST:  7mL GADAVIST GADOBUTROL 1 MMOL/ML IV SOLN   COMPARISON:  Same-day CT head and neck angiogram   FINDINGS: Brain: There is a 3.4 x 2.5 x 3.3 cm contrast-enhancing mass centered around the left caudate head. Contrast enhancement extends along the ependymal margin of the left frontal horn (series 1100, image 193). There is extensive vasogenic edema surrounding this mass involving a large portion of the left frontal lobe, extending along the cortical spinal tracts into the cerebral peduncles, and medially across midline along the genu of the corpus callosum (series 4, image 29). There is asymmetric enlargement of the right temporal horn with periventricular T2/FLAIR hyperintense signal abnormality, which raises the possibility of ventricular entrapment. Negative for an acute infarct. No hemorrhage. No extra-axial fluid collection.   Vascular: Normal flow voids.   Skull and upper cervical spine: Normal marrow signal.   Sinuses/Orbits: Negative.   Other: None.   IMPRESSION: 1. There is a 3.4 x 2.5 x 3.3 cm contrast-enhancing mass centered around the left caudate head with extensive surrounding vasogenic edema, as above. Contrast enhancement  extends along the ependymal margin of the left frontal horn. Given patient's history, metastatic disease is a differential consideration. However, this degree of T2/FLAIR signal abnormality is somewhat atypical for metastatic disease, particularly infiltration along the corticospinal tracts, as well as across the genu of the corpus callosum; an additional differential consideration is a high grade glioma. 2. Asymmetric enlargement of the right temporal horn with periventricular T2/FLAIR hyperintense signal abnormality, which raises the possibility of ventricular entrapment.     Electronically Signed   By: Lorenza Cambridge M.D.   On: 09/15/2022 19:01   Assessment/Plan No diagnosis found.  Robbye Angelene Giovanni presents with clinical and radiographic syndrome localizing to mesial left frontal subcortex; etiology is metastasis from breast cancer.    She has non-focal exam at this time.    She has completed fractionated stereotactic radiation therapy, underwent treatement at [facility].    Will con't to follow with Dr. Caswell Corwin and Dr. Pamelia Hoit for breast cancer systemic therapy.  Will complete decadron taper as scheduled.  We spent twenty additional minutes teaching regarding the natural history, biology, and historical experience in the treatment of neurologic complications of cancer.   We appreciate the opportunity to participate in the care of Kinzy Dorthy Magnussen.  She may follow up with [place] as needed with new or progressive symptoms.  All questions were answered. The patient knows to call the clinic with any problems, questions or concerns. No barriers to learning were detected.  The total time spent in the encounter was 60 minutes and more than 50% was on counseling and review of test results   Henreitta Leber, MD Medical Director of Neuro-Oncology [facility] 10/21/22 9:35 AM

## 2022-11-02 ENCOUNTER — Telehealth: Payer: Self-pay

## 2022-11-02 ENCOUNTER — Encounter: Payer: Self-pay | Admitting: Hematology and Oncology

## 2022-11-02 NOTE — Telephone Encounter (Signed)
S/w pt via phone regarding concerns for neck swelling. Per Jae Dire, [place] pt should follow up with [facility] MD to further assess swelling and if it is attributed to decadron taper. She verbalized understanding and states she will call them.

## 2022-11-09 ENCOUNTER — Ambulatory Visit: Payer: Commercial Managed Care - PPO | Admitting: Radiation Oncology

## 2022-11-10 ENCOUNTER — Telehealth: Payer: Self-pay | Admitting: Radiation Oncology

## 2022-11-10 NOTE — Telephone Encounter (Signed)
8/14 @ 8:59 am reach out to patient to get her reschedule for FU20 due to working on no shows/cancellations appointments.  Patient stated do not need to reschedule had already completed her treatments with [facility] and following up with them for her appointments at this time.

## 2022-11-24 ENCOUNTER — Encounter: Payer: Self-pay | Admitting: Hematology and Oncology

## 2022-12-01 ENCOUNTER — Encounter: Payer: Self-pay | Admitting: Hematology and Oncology

## 2022-12-02 ENCOUNTER — Other Ambulatory Visit: Payer: Self-pay | Admitting: Hematology and Oncology

## 2022-12-02 ENCOUNTER — Encounter: Payer: Self-pay | Admitting: Hematology and Oncology

## 2022-12-02 DIAGNOSIS — C50411 Malignant neoplasm of upper-outer quadrant of right female breast: Secondary | ICD-10-CM

## 2022-12-02 NOTE — Progress Notes (Signed)
DISCONTINUE ON PATHWAY REGIMEN - Breast     Cycle 1: A cycle is 21 days:     Pertuzumab      Trastuzumab-xxxx      Docetaxel      Carboplatin    Cycles 2 through 6: A cycle is every 21 days:     Pertuzumab      Trastuzumab-xxxx      Docetaxel      Carboplatin   **Always confirm dose/schedule in your pharmacy ordering system**  REASON: Disease Progression PRIOR TREATMENT: [id number]: Docetaxel + Carboplatin + Trastuzumab IV + Pertuzumab IV (TCHP IV) q21 Days x 6 Cycles TREATMENT RESPONSE: Unable to Evaluate  START OFF PATHWAY REGIMEN - Breast   [id number]:Pertuzumab 840/420 mg IV D1 + Trastuzumab 8/6 mg/kg IV D1 q21 Days:   A cycle is every 21 days:     Pertuzumab      Pertuzumab      Trastuzumab-xxxx      Trastuzumab-xxxx   **Always confirm dose/schedule in your pharmacy ordering system**  Patient Characteristics: Distant Metastases or Locoregional Recurrent Disease - Unresected, M0 or Locally Advanced Unresectable Disease Progressing after Neoadjuvant and Local Therapies, M0, HER2 Positive, ER Negative, Chemotherapy, First Line Therapeutic Status: Distant Metastases HER2 Status: Positive (+) ER Status: Negative (-) PR Status: Negative (-) Line of Therapy: First Line Intent of Therapy: Non-Curative / Palliative Intent, Discussed with Patient

## 2022-12-02 NOTE — Progress Notes (Signed)
Discussed with the patient about the treatment plan. She met with [facility] and is going to start an 8-week protocol that includes numerous medications along with vitamin C infusions.  Signatera testing is also been obtained. She would like [place] to place a port in anticipation of requiring vitamin C infusions as well as further Herceptin and Perjeta infusions.  She would also be treated with Herceptin Perjeta Tucatinib over the next few weeks we will know the start date of her treatment.  I requested Dr. Magnus Ivan to place a port.

## 2022-12-03 ENCOUNTER — Other Ambulatory Visit: Payer: Self-pay

## 2022-12-03 ENCOUNTER — Telehealth: Payer: Self-pay

## 2022-12-03 DIAGNOSIS — C7931 Secondary malignant neoplasm of brain: Secondary | ICD-10-CM

## 2022-12-03 DIAGNOSIS — C50411 Malignant neoplasm of upper-outer quadrant of right female breast: Secondary | ICD-10-CM

## 2022-12-03 NOTE — Progress Notes (Signed)
Orders entered for [facility] placement per MD. Pt is going to find out from her holistic Dr if they are able to access [facility] for the vitamin C infusions she is interested in. She will let us know.

## 2022-12-03 NOTE — Telephone Encounter (Signed)
-----   Message from Tamsen Meek sent at 12/03/2022  8:33 AM EDT ----- Regarding: FW: Darnelle Bos, Please get IR for port. Thanks Vinay ----- Message ----- From: Abigail Miyamoto, MD Sent: 12/02/2022   8:13 AM EDT To: Serena Croissant, MD Subject: RE: Port                                       Unfortunately IR will need to do it.  I am going to be out of town all next week and the week after my OR time is completely booked.  Doug ----- Message ----- From: Serena Croissant, MD Sent: 12/02/2022   7:59 AM EDT To: Abigail Miyamoto, MD Subject: Ellin Goodie, Can you place a port back again in the next 2 weeks? She has metastatic breast cancer and needs to start treatments Thanks Vinay

## 2022-12-03 NOTE — Telephone Encounter (Signed)
Order entered for [facility] placement per MD. See order note.

## 2022-12-05 ENCOUNTER — Encounter: Payer: Self-pay | Admitting: Hematology and Oncology

## 2022-12-06 ENCOUNTER — Ambulatory Visit: Payer: Commercial Managed Care - PPO | Attending: Surgery

## 2022-12-06 VITALS — Wt 163.0 lb

## 2022-12-06 DIAGNOSIS — Z483 Aftercare following surgery for neoplasm: Secondary | ICD-10-CM | POA: Insufficient documentation

## 2022-12-06 NOTE — Therapy (Signed)
OUTPATIENT PHYSICAL THERAPY SOZO SCREENING NOTE   Patient Name: Tara White MRN: [id number] DOB:[birth year], 32 y.o., female Today's Date: 12/06/2022  PCP: Samuella Bruin REFERRING PROVIDER: Abigail Miyamoto, MD   PT End of Session - 12/06/22 1102     Visit Number 3   # unchanged due to screen only   PT Start Time 1100    PT Stop Time 1104    PT Time Calculation (min) 4 min    Activity Tolerance Patient tolerated treatment well    Behavior During Therapy [facility] for tasks assessed/performed             Past Medical History:  Diagnosis Date   DVT (deep venous thrombosis) (HCC) 06/25/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   Family history of breast cancer 04/21/2021   right breast ca 03/2021   Past Surgical History:  Procedure Laterality Date   BREAST ENHANCEMENT SURGERY Left 06/25/2022   Procedure: MAMMOPLASTY AUGMENTATION, MASTOPEXY (BREAST);  Surgeon: Glenna Fellows, MD;  [facility]: [facility];  Service: Plastics;  Laterality: Left;   BREAST RECONSTRUCTION WITH PLACEMENT OF TISSUE EXPANDER AND ALLODERM Right 10/13/2021   Procedure: RIGHT BREAST RECONSTRUCTION WITH PLACEMENT OF TISSUE EXPANDER AND ALLODERM;  Surgeon: Glenna Fellows, MD;  Location: [facility];  Service: Plastics;  Laterality: Right;   CESAREAN SECTION N/A 12/10/2015   Procedure: CESAREAN SECTION;  Surgeon: Noland Fordyce, MD;  Location: [facility];  Service: Obstetrics;  Laterality: N/A;   IR CV LINE INJECTION  06/25/2021   IR RADIOLOGY PERIPHERAL GUIDED IV START  06/25/2021   IR US GUIDE VASC ACCESS LEFT  06/25/2021   IR VENO/EXT/UNI LEFT  06/25/2021   NODE DISSECTION Right 10/13/2021   Procedure: TARGETED LYMPH NODE DISSECTION;  Surgeon: Abigail Miyamoto, MD;  Location: [facility];  Service: General;  Laterality: Right;   PORT-A-CATH REMOVAL Left 05/27/2022   Procedure: REMOVAL PORT-A-CATH;  Surgeon: Abigail Miyamoto, MD;  Location: [facility];  Service:  General;  Laterality: Left;   PORTACATH PLACEMENT Left 04/30/2021   Procedure: INSERTION PORT-A-CATH;  Surgeon: Abigail Miyamoto, MD;  Location: [facility];  Service: General;  Laterality: Left;   REMOVAL OF TISSUE EXPANDER AND PLACEMENT OF IMPLANT Right 06/25/2022   Procedure: REMOVAL OF TISSUE EXPANDER AND PLACEMENT OF IMPLANT;  Surgeon: Glenna Fellows, MD;  Location: [facility];  Service: Plastics;  Laterality: Right;   SIMPLE MASTECTOMY WITH AXILLARY SENTINEL NODE BIOPSY Right 10/13/2021   Procedure: RIGHT MASTECTOMY;  Surgeon: Abigail Miyamoto, MD;  Location: [facility];  Service: General;  Laterality: Right;   WISDOM TOOTH EXTRACTION     Patient Active Problem List   Diagnosis Date Noted   Brain mass 09/15/2022   Metastasis to brain (HCC) 09/15/2022   Breast cancer, right (HCC) 10/13/2021   Port-A-Cath in place 07/17/2021   Family history of breast cancer 04/21/2021   Genetic testing 04/21/2021   Malignant neoplasm of upper-outer quadrant of right breast in female, estrogen receptor negative (HCC) 04/14/2021    REFERRING DIAG: right breast cancer at risk for lymphedema  THERAPY DIAG: Aftercare following surgery for neoplasm  PERTINENT HISTORY: Patient was diagnosed on 04/08/2021 with right grade III invasive ductal carcinoma breast cancer. It measures 3 cm with 10.8 cm of calcifications and is located in the upper outer quadrant. It is ER/PR negative and HER2 positive with a Ki67 of 30%. She has a biopsied positive axillary lymph node. 10/13/21- R mastectomy and SLNB (0/5)with reconstruction and expander placed  PRECAUTIONS: right UE Lymphedema risk, None  SUBJECTIVE: Pt returns for her 3 month L-Dex screen.   PAIN:  Are you having pain? No  SOZO SCREENING: Patient was assessed today using the SOZO machine to determine the lymphedema index score. This was compared to her baseline score. It was determined that she is within the recommended range when  compared to her baseline and no further action is needed at this time. She will continue SOZO screenings. These are done every 3 months for 2 years post operatively followed by every 6 months for 2 years, and then annually.   L-DEX FLOWSHEETS - 12/06/22 1100       L-DEX LYMPHEDEMA SCREENING   Measurement Type Unilateral    L-DEX MEASUREMENT EXTREMITY Upper Extremity    POSITION  Standing    DOMINANT SIDE Right    At Risk Side Right    BASELINE SCORE (UNILATERAL) -7.4    L-DEX SCORE (UNILATERAL) -4.3    VALUE CHANGE (UNILAT) 3.1               Hermenia Bers, PTA 12/06/2022, 11:03 AM

## 2022-12-08 ENCOUNTER — Other Ambulatory Visit: Payer: Self-pay

## 2022-12-14 ENCOUNTER — Encounter: Payer: Self-pay | Admitting: Hematology and Oncology

## 2022-12-15 ENCOUNTER — Other Ambulatory Visit: Payer: Self-pay

## 2022-12-17 ENCOUNTER — Other Ambulatory Visit: Payer: Self-pay | Admitting: Student

## 2022-12-17 DIAGNOSIS — Z171 Estrogen receptor negative status [ER-]: Secondary | ICD-10-CM

## 2022-12-18 ENCOUNTER — Encounter: Payer: Self-pay | Admitting: Hematology and Oncology

## 2022-12-20 ENCOUNTER — Ambulatory Visit (HOSPITAL_COMMUNITY): Payer: Commercial Managed Care - PPO

## 2022-12-20 ENCOUNTER — Encounter (HOSPITAL_COMMUNITY): Payer: Self-pay

## 2022-12-20 ENCOUNTER — Other Ambulatory Visit (HOSPITAL_COMMUNITY): Payer: Commercial Managed Care - PPO

## 2022-12-20 ENCOUNTER — Other Ambulatory Visit: Payer: Self-pay

## 2022-12-20 ENCOUNTER — Ambulatory Visit (HOSPITAL_COMMUNITY)
Admission: RE | Admit: 2022-12-20 | Discharge: 2022-12-20 | Disposition: A | Discharge status: Home / Self Care | Payer: Commercial Managed Care - PPO | Source: Ambulatory Visit | Attending: Hematology and Oncology | Admitting: Hematology and Oncology

## 2022-12-20 DIAGNOSIS — C7931 Secondary malignant neoplasm of brain: Secondary | ICD-10-CM | POA: Diagnosis not present

## 2022-12-20 DIAGNOSIS — Z86718 Personal history of other venous thrombosis and embolism: Secondary | ICD-10-CM | POA: Diagnosis not present

## 2022-12-20 DIAGNOSIS — Z9011 Acquired absence of right breast and nipple: Secondary | ICD-10-CM | POA: Diagnosis not present

## 2022-12-20 DIAGNOSIS — Z808 Family history of malignant neoplasm of other organs or systems: Secondary | ICD-10-CM | POA: Diagnosis not present

## 2022-12-20 DIAGNOSIS — Z803 Family history of malignant neoplasm of breast: Secondary | ICD-10-CM | POA: Insufficient documentation

## 2022-12-20 DIAGNOSIS — Z171 Estrogen receptor negative status [ER-]: Secondary | ICD-10-CM | POA: Insufficient documentation

## 2022-12-20 DIAGNOSIS — C50411 Malignant neoplasm of upper-outer quadrant of right female breast: Secondary | ICD-10-CM | POA: Diagnosis present

## 2022-12-20 HISTORY — PX: IR IMAGING GUIDED PORT INSERTION: IMG5740

## 2022-12-20 LAB — PREGNANCY, URINE: Preg Test, Ur: NEGATIVE

## 2022-12-20 MED ORDER — FENTANYL CITRATE (PF) 100 MCG/2ML IJ SOLN
INTRAMUSCULAR | Status: AC
  Filled 2022-12-20: qty 2

## 2022-12-20 MED ORDER — DIPHENHYDRAMINE HCL 50 MG/ML IJ SOLN
INTRAMUSCULAR | Status: AC
  Filled 2022-12-20: qty 1

## 2022-12-20 MED ORDER — LIDOCAINE-EPINEPHRINE 1 %-1:100000 IJ SOLN
20.0000 mL | Freq: Once | INTRAMUSCULAR | Status: AC
  Administered 2022-12-20: 20 mL via INTRADERMAL

## 2022-12-20 MED ORDER — MIDAZOLAM HCL 2 MG/2ML IJ SOLN
INTRAMUSCULAR | Status: AC
  Filled 2022-12-20: qty 2

## 2022-12-20 MED ORDER — FENTANYL CITRATE (PF) 100 MCG/2ML IJ SOLN
INTRAMUSCULAR | Status: AC | PRN
Start: 2022-12-20 — End: 2022-12-20
  Administered 2022-12-20 (×3): 50 ug via INTRAVENOUS

## 2022-12-20 MED ORDER — LIDOCAINE-EPINEPHRINE 1 %-1:100000 IJ SOLN
INTRAMUSCULAR | Status: AC
  Filled 2022-12-20: qty 1

## 2022-12-20 MED ORDER — SODIUM CHLORIDE 0.9 % IV SOLN: INTRAVENOUS | Status: DC

## 2022-12-20 MED ORDER — HEPARIN SOD (PORK) LOCK FLUSH 100 UNIT/ML IV SOLN
INTRAVENOUS | Status: AC
  Filled 2022-12-20: qty 5

## 2022-12-20 MED ORDER — MIDAZOLAM HCL 2 MG/2ML IJ SOLN
INTRAMUSCULAR | Status: AC | PRN
Start: 2022-12-20 — End: 2022-12-20
  Administered 2022-12-20 (×3): 1 mg via INTRAVENOUS

## 2022-12-20 NOTE — Procedures (Signed)
Pre Procedure Dx: Breast cancer Post Procedural Dx: Same  Successful placement of left IJ approach port-a-cath with tip at the superior caval atrial junction. The catheter is ready for immediate use.  Estimated Blood Loss: Trace  Complications: None immediate.  Katherina Right, MD Pager #: [phone]

## 2022-12-20 NOTE — H&P (Signed)
Chief Complaint: Patient was seen in consultation today for right breast cancer  Referring Physician(s): Gudena,Vinay  Supervising Physician: Simonne Come  Patient Status: [facility] - Out-pt  History of Present Illness: Tara White is a 32 y.o. female with patient medical history of DVT, breast cancer who completed therapy at the end of 2023.  She maintained remission until June of this year when she was admitted with diplopia.  She was found to have metastatic brain lesion concerning for recurrent of her breast cancer.  She now plans for chemoradiation with integrative therapy.  She had a Port placed by surgery on the left which was removed at the initial completion of her therapy.  IR consulted for replacement.   Patient presents to IR for procedure today.  She has been NPO.  She does have a ride home with her husband who will be providing post-procedure care at home.  She is understanding of the goals of the procedure and is agreeable to proceed.   Past Medical History:  Diagnosis Date   DVT (deep venous thrombosis) (HCC) 06/25/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   Family history of breast cancer 04/21/2021   right breast ca 03/2021    Past Surgical History:  Procedure Laterality Date   BREAST ENHANCEMENT SURGERY Left 06/25/2022   Procedure: MAMMOPLASTY AUGMENTATION, MASTOPEXY (BREAST);  Surgeon: Glenna Fellows, MD;  [facility]: [facility];  Service: Plastics;  Laterality: Left;   BREAST RECONSTRUCTION WITH PLACEMENT OF TISSUE EXPANDER AND ALLODERM Right 10/13/2021   Procedure: RIGHT BREAST RECONSTRUCTION WITH PLACEMENT OF TISSUE EXPANDER AND ALLODERM;  Surgeon: Glenna Fellows, MD;  Location: [facility];  Service: Plastics;  Laterality: Right;   CESAREAN SECTION N/A 12/10/2015   Procedure: CESAREAN SECTION;  Surgeon: Noland Fordyce, MD;  Location: [facility];  Service: Obstetrics;  Laterality: N/A;   IR CV LINE INJECTION  06/25/2021   IR RADIOLOGY PERIPHERAL GUIDED IV  START  06/25/2021   IR US GUIDE VASC ACCESS LEFT  06/25/2021   IR VENO/EXT/UNI LEFT  06/25/2021   NODE DISSECTION Right 10/13/2021   Procedure: TARGETED LYMPH NODE DISSECTION;  Surgeon: Abigail Miyamoto, MD;  Location: [facility];  Service: General;  Laterality: Right;   PORT-A-CATH REMOVAL Left 05/27/2022   Procedure: REMOVAL PORT-A-CATH;  Surgeon: Abigail Miyamoto, MD;  Location: [facility];  Service: General;  Laterality: Left;   PORTACATH PLACEMENT Left 04/30/2021   Procedure: INSERTION PORT-A-CATH;  Surgeon: Abigail Miyamoto, MD;  Location: [facility];  Service: General;  Laterality: Left;   REMOVAL OF TISSUE EXPANDER AND PLACEMENT OF IMPLANT Right 06/25/2022   Procedure: REMOVAL OF TISSUE EXPANDER AND PLACEMENT OF IMPLANT;  Surgeon: Glenna Fellows, MD;  Location: [facility];  Service: Plastics;  Laterality: Right;   SIMPLE MASTECTOMY WITH AXILLARY SENTINEL NODE BIOPSY Right 10/13/2021   Procedure: RIGHT MASTECTOMY;  Surgeon: Abigail Miyamoto, MD;  Location: [facility];  Service: General;  Laterality: Right;   WISDOM TOOTH EXTRACTION      Allergies: Patient has no known allergies.  Medications: Prior to Admission medications   Medication Sig Start Date End Date Taking? Authorizing Provider  ascorbic acid (VITAMIN C) 1000 MG tablet Take 1,000 mg by mouth daily.   Yes [provider]  carboxymethylcellulose (REFRESH TEARS) 0.5 % SOLN Place 1 drop into both eyes 3 (three) times daily as needed (Dry eye).   Yes [provider]  Cholecalciferol (VITAMIN D) 125 MCG (5000 UT) CAPS Take 5,000 Int'l Units/L by  mouth daily.   Yes [provider]  dexamethasone (DECADRON) 4 MG tablet Take 1 tablet (4 mg total) by mouth 2 (two) times daily with a meal. Patient taking differently: Take 4 mg by mouth daily. Taking total of 6 mg daily 09/21/22  Yes Erven Colla, PA-C  magnesium oxide (MAG-OX) 400 MG tablet Take 400 mg by  mouth daily.   Yes [provider]  Melatonin 10 MG CAPS Take 10 mg by mouth at bedtime.   Yes [provider]  omeprazole (PRILOSEC) 20 MG capsule Take 20 mg by mouth daily.   Yes [provider]  Cholecalciferol (VITAMIN D-3) 5000 UNIT/ML LIQD Take by mouth.    [provider]  diphenhydrAMINE (BENADRYL) 25 mg capsule Take 25 mg by mouth at bedtime as needed.    [provider]  Vitamin D, Ergocalciferol, (DRISDOL) 1.25 MG (50000 UNIT) CAPS capsule Take 50,000 Units by mouth every 7 (seven) days.    [provider]     Family History  Problem Relation Age of Onset   Hypothyroidism Mother    Breast cancer Maternal Grandmother 25   Skin cancer Maternal Grandfather    Heart failure Paternal Grandfather     Social History   Socioeconomic History   Marital status: Married    Spouse name: Not on file   Number of children: Not on file   Years of education: Not on file   Highest education level: Not on file  Occupational History   Not on file  Tobacco Use   Smoking status: Never   Smokeless tobacco: Never  Vaping Use   Vaping status: Not on file  Substance and Sexual Activity   Alcohol use: Yes    Alcohol/week: 2.0 standard drinks of alcohol    Types: 2 Standard drinks or equivalent per week    Comment: rarely   Drug use: No   Sexual activity: Not Currently    Partners: Male    Birth control/protection: Other-see comments    Comment: cycles have not resumed since receiving chemotherapy  Other Topics Concern   Not on file  Social History Narrative   Not on file   Social Determinants of Health   Financial Resource Strain: Low Risk  (11/01/2022)   Received from [facility]   Overall Financial Resource Strain (CARDIA)    Difficulty of Paying Living Expenses: Not hard at all  Food Insecurity: No Food Insecurity (11/01/2022)   Received from [facility]   Hunger Vital Sign    Worried About Running Out of Food in the Last  Year: Never true    Ran Out of Food in the Last Year: Never true  Transportation Needs: No Transportation Needs (11/01/2022)   Received from [facility] - Transportation    Lack of Transportation (Medical): No    Lack of Transportation (Non-Medical): No  Physical Activity: Not on file  Stress: Not on file  Social Connections: Unknown (07/27/2021)   Received from [facility]   Social Network    Social Network: Not on file     Review of Systems: A 12 point ROS discussed and pertinent positives are indicated in the HPI above.  All other systems are negative.  Review of Systems  Constitutional:  Negative for fatigue and fever.  Respiratory:  Negative for cough and shortness of breath.   Cardiovascular:  Negative for chest pain.  Gastrointestinal:  Negative for abdominal pain, nausea and vomiting.  Musculoskeletal:  Negative  for back pain.  Psychiatric/Behavioral:  Negative for behavioral problems and confusion.     Vital Signs: BP 122/72   Pulse 63   Temp 98.4 F (36.9 C) (Oral)   Resp 18   Ht 5\' 9"  (1.753 m)   Wt 155 lb (70.3 kg)   SpO2 100%   BMI 22.89 kg/m   Physical Exam Constitutional:      General: She is not in acute distress.    Appearance: Normal appearance. She is not ill-appearing.  HENT:     Mouth/Throat:     Mouth: Mucous membranes are moist.     Pharynx: Oropharynx is clear.  Cardiovascular:     Rate and Rhythm: Normal rate and regular rhythm.  Pulmonary:     Effort: Pulmonary effort is normal.     Breath sounds: Normal breath sounds.  Abdominal:     General: Abdomen is flat.     Palpations: Abdomen is soft.  Skin:    General: Skin is warm and dry.  Neurological:     General: No focal deficit present.     Mental Status: She is alert and oriented to person, place, and time. Mental status is at baseline.  Psychiatric:        Mood and Affect: Mood normal.        Behavior: Behavior normal.        Thought Content: Thought  content normal.        Judgment: Judgment normal.      MD Evaluation Airway: WNL Heart: WNL Abdomen: WNL Chest/ Lungs: WNL ASA  Classification: 2 Mallampati/Airway Score: Two   Imaging: No results found.  Labs:  CBC: Recent Labs    03/08/22 1126 04/21/22 1103 06/25/22 0605 09/15/22 1010 09/15/22 1025  WBC 6.6 5.9 5.4 6.5  --   HGB 12.6 12.0 12.8 14.2 14.3  HCT 35.6* 34.7* 38.7 41.6 42.0  PLT 180 213 189 212  --     COAGS: No results for input(s): "INR", "APTT" in the last 8760 hours.  BMP: Recent Labs    01/28/22 1036 03/08/22 1126 04/21/22 1103 09/15/22 1025 09/20/22 1320  NA 138 137 137 139  --   K 4.0 4.0 4.1 4.3  --   CL 106 106 105 106  --   CO2 27 24 27   --   --   GLUCOSE 80 89 78 91  --   BUN 20 17 22* 18 21*  CALCIUM 9.1 9.1 9.1  --   --   CREATININE 1.08* 0.83 0.95 1.00 1.02*  GFRNONAA >60 >60 >60  --  >60    LIVER FUNCTION TESTS: Recent Labs    01/28/22 1036 03/08/22 1126 04/21/22 1103  BILITOT 0.6 0.6 0.5  AST 21 16 19   ALT 19 15 16   ALKPHOS 82 94 80  PROT 7.3 6.9 7.1  ALBUMIN 4.3 4.2 4.0    TUMOR MARKERS: No results for input(s): "AFPTM", "CEA", "CA199", "CHROMGRNA" in the last 8760 hours.  Assessment and Plan: Patient with past medical history of breast cancer presents with complaint of disease recurrence, new brain lesions with plans for upcoming chemoradiation.  IR consulted for Port-A-Cath placement at the request of Dr. Pamelia Hoit. Case reviewed by Dr. Grace Isaac who approves patient for procedure.  Patient presents today in their usual state of health.  She has been NPO and is not currently on blood thinners.   Risks and benefits of image guided port-a-catheter placement was discussed with the patient including, but not limited to  bleeding, infection, pneumothorax, or fibrin sheath development and need for additional procedures.  All of the patient's questions were answered, patient is agreeable to proceed. Consent signed and  in chart.   Thank you for this interesting consult.  I greatly enjoyed meeting Tara White and look forward to participating in their care.  A copy of this report was sent to the requesting provider on this date.  Electronically Signed: Hoyt Koch, PA 12/20/2022, 2:11 PM   I spent a total of  30 Minutes   in face to face in clinical consultation, greater than 50% of which was counseling/coordinating care for breast cancer.

## 2022-12-20 NOTE — Sedation Documentation (Signed)
Pt complains of pain at PIV site.  Swelling noted to site.  Clamped IV fluids.  Fentanyl and versed given by Dr. Grace Isaac via Port-a-cath for pain and sedation for procedure.

## 2022-12-22 ENCOUNTER — Telehealth: Payer: Self-pay | Admitting: Hematology and Oncology

## 2022-12-22 NOTE — Telephone Encounter (Signed)
Patient called [place] with concerns of neck and back pain that have been going on for a while at times it can be 8 out of 10.  She has been taking Tylenol.  However since yesterday the pain has gotten better significantly. I discussed with her about obtaining a CT scan of the neck and chest abdomen pelvis but at this time since her pain had improved we decided to hold off on it.  I instructed her to call us back if the symptoms start to get any worse so we can obtain scans.

## 2022-12-23 ENCOUNTER — Encounter: Payer: Self-pay | Admitting: Internal Medicine

## 2022-12-28 ENCOUNTER — Encounter: Payer: Self-pay | Admitting: Hematology and Oncology

## 2022-12-28 ENCOUNTER — Other Ambulatory Visit: Payer: Self-pay | Admitting: *Deleted

## 2022-12-28 DIAGNOSIS — Z171 Estrogen receptor negative status [ER-]: Secondary | ICD-10-CM

## 2022-12-28 NOTE — Progress Notes (Signed)
Received mychart message from pt with complaint of intense and frequent pain radiating from her lower back down her legs.  Verbal orders received from MD to obtain MRI lumbar spine and Sacrum.  Orders placed, pt notified and verbalized understanding.

## 2022-12-29 ENCOUNTER — Other Ambulatory Visit: Payer: Self-pay | Admitting: *Deleted

## 2022-12-29 DIAGNOSIS — C7931 Secondary malignant neoplasm of brain: Secondary | ICD-10-CM

## 2022-12-29 NOTE — Progress Notes (Signed)
Received call from pt with complaint of new visual changes.  Per MD, STAT brain MRI needing to be obtained.  Orders placed, appt scheduled, pt notified.

## 2022-12-30 ENCOUNTER — Other Ambulatory Visit: Payer: Self-pay | Admitting: Hematology and Oncology

## 2022-12-30 ENCOUNTER — Ambulatory Visit (HOSPITAL_COMMUNITY)
Admission: RE | Admit: 2022-12-30 | Discharge: 2022-12-30 | Disposition: A | Discharge status: Home / Self Care | Payer: Commercial Managed Care - PPO | Source: Ambulatory Visit | Attending: Hematology and Oncology | Admitting: Hematology and Oncology

## 2022-12-30 DIAGNOSIS — Z171 Estrogen receptor negative status [ER-]: Secondary | ICD-10-CM

## 2022-12-30 DIAGNOSIS — C7931 Secondary malignant neoplasm of brain: Secondary | ICD-10-CM | POA: Insufficient documentation

## 2022-12-30 MED ORDER — GADOBUTROL 1 MMOL/ML IV SOLN
7.0000 mL | Freq: Once | INTRAVENOUS | Status: AC | PRN
  Administered 2022-12-30: 7 mL via INTRAVENOUS

## 2022-12-30 MED ORDER — HEPARIN SOD (PORK) LOCK FLUSH 100 UNIT/ML IV SOLN
500.0000 [IU] | Freq: Once | INTRAVENOUS | Status: AC
  Administered 2022-12-30: 500 [IU] via INTRAVENOUS

## 2022-12-31 ENCOUNTER — Telehealth: Payer: Self-pay | Admitting: Hematology and Oncology

## 2022-12-31 ENCOUNTER — Other Ambulatory Visit: Payer: Self-pay | Admitting: Hematology and Oncology

## 2022-12-31 ENCOUNTER — Other Ambulatory Visit: Payer: Self-pay | Admitting: Radiation Therapy

## 2022-12-31 ENCOUNTER — Telehealth: Payer: Self-pay | Admitting: Radiation Therapy

## 2022-12-31 MED ORDER — OXYCODONE-ACETAMINOPHEN 5-325 MG PO TABS
1.0000 | ORAL_TABLET | Freq: Three times a day (TID) | ORAL | 0 refills | Status: DC | PRN
Start: 2022-12-31 — End: 2023-04-06

## 2022-12-31 NOTE — Progress Notes (Signed)
Histology and Location of Primary Cancer:     Location(s) of Symptomatic tumor(s):  Pt to have further imaging on 01-04-23 MR Brain W Wo Contrast 12-31-22 IMPRESSION: Since 09/15/2022:   1. Decreased size of the enhancing metastatic lesion centered in the left caudate head with improved surrounding edema and essentially resolved rightward midline shift. There is persistent enhancement along the ependymal lining of the left frontal horn. 2. 4 mm enhancing lesion in the cervical cord at the C2 level is new from the prior study. Recommend dedicated imaging of the total spine with and without contrast to evaluate for additional intramedullary metastatic lesions.  Past/Anticipated chemotherapy by medical oncology, if any:  Serena Croissant, MD 10/21/2022  Oncology History  Malignant neoplasm of upper-outer quadrant of right breast in female, estrogen receptor negative (HCC)  04/08/2021 Initial Diagnosis    History of right breast eczematous change to the nipple, patient had palpable lump in the right breast her mammogram 2.1 cm spiculated mass with extensive calcifications that span 11 cm.  Ultrasound revealed 3 cm mass at 10:00 plus small satellite lesions.  Biopsy revealed grade 3 IDC ER 0%, PR 0%, HER2 3+ positive, Ki-67 30%    04/08/2021 Cancer Staging    Staging form: Breast, AJCC 8th Edition - Clinical stage from 04/08/2021: Stage IIB (cT2, cN1, cM0, G3, ER-, PR-, HER2+) - Signed by Serena Croissant, MD on 04/15/2021 Stage prefix: Initial diagnosis Method of lymph node assessment: Axillary lymph node dissection Histologic grading system: 3 grade system    05/01/2021 - 11/26/2021 Chemotherapy    Patient is on Treatment Plan : BREAST  Docetaxel + Carboplatin + Trastuzumab + Pertuzumab  (TCHP) q21d      05/21/2021 Genetic Testing    Negative hereditary cancer genetic testing: no pathogenic variants detected in [facility] CustomNext-Cancer +RNAinsight Panel.  Report date is 05/21/2021.    The  CustomNext-Cancer+RNAinsight panel offered by [employer] includes sequencing and rearrangement analysis for the following 47 genes:  APC, ATM, AXIN2, BARD1, BMPR1A, BRCA1, BRCA2, BRIP1, CDH1, CDK4, CDKN2A, CHEK2, DICER1, EPCAM, GREM1, HOXB13, MEN1, MLH1, MSH2, MSH3, MSH6, MUTYH, NBN, NF1, NF2, NTHL1, PALB2, PMS2, POLD1, POLE, PTEN, RAD51C, RAD51D, RECQL, RET, SDHA, SDHAF2, SDHB, SDHC, SDHD, SMAD4, SMARCA4, STK11, TP53, TSC1, TSC2, and VHL.  RNA data is routinely analyzed for use in variant interpretation for all genes.    09/04/2021 - 04/21/2022 Chemotherapy    Patient is on Treatment Plan : BREAST Trastuzumab  + Pertuzumab q21d x 13 cycles     10/13/2021 Surgery    right mastectomy with reconstruction: No residual malignancy complete pathologic response 0/5 lymph nodes negative     12/03/2021 - 01/11/2022 Radiation Therapy    Site Technique Total Dose (Gy) Dose per Fx (Gy) Completed Fx Beam Energies  Chest Wall, Right: CW_R_IMN 3D 50.4/50.4 1.8 28/28 6XFFF  Chest Wall, Right: CW_R_PAB_SCV 3D 50.4/50.4 1.8 28/28 6X, 10X        ASSESSMENT & PLAN:  Malignant neoplasm of upper-outer quadrant of right breast in female, estrogen receptor negative (HCC) Hospitalization: 09/15/2022-09/16/2022: Diplopia: Newly diagnosed brain metastases: 3.74 cm left caudate lobe lesion, not a surgical candidate.   (04/08/2021: ER 0%, PR 0%, HER2 3+ positive right breast cancer status post neoadjuvant chemotherapy with TCH Perjeta: Pathologic complete response, adjuvant radiation, Herceptin and Perjeta maintenance completed 04/21/2021)   Treatment plan: 10/11/2022-10/15/2022: SRS to the brain Systemic therapy needs to be decided. Patient met with Dr. Caswell Corwin and discussed participating in the [facility] clinical trial.  She is very concerned  about the travel that will be required to be part of the trial and asked [place] if we could do similar treatment in [place] without participating in the trial.  I discussed with her that it  is a possibility however we may have to consider adding Xeloda to her treatment regimen to get Tucatinib approved.  We can also consider Phesgo instead of Herceptin Perjeta.   She has been considering alternate/complementary treatment options including mushrooms and ivermectin etc.  There is no interactions with Tucatinib therefore she can use those at her own risk.   She will inform us of her decision and we can proceed currently. She was very appreciative of Dr. Dareen Piano and her counseling.  Serena Croissant, MD 12-31-22 I discussed the results of the brain MRI with the patient. She experiencing a lot of pain in the neck as well as the lower back. We are obtaining a cervical spine MRI as well as the lumbar and sacral MRI.   Patient has started on her 8-week regimen with her holistic doctors which includes vitamin C infusions and at least 20 or so different medications.   Plan: I will review the MRI with Dr. Basilio Cairo and Dr. Barbaraann Cao. I sent a prescription for Percocets for her pain. I would like her to discuss with Jonny Ruiz our pharmacist about interactions between Herceptin Perjeta and Tucatinib and her current regimen.  If there are no major interactions we would like to get her started on her systemic therapy in the next week or 2.  Patient's main complaints related to symptomatic tumor(s) are: seen on brain MRI   Pain on a scale of 0-10 is: ***   If Spine Met(s), symptoms, if any, include: Bowel/Bladder retention or incontinence (please describe): *** Numbness or weakness in extremities (please describe): *** Current Decadron regimen, if applicable: ***  Ambulatory status? Walker? Wheelchair?: ***  SAFETY ISSUES: Prior radiation? Yes,  Pacemaker/ICD? *** Possible current pregnancy? No, neg preg test on 12-20-22 Is the patient on methotrexate? no  Additional Complaints / other details:  ***

## 2022-12-31 NOTE — Telephone Encounter (Signed)
I discussed the results of the brain MRI with the patient. She experiencing a lot of pain in the neck as well as the lower back. We are obtaining a cervical spine MRI as well as the lumbar and sacral MRI.  Patient has started on her 8-week regimen with her holistic doctors which includes vitamin C infusions and at least 20 or so different medications.  Plan: I will review the MRI with Dr. Basilio Cairo and Dr. Barbaraann Cao. I sent a prescription for Percocets for her pain. I would like her to discuss with Jonny Ruiz our pharmacist about interactions between Herceptin Perjeta and Tucatinib and her current regimen.  If there are no major interactions we would like to get her started on her systemic therapy in the next week or 2.

## 2022-12-31 NOTE — Telephone Encounter (Signed)
I spoke with Tara White about her Re-consult and SIM scheduled Wed 10/9.   Jalene Mullet R.T.(R)(T) Radiation Special Procedure Navigator

## 2023-01-03 ENCOUNTER — Ambulatory Visit (HOSPITAL_COMMUNITY): Payer: Commercial Managed Care - PPO

## 2023-01-03 ENCOUNTER — Other Ambulatory Visit: Payer: Self-pay | Admitting: *Deleted

## 2023-01-03 ENCOUNTER — Encounter: Payer: Self-pay | Admitting: *Deleted

## 2023-01-03 ENCOUNTER — Inpatient Hospital Stay: Payer: Commercial Managed Care - PPO

## 2023-01-03 DIAGNOSIS — C7931 Secondary malignant neoplasm of brain: Secondary | ICD-10-CM | POA: Insufficient documentation

## 2023-01-03 DIAGNOSIS — R5383 Other fatigue: Secondary | ICD-10-CM | POA: Insufficient documentation

## 2023-01-03 DIAGNOSIS — Z5111 Encounter for antineoplastic chemotherapy: Secondary | ICD-10-CM | POA: Insufficient documentation

## 2023-01-03 DIAGNOSIS — R110 Nausea: Secondary | ICD-10-CM | POA: Insufficient documentation

## 2023-01-03 DIAGNOSIS — Z79899 Other long term (current) drug therapy: Secondary | ICD-10-CM | POA: Insufficient documentation

## 2023-01-03 DIAGNOSIS — C50411 Malignant neoplasm of upper-outer quadrant of right female breast: Secondary | ICD-10-CM | POA: Insufficient documentation

## 2023-01-03 DIAGNOSIS — R200 Anesthesia of skin: Secondary | ICD-10-CM

## 2023-01-03 DIAGNOSIS — M542 Cervicalgia: Secondary | ICD-10-CM

## 2023-01-03 DIAGNOSIS — G8929 Other chronic pain: Secondary | ICD-10-CM

## 2023-01-03 DIAGNOSIS — Z171 Estrogen receptor negative status [ER-]: Secondary | ICD-10-CM

## 2023-01-03 NOTE — Progress Notes (Signed)
Per MD request ,due to worsening symptoms including back pain, neck pain, and left foot numbness, order placed for STAT MRI total spine.

## 2023-01-03 NOTE — Progress Notes (Signed)
RN successfully faxed EKG results, office notes, and labs to Dr. Humberto Leep [phone]).

## 2023-01-04 ENCOUNTER — Ambulatory Visit (HOSPITAL_COMMUNITY): Payer: Commercial Managed Care - PPO

## 2023-01-04 ENCOUNTER — Ambulatory Visit (HOSPITAL_COMMUNITY)
Admission: RE | Admit: 2023-01-04 | Discharge: 2023-01-04 | Disposition: A | Discharge status: Home / Self Care | Payer: Commercial Managed Care - PPO | Source: Ambulatory Visit | Attending: Hematology and Oncology | Admitting: Hematology and Oncology

## 2023-01-04 DIAGNOSIS — C7931 Secondary malignant neoplasm of brain: Secondary | ICD-10-CM | POA: Diagnosis present

## 2023-01-04 DIAGNOSIS — C50411 Malignant neoplasm of upper-outer quadrant of right female breast: Secondary | ICD-10-CM | POA: Diagnosis present

## 2023-01-04 DIAGNOSIS — M545 Low back pain, unspecified: Secondary | ICD-10-CM | POA: Insufficient documentation

## 2023-01-04 DIAGNOSIS — G8929 Other chronic pain: Secondary | ICD-10-CM | POA: Diagnosis not present

## 2023-01-04 DIAGNOSIS — R200 Anesthesia of skin: Secondary | ICD-10-CM

## 2023-01-04 DIAGNOSIS — C7949 Secondary malignant neoplasm of other parts of nervous system: Secondary | ICD-10-CM | POA: Insufficient documentation

## 2023-01-04 DIAGNOSIS — Z171 Estrogen receptor negative status [ER-]: Secondary | ICD-10-CM

## 2023-01-04 DIAGNOSIS — M542 Cervicalgia: Secondary | ICD-10-CM

## 2023-01-04 MED ORDER — GADOBUTROL 1 MMOL/ML IV SOLN
7.0000 mL | Freq: Once | INTRAVENOUS | Status: AC | PRN
  Administered 2023-01-04: 7 mL via INTRAVENOUS

## 2023-01-04 MED ORDER — HEPARIN SOD (PORK) LOCK FLUSH 100 UNIT/ML IV SOLN
500.0000 [IU] | INTRAVENOUS | Status: AC | PRN
  Administered 2023-01-04: 500 [IU]

## 2023-01-04 NOTE — Progress Notes (Incomplete)
Radiation Oncology         [phone] ________________________________  Outpatient re Consultation  Name: Tara White MRN: [id number]  Date: 01/05/2023  DOB: 1990/05/05  WU:[id number], Yetta Glassman, PA-C  Kirby Crigler, Mir Judie Petit, MD   REFERRING PHYSICIAN: Kirby Crigler, Mir Judie Petit, MD  DIAGNOSIS:    ICD-10-CM   1. Metastasis to brain (HCC)  C79.31 temazepam (RESTORIL) 22.5 MG capsule    2. Malignant neoplasm of upper-outer quadrant of right breast in female, estrogen receptor negative (HCC)  C50.411    Z17.1     3. 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   Diagnosed with brain metastases in June 2024, s/p SRS.   Now with evidence of osseous metastatic disease to C2 in October 2024   Cancer Staging  Malignant neoplasm of upper-outer quadrant of right breast in female, estrogen receptor negative (HCC) Staging form: Breast, AJCC 8th Edition - Clinical stage from 04/08/2021: Stage IIB (cT2, cN1, cM0, G3, ER-, PR-, HER2+) - Signed by Serena Croissant, MD on 04/15/2021 Stage prefix: Initial diagnosis Method of lymph node assessment: Axillary lymph node dissection Histologic grading system: 3 grade system   CHIEF COMPLAINT: Here to discuss management of osseous metastatic disease to the cervical spine from a breast cancer primary  Patient provided consent for physician to use [employer] AI recording software to aid with documentation  Narrative / Interval History - 01/05/23::Tara White is a 32 y.o. female who presents today to discuss the role of radiation therapy in management of her recently diagnosed C2 metastasis from a metastatic breast cancer primary. She was last seen here for re-consultation on 09/21/22 to discuss SRS  to her brain metastases. She was initially planning on receiving treatment here but opted to receive SRS at [facility] under Dr. Almetta Lovely (completed on 10/15/2022). Since completing SRS, she has continued to follow up with Dr. Barbaraann Cao and Dr. Pamelia Hoit. Her history pertaining to her recently diagnosed spinal metastatic disease is detailed as follows.   This past late August, she began to develop swelling to the back of her neck. This was followed by an onset of progressive neck and back pain starting in September. She also began experiencing some visual changes earlier this month.   To further evaluate her symptoms, and for restaging purposes, she presented for an MRI of the brain on 12/30/22 which demonstrated a new 4 mm enhancing cervical cord lesion at the level of C2. MRI otherwise showed a decrease in size of the enhancing metastatic lesion centered in the left caudate head, improvement of the surrounding edema, and near complete resolution of the previously demonstrated rightward midline shift. However, an area of persistent enhancement was also demonstrated along the ependymal lining of the left frontal horn.  Based on recent discussions with Dr. Pamelia Hoit, she is interested in resuming Herceptin and Perjeta along with Tucatinib. In addition to this, she is interested in holistic treatment and is established at [facility] in [place]. She will hopefully be able to start systemic therapy in the next week or so. For her pain, Dr. Pamelia Hoit has prescribed her Percocet will little effect. Overall, she reports worsening pain in the neck and lower back for over a month. The pain also radiates down the legs, particularly  the left leg. The pain is described as severe, especially at night, and is not relieved by Percocet. The patient has been managing the pain with over-the-counter Tylenol and Advil, which provide some relief. She took 3 benadryl recently to help with insomnia   In addition to the  pain, the patient reports numbness in the left foot, which has been present for about two weeks. There is also subtle numbness in the upper left arm and back of the left leg. The patient denies any weakness in the legs, but reports difficulty bending over due to the pain.  The patient denies any changes in bowel or bladder function. There is no reported numbness in the genital region. The patient has been planning vitamin C infusions and is on an alternative thyroid supplement with [facility].    PREVIOUS RADIATION THERAPY: Yes   2) AT [facility] Diagnosis: brain metastases from a breast cancer primary  Intent: high dose treatment for lasting local control Site treated: left frontal met Treatment modality: fractionated stereotactic radiosurgery (fSRS)  Simulation type: CT/Sim Dose per fraction: 550 cGy Total Dose: 2750 cGy Days Elapsed: 4 Treatment Dates  From: 10/11/2022 To: 10/15/2022   1) AT [place] Diagnoses: C50.411-Malignant neoplasm of upper-outer quadrant of right female breast  Intent: Curative Radiation Treatment Dates: 12/03/2021 through 01/11/2022 Site Technique Total Dose (Gy) Dose per Fx (Gy) Completed Fx Beam Energies  Chest Wall, Right: CW_R_IMN 3D 50.4/50.4 1.8 28/28 6XFFF  Chest Wall, Right: CW_R_PAB_SCV 3D 50.4/50.4 1.8 28/28 6X, 10X    PAST MEDICAL HISTORY:  has a past medical history of DVT (deep venous thrombosis) (HCC) (06/25/2021), Family history of breast cancer (04/21/2021), and right breast ca (03/2021).    PAST SURGICAL HISTORY: Past Surgical History:  Procedure Laterality Date   BREAST ENHANCEMENT SURGERY Left 06/25/2022   Procedure: MAMMOPLASTY AUGMENTATION, MASTOPEXY (BREAST);  Surgeon: Glenna Fellows, MD;  Location: [facility];  Service: Plastics;  Laterality: Left;   BREAST RECONSTRUCTION WITH PLACEMENT OF TISSUE EXPANDER AND ALLODERM Right 10/13/2021   Procedure: RIGHT BREAST RECONSTRUCTION WITH PLACEMENT OF TISSUE EXPANDER  AND ALLODERM;  Surgeon: Glenna Fellows, MD;  Location: [facility];  Service: Plastics;  Laterality: Right;   CESAREAN SECTION N/A 12/10/2015   Procedure: CESAREAN SECTION;  Surgeon: Noland Fordyce, MD;  Location: [facility];  Service: Obstetrics;  Laterality: N/A;   IR CV LINE INJECTION  06/25/2021   IR IMAGING GUIDED PORT INSERTION  12/20/2022   IR RADIOLOGY PERIPHERAL GUIDED IV START  06/25/2021   IR US GUIDE VASC ACCESS LEFT  06/25/2021   IR VENO/EXT/UNI LEFT  06/25/2021   NODE DISSECTION Right 10/13/2021   Procedure: TARGETED LYMPH NODE DISSECTION;  Surgeon: Abigail Miyamoto, MD;  Location: [facility];  Service: General;  Laterality: Right;   PORT-A-CATH REMOVAL Left 05/27/2022   Procedure: REMOVAL PORT-A-CATH;  Surgeon: Abigail Miyamoto, MD;  Location: [facility];  Service: General;  Laterality: Left;   PORTACATH PLACEMENT Left 04/30/2021   Procedure: INSERTION PORT-A-CATH;  Surgeon: Abigail Miyamoto, MD;  Location: [facility];  Service: General;  Laterality: Left;   REMOVAL OF TISSUE EXPANDER AND PLACEMENT OF IMPLANT Right 06/25/2022   Procedure: REMOVAL OF TISSUE EXPANDER AND PLACEMENT OF IMPLANT;  Surgeon: Glenna Fellows, MD;  Location: [facility];  Service: Plastics;  Laterality: Right;   SIMPLE MASTECTOMY WITH AXILLARY SENTINEL NODE BIOPSY Right 10/13/2021   Procedure: RIGHT MASTECTOMY;  Surgeon: Abigail Miyamoto, MD;  Location:  SURGERY CENTER;  Service: General;  Laterality: Right;   WISDOM TOOTH EXTRACTION      FAMILY HISTORY: family history includes Breast cancer (age of onset: 69) in her maternal grandmother; Heart failure in her paternal grandfather; Hypothyroidism in her mother; Skin cancer in her maternal grandfather.  SOCIAL HISTORY:  reports that she has never smoked. She has never used smokeless tobacco. She reports current alcohol use of about 2.0 standard drinks of alcohol per week. She reports that  she does not use drugs.  ALLERGIES: Patient has no known allergies.  MEDICATIONS:  Current Outpatient Medications  Medication Sig Dispense Refill   ascorbic acid (VITAMIN C) 1000 MG tablet Take 1,000 mg by mouth daily.     carboxymethylcellulose (REFRESH TEARS) 0.5 % SOLN Place 1 drop into both eyes 3 (three) times daily as needed (Dry eye).     Cholecalciferol (VITAMIN D) 125 MCG (5000 UT) CAPS Take 5,000 Int'l Units/L by mouth daily.     cimetidine (TAGAMET) 400 MG tablet Take 400 mg by mouth 2 (two) times daily.     diphenhydrAMINE (BENADRYL) 25 mg capsule Take 25 mg by mouth at bedtime as needed.     dipyridamole (PERSANTINE) 75 MG tablet Take 75 mg by mouth 2 (two) times daily.     doxycycline (MONODOX) 100 MG capsule Take 100 mg by mouth 2 (two) times daily.     etodolac (LODINE) 300 MG capsule Take 300 mg by mouth 2 (two) times daily.     Hydroxychloroquine Sulfate 100 MG TABS Take 1 tablet by mouth 2 (two) times daily.     lovastatin (MEVACOR) 20 MG tablet Take 20 mg by mouth at bedtime.     magnesium oxide (MAG-OX) 400 MG tablet Take 400 mg by mouth daily.     Melatonin 10 MG CAPS Take 10 mg by mouth at bedtime.     metFORMIN (GLUCOPHAGE) 500 MG tablet Take 500 mg by mouth 2 (two) times daily.     omeprazole (PRILOSEC) 20 MG capsule Take 20 mg by mouth daily.     oxyCODONE-acetaminophen (PERCOCET/ROXICET) 5-325 MG tablet Take 1 tablet by mouth every 8 (eight) hours as needed for severe pain. 60 tablet 0   temazepam (RESTORIL) 22.5 MG capsule Take 1 capsule (22.5 mg total) by mouth at bedtime as needed for sleep. 30 capsule 0   Vitamin D, Ergocalciferol, (DRISDOL) 1.25 MG (50000 UNIT) CAPS capsule Take 50,000 Units by mouth every 7 (seven) days.     Cholecalciferol (VITAMIN D-3) 5000 UNIT/ML LIQD Take by mouth. (Patient not taking: Reported on 01/05/2023)     dexamethasone (DECADRON) 4 MG tablet Take 1 tablet (4 mg total) by mouth 2 (two) times daily with a meal. (Patient not taking:  Reported on 01/05/2023) 60 tablet 0   No current facility-administered medications for this encounter.    REVIEW OF SYSTEMS:  Notable for that above.   PHYSICAL EXAM:  height is 5\' 9"  (1.753 m) and weight is 160 lb 12.8 oz (72.9 kg). Her temperature is 97.5 F (36.4 C) (abnormal). Her blood pressure is 112/82 and her pulse is 75. Her respiration is 18 and oxygen saturation is 100%.     Physical Exam   HEENT: Oral cavity clear. Neck non-tender, no masses palpated. Extraocular movements intact, facial symmetry preserved, visual acuity grossly intact. CHEST: Lungs clear to auscultation. CARDIOVASCULAR: Heart regular rate and rhythm, no murmurs. ABDOMEN: Normal active bowel sounds, no tenderness to palpation. NEUROLOGICAL: Cranial nerves grossly intact. Upper extremity strength intact. Lower extremity  strength intact. Rapid alternating movements intact. Finger to nose test intact.  MSK: ambulatory, strength intact   Psych: pleasant, appropriate affect. Judgment intact   KPS = 80  100 - Normal; no complaints; no evidence of disease. 90   - Able to carry on normal activity; minor signs or symptoms of disease. 80   - Normal activity with effort; some signs or symptoms of disease. 24   - Cares for self; unable to carry on normal activity or to do active work. 60   - Requires occasional assistance, but is able to care for most of his personal needs. 50   - Requires considerable assistance and frequent medical care. 40   - Disabled; requires special care and assistance. 30   - Severely disabled; hospital admission is indicated although death not imminent. 20   - Very sick; hospital admission necessary; active supportive treatment necessary. 10   - Moribund; fatal processes progressing rapidly. 0     - Dead  Karnofsky DA, Abelmann WH, Craver LS and Burchenal [facility] [phone]) The use of the nitrogen mustards in the palliative treatment of carcinoma: with particular reference to bronchogenic carcinoma  Cancer 1 634-56   ECOG = 1  0 - Asymptomatic (Fully active, able to carry on all predisease activities without restriction)  1 - Symptomatic but completely ambulatory (Restricted in physically strenuous activity but ambulatory and able to carry out work of a light or sedentary nature. For example, light housework, office work)  2 - Symptomatic, <50% in bed during the day (Ambulatory and capable of all self care but unable to carry out any work activities. Up and about more than 50% of waking hours)  3 - Symptomatic, >50% in bed, but not bedbound (Capable of only limited self-care, confined to bed or chair 50% or more of waking hours)  4 - Bedbound (Completely disabled. Cannot carry on any self-care. Totally confined to bed or chair)  5 - Death   Santiago Glad MM, Creech RH, Tormey DC, et al. [phone]). "Toxicity and response criteria of the [facility]". Am. Evlyn Clines. Oncol. 5 (6): 649-55   LABORATORY DATA:  Lab Results  Component Value Date   WBC 6.5 09/15/2022   HGB 14.3 09/15/2022   HCT 42.0 09/15/2022   MCV 91.2 09/15/2022   PLT 212 09/15/2022   CMP     Component Value Date/Time   NA 139 09/15/2022 1025   K 4.3 09/15/2022 1025   CL 106 09/15/2022 1025   CO2 27 04/21/2022 1103   GLUCOSE 91 09/15/2022 1025   BUN 21 (H) 09/20/2022 1320   CREATININE 1.02 (H) 09/20/2022 1320   CALCIUM 9.1 04/21/2022 1103   PROT 7.1 04/21/2022 1103   ALBUMIN 4.0 04/21/2022 1103   AST 19 04/21/2022 1103   ALT 16 04/21/2022 1103   ALKPHOS 80 04/21/2022 1103   BILITOT 0.5 04/21/2022 1103   GFRNONAA >60 09/20/2022 1320         RADIOGRAPHY: MR TOTAL SPINE METS SCREENING  Result Date: 01/04/2023 CLINICAL DATA:  Metastatic breast cancer. Neck and back pain with numbness in the left foot. Cervical spinal cord lesion seen on recent head MRI. EXAM: MRI TOTAL SPINE WITHOUT AND WITH CONTRAST TECHNIQUE: Multisequence MR imaging of the spine from the cervical spine to the sacrum was  performed prior to and following IV contrast administration for evaluation of spinal metastatic disease. CONTRAST:  7mL GADAVIST GADOBUTROL 1 MMOL/ML IV SOLN COMPARISON:  Head MRI 12/30/2022 FINDINGS: Axial imaging is  motion degraded. MRI CERVICAL SPINE FINDINGS Alignment: Reversal of the normal cervical lordosis.  No listhesis. Vertebrae: No fracture, suspicious marrow lesion, or significant marrow edema. Cord: 6 mm solidly enhancing intramedullary lesion in the right ventral spinal cord at C2 with mild surrounding edema as seen on the recent prior head MRI. Posterior Fossa, vertebral arteries, paraspinal tissues: No metastasis in the left basal ganglia with associated edema, more fully evaluated on the recent head MRI. Disc levels: Mild spondylosis with mild spinal stenosis at C4-5 due to disc bulging. MRI THORACIC SPINE FINDINGS Alignment:  Normal. Vertebrae: No fracture, suspicious marrow lesion, or significant marrow edema. Cord: 3 mm enhancing lesion in the left dorsal cord at T3. 5 mm enhancing lesion in the left dorsal cord at T5. Paraspinal and other soft tissues: Unremarkable. Disc levels: No significant degenerative changes. MRI LUMBAR SPINE FINDINGS Segmentation:  Standard. Alignment:  Mild lumbar levoscoliosis.  No listhesis. Vertebrae: No fracture, suspicious marrow lesion, or significant marrow edema. Conus medullaris: Extends to the L1-2 level and appears normal. Suspected punctate enhancing foci along the cauda equina on the right at L1 (series 16, image 6), in the midline at L2 (series 16, image 8), and on the left at L4 (series 16, image 8), however motion artifact on axial imaging limits confirmation. Paraspinal and other soft tissues: Unremarkable. Disc levels: Minor spondylosis without evidence of significant stenosis. IMPRESSION: Three small enhancing lesions in the cervical and thoracic spinal cord and suspected punctate lesions along the cauda equina consistent with metastases. Mild cord  edema associated with the C2 lesion. Electronically Signed   By: Sebastian Ache M.D.   On: 01/04/2023 18:22   MR Brain W Wo Contrast  Result Date: 12/30/2022 CLINICAL DATA:  Breast cancer with metastatic disease EXAM: MRI HEAD WITHOUT AND WITH CONTRAST TECHNIQUE: Multiplanar, multiecho pulse sequences of the brain and surrounding structures were obtained without and with intravenous contrast. CONTRAST:  7mL GADAVIST GADOBUTROL 1 MMOL/ML IV SOLN COMPARISON:  Brain MRI 09/15/2022 FINDINGS: Brain: The 2.8 cm x 2.2 cm x 2.3 cm peripherally enhancing lesion centered in the left caudate head is decreased in size from 3.5 cm x 3.2 cm x 3.3 cm on the prior study. There is persistent enhancement along the ependymal lining of the left frontal horn (16-101). Surrounding vasogenic edema in the left frontal lobe extending medially across the genu of the corpus callosum and inferiorly to the internal and external capsules is also improved, with resolved signal abnormality in the midbrain. There is improved regional mass effect with persistent effacement of the left temporal horn and trace rightward midline shift, decreased from 1.0 cm on the prior study. There are no new intracranial lesions. There is no acute intracranial hemorrhage, extra-axial fluid collection, or acute infarct. Parenchymal volume is normal. The ventricles are otherwise normal in size. Parenchymal signal is otherwise normal. The pituitary and suprasellar region are normal. Vascular: Normal flow voids. Skull and upper cervical spine: There is no suspicious marrow signal abnormality. There is a 4 mm enhancing lesion in the cervical cord at the C2 level to the right of midline, new since the prior study. Sinuses/Orbits: The paranasal sinuses are clear. The globes and orbits are unremarkable. Other: The mastoid air cells and middle ear cavities are clear. IMPRESSION: Since 09/15/2022: 1. Decreased size of the enhancing metastatic lesion centered in the left  caudate head with improved surrounding edema and essentially resolved rightward midline shift. There is persistent enhancement along the ependymal lining of the left frontal horn.  2. 4 mm enhancing lesion in the cervical cord at the C2 level is new from the prior study. Recommend dedicated imaging of the total spine with and without contrast to evaluate for additional intramedullary metastatic lesions. Electronically Signed   By: Lesia Hausen M.D.   On: 12/30/2022 15:23   IR IMAGING GUIDED PORT INSERTION  Result Date: 12/20/2022 INDICATION: History of right-sided breast cancer, now with concern for metastatic disease. In need of durable intravenous access for chemotherapy administration. EXAM: IMPLANTED PORT A CATH PLACEMENT WITH ULTRASOUND AND FLUOROSCOPIC GUIDANCE COMPARISON:  CT of the chest, abdomen pelvis-09/15/2022 MEDICATIONS: None ANESTHESIA/SEDATION: Moderate (conscious) sedation was employed during this procedure as administered by the Interventional Radiology RN. A total of Versed 3 mg and Fentanyl 150 mcg was administered intravenously. Moderate Sedation Time: 24 minutes. The patient's level of consciousness and vital signs were monitored continuously by radiology nursing throughout the procedure under my direct supervision. Note, the patient's peripheral IV appeared to be malfunctioning and as such it is likely the patient only received 1 mg of Versed and 50 mcg of Fentanyl (which was administered via the port a catheter after placement) during this procedure. CONTRAST:  None FLUOROSCOPY TIME:  30 seconds (1 mGy) COMPLICATIONS: None immediate. PROCEDURE: The procedure, risks, benefits, and alternatives were explained to the patient. Questions regarding the procedure were encouraged and answered. The patient understands and consents to the procedure. The left neck and chest were prepped with chlorhexidine in a sterile fashion, and a sterile drape was applied covering the operative field. Maximum  barrier sterile technique with sterile gowns and gloves were used for the procedure. A timeout was performed prior to the initiation of the procedure. Local anesthesia was provided with 1% lidocaine with epinephrine. After creating a small venotomy incision, a micropuncture kit was utilized to access the internal jugular vein. Real-time ultrasound guidance was utilized for vascular access including the acquisition of a permanent ultrasound image documenting patency of the accessed vessel. The microwire was utilized to measure appropriate catheter length. A subcutaneous port pocket was then created along the upper chest wall utilizing a combination of sharp and blunt dissection. The pocket was irrigated with sterile saline. A single lumen AngioDynamics power injectable port was chosen for placement. The 8 Fr catheter was tunneled from the port pocket site to the venotomy incision. The port was placed in the pocket. The external catheter was trimmed to appropriate length. At the venotomy, an 8 Fr peel-away sheath was placed over a guidewire under fluoroscopic guidance. The catheter was then placed through the sheath and the sheath was removed. Final catheter positioning was confirmed and documented with a fluoroscopic spot radiograph. The port was accessed with a Huber needle, aspirated and flushed with heparinized saline. The venotomy site was closed with an interrupted 4-0 Vicryl suture. The port pocket incision was closed with interrupted 2-0 Vicryl suture. The skin was opposed with a running subcuticular 4-0 Vicryl suture. Dermabond and Steri-strips were applied to both incisions. Dressings were applied. The patient tolerated the procedure well without immediate post procedural complication. FINDINGS: After catheter placement, the tip lies within the superior cavoatrial junction. The catheter aspirates and flushes normally and is ready for immediate use. IMPRESSION: Successful placement of a left internal jugular  approach power injectable Port-A-Cath. The catheter is ready for immediate use. Electronically Signed   By: Simonne Come M.D.   On: 12/20/2022 15:14      IMPRESSION/PLAN:  Assessment and Plan - Breast cancer, Stage IV - Final  plan is pending discussion with Dr. Caswell Corwin.  Voicemail left for Dr. Melodye Ped.    Metastatic Spinal Tumors Multiple small tumors detected in cervical, thoracic, and cauda equina regions. Symptoms include neck and lower back pain, left leg sciatica, and left foot numbness. Discussed potential treatment options including radiation and intrathecal chemotherapy. -Plan for CT simulation for radiation planning. -Consider radiation to cervical lesion at C2 due to potential risk of spinal cord compression. -Consider whole spinal canal radiation if chemotherapy is not promising, acknowledging potential side effects including bone marrow suppression. -Consult with medical oncologist (Dr. Caswell Corwin) to discuss potential drug therapy options. -If radiation is pursued, recommend holding vitamin C infusions due to potential interference with radiation effects.  Chronic Pain Severe neck and lower back pain, worse at night. Current pain management with Tylenol and Advil, Percocet not effective. -Continue Tylenol and Advil as needed for pain, not to exceed 4000mg  of Tylenol per day. -Consider Aleve as an alternative to ibuprofen. -Recommend prescription for temazepam (Restoril) for sleep aid due to pain being worse at night. -If stronger pain medication needed, continue to coordinate with Dr. Georgiann Mohs to avoid multiple prescribers.  General Health Maintenance -Annual thyroid function test recommended due to potential impact of radiation and chemotherapy on thyroid function. -Continue monitoring for any changes in urinary or bowel function, and any new numbness in lower extremities or genitalia region.     Consent -Obtained today for total spinal canal/cauda equina radiation and placed in  patient's medical record, understanding that more focal RT (or possibly no RT) may be pursued after my discussion with Dr Caswell Corwin re: drug options. Dr Pamelia Hoit and Dr. Barbaraann Cao are aware and have been personally notified.   On date of service, in total, I spent 60 minutes on this encounter. Patient was seen in person.   __________________________________________   Lonie Peak, MD  This document serves as a record of services personally performed by Lonie Peak, MD. It was created on her behalf by Neena Rhymes, a trained medical scribe. The creation of this record is based on the scribe's personal observations and the provider's statements to them. This document has been checked and approved by the attending provider.

## 2023-01-05 ENCOUNTER — Encounter: Payer: Self-pay | Admitting: Radiation Oncology

## 2023-01-05 ENCOUNTER — Encounter: Payer: Self-pay | Admitting: Hematology and Oncology

## 2023-01-05 ENCOUNTER — Other Ambulatory Visit: Payer: Self-pay

## 2023-01-05 ENCOUNTER — Ambulatory Visit
Admission: RE | Admit: 2023-01-05 | Discharge: 2023-01-05 | Disposition: A | Discharge status: Home / Self Care | Payer: Commercial Managed Care - PPO | Source: Ambulatory Visit | Attending: Radiation Oncology

## 2023-01-05 ENCOUNTER — Ambulatory Visit
Admission: RE | Admit: 2023-01-05 | Discharge: 2023-01-05 | Disposition: A | Discharge status: Home / Self Care | Payer: Commercial Managed Care - PPO | Source: Ambulatory Visit | Attending: Radiation Oncology | Admitting: Radiation Oncology

## 2023-01-05 ENCOUNTER — Telehealth: Payer: Self-pay | Admitting: Hematology and Oncology

## 2023-01-05 ENCOUNTER — Ambulatory Visit: Payer: Commercial Managed Care - PPO

## 2023-01-05 ENCOUNTER — Other Ambulatory Visit: Payer: Self-pay | Admitting: Hematology and Oncology

## 2023-01-05 VITALS — BP 112/82 | HR 75 | Temp 97.5°F | Resp 18 | Ht 69.0 in | Wt 160.8 lb

## 2023-01-05 DIAGNOSIS — Z79899 Other long term (current) drug therapy: Secondary | ICD-10-CM | POA: Insufficient documentation

## 2023-01-05 DIAGNOSIS — C720 Malignant neoplasm of spinal cord: Secondary | ICD-10-CM | POA: Insufficient documentation

## 2023-01-05 DIAGNOSIS — Z510 Encounter for antineoplastic radiation therapy: Secondary | ICD-10-CM | POA: Insufficient documentation

## 2023-01-05 DIAGNOSIS — C7951 Secondary malignant neoplasm of bone: Secondary | ICD-10-CM | POA: Diagnosis present

## 2023-01-05 DIAGNOSIS — Z923 Personal history of irradiation: Secondary | ICD-10-CM | POA: Diagnosis not present

## 2023-01-05 DIAGNOSIS — Z803 Family history of malignant neoplasm of breast: Secondary | ICD-10-CM | POA: Insufficient documentation

## 2023-01-05 DIAGNOSIS — M4802 Spinal stenosis, cervical region: Secondary | ICD-10-CM | POA: Insufficient documentation

## 2023-01-05 DIAGNOSIS — Z7984 Long term (current) use of oral hypoglycemic drugs: Secondary | ICD-10-CM | POA: Insufficient documentation

## 2023-01-05 DIAGNOSIS — Z7902 Long term (current) use of antithrombotics/antiplatelets: Secondary | ICD-10-CM | POA: Diagnosis not present

## 2023-01-05 DIAGNOSIS — Z5111 Encounter for antineoplastic chemotherapy: Secondary | ICD-10-CM | POA: Diagnosis present

## 2023-01-05 DIAGNOSIS — C50411 Malignant neoplasm of upper-outer quadrant of right female breast: Secondary | ICD-10-CM

## 2023-01-05 DIAGNOSIS — C7931 Secondary malignant neoplasm of brain: Secondary | ICD-10-CM | POA: Insufficient documentation

## 2023-01-05 DIAGNOSIS — G8929 Other chronic pain: Secondary | ICD-10-CM | POA: Diagnosis not present

## 2023-01-05 DIAGNOSIS — Z171 Estrogen receptor negative status [ER-]: Secondary | ICD-10-CM | POA: Diagnosis not present

## 2023-01-05 DIAGNOSIS — R609 Edema, unspecified: Secondary | ICD-10-CM | POA: Insufficient documentation

## 2023-01-05 DIAGNOSIS — Z86718 Personal history of other venous thrombosis and embolism: Secondary | ICD-10-CM | POA: Diagnosis not present

## 2023-01-05 DIAGNOSIS — M50321 Other cervical disc degeneration at C4-C5 level: Secondary | ICD-10-CM | POA: Diagnosis not present

## 2023-01-05 LAB — PREGNANCY, URINE: Preg Test, Ur: NEGATIVE

## 2023-01-05 MED ORDER — PROCHLORPERAZINE MALEATE 10 MG PO TABS: 10.0000 mg | ORAL_TABLET | Freq: Four times a day (QID) | ORAL | 1 refills | Status: DC | PRN

## 2023-01-05 MED ORDER — TEMAZEPAM 22.5 MG PO CAPS
22.5000 mg | ORAL_CAPSULE | Freq: Every evening | ORAL | 0 refills | Status: DC | PRN
Start: 2023-01-05 — End: 2023-01-10

## 2023-01-05 MED ORDER — ONDANSETRON HCL 8 MG PO TABS: 8.0000 mg | ORAL_TABLET | Freq: Three times a day (TID) | ORAL | 1 refills | Status: DC | PRN

## 2023-01-05 MED ORDER — DEXAMETHASONE 4 MG PO TABS: ORAL_TABLET | ORAL | 1 refills | Status: DC

## 2023-01-05 MED ORDER — LIDOCAINE-PRILOCAINE 2.5-2.5 % EX CREA: TOPICAL_CREAM | CUTANEOUS | 3 refills | Status: DC

## 2023-01-05 NOTE — Telephone Encounter (Signed)
Multidisciplinary discussion regarding her best treatment option. Dr. Basilio Cairo discussed the case with Dr. Caswell Corwin at [facility]. I also discussed her case with Dr. Caswell Corwin.  Dr. Basilio Cairo and I had multiple conversations.  Treatment plan: Palliative radiation to C2 Systemic treatment with Enhertu to start next week Dr. Basilio Cairo sent a prescription for sleep aid Restoril Patient has leftover dexamethasone from previous.  I instructed her to start taking 4 mg of dexamethasone daily. Intractable pain: I will ask palliative care to address her pain issues.  If oxycodone is not working for her we may switch her to Dilaudid.  Or some other long-acting pain medication in addition to that.  We will get Lowella Bandy to address her pain issues.

## 2023-01-05 NOTE — Progress Notes (Signed)
DISCONTINUE OFF PATHWAY REGIMEN - Breast   [id number]:Pertuzumab 840/420 mg IV D1 + Trastuzumab 8/6 mg/kg IV D1 q21 Days:   A cycle is every 21 days:     Pertuzumab      Pertuzumab      Trastuzumab-xxxx      Trastuzumab-xxxx   **Always confirm dose/schedule in your pharmacy ordering system**  REASON: Disease Progression PRIOR TREATMENT: Off Pathway: Pertuzumab 840/420 mg IV D1 + Trastuzumab 8/6 mg/kg IV D1 q21 Days TREATMENT RESPONSE: Unable to Evaluate  START ON PATHWAY REGIMEN - Breast     A cycle is every 21 days:     Fam-trastuzumab deruxtecan-nxki   **Always confirm dose/schedule in your pharmacy ordering system**  Patient Characteristics: Distant Metastases or Locoregional Recurrent Disease - Unresected, M0 or Locally Advanced Unresectable Disease Progressing after Neoadjuvant and Local Therapies, M0, HER2 Positive, ER Negative, Chemotherapy, Second Line Therapeutic Status: Distant Metastases HER2 Status: Positive (+) ER Status: Negative (-) PR Status: Negative (-) Line of Therapy: Second Line Intent of Therapy: Non-Curative / Palliative Intent, Discussed with Patient

## 2023-01-06 ENCOUNTER — Ambulatory Visit: Payer: Commercial Managed Care - PPO | Admitting: Adult Health

## 2023-01-06 ENCOUNTER — Telehealth: Payer: Self-pay | Admitting: Hematology and Oncology

## 2023-01-06 ENCOUNTER — Other Ambulatory Visit: Payer: Self-pay

## 2023-01-06 DIAGNOSIS — Z170 Estrogen receptor positive status [ER+]: Secondary | ICD-10-CM

## 2023-01-06 DIAGNOSIS — Z171 Estrogen receptor negative status [ER-]: Secondary | ICD-10-CM

## 2023-01-06 DIAGNOSIS — C7931 Secondary malignant neoplasm of brain: Secondary | ICD-10-CM

## 2023-01-06 NOTE — Telephone Encounter (Signed)
Left patient a message in regards to scheduled appointment times/dates and also patient education and pharmacy education appointment

## 2023-01-07 ENCOUNTER — Telehealth: Payer: Self-pay | Admitting: Hematology and Oncology

## 2023-01-07 ENCOUNTER — Other Ambulatory Visit: Payer: Self-pay | Admitting: *Deleted

## 2023-01-07 ENCOUNTER — Telehealth: Payer: Self-pay

## 2023-01-07 ENCOUNTER — Encounter: Payer: Self-pay | Admitting: *Deleted

## 2023-01-07 MED ORDER — GABAPENTIN 300 MG PO CAPS: 300.0000 mg | ORAL_CAPSULE | Freq: Every day | ORAL | 1 refills | Status: DC

## 2023-01-07 NOTE — Telephone Encounter (Signed)
Redgie Grayer, I'm not able to see what the pharmacy said, so I'm going to defer to Dr. Basilio Cairo for this one

## 2023-01-07 NOTE — Telephone Encounter (Signed)
Pt called RN back after Rn leaving message this am. Pt stated on phone call that she heard from [facility] and they will be discussing her case with tumor board on Tuesday. As a result of the discussion she would like to push her start date back to Wednesday or Thursday next week (versus Tuesday). She is is looking at alternative treatments with [facility] and may decide do to go with them. She will keep [place] updated. Also discussed on this phone call was her sleep medication. Her pharmacy told her that the dose and number of pills called insurance would not cover. She stated the pharmacy told her they would cover 15mg  or 30mg , and the quantity would need to be changed to 30. Rn will give this feedback to Dr. Basilio Cairo and contact pt on Monday.

## 2023-01-07 NOTE — Telephone Encounter (Signed)
RN called attempting to gather information on medication request concern sent by pt. RN left call back information for pt. RN will also follow up with Dr. Basilio Cairo on Monday concerning this request.

## 2023-01-07 NOTE — Telephone Encounter (Signed)
spoke to this patient and she is requesting to push her radiation out to Wednesday if possible due to her making plans to see [facility] on 10/15 for a second opinion, she is also stating that the date for 10/16 is tentative as well because she is looking into a treatment that is only done in certain areas and she is wanting to research on that before starting treatment, patient stated that she may call or send a message through My Chart either wanting to cancel her appointments or she is wanting to keep them but we wont know an answer until Tuesday 10/15 after her appointment with [facility].

## 2023-01-07 NOTE — Progress Notes (Signed)
Verbal orders received from MD with okay to proceed with first dose of Enhertu with echo from 02/08/2022.  Pt will be scheduled for repeat Echo prior to second treatment.

## 2023-01-07 NOTE — Progress Notes (Signed)
Received mychart message from pt with complaint of increased neuropathy type pain, worse at night.  Verbal orders received from MD for pt to be prescribed Gabapentin 300 mg p.o at bedtime.  Prescription sent to pharmacy on file, pt educated and verbalized understanding.

## 2023-01-07 NOTE — Progress Notes (Signed)
Per MD, pt OK to proceed with tx week of 01/10/23 with echo from 02/08/2022. Pt will have echo before tx #2.

## 2023-01-08 ENCOUNTER — Telehealth: Payer: Self-pay | Admitting: Nurse Practitioner

## 2023-01-08 ENCOUNTER — Other Ambulatory Visit: Payer: Self-pay

## 2023-01-10 ENCOUNTER — Inpatient Hospital Stay: Payer: Commercial Managed Care - PPO

## 2023-01-10 ENCOUNTER — Inpatient Hospital Stay (HOSPITAL_BASED_OUTPATIENT_CLINIC_OR_DEPARTMENT_OTHER): Payer: Commercial Managed Care - PPO | Admitting: Internal Medicine

## 2023-01-10 ENCOUNTER — Telehealth: Payer: Self-pay

## 2023-01-10 ENCOUNTER — Other Ambulatory Visit: Payer: Self-pay | Admitting: Radiation Oncology

## 2023-01-10 ENCOUNTER — Encounter: Payer: Self-pay | Admitting: Hematology and Oncology

## 2023-01-10 ENCOUNTER — Inpatient Hospital Stay: Payer: Commercial Managed Care - PPO | Admitting: Pharmacist

## 2023-01-10 VITALS — BP 142/90 | HR 85 | Temp 97.9°F | Resp 18 | Ht 69.0 in | Wt 161.0 lb

## 2023-01-10 VITALS — BP 126/64 | HR 79 | Temp 98.3°F | Resp 16 | Wt 161.0 lb

## 2023-01-10 DIAGNOSIS — C7931 Secondary malignant neoplasm of brain: Secondary | ICD-10-CM

## 2023-01-10 DIAGNOSIS — G96198 Other disorders of meninges, not elsewhere classified: Secondary | ICD-10-CM | POA: Diagnosis not present

## 2023-01-10 DIAGNOSIS — Z171 Estrogen receptor negative status [ER-]: Secondary | ICD-10-CM

## 2023-01-10 MED ORDER — TEMAZEPAM 15 MG PO CAPS: 15.0000 mg | ORAL_CAPSULE | Freq: Every evening | ORAL | 0 refills | Status: DC | PRN

## 2023-01-10 NOTE — Progress Notes (Unsigned)
Palliative Medicine [facility]  Telephone:([phone]) [phone] Fax:(336) [phone]   Name: Tara White Date: 01/10/2023 MRN: [id number]  DOB: 1990/11/28  Patient Care Team: Samuella Bruin as PCP - General (Physician Assistant) Serena Croissant, MD as Consulting Physician (Hematology and Oncology) Abigail Miyamoto, MD as Consulting Physician (General Surgery) Lonie Peak, MD as Attending Physician (Radiation Oncology)    REASON FOR CONSULTATION: Tara White is a 32 y.o. 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 Palliative ask to see for symptom management and goals of care.    SOCIAL HISTORY:     reports that she has never smoked. She has never used smokeless tobacco. She reports current alcohol use of about 2.0 standard drinks of alcohol per week. She reports that she does not use drugs.  ADVANCE DIRECTIVES:  None on file  CODE STATUS: Full code  PAST MEDICAL HISTORY: Past Medical History:  Diagnosis Date   DVT (deep venous thrombosis) (HCC) 06/25/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   Family history of breast cancer 04/21/2021   right breast ca 03/2021    PAST SURGICAL HISTORY:  Past Surgical History:  Procedure Laterality Date   BREAST ENHANCEMENT SURGERY Left 06/25/2022   Procedure: MAMMOPLASTY AUGMENTATION, MASTOPEXY (BREAST);  Surgeon: Glenna Fellows, MD;  Location: [facility];  Service: Plastics;  Laterality: Left;   BREAST RECONSTRUCTION WITH PLACEMENT OF TISSUE EXPANDER AND ALLODERM Right 10/13/2021   Procedure: RIGHT BREAST RECONSTRUCTION WITH PLACEMENT OF TISSUE EXPANDER AND ALLODERM;  Surgeon: Glenna Fellows, MD;  Location: [facility];  Service: Plastics;  Laterality: Right;   CESAREAN SECTION N/A 12/10/2015   Procedure: CESAREAN SECTION;  Surgeon: Noland Fordyce, MD;  Location: [facility];  Service: Obstetrics;  Laterality: N/A;   IR CV LINE INJECTION  06/25/2021   IR IMAGING GUIDED PORT INSERTION  12/20/2022   IR RADIOLOGY PERIPHERAL GUIDED IV START  06/25/2021   IR US GUIDE VASC ACCESS LEFT  06/25/2021   IR VENO/EXT/UNI LEFT  06/25/2021   NODE DISSECTION Right 10/13/2021   Procedure: TARGETED LYMPH NODE DISSECTION;  Surgeon: Abigail Miyamoto, MD;  Location: [facility];  Service: General;  Laterality: Right;   PORT-A-CATH REMOVAL Left 05/27/2022   Procedure: REMOVAL PORT-A-CATH;  Surgeon: Abigail Miyamoto, MD;  Location: [facility];  Service: General;  Laterality: Left;   PORTACATH PLACEMENT Left 04/30/2021   Procedure: INSERTION PORT-A-CATH;  Surgeon: Abigail Miyamoto, MD;  Location: [facility];  Service: General;  Laterality: Left;   REMOVAL OF TISSUE EXPANDER AND PLACEMENT OF IMPLANT Right 06/25/2022   Procedure: REMOVAL OF TISSUE EXPANDER AND PLACEMENT OF IMPLANT;  Surgeon: Glenna Fellows, MD;  Location: [facility];  Service: Plastics;  Laterality: Right;   SIMPLE MASTECTOMY WITH AXILLARY SENTINEL NODE BIOPSY Right 10/13/2021   Procedure: RIGHT MASTECTOMY;  Surgeon: Abigail Miyamoto, MD;  Location: [facility];  Service: General;  Laterality: Right;   WISDOM TOOTH EXTRACTION      HEMATOLOGY/ONCOLOGY HISTORY:  Oncology History  Malignant neoplasm of upper-outer quadrant of right breast in female, estrogen receptor negative (HCC)  04/08/2021 Initial Diagnosis   History of right breast eczematous change to the nipple, patient had palpable lump in the right breast her mammogram 2.1 cm spiculated mass with extensive calcifications that span 11 cm.  Ultrasound revealed 3 cm mass at 10:00 plus small satellite lesions.  Biopsy  revealed grade 3 IDC ER 0%, PR 0%, HER2 3+ positive, Ki-67 30%   04/08/2021 Cancer Staging   Staging form: Breast, AJCC 8th Edition - Clinical stage from 04/08/2021: Stage IIB (cT2, cN1, cM0, G3, ER-,  PR-, HER2+) - Signed by Serena Croissant, MD on 04/15/2021 Stage prefix: Initial diagnosis Method of lymph node assessment: Axillary lymph node dissection Histologic grading system: 3 grade system   05/01/2021 - 11/26/2021 Chemotherapy   Patient is on Treatment Plan : BREAST  Docetaxel + Carboplatin + Trastuzumab + Pertuzumab  (TCHP) q21d      05/21/2021 Genetic Testing   Negative hereditary cancer genetic testing: no pathogenic variants detected in [facility] CustomNext-Cancer +RNAinsight Panel.  Report date is 05/21/2021.   The CustomNext-Cancer+RNAinsight panel offered by [employer] includes sequencing and rearrangement analysis for the following 47 genes:  APC, ATM, AXIN2, BARD1, BMPR1A, BRCA1, BRCA2, BRIP1, CDH1, CDK4, CDKN2A, CHEK2, DICER1, EPCAM, GREM1, HOXB13, MEN1, MLH1, MSH2, MSH3, MSH6, MUTYH, NBN, NF1, NF2, NTHL1, PALB2, PMS2, POLD1, POLE, PTEN, RAD51C, RAD51D, RECQL, RET, SDHA, SDHAF2, SDHB, SDHC, SDHD, SMAD4, SMARCA4, STK11, TP53, TSC1, TSC2, and VHL.  RNA data is routinely analyzed for use in variant interpretation for all genes.   09/04/2021 - 04/21/2022 Chemotherapy   Patient is on Treatment Plan : BREAST Trastuzumab  + Pertuzumab q21d x 13 cycles     10/13/2021 Surgery   right mastectomy with reconstruction: No residual malignancy complete pathologic response 0/5 lymph nodes negative    12/03/2021 - 01/11/2022 Radiation Therapy   Site Technique Total Dose (Gy) Dose per Fx (Gy) Completed Fx Beam Energies  Chest Wall, Right: CW_R_IMN 3D 50.4/50.4 1.8 28/28 6XFFF  Chest Wall, Right: CW_R_PAB_SCV 3D 50.4/50.4 1.8 28/28 6X, 10X     12/31/2022 - 12/31/2022 Chemotherapy   Patient is on Treatment Plan : BREAST Trastuzumab + Pertuzumab q21d     01/11/2023 -  Chemotherapy   Patient is on Treatment Plan : BREAST METASTATIC Fam-Trastuzumab Deruxtecan-nxki (Enhertu) (5.4) q21d       ALLERGIES:  has No Known Allergies.  MEDICATIONS:  Current Outpatient Medications  Medication Sig Dispense  Refill   ascorbic acid (VITAMIN C) 1000 MG tablet Take 1,000 mg by mouth daily.     carboxymethylcellulose (REFRESH TEARS) 0.5 % SOLN Place 1 drop into both eyes 3 (three) times daily as needed (Dry eye).     Cholecalciferol (VITAMIN D) 125 MCG (5000 UT) CAPS Take 5,000 Int'l Units/L by mouth daily.     Cholecalciferol (VITAMIN D-3) 5000 UNIT/ML LIQD Take by mouth. (Patient not taking: Reported on 01/05/2023)     cimetidine (TAGAMET) 400 MG tablet Take 400 mg by mouth 2 (two) times daily.     dexamethasone (DECADRON) 4 MG tablet Take 1 tablet (4 mg total) by mouth 2 (two) times daily with a meal. (Patient not taking: Reported on 01/05/2023) 60 tablet 0   dexamethasone (DECADRON) 4 MG tablet Take 2 tablets (8 mg) by mouth daily for 3 days starting the day after chemotherapy. Take with food. 30 tablet 1   diphenhydrAMINE (BENADRYL) 25 mg capsule Take 25 mg by mouth at bedtime as needed.     dipyridamole (PERSANTINE) 75 MG tablet Take 75 mg by mouth 2 (two) times daily.     doxycycline (MONODOX) 100 MG capsule Take 100 mg by mouth 2 (two) times daily.     etodolac (LODINE) 300 MG capsule Take 300 mg by mouth 2 (two) times daily.     gabapentin (NEURONTIN) 300 MG capsule Take 1  capsule (300 mg total) by mouth at bedtime. 30 capsule 1   Hydroxychloroquine Sulfate 100 MG TABS Take 1 tablet by mouth 2 (two) times daily.     lidocaine-prilocaine (EMLA) cream Apply to affected area once 30 g 3   lovastatin (MEVACOR) 20 MG tablet Take 20 mg by mouth at bedtime.     magnesium oxide (MAG-OX) 400 MG tablet Take 400 mg by mouth daily.     Melatonin 10 MG CAPS Take 10 mg by mouth at bedtime.     metFORMIN (GLUCOPHAGE) 500 MG tablet Take 500 mg by mouth 2 (two) times daily.     omeprazole (PRILOSEC) 20 MG capsule Take 20 mg by mouth daily.     ondansetron (ZOFRAN) 8 MG tablet Take 1 tablet (8 mg total) by mouth every 8 (eight) hours as needed for nausea or vomiting. Start on the third day after chemotherapy. 30  tablet 1   oxyCODONE-acetaminophen (PERCOCET/ROXICET) 5-325 MG tablet Take 1 tablet by mouth every 8 (eight) hours as needed for severe pain. 60 tablet 0   prochlorperazine (COMPAZINE) 10 MG tablet Take 1 tablet (10 mg total) by mouth every 6 (six) hours as needed for nausea or vomiting. 30 tablet 1   temazepam (RESTORIL) 15 MG capsule Take 1-2 capsules (15-30 mg total) by mouth at bedtime as needed for sleep. 30 capsule 0   Vitamin D, Ergocalciferol, (DRISDOL) 1.25 MG (50000 UNIT) CAPS capsule Take 50,000 Units by mouth every 7 (seven) days.     No current facility-administered medications for this visit.    VITAL SIGNS: There were no vitals taken for this visit. There were no vitals filed for this visit.  Estimated body mass index is 23.75 kg/m as calculated from the following:   Height as of 01/05/23: 5\' 9"  (1.753 m).   Weight as of 01/05/23: 160 lb 12.8 oz (72.9 kg).  LABS: CBC:    Component Value Date/Time   WBC 6.5 09/15/2022 1010   HGB 14.3 09/15/2022 1025   HGB 12.0 04/21/2022 1103   HGB 14.0 02/11/2015 1455   HCT 42.0 09/15/2022 1025   PLT 212 09/15/2022 1010   PLT 213 04/21/2022 1103   MCV 91.2 09/15/2022 1010   NEUTROABS 4.3 09/15/2022 1010   LYMPHSABS 1.7 09/15/2022 1010   MONOABS 0.4 09/15/2022 1010   EOSABS 0.1 09/15/2022 1010   BASOSABS 0.0 09/15/2022 1010   Comprehensive Metabolic Panel:    Component Value Date/Time   NA 139 09/15/2022 1025   K 4.3 09/15/2022 1025   CL 106 09/15/2022 1025   CO2 27 04/21/2022 1103   BUN 21 (H) 09/20/2022 1320   CREATININE 1.02 (H) 09/20/2022 1320   GLUCOSE 91 09/15/2022 1025   CALCIUM 9.1 04/21/2022 1103   AST 19 04/21/2022 1103   ALT 16 04/21/2022 1103   ALKPHOS 80 04/21/2022 1103   BILITOT 0.5 04/21/2022 1103   PROT 7.1 04/21/2022 1103   ALBUMIN 4.0 04/21/2022 1103    RADIOGRAPHIC STUDIES: MR TOTAL SPINE METS SCREENING  Result Date: 01/04/2023 CLINICAL DATA:  Metastatic breast cancer. Neck and back pain with  numbness in the left foot. Cervical spinal cord lesion seen on recent head MRI. EXAM: MRI TOTAL SPINE WITHOUT AND WITH CONTRAST TECHNIQUE: Multisequence MR imaging of the spine from the cervical spine to the sacrum was performed prior to and following IV contrast administration for evaluation of spinal metastatic disease. CONTRAST:  7mL GADAVIST GADOBUTROL 1 MMOL/ML IV SOLN COMPARISON:  Head MRI 12/30/2022 FINDINGS: Axial imaging  is motion degraded. MRI CERVICAL SPINE FINDINGS Alignment: Reversal of the normal cervical lordosis.  No listhesis. Vertebrae: No fracture, suspicious marrow lesion, or significant marrow edema. Cord: 6 mm solidly enhancing intramedullary lesion in the right ventral spinal cord at C2 with mild surrounding edema as seen on the recent prior head MRI. Posterior Fossa, vertebral arteries, paraspinal tissues: No metastasis in the left basal ganglia with associated edema, more fully evaluated on the recent head MRI. Disc levels: Mild spondylosis with mild spinal stenosis at C4-5 due to disc bulging. MRI THORACIC SPINE FINDINGS Alignment:  Normal. Vertebrae: No fracture, suspicious marrow lesion, or significant marrow edema. Cord: 3 mm enhancing lesion in the left dorsal cord at T3. 5 mm enhancing lesion in the left dorsal cord at T5. Paraspinal and other soft tissues: Unremarkable. Disc levels: No significant degenerative changes. MRI LUMBAR SPINE FINDINGS Segmentation:  Standard. Alignment:  Mild lumbar levoscoliosis.  No listhesis. Vertebrae: No fracture, suspicious marrow lesion, or significant marrow edema. Conus medullaris: Extends to the L1-2 level and appears normal. Suspected punctate enhancing foci along the cauda equina on the right at L1 (series 16, image 6), in the midline at L2 (series 16, image 8), and on the left at L4 (series 16, image 8), however motion artifact on axial imaging limits confirmation. Paraspinal and other soft tissues: Unremarkable. Disc levels: Minor spondylosis  without evidence of significant stenosis. IMPRESSION: Three small enhancing lesions in the cervical and thoracic spinal cord and suspected punctate lesions along the cauda equina consistent with metastases. Mild cord edema associated with the C2 lesion. Electronically Signed   By: Sebastian Ache M.D.   On: 01/04/2023 18:22   MR Brain W Wo Contrast  Result Date: 12/30/2022 CLINICAL DATA:  Breast cancer with metastatic disease EXAM: MRI HEAD WITHOUT AND WITH CONTRAST TECHNIQUE: Multiplanar, multiecho pulse sequences of the brain and surrounding structures were obtained without and with intravenous contrast. CONTRAST:  7mL GADAVIST GADOBUTROL 1 MMOL/ML IV SOLN COMPARISON:  Brain MRI 09/15/2022 FINDINGS: Brain: The 2.8 cm x 2.2 cm x 2.3 cm peripherally enhancing lesion centered in the left caudate head is decreased in size from 3.5 cm x 3.2 cm x 3.3 cm on the prior study. There is persistent enhancement along the ependymal lining of the left frontal horn (16-101). Surrounding vasogenic edema in the left frontal lobe extending medially across the genu of the corpus callosum and inferiorly to the internal and external capsules is also improved, with resolved signal abnormality in the midbrain. There is improved regional mass effect with persistent effacement of the left temporal horn and trace rightward midline shift, decreased from 1.0 cm on the prior study. There are no new intracranial lesions. There is no acute intracranial hemorrhage, extra-axial fluid collection, or acute infarct. Parenchymal volume is normal. The ventricles are otherwise normal in size. Parenchymal signal is otherwise normal. The pituitary and suprasellar region are normal. Vascular: Normal flow voids. Skull and upper cervical spine: There is no suspicious marrow signal abnormality. There is a 4 mm enhancing lesion in the cervical cord at the C2 level to the right of midline, new since the prior study. Sinuses/Orbits: The paranasal sinuses are  clear. The globes and orbits are unremarkable. Other: The mastoid air cells and middle ear cavities are clear. IMPRESSION: Since 09/15/2022: 1. Decreased size of the enhancing metastatic lesion centered in the left caudate head with improved surrounding edema and essentially resolved rightward midline shift. There is persistent enhancement along the ependymal lining of the left frontal  horn. 2. 4 mm enhancing lesion in the cervical cord at the C2 level is new from the prior study. Recommend dedicated imaging of the total spine with and without contrast to evaluate for additional intramedullary metastatic lesions. Electronically Signed   By: Lesia Hausen M.D.   On: 12/30/2022 15:23   PERFORMANCE STATUS (ECOG) : {CHL ONC ECOG UE:[id number]}  Review of Systems Unless otherwise noted, a complete review of systems is negative.  Physical Exam General: NAD Cardiovascular: regular rate and rhythm Pulmonary: clear ant fields Abdomen: soft, nontender, + bowel sounds Extremities: no edema, no joint deformities Skin: no rashes Neurological:  IMPRESSION: *** I introduced myself, Maygan RN, and Palliative's role in collaboration with the oncology team. Concept of Palliative Care was introduced as specialized medical care for people and their families living with serious illness.  It focuses on providing relief from the symptoms and stress of a serious illness.  The goal is to improve quality of life for both the patient and the family. Values and goals of care important to patient and family were attempted to be elicited.    We discussed *** current illness and what it means in the larger context of *** on-going co-morbidities. Natural disease trajectory and expectations were discussed.  I discussed the importance of continued conversation with family and their medical providers regarding overall plan of care and treatment options, ensuring decisions are within the context of the patients values and  GOCs.  PLAN: Established therapeutic relationship. Education provided on palliative's role in collaboration with their Oncology/Radiation team. I will plan to see patient back in 2-4 weeks in collaboration to other oncology appointments.    Patient expressed understanding and was in agreement with this plan. She also understands that She can call the clinic at any time with any questions, concerns, or complaints.   Thank you for your referral and allowing Palliative to assist in Tara White's care.   Number and complexity of problems addressed: ***HIGH - 1 or more chronic illnesses with SEVERE exacerbation, progression, or side effects of treatment - advanced cancer, pain. Any controlled substances utilized were prescribed in the context of palliative care.   Visit consisted of counseling and education dealing with the complex and emotionally intense issues of symptom management and palliative care in the setting of serious and potentially life-threatening illness.Greater than 50%  of this time was spent counseling and coordinating care related to the above assessment and plan.  Signed by: Willette Alma, AGPCNP-BC Palliative Medicine Team/[facility]   *Please note that this is a verbal dictation therefore any spelling or grammatical errors are due to the "[employer]" system interpretation.

## 2023-01-10 NOTE — Progress Notes (Signed)
[facility] at [facility] [address]  [place], [place] [address] [phone]   Interval Evaluation  Date of Service: 01/10/23 Patient Name: Tara White Patient MRN: [id number] Patient DOB: November 03, 1990 Provider: Henreitta Leber, MD  Identifying Statement:  Tara White is a 32 y.o. 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  Ultrasound revealed 3 cm mass at 10:00 plus small satellite lesions.  Biopsy revealed grade 3 IDC ER 0%, PR 0%, HER2 3+ positive, Ki-67 30%   04/08/2021 Cancer Staging   Staging form: Breast, AJCC 8th Edition - Clinical stage from 04/08/2021: Stage IIB (cT2, cN1, cM0, G3, ER-, PR-, HER2+) - Signed by Serena Croissant, MD on 04/15/2021 Stage prefix: Initial diagnosis Method of lymph node assessment: Axillary lymph node dissection Histologic grading system: 3 grade system   05/01/2021 - 11/26/2021 Chemotherapy   Patient is on Treatment Plan : BREAST  Docetaxel + Carboplatin + Trastuzumab + Pertuzumab  (TCHP) q21d      05/21/2021 Genetic Testing   Negative hereditary cancer genetic testing: no pathogenic variants detected in [facility] CustomNext-Cancer +RNAinsight Panel.  Report date is 05/21/2021.   The CustomNext-Cancer+RNAinsight panel offered by [employer] [facility] includes sequencing and rearrangement analysis for the following 47 genes:  APC, ATM, AXIN2, BARD1, BMPR1A, BRCA1, BRCA2, BRIP1, CDH1, CDK4, CDKN2A, CHEK2, DICER1, EPCAM, GREM1, HOXB13, MEN1, MLH1, MSH2, MSH3, MSH6, MUTYH, NBN, NF1, NF2, NTHL1, PALB2, PMS2, POLD1, POLE, PTEN,  RAD51C, RAD51D, RECQL, RET, SDHA, SDHAF2, SDHB, SDHC, SDHD, SMAD4, SMARCA4, STK11, TP53, TSC1, TSC2, and VHL.  RNA data is routinely analyzed for use in variant interpretation for all genes.   09/04/2021 - 04/21/2022 Chemotherapy   Patient is on Treatment Plan : BREAST Trastuzumab  + Pertuzumab q21d x 13 cycles     10/13/2021 Surgery   right mastectomy with reconstruction: No residual malignancy complete pathologic response 0/5 lymph nodes negative    12/03/2021 - 01/11/2022 Radiation Therapy   Site Technique Total Dose (Gy) Dose per Fx (Gy) Completed Fx Beam Energies  Chest Wall, Right: CW_R_IMN 3D 50.4/50.4 1.8 28/28 6XFFF  Chest Wall, Right: CW_R_PAB_SCV 3D 50.4/50.4 1.8 28/28 6X, 10X     12/31/2022 - 12/31/2022 Chemotherapy   Patient is on Treatment Plan : BREAST Trastuzumab + Pertuzumab q21d     01/11/2023 -  Chemotherapy   Patient is on Treatment Plan : BREAST METASTATIC Fam-Trastuzumab Deruxtecan-nxki (Enhertu) (5.4) q21d      CNS Oncologic History 10/15/22: Completes fractionated SRS to left caudate head metastasis  Interval History: Tara White presents today after recent MRI studies.  She describes new onset right leg pain, shooting down back of her leg from lower back.  Also now having numbness along the lower anterior portion of the left foot, as well as patch of numbness on the left shoulder.  Fullness and aching in her neck has worsened as well.  She has tried advil, naltrexone, oxycodone and gabapentin 300mg  at night for the symptoms, none of which has clearly helped.   She denies issues with urination or numbness in groin.  Walking is normal except for the numbness impairing the left  foot. Still has not initiated systemic therapy yet for HER2+ breast cancer, has appt upcoming with Dr. Caswell White at [facility].  Prior- Presented to neurologic attention in mid-June with ~1 week history of double vision and neck pain.  CNS imaging demonstrated large left frontal mass consistent with  metastasis from breast cancer.  She recently completed radiation therapy at [facility].  Currently feeling normal, at baseline aside from swelling in legs and face from decadron.  Currently dosign 6mg  daily with taper schedule in place.  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  Medications: Current Outpatient Medications on File Prior to Visit  Medication Sig Dispense Refill   ascorbic acid (VITAMIN C) 1000 MG tablet Take 1,000 mg by mouth daily.     carboxymethylcellulose (REFRESH TEARS) 0.5 % SOLN Place 1 drop into both eyes 3 (three) times daily as needed (Dry eye).     Cholecalciferol (VITAMIN D) 125 MCG (5000 UT) CAPS Take 5,000 Int'l Units/L by mouth daily.     Cholecalciferol (VITAMIN D-3) 5000 UNIT/ML LIQD Take by mouth. (Patient not taking: Reported on 01/05/2023)     cimetidine (TAGAMET) 400 MG tablet Take 400 mg by mouth 2 (two) times daily.     dexamethasone (DECADRON) 4 MG tablet Take 1 tablet (4 mg total) by mouth 2 (two) times daily with a meal. (Patient not taking: Reported on 01/05/2023) 60 tablet 0   dexamethasone (DECADRON) 4 MG tablet Take 2 tablets (8 mg) by mouth daily for 3 days starting the day after chemotherapy. Take with food. 30 tablet 1   diphenhydrAMINE (BENADRYL) 25 mg capsule Take 25 mg by mouth at bedtime as needed.     dipyridamole (PERSANTINE) 75 MG tablet Take 75 mg by mouth 2 (two) times daily.     doxycycline (MONODOX) 100 MG capsule Take 100 mg by mouth 2 (two) times daily.     etodolac (LODINE) 300 MG capsule Take 300 mg by mouth 2 (two) times daily.     gabapentin (NEURONTIN) 300 MG capsule Take 1 capsule (300 mg total) by mouth at bedtime. 30 capsule 1   Hydroxychloroquine Sulfate 100 MG TABS Take 1 tablet by mouth 2 (two) times daily.     lidocaine-prilocaine (EMLA) cream Apply to affected area once 30 g 3   lovastatin (MEVACOR) 20 MG tablet Take 20 mg by mouth at bedtime.     magnesium oxide (MAG-OX) 400 MG tablet  Take 400 mg by mouth daily.     Melatonin 10 MG CAPS Take 10 mg by mouth at bedtime.     metFORMIN (GLUCOPHAGE) 500 MG tablet Take 500 mg by mouth 2 (two) times daily.     omeprazole (PRILOSEC) 20 MG capsule Take 20 mg by mouth daily.     ondansetron (ZOFRAN) 8 MG tablet Take 1 tablet (8 mg total) by mouth every 8 (eight) hours as needed for nausea or vomiting. Start on the third day after chemotherapy. 30 tablet 1   oxyCODONE-acetaminophen (PERCOCET/ROXICET) 5-325 MG tablet Take 1 tablet by mouth every 8 (eight) hours as needed for severe pain. 60 tablet 0   prochlorperazine (COMPAZINE) 10 MG tablet Take 1 tablet (10 mg total) by mouth every 6 (six) hours as needed for nausea or vomiting. 30 tablet 1   Vitamin D, Ergocalciferol, (DRISDOL) 1.25 MG (50000 UNIT) CAPS capsule Take 50,000 Units by mouth every 7 (seven) days.     No current facility-administered medications on file prior to visit.  Allergies: No Known Allergies Past Medical History:  Past Medical History:  Diagnosis Date   DVT (deep venous thrombosis) (HCC) 06/25/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   Family history of breast cancer 04/21/2021   right breast ca 03/2021   Past Surgical History:  Past Surgical History:  Procedure Laterality Date   BREAST ENHANCEMENT SURGERY Left 06/25/2022   Procedure: MAMMOPLASTY AUGMENTATION, MASTOPEXY (BREAST);  Surgeon: Glenna Fellows, MD;  Location: [facility] OR;  Service: Plastics;  Laterality: Left;   BREAST RECONSTRUCTION WITH PLACEMENT OF TISSUE EXPANDER AND ALLODERM Right 10/13/2021   Procedure: RIGHT BREAST RECONSTRUCTION WITH PLACEMENT OF TISSUE EXPANDER AND ALLODERM;  Surgeon: Glenna Fellows, MD;  Location: [facility];  Service: Plastics;  Laterality: Right;   CESAREAN SECTION N/A 12/10/2015   Procedure: CESAREAN SECTION;  Surgeon: Noland Fordyce, MD;  Location: [facility];  Service: Obstetrics;  Laterality: N/A;   IR CV LINE INJECTION  06/25/2021   IR IMAGING GUIDED PORT  INSERTION  12/20/2022   IR RADIOLOGY PERIPHERAL GUIDED IV START  06/25/2021   IR US GUIDE VASC ACCESS LEFT  06/25/2021   IR VENO/EXT/UNI LEFT  06/25/2021   NODE DISSECTION Right 10/13/2021   Procedure: TARGETED LYMPH NODE DISSECTION;  Surgeon: Abigail Miyamoto, MD;  Location: [facility];  Service: General;  Laterality: Right;   PORT-A-CATH REMOVAL Left 05/27/2022   Procedure: REMOVAL PORT-A-CATH;  Surgeon: Abigail Miyamoto, MD;  Location: [facility];  Service: General;  Laterality: Left;   PORTACATH PLACEMENT Left 04/30/2021   Procedure: INSERTION PORT-A-CATH;  Surgeon: Abigail Miyamoto, MD;  Location: [facility];  Service: General;  Laterality: Left;   REMOVAL OF TISSUE EXPANDER AND PLACEMENT OF IMPLANT Right 06/25/2022   Procedure: REMOVAL OF TISSUE EXPANDER AND PLACEMENT OF IMPLANT;  Surgeon: Glenna Fellows, MD;  Location: [facility];  Service: Plastics;  Laterality: Right;   SIMPLE MASTECTOMY WITH AXILLARY SENTINEL NODE BIOPSY Right 10/13/2021   Procedure: RIGHT MASTECTOMY;  Surgeon: Abigail Miyamoto, MD;  Location:  SURGERY CENTER;  Service: General;  Laterality: Right;   WISDOM TOOTH EXTRACTION     Social History:  Social History   Socioeconomic History   Marital status: Married    Spouse name: Not on file   Number of children: Not on file   Years of education: Not on file   Highest education level: Not on file  Occupational History   Not on file  Tobacco Use   Smoking status: Never   Smokeless tobacco: Never  Vaping Use   Vaping status: Not on file  Substance and Sexual Activity   Alcohol use: Yes    Alcohol/week: 2.0 standard drinks of alcohol    Types: 2 Standard drinks or equivalent per week    Comment: rarely   Drug use: No   Sexual activity: Not Currently    Partners: Male    Birth control/protection: Other-see comments    Comment: cycles have not resumed since receiving chemotherapy  Other Topics Concern   Not on  file  Social History Narrative   Not on file   Social Determinants of Health   Financial Resource Strain: Low Risk  (11/01/2022)   Received from [facility]   Overall Financial Resource Strain (CARDIA)    Difficulty of Paying Living Expenses: Not hard at all  Food Insecurity: No Food Insecurity (11/01/2022)   Received from [facility]   Hunger Vital Sign    Worried About Running Out of Food in the Last Year: Never  true    Ran Out of Food in the Last Year: Never true  Transportation Needs: No Transportation Needs (11/01/2022)   Received from [facility] - Transportation    Lack of Transportation (Medical): No    Lack of Transportation (Non-Medical): No  Physical Activity: Not on file  Stress: Not on file  Social Connections: Unknown (07/27/2021)   [facility]   Social Network    Social Network: Not on file  Intimate Partner Violence: Not At Risk (09/15/2022)   Humiliation, Afraid, Rape, and Kick questionnaire    Fear of Current or Ex-Partner: No    Emotionally Abused: No    Physically Abused: No    Sexually Abused: No   Family History:  Family History  Problem Relation Age of Onset   Hypothyroidism Mother    Breast cancer Maternal Grandmother 42   Skin cancer Maternal Grandfather    Heart failure Paternal Grandfather     Review of Systems: Constitutional: Doesn't report fevers, chills or abnormal weight loss Eyes: Doesn't report blurriness of vision Ears, nose, mouth, throat, and face: Doesn't report sore throat Respiratory: Doesn't report cough, dyspnea or wheezes Cardiovascular: Doesn't report palpitation, chest discomfort  Gastrointestinal:  Doesn't report nausea, constipation, diarrhea GU: Doesn't report incontinence Skin: Doesn't report skin rashes Neurological: Per HPI Musculoskeletal: Doesn't report joint pain Behavioral/Psych: Doesn't report anxiety  Physical Exam: Vitals:   01/10/23 1209  BP: 126/64  Pulse: 79   Resp: 16  Temp: 98.3 F (36.8 C)  SpO2: 100%   KPS: 90. General: Alert, cooperative, pleasant, in no acute distress Head: Normal EENT: No conjunctival injection or scleral icterus.  Lungs: Resp effort normal Cardiac: Regular rate Abdomen: Non-distended abdomen Skin: No rashes cyanosis or petechiae. Extremities: No clubbing or edema  Neurologic Exam: Mental Status: Awake, alert, attentive to examiner. Oriented to self and environment. Language is fluent with intact comprehension.  Cranial Nerves: Visual acuity is grossly normal. Visual fields are full. Extra-ocular movements intact. No ptosis. Face is symmetric Motor: Tone and bulk are normal. Power is full in both arms and legs. Reflexes are symmetric, no pathologic reflexes present.  Sensory: Impaired in left arm and leg, patchy Gait: Normal.   Labs: I have reviewed the data as listed    Component Value Date/Time   NA 139 09/15/2022 1025   K 4.3 09/15/2022 1025   CL 106 09/15/2022 1025   CO2 27 04/21/2022 1103   GLUCOSE 91 09/15/2022 1025   BUN 21 (H) 09/20/2022 1320   CREATININE 1.02 (H) 09/20/2022 1320   CALCIUM 9.1 04/21/2022 1103   PROT 7.1 04/21/2022 1103   ALBUMIN 4.0 04/21/2022 1103   AST 19 04/21/2022 1103   ALT 16 04/21/2022 1103   ALKPHOS 80 04/21/2022 1103   BILITOT 0.5 04/21/2022 1103   GFRNONAA >60 09/20/2022 1320   GFRAA >60 12/17/2015 0610   Lab Results  Component Value Date   WBC 6.5 09/15/2022   NEUTROABS 4.3 09/15/2022   HGB 14.3 09/15/2022   HCT 42.0 09/15/2022   MCV 91.2 09/15/2022   PLT 212 09/15/2022    Imaging:  [facility] Clinician Interpretation: I have personally reviewed the CNS images as listed.  My interpretation, in the context of the patient's clinical presentation, is progressive disease with leptomeningeal dissemination  MR TOTAL SPINE METS SCREENING  Result Date: 01/04/2023 CLINICAL DATA:  Metastatic breast cancer. Neck and back pain with numbness in the left foot. Cervical  spinal cord lesion seen  on recent head MRI. EXAM: MRI TOTAL SPINE WITHOUT AND WITH CONTRAST TECHNIQUE: Multisequence MR imaging of the spine from the cervical spine to the sacrum was performed prior to and following IV contrast administration for evaluation of spinal metastatic disease. CONTRAST:  7mL GADAVIST GADOBUTROL 1 MMOL/ML IV SOLN COMPARISON:  Head MRI 12/30/2022 FINDINGS: Axial imaging is motion degraded. MRI CERVICAL SPINE FINDINGS Alignment: Reversal of the normal cervical lordosis.  No listhesis. Vertebrae: No fracture, suspicious marrow lesion, or significant marrow edema. Cord: 6 mm solidly enhancing intramedullary lesion in the right ventral spinal cord at C2 with mild surrounding edema as seen on the recent prior head MRI. Posterior Fossa, vertebral arteries, paraspinal tissues: No metastasis in the left basal ganglia with associated edema, more fully evaluated on the recent head MRI. Disc levels: Mild spondylosis with mild spinal stenosis at C4-5 due to disc bulging. MRI THORACIC SPINE FINDINGS Alignment:  Normal. Vertebrae: No fracture, suspicious marrow lesion, or significant marrow edema. Cord: 3 mm enhancing lesion in the left dorsal cord at T3. 5 mm enhancing lesion in the left dorsal cord at T5. Paraspinal and other soft tissues: Unremarkable. Disc levels: No significant degenerative changes. MRI LUMBAR SPINE FINDINGS Segmentation:  Standard. Alignment:  Mild lumbar levoscoliosis.  No listhesis. Vertebrae: No fracture, suspicious marrow lesion, or significant marrow edema. Conus medullaris: Extends to the L1-2 level and appears normal. Suspected punctate enhancing foci along the cauda equina on the right at L1 (series 16, image 6), in the midline at L2 (series 16, image 8), and on the left at L4 (series 16, image 8), however motion artifact on axial imaging limits confirmation. Paraspinal and other soft tissues: Unremarkable. Disc levels: Minor spondylosis without evidence of significant  stenosis. IMPRESSION: Three small enhancing lesions in the cervical and thoracic spinal cord and suspected punctate lesions along the cauda equina consistent with metastases. Mild cord edema associated with the C2 lesion. Electronically Signed   By: Sebastian Ache M.D.   On: 01/04/2023 18:22   MR Brain W Wo Contrast  Result Date: 12/30/2022 CLINICAL DATA:  Breast cancer with metastatic disease EXAM: MRI HEAD WITHOUT AND WITH CONTRAST TECHNIQUE: Multiplanar, multiecho pulse sequences of the brain and surrounding structures were obtained without and with intravenous contrast. CONTRAST:  7mL GADAVIST GADOBUTROL 1 MMOL/ML IV SOLN COMPARISON:  Brain MRI 09/15/2022 FINDINGS: Brain: The 2.8 cm x 2.2 cm x 2.3 cm peripherally enhancing lesion centered in the left caudate head is decreased in size from 3.5 cm x 3.2 cm x 3.3 cm on the prior study. There is persistent enhancement along the ependymal lining of the left frontal horn (16-101). Surrounding vasogenic edema in the left frontal lobe extending medially across the genu of the corpus callosum and inferiorly to the internal and external capsules is also improved, with resolved signal abnormality in the midbrain. There is improved regional mass effect with persistent effacement of the left temporal horn and trace rightward midline shift, decreased from 1.0 cm on the prior study. There are no new intracranial lesions. There is no acute intracranial hemorrhage, extra-axial fluid collection, or acute infarct. Parenchymal volume is normal. The ventricles are otherwise normal in size. Parenchymal signal is otherwise normal. The pituitary and suprasellar region are normal. Vascular: Normal flow voids. Skull and upper cervical spine: There is no suspicious marrow signal abnormality. There is a 4 mm enhancing lesion in the cervical cord at the C2 level to the right of midline, new since the prior study. Sinuses/Orbits: The paranasal sinuses are  clear. The globes and orbits are  unremarkable. Other: The mastoid air cells and middle ear cavities are clear. IMPRESSION: Since 09/15/2022: 1. Decreased size of the enhancing metastatic lesion centered in the left caudate head with improved surrounding edema and essentially resolved rightward midline shift. There is persistent enhancement along the ependymal lining of the left frontal horn. 2. 4 mm enhancing lesion in the cervical cord at the C2 level is new from the prior study. Recommend dedicated imaging of the total spine with and without contrast to evaluate for additional intramedullary metastatic lesions. Electronically Signed   By: Lesia Hausen M.D.   On: 12/30/2022 15:23   IR IMAGING GUIDED PORT INSERTION  Result Date: 12/20/2022 INDICATION: History of right-sided breast cancer, now with concern for metastatic disease. In need of durable intravenous access for chemotherapy administration. EXAM: IMPLANTED PORT A CATH PLACEMENT WITH ULTRASOUND AND FLUOROSCOPIC GUIDANCE COMPARISON:  CT of the chest, abdomen pelvis-09/15/2022 MEDICATIONS: None ANESTHESIA/SEDATION: Moderate (conscious) sedation was employed during this procedure as administered by the Interventional Radiology RN. A total of Versed 3 mg and Fentanyl 150 mcg was administered intravenously. Moderate Sedation Time: 24 minutes. The patient's level of consciousness and vital signs were monitored continuously by radiology nursing throughout the procedure under my direct supervision. Note, the patient's peripheral IV appeared to be malfunctioning and as such it is likely the patient only received 1 mg of Versed and 50 mcg of Fentanyl (which was administered via the port a catheter after placement) during this procedure. CONTRAST:  None FLUOROSCOPY TIME:  30 seconds (1 mGy) COMPLICATIONS: None immediate. PROCEDURE: The procedure, risks, benefits, and alternatives were explained to the patient. Questions regarding the procedure were encouraged and answered. The patient understands  and consents to the procedure. The left neck and chest were prepped with chlorhexidine in a sterile fashion, and a sterile drape was applied covering the operative field. Maximum barrier sterile technique with sterile gowns and gloves were used for the procedure. A timeout was performed prior to the initiation of the procedure. Local anesthesia was provided with 1% lidocaine with epinephrine. After creating a small venotomy incision, a micropuncture kit was utilized to access the internal jugular vein. Real-time ultrasound guidance was utilized for vascular access including the acquisition of a permanent ultrasound image documenting patency of the accessed vessel. The microwire was utilized to measure appropriate catheter length. A subcutaneous port pocket was then created along the upper chest wall utilizing a combination of sharp and blunt dissection. The pocket was irrigated with sterile saline. A single lumen AngioDynamics power injectable port was chosen for placement. The 8 Fr catheter was tunneled from the port pocket site to the venotomy incision. The port was placed in the pocket. The external catheter was trimmed to appropriate length. At the venotomy, an 8 Fr peel-away sheath was placed over a guidewire under fluoroscopic guidance. The catheter was then placed through the sheath and the sheath was removed. Final catheter positioning was confirmed and documented with a fluoroscopic spot radiograph. The port was accessed with a Huber needle, aspirated and flushed with heparinized saline. The venotomy site was closed with an interrupted 4-0 Vicryl suture. The port pocket incision was closed with interrupted 2-0 Vicryl suture. The skin was opposed with a running subcuticular 4-0 Vicryl suture. Dermabond and Steri-strips were applied to both incisions. Dressings were applied. The patient tolerated the procedure well without immediate post procedural complication. FINDINGS: After catheter placement, the tip  lies within the superior cavoatrial junction. The catheter aspirates  and flushes normally and is ready for immediate use. IMPRESSION: Successful placement of a left internal jugular approach power injectable Port-A-Cath. The catheter is ready for immediate use. Electronically Signed   By: Simonne Come M.D.   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  Leg pain, numbness are all secondary to spine disease.  Brain MRI demonstrates stable findings, encouraging response to radiosurgery.  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  Treatments are unproven, and may consist of radiation and/or systemic therapy.  We generally do not recommend intrathecal chemotherapy here, although we encouraged seeking out Dr. Caswell White' opinion.    There may be role for Tucatinib based systemic therapy: Tucatinib-trastuzumab-capecitabine for treatment of leptomeningeal metastasis in HER2+ breast cancer: [id number] phase 2 study results. Authors: Marletta Lor, et al.  In the above study subgroup, all 13 patients with targetable LMD burden demonstrated stable disease at first response assessment.    Will con't to follow with Dr. Caswell White and Dr. Pamelia Hoit for breast cancer systemic therapy, potential plan for Tucatinib.  If recommended, can consider large field spine irradiation as well.  For neuropathic pain, recommended uptitration of Gabapentin.  May take 600mg  BID and increase to 900mg  HS or BID if necessary.  We will call her in 1 week to continue the titration and check-in regarding plans from [facility] team.  We appreciate the opportunity to participate in the care of Lilliah  Jennabelle Dix.    All questions were answered. The patient knows to call the clinic with any problems, questions or concerns. No barriers to learning were detected.  The total time spent in the encounter was 40 minutes and more than 50% was on counseling and review of test results   Tara Leber, MD Medical Director of Neuro-Oncology [facility] 01/10/23 12:14 PM

## 2023-01-10 NOTE — Progress Notes (Addendum)
[facility]       Telephone: [phone]?Fax: [phone]   Oncology Clinical Pharmacist Practitioner Progress Note  Tara White is a 32 year old female with a diagnosis of metastatic breast cancer and leptomeningeal disease currently under the care of Dr. Serena Croissant. They were contacted today via in-person. Today, she presents to clinic alone.   Interval History Difficult case currently also under simultaneous review by [facility] oncology. Initial plan was for her to start Enhertu and radiation therapy here starting on 01/12/23. After earlier discussion with Dr. Charleston Ropes, Ms. Simoneau has elected to forgo both radiation and treatment with Enhertu for now. There may be a potential role for tucatinib based systemic therapy in clinical trials. Plan for [facility] tumor board review for her case on 01/11/23. She expects to hear back from Dr. Caswell Corwin later this week on what her next steps would be. She reports she was told that she may need to be seen at [facility] if [facility] is unable to enroll her in a clinical trial.   The potential for drug interactions was also discussed with her holistic medication regimen. The following were found to be inhibitors of CYP3A4 which may potentially increase tucatinib concentrations and therefore the risk of adverse effects:  Quercetin Berberine Turmeric Resveratrol Histaquel EGCG  Ms. Schumpert was made aware of this and all questions were answered. She is aware she can reach out to the clinic should she have any additional questions regarding her treatment that she may have.   Follow-Up Plan Discontinue plan for Enhertu treatment  Follow-up treatment plan after [facility] tumor board Message was sent to [facility] clinical team including Neuro Oncology, Breast Oncology, and Radiation Oncology.  Ms. Geck participated in the discussion, expressed understanding, and voiced agreement with the above plan. All questions were answered to her satisfaction. The  patient was advised to contact the clinic at [phone] with any questions or concerns prior to her return visit.  Clinical pharmacy will continue to support Ms. Grays and Dr. Serena Croissant as needed.  I spent 20 minutes assessing the patient.   Jerry Caras, PharmD PGY2 Oncology Pharmacy Resident   01/10/2023 1:39 PM

## 2023-01-10 NOTE — Telephone Encounter (Signed)
RN called pt to let her know that her radiation start date had been pushed back to Wednesday per pt requests. Pt case is being presented at [facility] Tumor board on Tuesday. Pt would like Dr. Basilio Cairo to reach out to [facility] on Tuesday after Tumor board to get her input as well. Rn also let pt now that her sleep meds were called as well. Pt stated she already got a message from the pharmacy about this medication. She was grateful for the phone call and will call with any needs.

## 2023-01-11 ENCOUNTER — Ambulatory Visit: Payer: Commercial Managed Care - PPO | Admitting: Radiation Oncology

## 2023-01-11 ENCOUNTER — Encounter: Payer: Self-pay | Admitting: Radiation Oncology

## 2023-01-11 ENCOUNTER — Other Ambulatory Visit: Payer: Self-pay | Admitting: Pharmacist

## 2023-01-11 ENCOUNTER — Encounter: Payer: Self-pay | Admitting: Nurse Practitioner

## 2023-01-11 ENCOUNTER — Encounter: Payer: Self-pay | Admitting: Hematology and Oncology

## 2023-01-11 ENCOUNTER — Inpatient Hospital Stay (HOSPITAL_BASED_OUTPATIENT_CLINIC_OR_DEPARTMENT_OTHER): Payer: Commercial Managed Care - PPO | Admitting: Internal Medicine

## 2023-01-11 ENCOUNTER — Inpatient Hospital Stay (HOSPITAL_BASED_OUTPATIENT_CLINIC_OR_DEPARTMENT_OTHER): Payer: Commercial Managed Care - PPO | Admitting: Nurse Practitioner

## 2023-01-11 VITALS — BP 130/80 | HR 66 | Temp 98.0°F | Resp 16 | Ht 69.0 in | Wt 161.3 lb

## 2023-01-11 DIAGNOSIS — Z171 Estrogen receptor negative status [ER-]: Secondary | ICD-10-CM

## 2023-01-11 DIAGNOSIS — Z7189 Other specified counseling: Secondary | ICD-10-CM

## 2023-01-11 DIAGNOSIS — R530 Neoplastic (malignant) related fatigue: Secondary | ICD-10-CM

## 2023-01-11 DIAGNOSIS — G893 Neoplasm related pain (acute) (chronic): Secondary | ICD-10-CM

## 2023-01-11 DIAGNOSIS — M792 Neuralgia and neuritis, unspecified: Secondary | ICD-10-CM | POA: Diagnosis not present

## 2023-01-11 DIAGNOSIS — Z510 Encounter for antineoplastic radiation therapy: Secondary | ICD-10-CM | POA: Diagnosis not present

## 2023-01-11 DIAGNOSIS — G96198 Other disorders of meninges, not elsewhere classified: Secondary | ICD-10-CM

## 2023-01-11 DIAGNOSIS — Z515 Encounter for palliative care: Secondary | ICD-10-CM

## 2023-01-11 DIAGNOSIS — G2581 Restless legs syndrome: Secondary | ICD-10-CM

## 2023-01-11 DIAGNOSIS — C50411 Malignant neoplasm of upper-outer quadrant of right female breast: Secondary | ICD-10-CM

## 2023-01-11 MED ORDER — ROPINIROLE HCL 0.25 MG PO TABS: 0.2500 mg | ORAL_TABLET | Freq: Every day | ORAL | 0 refills | Status: DC

## 2023-01-11 MED ORDER — GABAPENTIN 300 MG PO CAPS: ORAL_CAPSULE | ORAL | 0 refills | Status: DC

## 2023-01-11 NOTE — Progress Notes (Signed)
This morning I conferred with the oncology team at [facility] where our patient's case was presented.  The consensus is that she should proceed with radiation to her spine.  The general consensus was to treat the lesion at C2 and strongly consider radiation to the lumbosacral spine. Dr Barbaraann Cao and Dr Pamelia Hoit are also in contact with the team regarding systemic recommendations.  I called the patient this AM - spoke with her as well as her husband about the radiation recommendations above.  She reports that she has been having severe pain, getting progressively worse, keeping her up at night.  The pain continues to be in her neck but is the worst in the sacral region radiating down the backs of her legs.  She has not been taking the dexamethasone as recommended by Dr. Pamelia Hoit due to concerns about potential side effects.  We spoke about my recommendation that she begin the dexamethasone for her pain.  She was encouraged by me today to take 4 mg 3 times a day but she feels more comfortable with taking 4 mg twice a day to avoid side effects with dexamethasone and will consider doing so.     She and her husband feel strongly about seeking a second opinion at [facility].  They are going to make some phone calls today.  They are unsure about whether they want to proceed with radiation here but they asked me to plan her radiation today and be ready to start it tomorrow just in case she decides to do so today.  I told them that I will go ahead and do this to keep her options open. They stated they will call later today to let us  know there ultimate decision after making some phone calls and talking with each other.    -----------------------------------  Lonie Peak, MD

## 2023-01-11 NOTE — Progress Notes (Signed)
I spoke with Ms. Bazaldua again today and she has decided to proceed with radiation.  We are ready to start her radiation tomorrow, anticipating treatment to her spine at the levels of C2 and L2-S5.  She does have nausea medication on hand in case she develops some nausea related to systemic therapy and/or radiation therapy.  She has started her dexamethasone.  She expressed appreciation for all the care and advice that she has received from the team thus far. -----------------------------------  Lonie Peak, MD

## 2023-01-11 NOTE — Progress Notes (Signed)
I connected with Tara White on 01/11/23 at  2:30 PM EDT by telephone visit and verified that I am speaking with the correct person using two identifiers.  I discussed the limitations, risks, security and privacy concerns of performing an evaluation and management service by telemedicine and the availability of in-person appointments. I also discussed with the patient that there may be a patient responsible charge related to this service. The patient expressed understanding and agreed to proceed.   Other persons participating in the visit and their role in the encounter:  spouse  Patient's location:  Home Provider's location:  Office Chief Complaint:  Leptomeningeal disease  History of Present Ilness: Tara White reports no clinical changes today.  Continues to have pain mainly in right leg, numbness on left foot and left arm.   Observations: Language and cognition at baseline  Assessment and Plan: Leptomeningeal disease  Case was discussed at length with [facility] team and radiation oncology.  Recommended proceeding with Enhertu for systemic therapy and pairing that with focal radiation to cervical and lumbosacral regions.  Likely to utilize conventional external beam RT rather than SRS.    She is still considering pursuing integrative medicine approach; we recommended pairing this with conventional medical interventions above for best chance at life prolonging outcome.  Follow Up Instructions: Will discuss goals of care again early next week via phone.  I discussed the assessment and treatment plan with the patient.  The patient was provided an opportunity to ask questions and all were answered.  The patient agreed with the plan and demonstrated understanding of the instructions.    The patient was advised to call back or seek an in-person evaluation if the symptoms worsen or if the condition fails to improve as anticipated.    Henreitta Leber, MD   I provided 22 minutes of  non face-to-face telephone visit time during this encounter, and > 50% was spent counseling as documented under my assessment & plan.

## 2023-01-12 ENCOUNTER — Other Ambulatory Visit: Payer: Self-pay | Admitting: Hematology and Oncology

## 2023-01-12 ENCOUNTER — Other Ambulatory Visit: Payer: Self-pay | Admitting: Pharmacist

## 2023-01-12 ENCOUNTER — Ambulatory Visit
Admission: RE | Admit: 2023-01-12 | Discharge: 2023-01-12 | Disposition: A | Discharge status: Home / Self Care | Payer: Commercial Managed Care - PPO | Source: Ambulatory Visit | Attending: Radiation Oncology | Admitting: Radiation Oncology

## 2023-01-12 ENCOUNTER — Other Ambulatory Visit: Payer: Self-pay

## 2023-01-12 ENCOUNTER — Ambulatory Visit: Payer: Commercial Managed Care - PPO

## 2023-01-12 ENCOUNTER — Inpatient Hospital Stay: Payer: Commercial Managed Care - PPO

## 2023-01-12 ENCOUNTER — Encounter: Payer: Self-pay | Admitting: Hematology and Oncology

## 2023-01-12 DIAGNOSIS — Z510 Encounter for antineoplastic radiation therapy: Secondary | ICD-10-CM | POA: Diagnosis not present

## 2023-01-12 DIAGNOSIS — Z171 Estrogen receptor negative status [ER-]: Secondary | ICD-10-CM

## 2023-01-12 LAB — RAD ONC ARIA SESSION SUMMARY

## 2023-01-12 MED ORDER — PROCHLORPERAZINE MALEATE 10 MG PO TABS: 10.0000 mg | ORAL_TABLET | Freq: Four times a day (QID) | ORAL | 1 refills | Status: DC | PRN

## 2023-01-12 MED ORDER — ONDANSETRON HCL 8 MG PO TABS: 8.0000 mg | ORAL_TABLET | Freq: Three times a day (TID) | ORAL | 1 refills | Status: DC | PRN

## 2023-01-12 MED ORDER — LIDOCAINE-PRILOCAINE 2.5-2.5 % EX CREA: TOPICAL_CREAM | CUTANEOUS | 3 refills | Status: DC

## 2023-01-12 NOTE — Progress Notes (Signed)
  Patient not seen. Patient refused enhertu while awaiting some alternative therapies hence orders signed by Dr Pamelia Hoit were cancelled. She then had a change of mind and wanted to start everything locally. Per discussion with Dr Basilio Cairo, primary team is worried that she has large tumor burden and they are concerned about disease progression at sites that are not included in radiation field if we dont initiate enhertu right away and hence they recommend concurrent treatment with enhertu while undergoing radiation. Since all her appts were cancelled at patient's request, scheduling team are once again working diligently to get this scheduled. There may increased risk of myelosuppression with concurrent enhertu and spinal radiation, but given the benefits overweigh risks in this situation, this is reasonable approach. I will sign the first cycle, pt will continue to follow up with Dr Pamelia Hoit and Mardella Layman while he is out of office.  Tara White

## 2023-01-13 ENCOUNTER — Other Ambulatory Visit: Payer: Self-pay | Admitting: Nurse Practitioner

## 2023-01-13 ENCOUNTER — Other Ambulatory Visit: Payer: Self-pay

## 2023-01-13 ENCOUNTER — Ambulatory Visit
Admission: RE | Admit: 2023-01-13 | Discharge: 2023-01-13 | Disposition: A | Discharge status: Home / Self Care | Payer: Commercial Managed Care - PPO | Source: Ambulatory Visit | Attending: Radiation Oncology | Admitting: Radiation Oncology

## 2023-01-13 DIAGNOSIS — Z515 Encounter for palliative care: Secondary | ICD-10-CM

## 2023-01-13 DIAGNOSIS — G893 Neoplasm related pain (acute) (chronic): Secondary | ICD-10-CM

## 2023-01-13 DIAGNOSIS — Z510 Encounter for antineoplastic radiation therapy: Secondary | ICD-10-CM | POA: Diagnosis not present

## 2023-01-13 DIAGNOSIS — C50411 Malignant neoplasm of upper-outer quadrant of right female breast: Secondary | ICD-10-CM

## 2023-01-13 DIAGNOSIS — C7931 Secondary malignant neoplasm of brain: Secondary | ICD-10-CM

## 2023-01-13 DIAGNOSIS — C720 Malignant neoplasm of spinal cord: Secondary | ICD-10-CM

## 2023-01-13 LAB — RAD ONC ARIA SESSION SUMMARY

## 2023-01-13 MED ORDER — DULOXETINE HCL 30 MG PO CPEP
30.0000 mg | ORAL_CAPSULE | Freq: Every day | ORAL | 0 refills | Status: DC
Start: 2023-01-13 — End: 2023-04-06

## 2023-01-13 MED FILL — Fosaprepitant Dimeglumine For IV Infusion 150 MG (Base Eq): INTRAVENOUS | Qty: 5 | Status: AC

## 2023-01-14 ENCOUNTER — Inpatient Hospital Stay: Payer: Commercial Managed Care - PPO

## 2023-01-14 ENCOUNTER — Other Ambulatory Visit: Payer: Self-pay

## 2023-01-14 ENCOUNTER — Ambulatory Visit
Admission: RE | Admit: 2023-01-14 | Discharge: 2023-01-14 | Disposition: A | Discharge status: Home / Self Care | Payer: Commercial Managed Care - PPO | Source: Ambulatory Visit | Attending: Radiation Oncology | Admitting: Radiation Oncology

## 2023-01-14 VITALS — BP 130/61 | HR 72 | Temp 98.0°F | Resp 16 | Ht 69.0 in | Wt 159.0 lb

## 2023-01-14 DIAGNOSIS — Z95828 Presence of other vascular implants and grafts: Secondary | ICD-10-CM

## 2023-01-14 DIAGNOSIS — Z171 Estrogen receptor negative status [ER-]: Secondary | ICD-10-CM

## 2023-01-14 DIAGNOSIS — Z510 Encounter for antineoplastic radiation therapy: Secondary | ICD-10-CM | POA: Diagnosis not present

## 2023-01-14 LAB — RAD ONC ARIA SESSION SUMMARY

## 2023-01-14 LAB — CMP (CANCER CENTER ONLY)
ALT: 17 U/L (ref 0–44)
AST: 18 U/L (ref 15–41)
Albumin: 4.5 g/dL (ref 3.5–5.0)
Alkaline Phosphatase: 50 U/L (ref 38–126)
Anion gap: 6 (ref 5–15)
BUN: 27 mg/dL — ABNORMAL HIGH (ref 6–20)
CO2: 27 mmol/L (ref 22–32)
Calcium: 9.6 mg/dL (ref 8.9–10.3)
Chloride: 106 mmol/L (ref 98–111)
Creatinine: 0.73 mg/dL (ref 0.44–1.00)
GFR, Estimated: >60 mL/min (ref >60)
Glucose, Bld: 109 mg/dL — ABNORMAL HIGH (ref 70–99)
Potassium: 4.1 mmol/L (ref 3.5–5.1)
Sodium: 139 mmol/L (ref 135–145)
Total Bilirubin: 0.7 mg/dL (ref 0.3–1.2)
Total Protein: 7 g/dL (ref 6.5–8.1)

## 2023-01-14 LAB — CBC WITH DIFFERENTIAL (CANCER CENTER ONLY)
Abs Immature Granulocytes: 0.06 10*3/uL (ref 0.00–0.07)
Basophils Absolute: 0 10*3/uL (ref 0.0–0.1)
Basophils Relative: 0 %
Eosinophils Absolute: 0 10*3/uL (ref 0.0–0.5)
Eosinophils Relative: 0 %
HCT: 35.6 % — ABNORMAL LOW (ref 36.0–46.0)
Hemoglobin: 12.2 g/dL (ref 12.0–15.0)
Immature Granulocytes: 1 %
Lymphocytes Relative: 5 %
Lymphs Abs: 0.5 10*3/uL — ABNORMAL LOW (ref 0.7–4.0)
MCH: 32.9 pg (ref 26.0–34.0)
MCHC: 34.3 g/dL (ref 30.0–36.0)
MCV: 96 fL (ref 80.0–100.0)
Monocytes Absolute: 0.3 10*3/uL (ref 0.1–1.0)
Monocytes Relative: 3 %
Neutro Abs: 8.6 10*3/uL — ABNORMAL HIGH (ref 1.7–7.7)
Neutrophils Relative %: 91 %
Platelet Count: 242 10*3/uL (ref 150–400)
RBC: 3.71 MIL/uL — ABNORMAL LOW (ref 3.87–5.11)
RDW: 12 % (ref 11.5–15.5)
WBC Count: 9.5 10*3/uL (ref 4.0–10.5)
nRBC: 0 % (ref 0.0–0.2)

## 2023-01-14 LAB — PREGNANCY, URINE: Preg Test, Ur: NEGATIVE

## 2023-01-14 MED ORDER — PALONOSETRON HCL INJECTION 0.25 MG/5ML
0.2500 mg | Freq: Once | INTRAVENOUS | Status: AC
  Administered 2023-01-14: 0.25 mg via INTRAVENOUS
  Filled 2023-01-14: qty 5

## 2023-01-14 MED ORDER — SODIUM CHLORIDE 0.9% FLUSH
10.0000 mL | Freq: Once | INTRAVENOUS | Status: AC
  Administered 2023-01-14: 10 mL

## 2023-01-14 MED ORDER — SODIUM CHLORIDE 0.9% FLUSH
10.0000 mL | INTRAVENOUS | Status: DC | PRN
  Administered 2023-01-14: 10 mL

## 2023-01-14 MED ORDER — FAM-TRASTUZUMAB DERUXTECAN-NXKI CHEMO 100 MG IV SOLR
5.4000 mg/kg | Freq: Once | INTRAVENOUS | Status: AC
  Administered 2023-01-14: 400 mg via INTRAVENOUS
  Filled 2023-01-14: qty 20

## 2023-01-14 MED ORDER — ACETAMINOPHEN 325 MG PO TABS
650.0000 mg | ORAL_TABLET | Freq: Once | ORAL | Status: AC
  Administered 2023-01-14: 650 mg via ORAL
  Filled 2023-01-14: qty 2

## 2023-01-14 MED ORDER — DIPHENHYDRAMINE HCL 25 MG PO CAPS
25.0000 mg | ORAL_CAPSULE | Freq: Once | ORAL | Status: AC
  Administered 2023-01-14: 25 mg via ORAL

## 2023-01-14 MED ORDER — DEXAMETHASONE SODIUM PHOSPHATE 10 MG/ML IJ SOLN
10.0000 mg | Freq: Once | INTRAMUSCULAR | Status: AC
  Administered 2023-01-14: 10 mg via INTRAVENOUS
  Filled 2023-01-14: qty 1

## 2023-01-14 MED ORDER — DIPHENHYDRAMINE HCL 25 MG PO CAPS
50.0000 mg | ORAL_CAPSULE | Freq: Once | ORAL | Status: DC
  Filled 2023-01-14: qty 2

## 2023-01-14 MED ORDER — DEXTROSE 5 % IV SOLN: INTRAVENOUS | Status: DC

## 2023-01-14 MED ORDER — SODIUM CHLORIDE 0.9 % IV SOLN
150.0000 mg | Freq: Once | INTRAVENOUS | Status: AC
  Administered 2023-01-14: 150 mg via INTRAVENOUS
  Filled 2023-01-14: qty 150

## 2023-01-14 MED ORDER — HEPARIN SOD (PORK) LOCK FLUSH 100 UNIT/ML IV SOLN
500.0000 [IU] | Freq: Once | INTRAVENOUS | Status: AC | PRN
  Administered 2023-01-14: 500 [IU]

## 2023-01-14 NOTE — Progress Notes (Signed)
Per Dr. Al Pimple - okay to proceed with pre-medications while awaiting urine pregnancy results.

## 2023-01-14 NOTE — Patient Instructions (Signed)
[facility]   Discharge Instructions: Thank you for choosing [facility] to provide your oncology and hematology care.   If you have a lab appointment with the Cancer Center, please go directly to the Cancer Center and check in at the registration area.   Wear comfortable clothing and clothing appropriate for easy access to any Portacath or PICC line.   We strive to give you quality time with your provider. You may need to reschedule your appointment if you arrive late (15 or more minutes).  Arriving late affects you and other patients whose appointments are after yours.  Also, if you miss three or more appointments without notifying the office, you may be dismissed from the clinic at the provider's discretion.      For prescription refill requests, have your pharmacy contact our office and allow 72 hours for refills to be completed.    Today you received the following chemotherapy and/or immunotherapy agents: Fam-trastuzumab deruxtecan (Enhertu)       To help prevent nausea and vomiting after your treatment, we encourage you to take your nausea medication as directed.  BELOW ARE SYMPTOMS THAT SHOULD BE REPORTED IMMEDIATELY: *FEVER GREATER THAN 100.4 F (38 C) OR HIGHER *CHILLS OR SWEATING *NAUSEA AND VOMITING THAT IS NOT CONTROLLED WITH YOUR NAUSEA MEDICATION *UNUSUAL SHORTNESS OF BREATH *UNUSUAL BRUISING OR BLEEDING *URINARY PROBLEMS (pain or burning when urinating, or frequent urination) *BOWEL PROBLEMS (unusual diarrhea, constipation, pain near the anus) TENDERNESS IN MOUTH AND THROAT WITH OR WITHOUT PRESENCE OF ULCERS (sore throat, sores in mouth, or a toothache) UNUSUAL RASH, SWELLING OR PAIN  UNUSUAL VAGINAL DISCHARGE OR ITCHING   Items with * indicate a potential emergency and should be followed up as soon as possible or go to the Emergency Department if any problems should occur.  Please show the CHEMOTHERAPY ALERT CARD or  IMMUNOTHERAPY ALERT CARD at check-in to the Emergency Department and triage nurse.  Should you have questions after your visit or need to cancel or reschedule your appointment, please contact [facility]  Dept: [phone]  and follow the prompts.  Office hours are 8:00 a.m. to 4:30 p.m. Monday - Friday. Please note that voicemails left after 4:00 p.m. may not be returned until the following business day.  We are closed weekends and major holidays. You have access to a nurse at all times for urgent questions. Please call the main number to the clinic Dept: [phone] and follow the prompts.   For any non-urgent questions, you may also contact your provider using MyChart. We now offer e-Visits for anyone 75 and older to request care online for non-urgent symptoms. For details visit [email or web address].   Also download the MyChart app! Go to the app store, search "MyChart", open the app, select [facility], and log in with your MyChart username and password.

## 2023-01-16 ENCOUNTER — Encounter: Payer: Self-pay | Admitting: Hematology and Oncology

## 2023-01-16 ENCOUNTER — Encounter: Payer: Self-pay | Admitting: Internal Medicine

## 2023-01-17 ENCOUNTER — Other Ambulatory Visit: Payer: Self-pay

## 2023-01-17 ENCOUNTER — Encounter: Payer: Self-pay | Admitting: Hematology and Oncology

## 2023-01-17 ENCOUNTER — Other Ambulatory Visit: Payer: Self-pay | Admitting: Radiation Oncology

## 2023-01-17 ENCOUNTER — Emergency Department (HOSPITAL_COMMUNITY)
Admission: EM | Admit: 2023-01-17 | Discharge: 2023-01-18 | Disposition: A | Discharge status: Home / Self Care | Payer: Commercial Managed Care - PPO | Attending: Student | Admitting: Student

## 2023-01-17 ENCOUNTER — Ambulatory Visit
Admission: RE | Admit: 2023-01-17 | Discharge: 2023-01-17 | Disposition: A | Discharge status: Home / Self Care | Payer: Commercial Managed Care - PPO | Source: Ambulatory Visit | Attending: Radiation Oncology

## 2023-01-17 ENCOUNTER — Inpatient Hospital Stay: Payer: Commercial Managed Care - PPO | Admitting: Internal Medicine

## 2023-01-17 ENCOUNTER — Encounter (HOSPITAL_COMMUNITY): Payer: Self-pay

## 2023-01-17 ENCOUNTER — Emergency Department (HOSPITAL_COMMUNITY): Payer: Commercial Managed Care - PPO

## 2023-01-17 ENCOUNTER — Telehealth: Payer: Self-pay

## 2023-01-17 DIAGNOSIS — R791 Abnormal coagulation profile: Secondary | ICD-10-CM | POA: Diagnosis not present

## 2023-01-17 DIAGNOSIS — G902 Horner's syndrome: Secondary | ICD-10-CM | POA: Diagnosis not present

## 2023-01-17 DIAGNOSIS — Z853 Personal history of malignant neoplasm of breast: Secondary | ICD-10-CM | POA: Insufficient documentation

## 2023-01-17 DIAGNOSIS — G96198 Other disorders of meninges, not elsewhere classified: Secondary | ICD-10-CM

## 2023-01-17 DIAGNOSIS — Z85841 Personal history of malignant neoplasm of brain: Secondary | ICD-10-CM | POA: Diagnosis not present

## 2023-01-17 DIAGNOSIS — R202 Paresthesia of skin: Secondary | ICD-10-CM | POA: Diagnosis not present

## 2023-01-17 DIAGNOSIS — C720 Malignant neoplasm of spinal cord: Secondary | ICD-10-CM

## 2023-01-17 DIAGNOSIS — R29810 Facial weakness: Secondary | ICD-10-CM | POA: Diagnosis present

## 2023-01-17 DIAGNOSIS — C50919 Malignant neoplasm of unspecified site of unspecified female breast: Secondary | ICD-10-CM

## 2023-01-17 DIAGNOSIS — H5702 Anisocoria: Secondary | ICD-10-CM | POA: Diagnosis not present

## 2023-01-17 DIAGNOSIS — Z85830 Personal history of malignant neoplasm of bone: Secondary | ICD-10-CM | POA: Diagnosis not present

## 2023-01-17 LAB — RAD ONC ARIA SESSION SUMMARY

## 2023-01-17 LAB — BASIC METABOLIC PANEL
Anion gap: 8 (ref 5–15)
BUN: 25 mg/dL — ABNORMAL HIGH (ref 6–20)
CO2: 25 mmol/L (ref 22–32)
Calcium: 8.9 mg/dL (ref 8.9–10.3)
Chloride: 101 mmol/L (ref 98–111)
Creatinine, Ser: 0.74 mg/dL (ref 0.44–1.00)
GFR, Estimated: >60 mL/min (ref >60)
Glucose, Bld: 127 mg/dL — ABNORMAL HIGH (ref 70–99)
Potassium: 3.7 mmol/L (ref 3.5–5.1)
Sodium: 134 mmol/L — ABNORMAL LOW (ref 135–145)

## 2023-01-17 LAB — CBC WITH DIFFERENTIAL/PLATELET
Abs Immature Granulocytes: 0.03 10*3/uL (ref 0.00–0.07)
Basophils Absolute: 0 10*3/uL (ref 0.0–0.1)
Basophils Relative: 0 %
Eosinophils Absolute: 0 10*3/uL (ref 0.0–0.5)
Eosinophils Relative: 0 %
HCT: 31.8 % — ABNORMAL LOW (ref 36.0–46.0)
Hemoglobin: 11.1 g/dL — ABNORMAL LOW (ref 12.0–15.0)
Immature Granulocytes: 1 %
Lymphocytes Relative: 6 %
Lymphs Abs: 0.3 10*3/uL — ABNORMAL LOW (ref 0.7–4.0)
MCH: 33.4 pg (ref 26.0–34.0)
MCHC: 34.9 g/dL (ref 30.0–36.0)
MCV: 95.8 fL (ref 80.0–100.0)
Monocytes Absolute: 0.1 10*3/uL (ref 0.1–1.0)
Monocytes Relative: 2 %
Neutro Abs: 4.9 10*3/uL (ref 1.7–7.7)
Neutrophils Relative %: 91 %
Platelets: 184 10*3/uL (ref 150–400)
RBC: 3.32 MIL/uL — ABNORMAL LOW (ref 3.87–5.11)
RDW: 11.5 % (ref 11.5–15.5)
WBC: 5.4 10*3/uL (ref 4.0–10.5)
nRBC: 0 % (ref 0.0–0.2)

## 2023-01-17 LAB — PROTIME-INR
INR: 1 (ref 0.8–1.2)
Prothrombin Time: 13.4 s (ref 11.4–15.2)

## 2023-01-17 LAB — PREGNANCY, URINE: Preg Test, Ur: NEGATIVE

## 2023-01-17 LAB — APTT: aPTT: 28 s (ref 24–36)

## 2023-01-17 MED ORDER — GADOBUTROL 1 MMOL/ML IV SOLN
7.0000 mL | Freq: Once | INTRAVENOUS | Status: AC | PRN
Start: 2023-01-17 — End: 2023-01-17
  Administered 2023-01-17: 7 mL via INTRAVENOUS

## 2023-01-17 MED ORDER — SONAFINE EX EMUL
1.0000 | Freq: Once | CUTANEOUS | Status: AC
  Administered 2023-01-17: 1 via TOPICAL

## 2023-01-17 NOTE — ED Provider Notes (Signed)
[place] EMERGENCY DEPARTMENT AT [facility] Provider Note  CSN: [id number] Arrival date & time: 01/17/23 1741  Chief Complaint(s) Facial Droop  HPI Tara White is a 32 y.o. 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  Patient states that she believes her symptoms began on 01/11/2023 and is concerned that she might be having a stroke.  Received radiation for known brain metastases yesterday and is currently on chemo.  Patient arrives with miosis and facial droop on the left.  Denies numbness, tingling, weakness of the upper or lower extremities.  Denies chest pain, shortness of breath, abdominal pain, nausea, vomiting or other systemic symptoms.   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 Authorizing Provider  NALTREXONE HCL PO Take 3 mg by mouth daily. 12/17/22  Yes [provider]  nitrofurantoin, macrocrystal-monohydrate, (MACROBID) 100 MG capsule Take 100 mg by mouth every 12 (twelve) hours. 12/22/22  Yes [provider]  ascorbic acid (VITAMIN C) 1000 MG tablet Take 1,000 mg by mouth daily.    [provider]  carboxymethylcellulose  (REFRESH TEARS) 0.5 % SOLN Place 1 drop into both eyes 3 (three) times daily as needed (Dry eye).    [provider]  Cholecalciferol (VITAMIN D) 125 MCG (5000 UT) CAPS Take 5,000 Int'l Units/L by mouth daily.    [provider]  Cholecalciferol (VITAMIN D-3) 5000 UNIT/ML LIQD Take by mouth.    [provider]  cimetidine (TAGAMET) 400 MG tablet Take 400 mg by mouth 2 (two) times daily. 11/30/22   [provider]  dexamethasone (DECADRON) 4 MG tablet Take 1 tablet (4 mg total) by mouth 2 (two) times daily with a meal. 09/21/22   Erven Colla, PA-C  diphenhydrAMINE (BENADRYL) 25 mg capsule Take 25 mg by mouth at bedtime as needed.    [provider]  dipyridamole (PERSANTINE) 75 MG tablet Take 75 mg by mouth 2 (two) times daily. 11/30/22   [provider]  DULoxetine (CYMBALTA) 30 MG capsule Take 1 capsule (30 mg total) by mouth daily. 01/13/23   Pickenpack-Cousar, Arty Baumgartner, NP  etodolac (LODINE) 300 MG capsule Take 300 mg by mouth 2 (two) times daily. 11/30/22   [provider]  gabapentin (NEURONTIN) 300 MG capsule Take 2 capsules (600 mg) twice daily and 3 capsules (900 mg) at bedtime. 01/11/23   Pickenpack-Cousar, Arty Baumgartner, NP  Hydroxychloroquine Sulfate 100 MG TABS Take 1 tablet by mouth 2 (two) times daily. 11/30/22   [provider]  lidocaine-prilocaine (EMLA) cream Apply to affected area once 01/13/23   Rachel Moulds, MD  lovastatin (MEVACOR) 20 MG tablet Take 20 mg by mouth at bedtime. 11/30/22   [provider]  magnesium oxide (MAG-OX) 400 MG tablet Take 400 mg by mouth daily.    [provider]  Melatonin 10 MG CAPS Take 10 mg by mouth at bedtime.    [provider]  metFORMIN (GLUCOPHAGE) 500 MG tablet Take 500 mg by mouth 2 (two) times daily. 11/30/22   [provider]  minocycline (MINOCIN) 100 MG capsule Take 100 mg by mouth 2 (two) times daily. 01/05/23   [provider]  NON  FORMULARY Take 3 mg by mouth at bedtime. LOW DOSE NALTROXENE Takes when not taking oxycodone    [provider]  ondansetron (ZOFRAN) 8 MG tablet Take 1 tablet (8 mg total) by mouth every 8 (eight) hours as needed for nausea or vomiting. Start on the third day after chemotherapy. 01/13/23   Rachel Moulds, MD  oxyCODONE-acetaminophen (PERCOCET/ROXICET) 5-325 MG tablet Take 1 tablet by mouth every 8 (eight) hours as needed for severe pain. 12/31/22   Serena Croissant, MD  prochlorperazine (COMPAZINE) 10 MG tablet Take 1 tablet (10 mg total) by mouth every 6 (six) hours as needed for nausea or vomiting. 01/13/23   Rachel Moulds, MD  rOPINIRole (REQUIP) 0.25 MG tablet Take 1 tablet (0.25 mg total) by mouth at bedtime. 01/11/23   Pickenpack-Cousar, Arty Baumgartner, NP  temazepam (RESTORIL) 15 MG capsule Take 1-2 capsules (15-30 mg total) by mouth at bedtime as needed for sleep. 01/10/23   Lonie Peak, MD  thyroid (ARMOUR) 60 MG tablet Take 60 mg by mouth daily before breakfast.    [provider]  Vitamin D, Ergocalciferol, (DRISDOL) 1.25 MG (50000 UNIT) CAPS capsule Take 50,000 Units by mouth every 7 (seven) days.    [provider]                                                                                                                                    Past Surgical History Past Surgical History:  Procedure Laterality Date   BREAST ENHANCEMENT SURGERY Left 06/25/2022   Procedure: MAMMOPLASTY AUGMENTATION, MASTOPEXY (BREAST);  Surgeon: Glenna Fellows, MD;  Location: [facility];  Service: Plastics;  Laterality: Left;   BREAST RECONSTRUCTION WITH PLACEMENT OF TISSUE EXPANDER AND ALLODERM Right 10/13/2021   Procedure: RIGHT BREAST RECONSTRUCTION WITH PLACEMENT OF TISSUE EXPANDER AND ALLODERM;  Surgeon: Glenna Fellows, MD;  Location: [facility];  Service: Plastics;  Laterality: Right;   CESAREAN SECTION N/A 12/10/2015   Procedure: CESAREAN SECTION;  Surgeon: Noland Fordyce, MD;  Location: [facility];  Service: Obstetrics;  Laterality: N/A;   IR CV LINE INJECTION  06/25/2021   IR IMAGING GUIDED PORT INSERTION  12/20/2022   IR RADIOLOGY PERIPHERAL GUIDED IV START  06/25/2021   IR US GUIDE VASC ACCESS LEFT  06/25/2021   IR VENO/EXT/UNI LEFT  06/25/2021   NODE DISSECTION Right 10/13/2021   Procedure: TARGETED LYMPH NODE DISSECTION;  Surgeon: Abigail Miyamoto, MD;  Location: [facility];  Service: General;  Laterality: Right;   PORT-A-CATH REMOVAL Left 05/27/2022   Procedure: REMOVAL PORT-A-CATH;  Surgeon: Abigail Miyamoto, MD;  Location: [facility];  Service: General;  Laterality: Left;   PORTACATH PLACEMENT Left 04/30/2021   Procedure: INSERTION PORT-A-CATH;  Surgeon: Abigail Miyamoto, MD;  Location: [facility];  Service: General;  Laterality: Left;   REMOVAL OF TISSUE EXPANDER AND PLACEMENT OF IMPLANT Right 06/25/2022   Procedure: REMOVAL OF TISSUE EXPANDER AND PLACEMENT OF IMPLANT;  Surgeon: Glenna Fellows, MD;  Location: [facility];  Service: Plastics;  Laterality: Right;   SIMPLE MASTECTOMY WITH AXILLARY SENTINEL NODE BIOPSY Right 10/13/2021   Procedure: RIGHT MASTECTOMY;  Surgeon: Abigail Miyamoto, MD;  Location: [facility];  Service: General;  Laterality: Right;   WISDOM TOOTH EXTRACTION     Family History Family History  Problem Relation Age of Onset   Hypothyroidism Mother    Breast cancer Maternal Grandmother 70   Skin cancer Maternal Grandfather    Heart failure Paternal Grandfather     Social History Social History   Tobacco Use   Smoking status: Never   Smokeless tobacco: Never  Substance Use Topics   Alcohol use: Yes    Alcohol/week: 2.0 standard drinks of alcohol    Types: 2 Standard drinks or equivalent per week    Comment: rarely   Drug use: No   Allergies Patient has no known allergies.  Review of Systems Review of Systems  Eyes:  Positive for visual  disturbance.  Neurological:  Positive for facial asymmetry and numbness.    Physical Exam Vital Signs  I have reviewed the triage vital signs BP 122/80   Pulse (!) 56   Temp 98 F (36.7 C) (Oral)   Resp 17   Ht 5\' 9"  (1.753 m)   Wt 72 kg   SpO2 100%   BMI 23.44 kg/m   Physical Exam Vitals and nursing note reviewed.  Constitutional:      General: She is not in acute distress.    Appearance: She is well-developed.  HENT:     Head: Normocephalic and atraumatic.  Eyes:     Conjunctiva/sclera: Conjunctivae normal.     Comments: Anisocoria with miosis on the left  Cardiovascular:     Rate and Rhythm: Normal rate and regular rhythm.     Heart sounds: No murmur heard. Pulmonary:     Effort: Pulmonary effort is normal. No respiratory distress.     Breath sounds: Normal breath sounds.  Abdominal:     Palpations: Abdomen is soft.     Tenderness: There is no abdominal tenderness.  Musculoskeletal:        General: No swelling.     Cervical back: Neck supple.  Skin:    General: Skin is warm and dry.     Capillary Refill: Capillary refill takes less than 2 seconds.  Neurological:     Mental Status: She is alert.     Cranial Nerves: Cranial nerve deficit present.     Sensory: Sensory deficit present.  Psychiatric:        Mood and Affect: Mood normal.     ED Results and Treatments Labs (all labs ordered are listed, but only abnormal results are displayed) Labs Reviewed  CBC WITH DIFFERENTIAL/PLATELET - Abnormal; Notable for the following components:      Result Value   RBC 3.32 (*)    Hemoglobin 11.1 (*)    HCT 31.8 (*)  Lymphs Abs 0.3 (*)    All other components within normal limits  APTT  PROTIME-INR  PREGNANCY, URINE  BASIC METABOLIC PANEL                                                                                                                          Radiology CT Head Wo Contrast  Result Date: 01/17/2023 CLINICAL DATA:  Monitoring of head and neck  cancer. EXAM: CT HEAD WITHOUT CONTRAST TECHNIQUE: Contiguous axial images were obtained from the base of the skull through the vertex without intravenous contrast. RADIATION DOSE REDUCTION: This exam was performed according to the departmental dose-optimization program which includes automated exposure control, adjustment of the mA and/or kV according to patient size and/or use of iterative reconstruction technique. COMPARISON:  MRI brain 12/30/2022 FINDINGS: Brain: There is a space-occupying lesion in the left frontal white matter measuring about 2.9 cm in diameter. There is adjacent vasogenic edema. This causes mass effect with effacement of the anterior horn of the left lateral ventricle and left-to-right midline shift of about 4 mm. No significant change in appearance in comparison to the previous MRI. Central increased attenuation within the mass may represent hemorrhage or calcification. No additional lesions are identified. No abnormal extra-axial fluid collections. Basal cisterns are not effaced. Vascular: No hyperdense vessel or unexpected calcification. Skull: Normal. Negative for fracture or focal lesion. Sinuses/Orbits: Paranasal sinuses and mastoid air cells are clear. Other: None. IMPRESSION: 1. 2.9 cm diameter space-occupying lesion in the left frontal white matter with associated vasogenic edema and mass-effect as described. Central increased attenuation could indicate calcification or internal hemorrhage. The lesion appears similar to the prior MRI, allowing for differences in modality. 2. No additional lesions are identified. MRI would be more sensitive for evaluation of metastatic intracranial process if clinically indicated. Electronically Signed   By: Burman Nieves M.D.   On: 01/17/2023 19:13    Pertinent labs & imaging results that were available during my care of the patient were reviewed by me and considered in my medical decision making (see MDM for details).  Medications Ordered in  ED Medications  gadobutrol (GADAVIST) 1 MMOL/ML injection 7 mL (has no administration in time range)                                                                                                                                     Procedures Procedures  (  including critical care time)  Medical Decision Making / ED Course   This patient presents to the ED for concern of facial droop, this involves an extensive number of treatment options, and is a complaint that carries with it a high risk of complications and morbidity.  The differential diagnosis includes Horner's syndrome, CVA, Bell's palsy, brain mets with surrounding edema  MDM: Patient seen emergency room for evaluation of a facial droop.  Physical exam with miosis on the left, facial droop with minimal forehead involvement on the left, mild numbness of the left face.  Neurologic exam otherwise unremarkable in the upper or lower extremities.  Laboratory evaluation with a hemoglobin of 11.1 but is otherwise unremarkable.  CT head with a 2.9 cm space-occupying lesion in the left frontal white matter with vasogenic edema and mass effect largely unchanged from previous MRI.  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  Pending MRI at time of signout.  Please see provider signout note for continuation of workup.   Additional history obtained: -Additional history obtained from partner -External records from outside source obtained and reviewed including: Chart review including previous notes, labs, imaging, consultation notes   Lab Tests: -I ordered, reviewed, and interpreted labs.   The pertinent results include:   Labs Reviewed  CBC WITH DIFFERENTIAL/PLATELET - Abnormal; Notable for the following components:      Result Value   RBC 3.32 (*)    Hemoglobin 11.1 (*)    HCT 31.8 (*)    Lymphs Abs 0.3 (*)    All  other components within normal limits  APTT  PROTIME-INR  PREGNANCY, URINE  BASIC METABOLIC PANEL      Imaging Studies ordered: I ordered imaging studies including CT head I independently visualized and interpreted imaging. I agree with the radiologist interpretation  MRI brain and C-spine pending   Medicines ordered and prescription drug management: Meds ordered this encounter  Medications   gadobutrol (GADAVIST) 1 MMOL/ML injection 7 mL    -I have reviewed the patients home medicines and have made adjustments as needed  Critical interventions none    Cardiac Monitoring: The patient was maintained on a cardiac monitor.  I personally viewed and interpreted the cardiac monitored which showed an underlying rhythm of: NSR  Social Determinants of Health:  Factors impacting patients care include: none   Reevaluation: After the interventions noted above, I reevaluated the patient and found that they have :stayed the same  Co morbidities that complicate the patient evaluation  Past Medical History:  Diagnosis Date   DVT (deep venous thrombosis) (HCC) 06/25/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   Family history of breast cancer 04/21/2021   right breast ca 03/2021      Dispostion: I considered admission for this patient, and disposition pending completion of imaging studies.  Please see provider signout for continuation of workup     Final Clinical Impression(s) / ED Diagnoses Final diagnoses:  None     @PCDICTATION @    Glendora Score, MD 01/17/23 2219

## 2023-01-17 NOTE — ED Triage Notes (Addendum)
Patient stated she has left sided facial droop, right eye droopy, jaw weakness, left pupil is larger than the right, blurred vision. These symptoms began October 15. Has stage 4 breast cancer, doing chemo and radiation.

## 2023-01-17 NOTE — ED Notes (Signed)
Wants her port accessed

## 2023-01-17 NOTE — ED Provider Notes (Incomplete)
  Physical Exam  BP 122/80   Pulse (!) 56   Temp 98 F (36.7 C) (Oral)   Resp 17   Ht 5\' 9"  (1.753 m)   Wt 72 kg   SpO2 100%   BMI 23.44 kg/m     Procedures  Procedures  ED Course / MDM    Medical Decision Making Amount and/or Complexity of Data Reviewed Radiology: ordered.  Risk Prescription drug management.    75F with hx of breast cancer, mets to the brain and spine, presenting with Horner's syndrome. Currently on chemotherapy. Pending MRI Brain and C spine. Dispo pending imaging results and reassessment.  Neuro: ***

## 2023-01-17 NOTE — Telephone Encounter (Signed)
Called pt per her MyChart message and advised she go to the ED as these are signs of stroke. Blurred vision and pupilary defect.  Called report to [facility], ED charge. Pt is in agreement with ED referral

## 2023-01-17 NOTE — ED Provider Triage Note (Signed)
Emergency Medicine Provider Triage Evaluation Note  Tara White , a 32 y.o. female  was evaluated in triage.  Pt complains of face droop.  Review of Systems  Positive:  Negative:   Physical Exam  BP 136/74 (BP Location: Left Arm)   Pulse 79   Temp 98 F (36.7 C) (Oral)   Resp 16   Ht 5\' 9"  (1.753 m)   Wt 72 kg   SpO2 99%   BMI 23.44 kg/m  Gen:   Awake, no distress   Resp:  Normal effort  MSK:   Moves extremities without difficulty  Other:    Medical Decision Making  Medically screening exam initiated at 6:21 PM.  Appropriate orders placed.  Tara White was informed that the remainder of the evaluation will be completed by another provider, this initial triage assessment does not replace that evaluation, and the importance of remaining in the ED until their evaluation is complete.  Hx of brain tumor - follows with providers here in ED. Has had right eye droop, left mouth droop, dilated left pupil x1 week. Called neuro provider who referred her to ED. Rest of neuro exam unremarkable. No other infectious complaints today.    Dorthy Cooler, [place] 01/17/23 [phone]

## 2023-01-17 NOTE — ED Notes (Signed)
Patient transported to MRI 

## 2023-01-17 NOTE — ED Notes (Signed)
Pt returned from MRI °

## 2023-01-18 ENCOUNTER — Encounter: Payer: Self-pay | Admitting: Radiation Oncology

## 2023-01-18 ENCOUNTER — Ambulatory Visit
Admission: RE | Admit: 2023-01-18 | Discharge: 2023-01-18 | Disposition: A | Discharge status: Home / Self Care | Payer: Commercial Managed Care - PPO | Source: Ambulatory Visit | Attending: Radiation Oncology | Admitting: Radiation Oncology

## 2023-01-18 ENCOUNTER — Telehealth: Payer: Self-pay

## 2023-01-18 ENCOUNTER — Ambulatory Visit: Payer: Commercial Managed Care - PPO

## 2023-01-18 ENCOUNTER — Telehealth: Payer: Self-pay | Admitting: *Deleted

## 2023-01-18 ENCOUNTER — Other Ambulatory Visit: Payer: Self-pay

## 2023-01-18 ENCOUNTER — Inpatient Hospital Stay (HOSPITAL_BASED_OUTPATIENT_CLINIC_OR_DEPARTMENT_OTHER): Payer: Commercial Managed Care - PPO | Admitting: Internal Medicine

## 2023-01-18 VITALS — BP 134/82 | HR 59 | Temp 97.5°F | Resp 20 | Wt 153.3 lb

## 2023-01-18 DIAGNOSIS — C7931 Secondary malignant neoplasm of brain: Secondary | ICD-10-CM | POA: Diagnosis not present

## 2023-01-18 DIAGNOSIS — G96198 Other disorders of meninges, not elsewhere classified: Secondary | ICD-10-CM

## 2023-01-18 DIAGNOSIS — Z510 Encounter for antineoplastic radiation therapy: Secondary | ICD-10-CM | POA: Diagnosis not present

## 2023-01-18 LAB — RAD ONC ARIA SESSION SUMMARY

## 2023-01-18 MED ORDER — DEXAMETHASONE 4 MG PO TABS
8.0000 mg | ORAL_TABLET | Freq: Once | ORAL | Status: DC
  Filled 2023-01-18: qty 2

## 2023-01-18 MED ORDER — HEPARIN SOD (PORK) LOCK FLUSH 100 UNIT/ML IV SOLN
INTRAVENOUS | Status: AC
  Administered 2023-01-18: 500 [IU]
  Filled 2023-01-18: qty 5

## 2023-01-18 MED ORDER — HEPARIN SOD (PORK) LOCK FLUSH 100 UNIT/ML IV SOLN: 500.0000 [IU] | Freq: Once | INTRAVENOUS | Status: DC

## 2023-01-18 NOTE — Telephone Encounter (Signed)
RN called pt to inquire about when pt would like to meet with Dr. Basilio Cairo today. Dr. Basilio Cairo would like to meet with pt about adding additional treatment based on new findings. Rn left called back details for message with pt.

## 2023-01-18 NOTE — Progress Notes (Signed)
I met with the patient and her husband today after her spinal radiation treatment.  She had met earlier today with Dr. Barbaraann Cao and Dr. Barbaraann Cao and I have reviewed her MRI of her brain from an emergency room admission last night.  He shared our consensus is that given the recent results to her brain MRI and her new neurologic symptoms (anisocoria, left facial droop) , whole brain radiation therapy is in her best interest.  We have verified with medical oncology that it is okay for her to continue Enhertu while she receives whole brain radiation and she and her husband very much want to start this as soon as possible to preserve her neurologic function.  I reviewed a consent form with the patient and discussed the risks benefits and side effects of whole brain radiation which may include but not necessarily be limited to skin irritation, hair loss, fatigue, headache, watering of the eyes, cognitive decline, radiation necrosis to the brain.  Consent form was signed and placed in her chart and I will start working on a new plan urgently that  will allow her to begin treatment to her whole brain tomorrow.  We also discussed that given her new symptoms it is reasonable to stay on Decadron 4 mg twice a day rather than start tapering it as we had planned yesterday.  She is in agreement with this.  Of note she has no thrush on exam today. -----------------------------------  Lonie Peak, MD

## 2023-01-18 NOTE — Progress Notes (Signed)
[facility] [address]  [place], [place] [address] [phone]   Interval Evaluation  Date of Service: 01/18/23 Patient Name: Tara White Patient MRN: [id number] Patient DOB: 30-Jan-1991 Provider: Henreitta Leber, MD  Identifying Statement:  Tara White is a 32 y.o. 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  Ultrasound revealed 3 cm mass at 10:00 plus small satellite lesions.  Biopsy revealed grade 3 IDC ER 0%, PR 0%, HER2 3+ positive, Ki-67 30%   04/08/2021 Cancer Staging   Staging form: Breast, AJCC 8th Edition - Clinical stage from 04/08/2021: Stage IIB (cT2, cN1, cM0, G3, ER-, PR-, HER2+) - Signed by Serena Croissant, MD on 04/15/2021 Stage prefix: Initial diagnosis Method of lymph node assessment: Axillary lymph node dissection Histologic grading system: 3 grade system   05/01/2021 - 11/26/2021 Chemotherapy   Patient is on Treatment Plan : BREAST  Docetaxel + Carboplatin + Trastuzumab + Pertuzumab  (TCHP) q21d      05/21/2021 Genetic Testing   Negative hereditary cancer genetic testing: no pathogenic variants detected in [facility] CustomNext-Cancer +RNAinsight Panel.  Report date is 05/21/2021.   The CustomNext-Cancer+RNAinsight panel offered by [facility] includes sequencing and rearrangement analysis for the following 47 genes:  APC, ATM, AXIN2, BARD1, BMPR1A, BRCA1, BRCA2, BRIP1, CDH1, CDK4, CDKN2A, CHEK2, DICER1, EPCAM, GREM1, HOXB13, MEN1, MLH1, MSH2, MSH3, MSH6, MUTYH, NBN, NF1, NF2, NTHL1, PALB2, PMS2, POLD1, POLE, PTEN,  RAD51C, RAD51D, RECQL, RET, SDHA, SDHAF2, SDHB, SDHC, SDHD, SMAD4, SMARCA4, STK11, TP53, TSC1, TSC2, and VHL.  RNA data is routinely analyzed for use in variant interpretation for all genes.   09/04/2021 - 04/21/2022 Chemotherapy   Patient is on Treatment Plan : BREAST Trastuzumab  + Pertuzumab q21d x 13 cycles     10/13/2021 Surgery   right mastectomy with reconstruction: No residual malignancy complete pathologic response 0/5 lymph nodes negative    12/03/2021 - 01/11/2022 Radiation Therapy   Site Technique Total Dose (Gy) Dose per Fx (Gy) Completed Fx Beam Energies  Chest Wall, Right: CW_R_IMN 3D 50.4/50.4 1.8 28/28 6XFFF  Chest Wall, Right: CW_R_PAB_SCV 3D 50.4/50.4 1.8 28/28 6X, 10X     12/31/2022 - 12/31/2022 Chemotherapy   Patient is on Treatment Plan : BREAST Trastuzumab + Pertuzumab q21d     01/11/2023 - 01/11/2023 Chemotherapy   Patient is on Treatment Plan : BREAST METASTATIC Fam-Trastuzumab Deruxtecan-nxki (Enhertu) (5.4) q21d     01/14/2023 -  Chemotherapy   Patient is on Treatment Plan : BREAST METASTATIC Fam-Trastuzumab Deruxtecan-nxki (Enhertu) (5.4) q21d      CNS Oncologic History 10/15/22: Completes fractionated SRS to left caudate head metastasis 01/13/23: Begins multifocal spine RT for symptomatic LMD, also Enhertu infusions  Interval History: Tara White presents today after recent clinical changes, all over the past ~5 days.  She describes new onset left facial droop and difficulty closing her left eyelid.  This has been accompanied by difficulty with chewing, though without frank facial numbness.  Also describes drooping of the left eyelid, and blurry vision in the right eye.  No changes in spine symptoms, numbness in foot, leg and left  arm.  Continues to walk independently, no hearing impairment or vertigo.         Prior- She describes new onset right leg pain, shooting down back of her leg from lower back.  Also now having numbness along the lower anterior  portion of the left foot, as well as patch of numbness on the left shoulder.  Fullness and aching in her neck has worsened as well.  She has tried advil, naltrexone, oxycodone and gabapentin 300mg  at night for the symptoms, none of which has clearly helped.   She denies issues with urination or numbness in groin.  Walking is normal except for the numbness impairing the left foot. Still has not initiated systemic therapy yet for HER2+ breast cancer, has appt upcoming with Dr. Caswell Corwin at [facility].  Prior- Presented to neurologic attention in mid-June with ~1 week history of double vision and neck pain.  CNS imaging demonstrated large left frontal mass consistent with metastasis from breast cancer.  She recently completed radiation therapy at [facility].  Currently feeling normal, at baseline aside from swelling in legs and face from decadron.  Currently dosign 6mg  daily with taper schedule in place.  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  Medications: Current Outpatient Medications on File Prior to Visit  Medication Sig Dispense Refill   ascorbic acid (VITAMIN C) 1000 MG tablet Take 1,000 mg by mouth daily.     carboxymethylcellulose (REFRESH TEARS) 0.5 % SOLN Place 1 drop into both eyes 3 (three) times daily as needed (Dry eye).     Cholecalciferol (VITAMIN D) 125 MCG (5000 UT) CAPS Take 5,000 Int'l Units/L by mouth daily.     Cholecalciferol (VITAMIN D-3) 5000 UNIT/ML LIQD Take by mouth.     cimetidine (TAGAMET) 400 MG tablet Take 400 mg by mouth 2 (two) times daily.     dexamethasone (DECADRON) 4 MG tablet Take 1 tablet (4 mg total) by mouth 2 (two) times daily with a meal. 60 tablet 0   diphenhydrAMINE (BENADRYL) 25 mg capsule Take 25 mg by mouth at bedtime as needed.     dipyridamole (PERSANTINE) 75 MG tablet Take 75 mg by mouth 2 (two) times daily.     DULoxetine (CYMBALTA) 30 MG capsule Take 1 capsule (30 mg total) by mouth daily. 15 capsule 0   etodolac  (LODINE) 300 MG capsule Take 300 mg by mouth 2 (two) times daily.     gabapentin (NEURONTIN) 300 MG capsule Take 2 capsules (600 mg) twice daily and 3 capsules (900 mg) at bedtime. 210 capsule 0   Hydroxychloroquine Sulfate 100 MG TABS Take 1 tablet by mouth 2 (two) times daily.     lidocaine-prilocaine (EMLA) cream Apply to affected area once 30 g 3   lovastatin (MEVACOR) 20 MG tablet Take 20 mg by mouth at bedtime.     magnesium oxide (MAG-OX) 400 MG tablet Take 400 mg by mouth daily.     Melatonin 10 MG CAPS Take 10 mg by mouth at bedtime.     metFORMIN (GLUCOPHAGE) 500 MG tablet Take 500 mg by mouth 2 (two) times daily.     minocycline (MINOCIN) 100 MG capsule Take 100 mg by mouth 2 (two) times daily.     NALTREXONE HCL PO Take 3 mg by mouth daily.     nitrofurantoin, macrocrystal-monohydrate, (MACROBID) 100 MG capsule Take 100 mg by mouth every 12 (twelve) hours.     NON FORMULARY Take 3 mg by  mouth at bedtime. LOW DOSE NALTROXENE Takes when not taking oxycodone     ondansetron (ZOFRAN) 8 MG tablet Take 1 tablet (8 mg total) by mouth every 8 (eight) hours as needed for nausea or vomiting. Start on the third day after chemotherapy. 30 tablet 1   oxyCODONE-acetaminophen (PERCOCET/ROXICET) 5-325 MG tablet Take 1 tablet by mouth every 8 (eight) hours as needed for severe pain. 60 tablet 0   prochlorperazine (COMPAZINE) 10 MG tablet Take 1 tablet (10 mg total) by mouth every 6 (six) hours as needed for nausea or vomiting. 30 tablet 1   rOPINIRole (REQUIP) 0.25 MG tablet Take 1 tablet (0.25 mg total) by mouth at bedtime. 30 tablet 0   temazepam (RESTORIL) 15 MG capsule Take 1-2 capsules (15-30 mg total) by mouth at bedtime as needed for sleep. 30 capsule 0   thyroid (ARMOUR) 60 MG tablet Take 60 mg by mouth daily before breakfast.     Vitamin D, Ergocalciferol, (DRISDOL) 1.25 MG (50000 UNIT) CAPS capsule Take 50,000 Units by mouth every 7 (seven) days.     No current facility-administered  medications on file prior to visit.    Allergies: No Known Allergies Past Medical History:  Past Medical History:  Diagnosis Date   DVT (deep venous thrombosis) (HCC) 06/25/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   Family history of breast cancer 04/21/2021   right breast ca 03/2021   Past Surgical History:  Past Surgical History:  Procedure Laterality Date   BREAST ENHANCEMENT SURGERY Left 06/25/2022   Procedure: MAMMOPLASTY AUGMENTATION, MASTOPEXY (BREAST);  Surgeon: Glenna Fellows, MD;  Location: [facility];  Service: Plastics;  Laterality: Left;   BREAST RECONSTRUCTION WITH PLACEMENT OF TISSUE EXPANDER AND ALLODERM Right 10/13/2021   Procedure: RIGHT BREAST RECONSTRUCTION WITH PLACEMENT OF TISSUE EXPANDER AND ALLODERM;  Surgeon: Glenna Fellows, MD;  Location: [facility];  Service: Plastics;  Laterality: Right;   CESAREAN SECTION N/A 12/10/2015   Procedure: CESAREAN SECTION;  Surgeon: Noland Fordyce, MD;  Location: [facility];  Service: Obstetrics;  Laterality: N/A;   IR CV LINE INJECTION  06/25/2021   IR IMAGING GUIDED PORT INSERTION  12/20/2022   IR RADIOLOGY PERIPHERAL GUIDED IV START  06/25/2021   IR US GUIDE VASC ACCESS LEFT  06/25/2021   IR VENO/EXT/UNI LEFT  06/25/2021   NODE DISSECTION Right 10/13/2021   Procedure: TARGETED LYMPH NODE DISSECTION;  Surgeon: Abigail Miyamoto, MD;  Location: [facility];  Service: General;  Laterality: Right;   PORT-A-CATH REMOVAL Left 05/27/2022   Procedure: REMOVAL PORT-A-CATH;  Surgeon: Abigail Miyamoto, MD;  Location: [facility];  Service: General;  Laterality: Left;   PORTACATH PLACEMENT Left 04/30/2021   Procedure: INSERTION PORT-A-CATH;  Surgeon: Abigail Miyamoto, MD;  Location: [facility];  Service: General;  Laterality: Left;   REMOVAL OF TISSUE EXPANDER AND PLACEMENT OF IMPLANT Right 06/25/2022   Procedure: REMOVAL OF TISSUE EXPANDER AND PLACEMENT OF IMPLANT;  Surgeon: Glenna Fellows, MD;   Location: [facility];  Service: Plastics;  Laterality: Right;   SIMPLE MASTECTOMY WITH AXILLARY SENTINEL NODE BIOPSY Right 10/13/2021   Procedure: RIGHT MASTECTOMY;  Surgeon: Abigail Miyamoto, MD;  Location:  SURGERY CENTER;  Service: General;  Laterality: Right;   WISDOM TOOTH EXTRACTION     Social History:  Social History   Socioeconomic History   Marital status: Married    Spouse name: Not on file   Number of children: Not on file   Years of education: Not on file  Highest education level: Not on file  Occupational History   Not on file  Tobacco Use   Smoking status: Never   Smokeless tobacco: Never  Vaping Use   Vaping status: Not on file  Substance and Sexual Activity   Alcohol use: Yes    Alcohol/week: 2.0 standard drinks of alcohol    Types: 2 Standard drinks or equivalent per week    Comment: rarely   Drug use: No   Sexual activity: Not Currently    Partners: Male    Birth control/protection: Other-see comments    Comment: cycles have not resumed since receiving chemotherapy  Other Topics Concern   Not on file  Social History Narrative   Not on file   Social Determinants of Health   Financial Resource Strain: Low Risk  (11/01/2022)   [facility]   Overall Financial Resource Strain (CARDIA)    Difficulty of Paying Living Expenses: Not hard at all  Food Insecurity: No Food Insecurity (11/01/2022)   Received from [facility]   Hunger Vital Sign    Worried About Running Out of Food in the Last Year: Never true    Ran Out of Food in the Last Year: Never true  Transportation Needs: No Transportation Needs (11/01/2022)   [facility] - Transportation    Lack of Transportation (Medical): No    Lack of Transportation (Non-Medical): No  Physical Activity: Not on file  Stress: Not on file  Social Connections: Unknown (07/27/2021)   [facility]   Social Network    Social Network: Not on file   Intimate Partner Violence: Not At Risk (09/15/2022)   Humiliation, Afraid, Rape, and Kick questionnaire    Fear of Current or Ex-Partner: No    Emotionally Abused: No    Physically Abused: No    Sexually Abused: No   Family History:  Family History  Problem Relation Age of Onset   Hypothyroidism Mother    Breast cancer Maternal Grandmother 82   Skin cancer Maternal Grandfather    Heart failure Paternal Grandfather     Review of Systems: Constitutional: Doesn't report fevers, chills or abnormal weight loss Eyes: Doesn't report blurriness of vision Ears, nose, mouth, throat, and face: Doesn't report sore throat Respiratory: Doesn't report cough, dyspnea or wheezes Cardiovascular: Doesn't report palpitation, chest discomfort  Gastrointestinal:  Doesn't report nausea, constipation, diarrhea GU: Doesn't report incontinence Skin: Doesn't report skin rashes Neurological: Per HPI Musculoskeletal: Doesn't report joint pain Behavioral/Psych: Doesn't report anxiety  Physical Exam: Vitals:   01/18/23 1525  BP: 134/82  Pulse: (!) 59  Resp: 20  Temp: (!) 97.5 F (36.4 C)  SpO2: 100%   KPS: 80. General: Alert, cooperative, pleasant, in no acute distress Head: Normal EENT: No conjunctival injection or scleral icterus.  Lungs: Resp effort normal Cardiac: Regular rate Abdomen: Non-distended abdomen Skin: No rashes cyanosis or petechiae. Extremities: No clubbing or edema  Neurologic Exam: Mental Status: Awake, alert, attentive to examiner. Oriented to self and environment. Language is fluent with intact comprehension.  Cranial Nerves: Visual acuity is grossly normal. Visual fields are full. Extra-ocular movements intact. Right eye ptosis. Left LMN facial parsesis. Motor: Tone and bulk are normal. Power is full in both arms and legs. Reflexes are symmetric, no pathologic reflexes present.  Sensory: Impaired in left arm and leg, patchy Gait: Normal.   Labs: I have reviewed the  data as listed    Component Value Date/Time  NA 134 (L) 01/17/2023 2138   K 3.7 01/17/2023 2138   CL 101 01/17/2023 2138   CO2 25 01/17/2023 2138   GLUCOSE 127 (H) 01/17/2023 2138   BUN 25 (H) 01/17/2023 2138   CREATININE 0.74 01/17/2023 2138   CREATININE 0.73 01/14/2023 0933   CALCIUM 8.9 01/17/2023 2138   PROT 7.0 01/14/2023 0933   ALBUMIN 4.5 01/14/2023 0933   AST 18 01/14/2023 0933   ALT 17 01/14/2023 0933   ALKPHOS 50 01/14/2023 0933   BILITOT 0.7 01/14/2023 0933   GFRNONAA >60 01/17/2023 2138   GFRNONAA >60 01/14/2023 0933   GFRAA >60 12/17/2015 0610   Lab Results  Component Value Date   WBC 5.4 01/17/2023   NEUTROABS 4.9 01/17/2023   HGB 11.1 (L) 01/17/2023   HCT 31.8 (L) 01/17/2023   MCV 95.8 01/17/2023   PLT 184 01/17/2023    Imaging:  [facility] Clinician Interpretation: I have personally reviewed the CNS images as listed.  My interpretation, in the context of the patient's clinical presentation, is progressive disease with novel multifocal leptomeningeal dissemination primarily in skull base and cranial nerves  MR BRAIN W WO CONTRAST  Result Date: 01/18/2023 CLINICAL DATA:  Metastatic breast carcinoma EXAM: MRI HEAD WITHOUT AND WITH CONTRAST MRI CERVICAL SPINE WITHOUT AND WITH CONTRAST TECHNIQUE: Multiplanar, multiecho pulse sequences of the brain and surrounding structures, and cervical spine, to include the craniocervical junction and cervicothoracic junction, were obtained without and with intravenous contrast. CONTRAST:  7mL GADAVIST GADOBUTROL 1 MMOL/ML IV SOLN COMPARISON:  12/30/2022 brain MRI and 01/04/2019 for spinal MRI FINDINGS: MRI HEAD FINDINGS Brain: There is no acute infarct or acute hemorrhage. Peripherally contrast-enhancing lesion of the left caudate head is unchanged, measuring 2.7 x 2.3 cm (series 16, image 86). The degree of surrounding hyperintense T2-weighted signal is unchanged. There is mass effect on the frontal horn of the left lateral ventricle.  No hydrocephalus. No herniation. There are 3 other lesions: 1. Left temporal lobe lesion has increased in size from 3 mm to 7 mm (16:45). 2. Intracanalicular lesion of the left internal auditory canal measures 3 mm, previously 2 mm (16:38). 3. 4 mm interpeduncular lesion (16:53) has slightly increased in size. There is mild contrast enhancement along the cisternal segment of the left trigeminal nerve (16:44). Vascular: Normal flow voids. Skull: Normal calvarium. Sinuses/Orbits:No paranasal sinus fluid levels or advanced mucosal thickening. No mastoid or middle ear effusion. Normal orbits. MRI CERVICAL SPINE FINDINGS Alignment: Reversal of normal cervical lordosis. Vertebrae: No fracture, evidence of discitis, or bone lesion. Cord: Contrast enhancing lesions at C2 and T3 are unchanged, allowing for differences in technique. No new spinal cord lesions are identified. Posterior Fossa, vertebral arteries, paraspinal tissues: Normal vertebral artery flow voids. No prevertebral abnormality. Disc levels: C4-5 right asymmetric disc bulge with mild right foraminal stenosis. The other disc levels are unremarkable. IMPRESSION: 1. Increased size of 3 intracranial metastases, including the left temporal lobe, left internal auditory canal and interpeduncular midbrain. 2. Unchanged size of left caudate head metastasis. 3. 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 4. 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 Electronically Signed   By: Deatra Robinson M.D.   On: 01/18/2023 00:52   MR Cervical Spine W and Wo Contrast  Result Date: 01/18/2023 CLINICAL DATA:  Metastatic breast carcinoma EXAM: MRI HEAD WITHOUT AND WITH CONTRAST MRI CERVICAL SPINE WITHOUT AND WITH CONTRAST TECHNIQUE: Multiplanar, multiecho pulse sequences of the brain  and surrounding structures, and cervical spine, to include the craniocervical junction and  cervicothoracic junction, were obtained without and with intravenous contrast. CONTRAST:  7mL GADAVIST GADOBUTROL 1 MMOL/ML IV SOLN COMPARISON:  12/30/2022 brain MRI and 01/04/2019 for spinal MRI FINDINGS: MRI HEAD FINDINGS Brain: There is no acute infarct or acute hemorrhage. Peripherally contrast-enhancing lesion of the left caudate head is unchanged, measuring 2.7 x 2.3 cm (series 16, image 86). The degree of surrounding hyperintense T2-weighted signal is unchanged. There is mass effect on the frontal horn of the left lateral ventricle. No hydrocephalus. No herniation. There are 3 other lesions: 1. Left temporal lobe lesion has increased in size from 3 mm to 7 mm (16:45). 2. Intracanalicular lesion of the left internal auditory canal measures 3 mm, previously 2 mm (16:38). 3. 4 mm interpeduncular lesion (16:53) has slightly increased in size. There is mild contrast enhancement along the cisternal segment of the left trigeminal nerve (16:44). Vascular: Normal flow voids. Skull: Normal calvarium. Sinuses/Orbits:No paranasal sinus fluid levels or advanced mucosal thickening. No mastoid or middle ear effusion. Normal orbits. MRI CERVICAL SPINE FINDINGS Alignment: Reversal of normal cervical lordosis. Vertebrae: No fracture, evidence of discitis, or bone lesion. Cord: Contrast enhancing lesions at C2 and T3 are unchanged, allowing for differences in technique. No new spinal cord lesions are identified. Posterior Fossa, vertebral arteries, paraspinal tissues: Normal vertebral artery flow voids. No prevertebral abnormality. Disc levels: C4-5 right asymmetric disc bulge with mild right foraminal stenosis. The other disc levels are unremarkable. IMPRESSION: 1. Increased size of 3 intracranial metastases, including the left temporal lobe, left internal auditory canal and interpeduncular midbrain. 2. Unchanged size of left caudate head metastasis. 3. 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 4. Mild contrast  enhancement along the cisternal segment of the left trigeminal nerve, which is likely another CSF-disseminated metastasis. Electronically Signed   By: Deatra Robinson M.D.   On: 01/18/2023 00:52   CT Head Wo Contrast  Result Date: 01/17/2023 CLINICAL DATA:  Monitoring of head and neck cancer. EXAM: CT HEAD WITHOUT CONTRAST TECHNIQUE: Contiguous axial images were obtained from the base of the skull through the vertex without intravenous contrast. RADIATION DOSE REDUCTION: This exam was performed according to the departmental dose-optimization program which includes automated exposure control, adjustment of the mA and/or kV according to patient size and/or use of iterative reconstruction technique. COMPARISON:  MRI brain 12/30/2022 FINDINGS: Brain: There is a space-occupying lesion in the left frontal white matter measuring about 2.9 cm in diameter. There is adjacent vasogenic edema. This causes mass effect with effacement of the anterior horn of the left lateral ventricle and left-to-right midline shift of about 4 mm. No significant change in appearance in comparison to the previous MRI. Central increased attenuation within the mass may represent hemorrhage or calcification. No additional lesions are identified. No abnormal extra-axial fluid collections. Basal cisterns are not effaced. Vascular: No hyperdense vessel or unexpected calcification. Skull: Normal. Negative for fracture or focal lesion. Sinuses/Orbits: Paranasal sinuses and mastoid air cells are clear. Other: None. IMPRESSION: 1. 2.9 cm diameter space-occupying lesion in the left frontal white matter with associated vasogenic edema and mass-effect as described. Central increased attenuation could indicate calcification or internal hemorrhage. The lesion appears similar to the prior MRI, allowing for differences in modality. 2. No additional lesions are identified. MRI would be more sensitive for evaluation of metastatic intracranial process if  clinically indicated. Electronically Signed   By: Burman Nieves M.D.   On: 01/17/2023 19:13  MR TOTAL SPINE METS SCREENING  Result Date: 01/04/2023 CLINICAL DATA:  Metastatic breast cancer. Neck and back pain with numbness in the left foot. Cervical spinal cord lesion seen on recent head MRI. EXAM: MRI TOTAL SPINE WITHOUT AND WITH CONTRAST TECHNIQUE: Multisequence MR imaging of the spine from the cervical spine to the sacrum was performed prior to and following IV contrast administration for evaluation of spinal metastatic disease. CONTRAST:  7mL GADAVIST GADOBUTROL 1 MMOL/ML IV SOLN COMPARISON:  Head MRI 12/30/2022 FINDINGS: Axial imaging is motion degraded. MRI CERVICAL SPINE FINDINGS Alignment: Reversal of the normal cervical lordosis.  No listhesis. Vertebrae: No fracture, suspicious marrow lesion, or significant marrow edema. Cord: 6 mm solidly enhancing intramedullary lesion in the right ventral spinal cord at C2 with mild surrounding edema as seen on the recent prior head MRI. Posterior Fossa, vertebral arteries, paraspinal tissues: No metastasis in the left basal ganglia with associated edema, more fully evaluated on the recent head MRI. Disc levels: Mild spondylosis with mild spinal stenosis at C4-5 due to disc bulging. MRI THORACIC SPINE FINDINGS Alignment:  Normal. Vertebrae: No fracture, suspicious marrow lesion, or significant marrow edema. Cord: 3 mm enhancing lesion in the left dorsal cord at T3. 5 mm enhancing lesion in the left dorsal cord at T5. Paraspinal and other soft tissues: Unremarkable. Disc levels: No significant degenerative changes. MRI LUMBAR SPINE FINDINGS Segmentation:  Standard. Alignment:  Mild lumbar levoscoliosis.  No listhesis. Vertebrae: No fracture, suspicious marrow lesion, or significant marrow edema. Conus medullaris: Extends to the L1-2 level and appears normal. Suspected punctate enhancing foci along the cauda equina on the right at L1 (series 16, image 6), in the  midline at L2 (series 16, image 8), and on the left at L4 (series 16, image 8), however motion artifact on axial imaging limits confirmation. Paraspinal and other soft tissues: Unremarkable. Disc levels: Minor spondylosis without evidence of significant stenosis. IMPRESSION: Three small enhancing lesions in the cervical and thoracic spinal cord and suspected punctate lesions along the cauda equina consistent with metastases. Mild cord edema associated with the C2 lesion. Electronically Signed   By: Sebastian Ache M.D.   On: 01/04/2023 18:22   MR Brain W Wo Contrast  Result Date: 12/30/2022 CLINICAL DATA:  Breast cancer with metastatic disease EXAM: MRI HEAD WITHOUT AND WITH CONTRAST TECHNIQUE: Multiplanar, multiecho pulse sequences of the brain and surrounding structures were obtained without and with intravenous contrast. CONTRAST:  7mL GADAVIST GADOBUTROL 1 MMOL/ML IV SOLN COMPARISON:  Brain MRI 09/15/2022 FINDINGS: Brain: The 2.8 cm x 2.2 cm x 2.3 cm peripherally enhancing lesion centered in the left caudate head is decreased in size from 3.5 cm x 3.2 cm x 3.3 cm on the prior study. There is persistent enhancement along the ependymal lining of the left frontal horn (16-101). Surrounding vasogenic edema in the left frontal lobe extending medially across the genu of the corpus callosum and inferiorly to the internal and external capsules is also improved, with resolved signal abnormality in the midbrain. There is improved regional mass effect with persistent effacement of the left temporal horn and trace rightward midline shift, decreased from 1.0 cm on the prior study. There are no new intracranial lesions. There is no acute intracranial hemorrhage, extra-axial fluid collection, or acute infarct. Parenchymal volume is normal. The ventricles are otherwise normal in size. Parenchymal signal is otherwise normal. The pituitary and suprasellar region are normal. Vascular: Normal flow voids. Skull and upper cervical  spine: There is no suspicious marrow signal  abnormality. There is a 4 mm enhancing lesion in the cervical cord at the C2 level to the right of midline, new since the prior study. Sinuses/Orbits: The paranasal sinuses are clear. The globes and orbits are unremarkable. Other: The mastoid air cells and middle ear cavities are clear. IMPRESSION: Since 09/15/2022: 1. Decreased size of the enhancing metastatic lesion centered in the left caudate head with improved surrounding edema and essentially resolved rightward midline shift. There is persistent enhancement along the ependymal lining of the left frontal horn. 2. 4 mm enhancing lesion in the cervical cord at the C2 level is new from the prior study. Recommend dedicated imaging of the total spine with and without contrast to evaluate for additional intramedullary metastatic lesions. Electronically Signed   By: Lesia Hausen M.D.   On: 12/30/2022 15:23   IR IMAGING GUIDED PORT INSERTION  Result Date: 12/20/2022 INDICATION: History of right-sided breast cancer, now with concern for metastatic disease. In need of durable intravenous access for chemotherapy administration. EXAM: IMPLANTED PORT A CATH PLACEMENT WITH ULTRASOUND AND FLUOROSCOPIC GUIDANCE COMPARISON:  CT of the chest, abdomen pelvis-09/15/2022 MEDICATIONS: None ANESTHESIA/SEDATION: Moderate (conscious) sedation was employed during this procedure as administered by the Interventional Radiology RN. A total of Versed 3 mg and Fentanyl 150 mcg was administered intravenously. Moderate Sedation Time: 24 minutes. The patient's level of consciousness and vital signs were monitored continuously by radiology nursing throughout the procedure under my direct supervision. Note, the patient's peripheral IV appeared to be malfunctioning and as such it is likely the patient only received 1 mg of Versed and 50 mcg of Fentanyl (which was administered via the port a catheter after placement) during this procedure. CONTRAST:   None FLUOROSCOPY TIME:  30 seconds (1 mGy) COMPLICATIONS: None immediate. PROCEDURE: The procedure, risks, benefits, and alternatives were explained to the patient. Questions regarding the procedure were encouraged and answered. The patient understands and consents to the procedure. The left neck and chest were prepped with chlorhexidine in a sterile fashion, and a sterile drape was applied covering the operative field. Maximum barrier sterile technique with sterile gowns and gloves were used for the procedure. A timeout was performed prior to the initiation of the procedure. Local anesthesia was provided with 1% lidocaine with epinephrine. After creating a small venotomy incision, a micropuncture kit was utilized to access the internal jugular vein. Real-time ultrasound guidance was utilized for vascular access including the acquisition of a permanent ultrasound image documenting patency of the accessed vessel. The microwire was utilized to measure appropriate catheter length. A subcutaneous port pocket was then created along the upper chest wall utilizing a combination of sharp and blunt dissection. The pocket was irrigated with sterile saline. A single lumen AngioDynamics power injectable port was chosen for placement. The 8 Fr catheter was tunneled from the port pocket site to the venotomy incision. The port was placed in the pocket. The external catheter was trimmed to appropriate length. At the venotomy, an 8 Fr peel-away sheath was placed over a guidewire under fluoroscopic guidance. The catheter was then placed through the sheath and the sheath was removed. Final catheter positioning was confirmed and documented with a fluoroscopic spot radiograph. The port was accessed with a Huber needle, aspirated and flushed with heparinized saline. The venotomy site was closed with an interrupted 4-0 Vicryl suture. The port pocket incision was closed with interrupted 2-0 Vicryl suture. The skin was opposed with a  running subcuticular 4-0 Vicryl suture. Dermabond and Steri-strips were applied to both  incisions. Dressings were applied. The patient tolerated the procedure well without immediate post procedural complication. FINDINGS: After catheter placement, the tip lies within the superior cavoatrial junction. The catheter aspirates and flushes normally and is ready for immediate use. IMPRESSION: Successful placement of a left internal jugular approach power injectable Port-A-Cath. The catheter is ready for immediate use. Electronically Signed   By: Simonne Come M.D.   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  Some of her symptoms, specifically right eye ptosis and left eye visual acuity impairment, are also representative of LMD that is not (yet) apparent on imaging.    We strongly recommended proceeding with WBRT, as was discussed with Dr. Basilio Cairo.  She will also complete spine RT treatments to both cervical and lumbar regions.  Enhertu will be continued q3 weeks in addition, with Dr. Al Pimple and Pamelia Hoit.  Will con't to follow with Dr. Caswell Corwin as well.  For neuropathic pain, recommended uptitration of Gabapentin.  May take 600mg  BID and increase to 900mg  HS or BID if necessary.    She may also continue decadron 4mg  daily in the iterim, which has helped with back pain and symptoms.  We appreciate the opportunity to participate in the care of Tara White.  We will plan to see her 1 month following WBRT with brain and total spine MRI studies for evaluation, or sooner if needed.    All questions were answered. The patient knows to call the clinic with any problems, questions or concerns. No barriers to learning were detected.  The total time spent in the encounter was 40 minutes and more than  50% was on counseling and review of test results   Henreitta Leber, MD Medical Director of Neuro-Oncology [facility] 01/18/23 4:27 PM

## 2023-01-18 NOTE — Discharge Instructions (Addendum)
Please follow-up with your oncologist.  Return for any change in mental status.  Your MRI imaging revealed the following: FINDINGS:  MRI HEAD FINDINGS    Brain: There is no acute infarct or acute hemorrhage. Peripherally  contrast-enhancing lesion of the left caudate head is unchanged,  measuring 2.7 x 2.3 cm (series 16, image 86). The degree of  surrounding hyperintense T2-weighted signal is unchanged. There is  mass effect on the frontal horn of the left lateral ventricle. No  hydrocephalus. No herniation. There are 3 other lesions:    1. Left temporal lobe lesion has increased in size from 3 mm to 7 mm  (16:45).  2. Intracanalicular lesion of the left internal auditory canal  measures 3 mm, previously 2 mm (16:38).  3. 4 mm interpeduncular lesion (16:53) has slightly increased in  size.    There is mild contrast enhancement along the cisternal segment of  the left trigeminal nerve (16:44).    Vascular: Normal flow voids.    Skull: Normal calvarium.    Sinuses/Orbits:No paranasal sinus fluid levels or advanced mucosal  thickening. No mastoid or middle ear effusion. Normal orbits.    MRI CERVICAL SPINE FINDINGS    Alignment: Reversal of normal cervical lordosis.    Vertebrae: No fracture, evidence of discitis, or bone lesion.    Cord: Contrast enhancing lesions at C2 and T3 are unchanged,  allowing for differences in technique. No new spinal cord lesions  are identified.    Posterior Fossa, vertebral arteries, paraspinal tissues: Normal  vertebral artery flow voids. No prevertebral abnormality.    Disc levels: C4-5 right asymmetric disc bulge with mild right  foraminal stenosis. The other disc levels are unremarkable.    IMPRESSION:  1. Increased size of 3 intracranial metastases, including the left  temporal lobe, left internal auditory canal and interpeduncular  midbrain.  2. Unchanged size of left caudate head metastasis.  3. 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  4. 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

## 2023-01-18 NOTE — Telephone Encounter (Signed)
Patient called office, states she wants to speak with Dr Barbaraann Cao regarding recent ED visit & MRI.  Per Dr Barbaraann Cao ok to schedule in person visit at 3:30, patient informed, she verbalizes understanding.

## 2023-01-18 NOTE — ED Notes (Signed)
Pt refused steroid due to taking her own at home. Physician agreed. Port flushed and heparin locked and deaccessed

## 2023-01-18 NOTE — Telephone Encounter (Signed)
PC to patient, informed her Dr Barbaraann Cao is aware she was in the ED last night with new symptoms & has seen her MRI - he is available to meet with her over the phone today if she is is interested.  Instructed patient to contact this office if she wants to set up a phone appointment, [phone].

## 2023-01-18 NOTE — Telephone Encounter (Signed)
Pt called RN back and confirmed that she can come in to discuss recent findings with Dr. Basilio Cairo. Pt also has an appointment with Dr. Barbaraann Cao as well. RN placed under treat visit in for pt.

## 2023-01-19 ENCOUNTER — Ambulatory Visit (HOSPITAL_COMMUNITY)
Admission: RE | Admit: 2023-01-19 | Discharge: 2023-01-19 | Disposition: A | Discharge status: Home / Self Care | Payer: Commercial Managed Care - PPO | Source: Ambulatory Visit | Attending: Hematology and Oncology

## 2023-01-19 ENCOUNTER — Other Ambulatory Visit: Payer: Self-pay

## 2023-01-19 ENCOUNTER — Ambulatory Visit
Admission: RE | Admit: 2023-01-19 | Discharge: 2023-01-19 | Disposition: A | Discharge status: Home / Self Care | Payer: Commercial Managed Care - PPO | Source: Ambulatory Visit | Attending: Radiation Oncology | Admitting: Radiation Oncology

## 2023-01-19 DIAGNOSIS — Z171 Estrogen receptor negative status [ER-]: Secondary | ICD-10-CM | POA: Insufficient documentation

## 2023-01-19 DIAGNOSIS — Z0189 Encounter for other specified special examinations: Secondary | ICD-10-CM

## 2023-01-19 DIAGNOSIS — C50411 Malignant neoplasm of upper-outer quadrant of right female breast: Secondary | ICD-10-CM | POA: Insufficient documentation

## 2023-01-19 DIAGNOSIS — Z510 Encounter for antineoplastic radiation therapy: Secondary | ICD-10-CM | POA: Diagnosis not present

## 2023-01-19 LAB — RAD ONC ARIA SESSION SUMMARY
Course Elapsed Days: 7
Plan Fractions Treated to Date: 1
Plan Fractions Treated to Date: 6
Plan Prescribed Dose Per Fraction: 2.5 Gy
Plan Prescribed Dose Per Fraction: 3 Gy
Plan Total Fractions Prescribed: 10
Plan Total Fractions Prescribed: 6
Plan Total Prescribed Dose: 15 Gy
Plan Total Prescribed Dose: 30 Gy
Reference Point Dosage Given to Date: 18 Gy
Reference Point Dosage Given to Date: 2.5 Gy
Reference Point Session Dosage Given: 2.5 Gy
Reference Point Session Dosage Given: 3 Gy
Session Number: 6

## 2023-01-19 LAB — ECHOCARDIOGRAM COMPLETE
AR max vel: 2.52 cm2
AV Peak grad: 6.2 mm[Hg]
Ao pk vel: 1.24 m/s
Area-P 1/2: 3.21 cm2
MV M vel: 4.02 m/s
MV Peak grad: 64.6 mm[Hg]
S' Lateral: 3.1 cm

## 2023-01-19 NOTE — Progress Notes (Signed)
Echocardiogram 2D Echocardiogram has been performed.  Tara White 01/19/2023, 11:10 AM

## 2023-01-19 NOTE — Progress Notes (Deleted)
Palliative Medicine [facility]  [phone] [phone]   Name: Tara White Date: 01/19/2023 MRN: [id number]  DOB: November 25, 1990  Patient Care Team: Samuella Bruin as PCP - General (Physician Assistant) Serena Croissant, MD as Consulting Physician (Hematology and Oncology) Abigail Miyamoto, MD as Consulting Physician (General Surgery) Lonie Peak, MD as Attending Physician (Radiation Oncology) Pickenpack-Cousar, Arty Baumgartner, NP as Nurse Practitioner (Hospice and Palliative Medicine)    INTERVAL HISTORY: Tara White is a 32 y.o. 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 Palliative ask to see for symptom management and goals of care.   SOCIAL HISTORY:     reports that she has never smoked. She has never used smokeless tobacco. She reports current alcohol use of about 2.0 standard drinks of alcohol per week. She reports that she does not use drugs.  ADVANCE DIRECTIVES:  None on file  CODE STATUS: Full code  PAST MEDICAL HISTORY: Past Medical History:  Diagnosis Date   DVT (deep venous thrombosis) (HCC) 06/25/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   Family history of breast cancer 04/21/2021   right breast ca 03/2021    ALLERGIES:  has No Known Allergies.  MEDICATIONS:  Current Outpatient Medications  Medication Sig Dispense Refill   ascorbic acid (VITAMIN C) 1000 MG tablet Take 1,000 mg by mouth daily.     carboxymethylcellulose (REFRESH TEARS) 0.5 % SOLN Place 1 drop into both eyes 3 (three) times daily as needed (Dry eye).     Cholecalciferol (VITAMIN D) 125 MCG (5000 UT) CAPS Take 5,000 Int'l Units/L by mouth daily.     Cholecalciferol (VITAMIN D-3) 5000 UNIT/ML LIQD Take by mouth.     cimetidine (TAGAMET) 400 MG tablet Take 400 mg by mouth 2 (two) times daily.     dexamethasone (DECADRON) 4 MG  tablet Take 1 tablet (4 mg total) by mouth 2 (two) times daily with a meal. 60 tablet 0   diphenhydrAMINE (BENADRYL) 25 mg capsule Take 25 mg by mouth at bedtime as needed.     dipyridamole (PERSANTINE) 75 MG tablet Take 75 mg by mouth 2 (two) times daily.     DULoxetine (CYMBALTA) 30 MG capsule Take 1 capsule (30 mg total) by mouth daily. 15 capsule 0   etodolac (LODINE) 300 MG capsule Take 300 mg by mouth 2 (two) times daily.     gabapentin (NEURONTIN) 300 MG capsule Take 2 capsules (600 mg) twice daily and 3 capsules (900 mg) at bedtime. 210 capsule 0   Hydroxychloroquine Sulfate 100 MG TABS Take 1 tablet by mouth 2 (two) times daily.     lidocaine-prilocaine (EMLA) cream Apply to affected area once 30 g 3   lovastatin (MEVACOR) 20 MG tablet Take 20 mg by mouth at bedtime.     magnesium oxide (MAG-OX) 400 MG tablet Take 400 mg by mouth daily.     Melatonin 10 MG CAPS Take 10 mg by mouth at bedtime.     metFORMIN (GLUCOPHAGE) 500 MG tablet Take 500 mg by mouth 2 (two) times daily.     minocycline (MINOCIN) 100 MG capsule Take 100 mg by mouth 2 (two) times daily.     NALTREXONE HCL PO Take 3 mg by mouth daily.     nitrofurantoin, macrocrystal-monohydrate, (MACROBID) 100 MG capsule Take 100 mg by mouth every 12 (twelve) hours.     NON FORMULARY Take 3  mg by mouth at bedtime. LOW DOSE NALTROXENE Takes when not taking oxycodone     ondansetron (ZOFRAN) 8 MG tablet Take 1 tablet (8 mg total) by mouth every 8 (eight) hours as needed for nausea or vomiting. Start on the third day after chemotherapy. 30 tablet 1   oxyCODONE-acetaminophen (PERCOCET/ROXICET) 5-325 MG tablet Take 1 tablet by mouth every 8 (eight) hours as needed for severe pain. 60 tablet 0   prochlorperazine (COMPAZINE) 10 MG tablet Take 1 tablet (10 mg total) by mouth every 6 (six) hours as needed for nausea or vomiting. 30 tablet 1   rOPINIRole (REQUIP) 0.25 MG tablet Take 1 tablet (0.25 mg total) by mouth at bedtime. 30 tablet 0    temazepam (RESTORIL) 15 MG capsule Take 1-2 capsules (15-30 mg total) by mouth at bedtime as needed for sleep. 30 capsule 0   thyroid (ARMOUR) 60 MG tablet Take 60 mg by mouth daily before breakfast.     Vitamin D, Ergocalciferol, (DRISDOL) 1.25 MG (50000 UNIT) CAPS capsule Take 50,000 Units by mouth every 7 (seven) days.     No current facility-administered medications for this visit.    VITAL SIGNS: There were no vitals taken for this visit. There were no vitals filed for this visit.  Estimated body mass index is 22.64 kg/m as calculated from the following:   Height as of 01/17/23: 5\' 9"  (1.753 m).   Weight as of 01/18/23: 153 lb 4.8 oz (69.5 kg).   PERFORMANCE STATUS (ECOG) : {CHL ONC ECOG [id number]   Physical Exam General: NAD Cardiovascular: regular rate and rhythm Pulmonary: clear ant fields Abdomen: soft, nontender, + bowel sounds Extremities: no edema, no joint deformities Skin: no rashes Neurological:   IMPRESSION: ***  We discussed Her current illness and what it means in the larger context of Her on-going co-morbidities. Natural disease trajectory and expectations were discussed.  I discussed the importance of continued conversation with family and their medical providers regarding overall plan of care and treatment options, ensuring decisions are within the context of the patients values and GOCs.  PLAN:    Patient expressed understanding and was in agreement with this plan. She also understands that She can call the clinic at any time with any questions, concerns, or complaints.   Any controlled substances utilized were prescribed in the context of palliative care. PDMP has been reviewed.    Visit consisted of counseling and education dealing with the complex and emotionally intense issues of symptom management and palliative care in the setting of serious and potentially life-threatening illness.  Willette Alma, AGPCNP-BC  Palliative  Medicine Team/[facility]  *Please note that this is a verbal dictation therefore any spelling or grammatical errors are due to the "[employer]agon [employer] One" system interpretation.

## 2023-01-20 ENCOUNTER — Other Ambulatory Visit: Payer: Self-pay

## 2023-01-20 ENCOUNTER — Ambulatory Visit
Admission: RE | Admit: 2023-01-20 | Discharge: 2023-01-20 | Disposition: A | Discharge status: Home / Self Care | Payer: Commercial Managed Care - PPO | Source: Ambulatory Visit | Attending: Radiation Oncology | Admitting: Radiation Oncology

## 2023-01-20 ENCOUNTER — Inpatient Hospital Stay: Payer: Commercial Managed Care - PPO | Admitting: Nurse Practitioner

## 2023-01-20 DIAGNOSIS — Z510 Encounter for antineoplastic radiation therapy: Secondary | ICD-10-CM | POA: Diagnosis not present

## 2023-01-20 LAB — RAD ONC ARIA SESSION SUMMARY
Course Elapsed Days: 8
Plan Fractions Treated to Date: 2
Plan Fractions Treated to Date: 7
Plan Prescribed Dose Per Fraction: 2.5 Gy
Plan Prescribed Dose Per Fraction: 3 Gy
Plan Total Fractions Prescribed: 10
Plan Total Fractions Prescribed: 6
Plan Total Prescribed Dose: 15 Gy
Plan Total Prescribed Dose: 30 Gy
Reference Point Dosage Given to Date: 21 Gy
Reference Point Dosage Given to Date: 5 Gy
Reference Point Session Dosage Given: 2.5 Gy
Reference Point Session Dosage Given: 3 Gy
Session Number: 7

## 2023-01-21 ENCOUNTER — Inpatient Hospital Stay: Payer: Commercial Managed Care - PPO | Admitting: Nurse Practitioner

## 2023-01-21 ENCOUNTER — Telehealth: Payer: Self-pay

## 2023-01-21 ENCOUNTER — Ambulatory Visit
Admission: RE | Admit: 2023-01-21 | Discharge: 2023-01-21 | Disposition: A | Discharge status: Home / Self Care | Payer: Commercial Managed Care - PPO | Source: Ambulatory Visit | Attending: Radiation Oncology

## 2023-01-21 ENCOUNTER — Other Ambulatory Visit: Payer: Self-pay

## 2023-01-21 ENCOUNTER — Encounter: Payer: Self-pay | Admitting: Hematology and Oncology

## 2023-01-21 ENCOUNTER — Inpatient Hospital Stay (HOSPITAL_BASED_OUTPATIENT_CLINIC_OR_DEPARTMENT_OTHER): Payer: Commercial Managed Care - PPO | Admitting: Physician Assistant

## 2023-01-21 ENCOUNTER — Inpatient Hospital Stay: Payer: Commercial Managed Care - PPO

## 2023-01-21 VITALS — BP 135/74 | HR 83 | Temp 97.5°F | Resp 20 | Wt 156.8 lb

## 2023-01-21 DIAGNOSIS — C50411 Malignant neoplasm of upper-outer quadrant of right female breast: Secondary | ICD-10-CM

## 2023-01-21 DIAGNOSIS — Z510 Encounter for antineoplastic radiation therapy: Secondary | ICD-10-CM | POA: Diagnosis not present

## 2023-01-21 DIAGNOSIS — Z171 Estrogen receptor negative status [ER-]: Secondary | ICD-10-CM

## 2023-01-21 DIAGNOSIS — Z95828 Presence of other vascular implants and grafts: Secondary | ICD-10-CM

## 2023-01-21 LAB — RAD ONC ARIA SESSION SUMMARY
Course Elapsed Days: 9
Plan Fractions Treated to Date: 3
Plan Fractions Treated to Date: 8
Plan Prescribed Dose Per Fraction: 2.5 Gy
Plan Prescribed Dose Per Fraction: 3 Gy
Plan Total Fractions Prescribed: 10
Plan Total Fractions Prescribed: 6
Plan Total Prescribed Dose: 15 Gy
Plan Total Prescribed Dose: 30 Gy
Reference Point Dosage Given to Date: 24 Gy
Reference Point Dosage Given to Date: 7.5 Gy
Reference Point Session Dosage Given: 2.5 Gy
Reference Point Session Dosage Given: 3 Gy
Session Number: 8

## 2023-01-21 LAB — CBC WITH DIFFERENTIAL (CANCER CENTER ONLY)
Abs Immature Granulocytes: 0.05 10*3/uL (ref 0.00–0.07)
Basophils Absolute: 0 10*3/uL (ref 0.0–0.1)
Basophils Relative: 0 %
Eosinophils Absolute: 0 10*3/uL (ref 0.0–0.5)
Eosinophils Relative: 0 %
HCT: 33.6 % — ABNORMAL LOW (ref 36.0–46.0)
Hemoglobin: 11.8 g/dL — ABNORMAL LOW (ref 12.0–15.0)
Immature Granulocytes: 1 %
Lymphocytes Relative: 2 %
Lymphs Abs: 0.1 10*3/uL — ABNORMAL LOW (ref 0.7–4.0)
MCH: 33.3 pg (ref 26.0–34.0)
MCHC: 35.1 g/dL (ref 30.0–36.0)
MCV: 94.9 fL (ref 80.0–100.0)
Monocytes Absolute: 0.1 10*3/uL (ref 0.1–1.0)
Monocytes Relative: 2 %
Neutro Abs: 4.7 10*3/uL (ref 1.7–7.7)
Neutrophils Relative %: 95 %
Platelet Count: 130 10*3/uL — ABNORMAL LOW (ref 150–400)
RBC: 3.54 MIL/uL — ABNORMAL LOW (ref 3.87–5.11)
RDW: 11.5 % (ref 11.5–15.5)
WBC Count: 4.9 10*3/uL (ref 4.0–10.5)
nRBC: 0 % (ref 0.0–0.2)

## 2023-01-21 LAB — CMP (CANCER CENTER ONLY)
ALT: 26 U/L (ref 0–44)
AST: 21 U/L (ref 15–41)
Albumin: 3.9 g/dL (ref 3.5–5.0)
Alkaline Phosphatase: 46 U/L (ref 38–126)
Anion gap: 7 (ref 5–15)
BUN: 24 mg/dL — ABNORMAL HIGH (ref 6–20)
CO2: 25 mmol/L (ref 22–32)
Calcium: 8.9 mg/dL (ref 8.9–10.3)
Chloride: 105 mmol/L (ref 98–111)
Creatinine: 0.66 mg/dL (ref 0.44–1.00)
GFR, Estimated: >60 mL/min (ref >60)
Glucose, Bld: 120 mg/dL — ABNORMAL HIGH (ref 70–99)
Potassium: 4 mmol/L (ref 3.5–5.1)
Sodium: 137 mmol/L (ref 135–145)
Total Bilirubin: 0.3 mg/dL (ref 0.3–1.2)
Total Protein: 6.2 g/dL — ABNORMAL LOW (ref 6.5–8.1)

## 2023-01-21 LAB — VITAMIN D 25 HYDROXY (VIT D DEFICIENCY, FRACTURES): Vit D, 25-Hydroxy: 82.06 ng/mL (ref 30–100)

## 2023-01-21 MED ORDER — HEPARIN SOD (PORK) LOCK FLUSH 100 UNIT/ML IV SOLN
500.0000 [IU] | Freq: Once | INTRAVENOUS | Status: AC
  Administered 2023-01-21: 500 [IU]

## 2023-01-21 MED ORDER — SODIUM CHLORIDE 0.9% FLUSH
10.0000 mL | Freq: Once | INTRAVENOUS | Status: AC
  Administered 2023-01-21: 10 mL

## 2023-01-21 NOTE — Telephone Encounter (Signed)
This RN called pt to inquire about getting pt scheduled for a palliative care visit with Lowella Bandy, NP in order to renew pt's refill. This RN offered to get pt scheduled, if possible, on 11/8 for an appt. Pt agreeable to appt day.   Karena Addison, [place] has reached out to palliative care clinic with the above appt request.

## 2023-01-21 NOTE — Progress Notes (Signed)
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  Ultrasound revealed 3 cm mass at 10:00 plus small satellite lesions.  Biopsy revealed grade 3 IDC ER 0%, PR 0%, HER2 3+ positive, Ki-67 30%   04/08/2021 Cancer Staging   Staging form: Breast, AJCC 8th Edition - Clinical stage from 04/08/2021: Stage IIB (cT2, cN1, cM0, G3, ER-, PR-, HER2+) - Signed by Serena Croissant, MD on 04/15/2021 Stage prefix: Initial diagnosis Method of lymph node assessment: Axillary lymph node dissection Histologic grading system: 3 grade system   05/01/2021 - 11/26/2021 Chemotherapy   Patient is on Treatment Plan : BREAST  Docetaxel + Carboplatin + Trastuzumab + Pertuzumab  (TCHP) q21d      05/21/2021 Genetic Testing   Negative hereditary cancer genetic testing: no pathogenic variants detected in [facility] CustomNext-Cancer +RNAinsight Panel.  Report date is 05/21/2021.   The CustomNext-Cancer+RNAinsight panel offered by [employer] includes sequencing and rearrangement analysis for the following 47 genes:  APC, ATM, AXIN2, BARD1, BMPR1A, BRCA1, BRCA2, BRIP1, CDH1, CDK4, CDKN2A, CHEK2, DICER1, EPCAM, GREM1, HOXB13,  MEN1, MLH1, MSH2, MSH3, MSH6, MUTYH, NBN, NF1, NF2, NTHL1, PALB2, PMS2, POLD1, POLE, PTEN, RAD51C, RAD51D, RECQL, RET, SDHA, SDHAF2, SDHB, SDHC, SDHD, SMAD4, SMARCA4, STK11, TP53, TSC1, TSC2, and VHL.  RNA data is routinely analyzed for use in variant interpretation for all genes.   09/04/2021 - 04/21/2022 Chemotherapy   Patient is on Treatment Plan : BREAST Trastuzumab  + Pertuzumab q21d x 13 cycles     10/13/2021 Surgery   right mastectomy with reconstruction: No residual malignancy complete pathologic response 0/5 lymph nodes negative    12/03/2021 - 01/11/2022 Radiation Therapy   Site Technique Total Dose (Gy) Dose per Fx (Gy) Completed Fx Beam Energies  Chest Wall, Right: CW_R_IMN 3D 50.4/50.4 1.8 28/28 6XFFF  Chest Wall, Right: CW_R_PAB_SCV 3D 50.4/50.4 1.8 28/28 6X, 10X     12/31/2022 - 12/31/2022 Chemotherapy   Patient is on Treatment Plan : BREAST Trastuzumab + Pertuzumab q21d     01/11/2023 - 01/11/2023 Chemotherapy   Patient is on Treatment Plan : BREAST METASTATIC Fam-Trastuzumab Deruxtecan-nxki (Enhertu) (5.4) q21d     01/14/2023 -  Chemotherapy   Patient is on Treatment Plan : BREAST METASTATIC Fam-Trastuzumab Deruxtecan-nxki (Enhertu) (5.4) q21d      CURRENT TREATMENT: Enhertu q 3 weeks started on 01/14/2023.   INTERVAL HISTORY:  Tara White 32 y.o. female returns for a follow up for continued management of metastatic breast cancer after starting Enhertu therapy on 01/14/2023. She is unaccompanied for this visit. In the interim, she started whole brain radiation in addition to C2 and L2-S5.   On exam today, Ms. Gunkel reports that since starting Enhertu treatment on 01/14/2023 and radiation, she reports ongoing fatigue that requires her to rest frequently. She is able to do her  baseline ADLs. She denies any appetite changes. She does experience intermittent episodes of nausea without vomiting. She takes zofran as needed with improvement of nausea. She reports having  constipation after treatment and then turned to diarrhea after taking stool softeners. She reports 4-5 episodes of diarrhea per day which has improved after starting imodium. She denies easy bruising or signs of bleeding. She reports the left facial droop and blurry vision of right eye is unchanged. She is compliant with taking dexamethasone as prescribed. She denies any neuropathy or difficulty with ambulation. She denies fevers, chills, sweats, shortness of breath, chest pain or cough. She has no other complaints. Rest of the ROS is below.   MEDICAL HISTORY:  Past Medical History:  Diagnosis Date   DVT (deep venous thrombosis) (HCC) 06/25/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   Family history of breast cancer 04/21/2021   right breast ca 03/2021    SURGICAL HISTORY: Past Surgical History:  Procedure Laterality Date   BREAST ENHANCEMENT SURGERY Left 06/25/2022   Procedure: MAMMOPLASTY AUGMENTATION, MASTOPEXY (BREAST);  Surgeon: Glenna Fellows, MD;  Location: [facility];  Service: Plastics;  Laterality: Left;   BREAST RECONSTRUCTION WITH PLACEMENT OF TISSUE EXPANDER AND ALLODERM Right 10/13/2021   Procedure: RIGHT BREAST RECONSTRUCTION WITH PLACEMENT OF TISSUE EXPANDER AND ALLODERM;  Surgeon: Glenna Fellows, MD;  Location: [facility];  Service: Plastics;  Laterality: Right;   CESAREAN SECTION N/A 12/10/2015   Procedure: CESAREAN SECTION;  Surgeon: Noland Fordyce, MD;  Location: [facility];  Service: Obstetrics;  Laterality: N/A;   IR CV LINE INJECTION  06/25/2021   IR IMAGING GUIDED PORT INSERTION  12/20/2022   IR RADIOLOGY PERIPHERAL GUIDED IV START  06/25/2021   IR US GUIDE VASC ACCESS LEFT  06/25/2021   IR VENO/EXT/UNI LEFT  06/25/2021   NODE DISSECTION Right 10/13/2021   Procedure: TARGETED LYMPH NODE DISSECTION;  Surgeon: Abigail Miyamoto, MD;  Location: [facility];  Service: General;  Laterality: Right;   PORT-A-CATH REMOVAL Left 05/27/2022   Procedure: REMOVAL  PORT-A-CATH;  Surgeon: Abigail Miyamoto, MD;  Location: [facility];  Service: General;  Laterality: Left;   PORTACATH PLACEMENT Left 04/30/2021   Procedure: INSERTION PORT-A-CATH;  Surgeon: Abigail Miyamoto, MD;  Location: [facility];  Service: General;  Laterality: Left;   REMOVAL OF TISSUE EXPANDER AND PLACEMENT OF IMPLANT Right 06/25/2022   Procedure: REMOVAL OF TISSUE EXPANDER AND PLACEMENT OF IMPLANT;  Surgeon: Glenna Fellows, MD;  Location: [facility];  Service: Plastics;  Laterality: Right;   SIMPLE MASTECTOMY WITH AXILLARY SENTINEL NODE BIOPSY Right 10/13/2021   Procedure: RIGHT MASTECTOMY;  Surgeon: Abigail Miyamoto, MD;  Location: [facility];  Service: General;  Laterality: Right;   WISDOM TOOTH EXTRACTION      SOCIAL HISTORY: Social History   Socioeconomic History   Marital status: Married    Spouse name: Not on file   Number of children: Not on file   Years of education: Not on file   Highest education level: Not on file  Occupational History   Not on file  Tobacco Use   Smoking status: Never   Smokeless tobacco: Never  Vaping Use   Vaping status: Not on file  Substance and Sexual Activity   Alcohol use: Yes    Alcohol/week: 2.0 standard drinks of alcohol    Types: 2 Standard drinks or equivalent per week    Comment: rarely   Drug use: No   Sexual activity: Not Currently  Partners: Male    Birth control/protection: Other-see comments    Comment: cycles have not resumed since receiving chemotherapy  Other Topics Concern   Not on file  Social History Narrative   Not on file   Social Determinants of Health   Financial Resource Strain: Low Risk  (11/01/2022)   Received from [facility]   Overall Financial Resource Strain (CARDIA)    Difficulty of Paying Living Expenses: Not hard at all  Food Insecurity: No Food Insecurity (11/01/2022)   Received from [facility]   Hunger Vital Sign    Worried About Running Out of  Food in the Last Year: Never true    Ran Out of Food in the Last Year: Never true  Transportation Needs: No Transportation Needs (11/01/2022)   Received [facility] - [facility]    Lack of Transportation (Medical): No    Lack of Transportation (Non-Medical): No  Physical Activity: Not on file  Stress: Not on file  Social Connections: Unknown (07/27/2021)   Received from [facility]   Social Network    Social Network: Not on file  Intimate Partner Violence: Not At Risk (09/15/2022)   Humiliation, Afraid, Rape, and Kick questionnaire    Fear of Current or Ex-Partner: No    Emotionally Abused: No    Physically Abused: No    Sexually Abused: No    FAMILY HISTORY: Family History  Problem Relation Age of Onset   Hypothyroidism Mother    Breast cancer Maternal Grandmother 18   Skin cancer Maternal Grandfather    Heart failure Paternal Grandfather     ALLERGIES:  has No Known Allergies.  MEDICATIONS:  Current Outpatient Medications  Medication Sig Dispense Refill   ascorbic acid (VITAMIN C) 1000 MG tablet Take 1,000 mg by mouth daily. (Patient not taking: Reported on 01/21/2023)     carboxymethylcellulose (REFRESH TEARS) 0.5 % SOLN Place 1 drop into both eyes 3 (three) times daily as needed (Dry eye).     Cholecalciferol (VITAMIN D) 125 MCG (5000 UT) CAPS Take 5,000 Int'l Units/L by mouth daily.     Cholecalciferol (VITAMIN D-3) 5000 UNIT/ML LIQD Take by mouth.     cimetidine (TAGAMET) 400 MG tablet Take 400 mg by mouth 2 (two) times daily.     dexamethasone (DECADRON) 4 MG tablet Take 1 tablet (4 mg total) by mouth 2 (two) times daily with a meal. 60 tablet 0   diphenhydrAMINE (BENADRYL) 25 mg capsule Take 25 mg by mouth at bedtime as needed. (Patient not taking: Reported on 01/21/2023)     dipyridamole (PERSANTINE) 75 MG tablet Take 75 mg by mouth 2 (two) times daily.     DULoxetine (CYMBALTA) 30 MG capsule Take 1 capsule (30 mg total) by mouth  daily. 15 capsule 0   etodolac (LODINE) 300 MG capsule Take 300 mg by mouth 2 (two) times daily.     gabapentin (NEURONTIN) 300 MG capsule Take 2 capsules (600 mg) twice daily and 3 capsules (900 mg) at bedtime. 210 capsule 0   Hydroxychloroquine Sulfate 100 MG TABS Take 1 tablet by mouth 2 (two) times daily.     lidocaine-prilocaine (EMLA) cream Apply to affected area once 30 g 3   lovastatin (MEVACOR) 20 MG tablet Take 20 mg by mouth at bedtime.     magnesium oxide (MAG-OX) 400 MG tablet Take 400 mg by mouth daily.     Melatonin 10 MG CAPS Take 10 mg by mouth at bedtime.  metFORMIN (GLUCOPHAGE) 500 MG tablet Take 500 mg by mouth 2 (two) times daily.     minocycline (MINOCIN) 100 MG capsule Take 100 mg by mouth 2 (two) times daily.     NALTREXONE HCL PO Take 3 mg by mouth daily.     nitrofurantoin, macrocrystal-monohydrate, (MACROBID) 100 MG capsule Take 100 mg by mouth every 12 (twelve) hours.     NON FORMULARY Take 3 mg by mouth at bedtime. LOW DOSE NALTROXENE Takes when not taking oxycodone     ondansetron (ZOFRAN) 8 MG tablet Take 1 tablet (8 mg total) by mouth every 8 (eight) hours as needed for nausea or vomiting. Start on the third day after chemotherapy. 30 tablet 1   oxyCODONE-acetaminophen (PERCOCET/ROXICET) 5-325 MG tablet Take 1 tablet by mouth every 8 (eight) hours as needed for severe pain. 60 tablet 0   prochlorperazine (COMPAZINE) 10 MG tablet Take 1 tablet (10 mg total) by mouth every 6 (six) hours as needed for nausea or vomiting. 30 tablet 1   rOPINIRole (REQUIP) 0.25 MG tablet Take 1 tablet (0.25 mg total) by mouth at bedtime. 30 tablet 0   temazepam (RESTORIL) 15 MG capsule Take 1-2 capsules (15-30 mg total) by mouth at bedtime as needed for sleep. 30 capsule 0   thyroid (ARMOUR) 60 MG tablet Take 60 mg by mouth daily before breakfast.     Vitamin D, Ergocalciferol, (DRISDOL) 1.25 MG (50000 UNIT) CAPS capsule Take 50,000 Units by mouth every 7 (seven) days.     No  current facility-administered medications for this visit.    REVIEW OF SYSTEMS:   Constitutional: ( +) fevers, ( - )  chills , ( - ) night sweats Eyes: ( +) blurriness of vision, ( - ) double vision, ( - ) watery eyes Ears, nose, mouth, throat, and face: ( - ) mucositis, ( - ) sore throat Respiratory: ( - ) cough, ( - ) dyspnea, ( - ) wheezes Cardiovascular: ( - ) palpitation, ( - ) chest discomfort, ( - ) lower extremity swelling Gastrointestinal:  ( - ) nausea, ( - ) heartburn, ( - ) change in bowel habits Skin: ( - ) abnormal skin rashes Lymphatics: ( - ) new lymphadenopathy, ( - ) easy bruising Neurological: ( - ) numbness, ( - ) tingling, ( - ) new weaknesses Behavioral/Psych: ( - ) mood change, ( - ) new changes  All other systems were reviewed with the patient and are negative.  PHYSICAL EXAMINATION: ECOG PERFORMANCE STATUS: 1 - Symptomatic but completely ambulatory  Vitals:   01/21/23 1141  BP: 135/74  Pulse: 83  Resp: 20  Temp: (!) 97.5 F (36.4 C)  SpO2: 100%   Filed Weights   01/21/23 1141  Weight: 156 lb 12.8 oz (71.1 kg)    GENERAL: well appearing female in NAD  SKIN: skin color, texture, turgor are normal, no rashes or significant lesions EYES: conjunctiva are pink and non-injected, sclera clear LUNGS: clear to auscultation and percussion with normal breathing effort HEART: regular rate & rhythm and no murmurs and no lower extremity edema Musculoskeletal: no cyanosis of digits and no clubbing  PSYCH: alert & oriented x 3, fluent speech NEURO:  Right eye ptosis. Left LMN facial parsesis.   LABORATORY DATA:  I have reviewed the data as listed    Latest Ref Rng & Units 01/21/2023   11:22 AM 01/17/2023    9:38 PM 01/14/2023    9:33 AM  CBC  WBC 4.0 - 10.5 K/uL  4.9  5.4  9.5   Hemoglobin 12.0 - 15.0 g/dL 40.9  81.1  91.4   Hematocrit 36.0 - 46.0 % 33.6  31.8  35.6   Platelets 150 - 400 K/uL 130  184  242        Latest Ref Rng & Units 01/21/2023    11:22 AM 01/17/2023    9:38 PM 01/14/2023    9:33 AM  CMP  Glucose 70 - 99 mg/dL 782  956  213   BUN 6 - 20 mg/dL 24  25  27    Creatinine 0.44 - 1.00 mg/dL 0.86  5.78  4.69   Sodium 135 - 145 mmol/L 137  134  139   Potassium 3.5 - 5.1 mmol/L 4.0  3.7  4.1   Chloride 98 - 111 mmol/L 105  101  106   CO2 22 - 32 mmol/L 25  25  27    Calcium 8.9 - 10.3 mg/dL 8.9  8.9  9.6   Total Protein 6.5 - 8.1 g/dL 6.2   7.0   Total Bilirubin 0.3 - 1.2 mg/dL 0.3   0.7   Alkaline Phos 38 - 126 U/L 46   50   AST 15 - 41 U/L 21   18   ALT 0 - 44 U/L 26   17    RADIOGRAPHIC STUDIES: I have personally reviewed the radiological images as listed and agreed with the findings in the report. ECHOCARDIOGRAM COMPLETE  Result Date: 01/19/2023    ECHOCARDIOGRAM REPORT   Patient Name:   GIANNA CRANSHAW [facility] Date of Exam: 01/19/2023 Medical Rec #:  [id number]            Height:       69.0 in Accession #:    [id number]           Weight:       153.3 lb Date of Birth:  02/09/1991            BSA:          1.845 m Patient Age:    32 years             BP:           134/82 mmHg Patient Gender: F                    HR:           55 bpm. Exam Location:  Inpatient Procedure: 2D Echo, Cardiac Doppler, Color Doppler, 3D Echo and Strain Analysis Indications:    Chemo Z09  History:        Patient has prior history of Echocardiogram examinations, most                 recent 02/08/2022. Breast cancer (R) breast.  Sonographer:    Lucendia Herrlich RCS Referring Phys: [id number] Serena Croissant IMPRESSIONS  1. Left ventricular ejection fraction, by estimation, is 55 to 60%. The left ventricle has normal function. The left ventricle has no regional wall motion abnormalities. Left ventricular diastolic parameters were normal. The average left ventricular global longitudinal strain is -16.6 %. Suspect GLS underestimated due to suboptimal endocardial tracking. GLS likely in the -18 to -19 range.  2. Right ventricular systolic function is normal. The right  ventricular size is normal.  3. The mitral valve is normal in structure. Trivial mitral valve regurgitation. No evidence of mitral stenosis.  4. The aortic valve is normal in structure. Aortic valve regurgitation is not visualized. No  aortic stenosis is present.  5. The inferior vena cava is normal in size with greater than 50% respiratory variability, suggesting right atrial pressure of 3 mmHg. FINDINGS  Left Ventricle: Left ventricular ejection fraction, by estimation, is 55 to 60%. The left ventricle has normal function. The left ventricle has no regional wall motion abnormalities. The average left ventricular global longitudinal strain is -16.6 %. The left ventricular internal cavity size was normal in size. There is no left ventricular hypertrophy. Left ventricular diastolic parameters were normal. Right Ventricle: The right ventricular size is normal. No increase in right ventricular wall thickness. Right ventricular systolic function is normal. Left Atrium: Left atrial size was normal in size. Right Atrium: Right atrial size was normal in size. Pericardium: There is no evidence of pericardial effusion. Mitral Valve: The mitral valve is normal in structure. Trivial mitral valve regurgitation. No evidence of mitral valve stenosis. Tricuspid Valve: The tricuspid valve is normal in structure. Tricuspid valve regurgitation is trivial. No evidence of tricuspid stenosis. Aortic Valve: The aortic valve is normal in structure. Aortic valve regurgitation is not visualized. No aortic stenosis is present. Aortic valve peak gradient measures 6.2 mmHg. Pulmonic Valve: The pulmonic valve was normal in structure. Pulmonic valve regurgitation is trivial. No evidence of pulmonic stenosis. Aorta: The aortic root is normal in size and structure. Venous: The inferior vena cava is normal in size with greater than 50% respiratory variability, suggesting right atrial pressure of 3 mmHg. IAS/Shunts: No atrial level shunt detected by  color flow Doppler.  LEFT VENTRICLE PLAX 2D LVIDd:         4.40 cm   Diastology LVIDs:         3.10 cm   LV e' medial:    12.70 cm/s LV PW:         0.60 cm   LV E/e' medial:  5.3 LV IVS:        0.60 cm   LV e' lateral:   17.60 cm/s LVOT diam:     2.00 cm   LV E/e' lateral: 3.8 LV SV:         65 LV SV Index:   35        2D Longitudinal Strain LVOT Area:     3.14 cm  2D Strain GLS Avg:     -16.6 %                           3D Volume EF:                          3D EF:        58 %                          LV EDV:       148 ml                          LV ESV:       61 ml                          LV SV:        87 ml RIGHT VENTRICLE             IVC RV S prime:     10.40 cm/s  IVC diam: 1.70  cm TAPSE (M-mode): 2.1 cm LEFT ATRIUM           Index        RIGHT ATRIUM          Index LA diam:      2.80 cm 1.52 cm/m   RA Area:     9.81 cm LA Vol (A2C): 27.8 ml 15.07 ml/m  RA Volume:   18.00 ml 9.75 ml/m LA Vol (A4C): 34.2 ml 18.53 ml/m  AORTIC VALVE AV Area (Vmax): 2.52 cm AV Vmax:        124.00 cm/s AV Peak Grad:   6.2 mmHg LVOT Vmax:      99.40 cm/s LVOT Vmean:     64.300 cm/s LVOT VTI:       0.207 m  AORTA Ao Root diam: 2.70 cm Ao Asc diam:  2.60 cm MITRAL VALVE               TRICUSPID VALVE MV Area (PHT): 3.21 cm    TR Peak grad:   15.5 mmHg MV Decel Time: 236 msec    TR Vmax:        197.00 cm/s MR Peak grad: 64.6 mmHg MR Vmax:      402.00 cm/s  SHUNTS MV E velocity: 67.30 cm/s  Systemic VTI:  0.21 m MV A velocity: 47.10 cm/s  Systemic Diam: 2.00 cm MV E/A ratio:  1.43 Arvilla Meres MD Electronically signed by Arvilla Meres MD Signature Date/Time: 01/19/2023/12:09:58 PM    Final    MR BRAIN W WO CONTRAST  Result Date: 01/18/2023 CLINICAL DATA:  Metastatic breast carcinoma EXAM: MRI HEAD WITHOUT AND WITH CONTRAST MRI CERVICAL SPINE WITHOUT AND WITH CONTRAST TECHNIQUE: Multiplanar, multiecho pulse sequences of the brain and surrounding structures, and cervical spine, to include the craniocervical junction  and cervicothoracic junction, were obtained without and with intravenous contrast. CONTRAST:  7mL GADAVIST GADOBUTROL 1 MMOL/ML IV SOLN COMPARISON:  12/30/2022 brain MRI and 01/04/2019 for spinal MRI FINDINGS: MRI HEAD FINDINGS Brain: There is no acute infarct or acute hemorrhage. Peripherally contrast-enhancing lesion of the left caudate head is unchanged, measuring 2.7 x 2.3 cm (series 16, image 86). The degree of surrounding hyperintense T2-weighted signal is unchanged. There is mass effect on the frontal horn of the left lateral ventricle. No hydrocephalus. No herniation. There are 3 other lesions: 1. Left temporal lobe lesion has increased in size from 3 mm to 7 mm (16:45). 2. Intracanalicular lesion of the left internal auditory canal measures 3 mm, previously 2 mm (16:38). 3. 4 mm interpeduncular lesion (16:53) has slightly increased in size. There is mild contrast enhancement along the cisternal segment of the left trigeminal nerve (16:44). Vascular: Normal flow voids. Skull: Normal calvarium. Sinuses/Orbits:No paranasal sinus fluid levels or advanced mucosal thickening. No mastoid or middle ear effusion. Normal orbits. MRI CERVICAL SPINE FINDINGS Alignment: Reversal of normal cervical lordosis. Vertebrae: No fracture, evidence of discitis, or bone lesion. Cord: Contrast enhancing lesions at C2 and T3 are unchanged, allowing for differences in technique. No new spinal cord lesions are identified. Posterior Fossa, vertebral arteries, paraspinal tissues: Normal vertebral artery flow voids. No prevertebral abnormality. Disc levels: C4-5 right asymmetric disc bulge with mild right foraminal stenosis. The other disc levels are unremarkable. IMPRESSION: 1. Increased size of 3 intracranial metastases, including the left temporal lobe, left internal auditory canal and interpeduncular midbrain. 2. Unchanged size of left caudate head metastasis. 3. 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 4. 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 Electronically Signed   By: Deatra Robinson M.D.   On: 01/18/2023 00:52   MR Cervical Spine W and Wo Contrast  Result Date: 01/18/2023 CLINICAL DATA:  Metastatic breast carcinoma EXAM: MRI HEAD WITHOUT AND WITH CONTRAST MRI CERVICAL SPINE WITHOUT AND WITH CONTRAST TECHNIQUE: Multiplanar, multiecho pulse sequences of the brain and surrounding structures, and cervical spine, to include the craniocervical junction and cervicothoracic junction, were obtained without and with intravenous contrast. CONTRAST:  7mL GADAVIST GADOBUTROL 1 MMOL/ML IV SOLN COMPARISON:  12/30/2022 brain MRI and 01/04/2019 for spinal MRI FINDINGS: MRI HEAD FINDINGS Brain: There is no acute infarct or acute hemorrhage. Peripherally contrast-enhancing lesion of the left caudate head is unchanged, measuring 2.7 x 2.3 cm (series 16, image 86). The degree of surrounding hyperintense T2-weighted signal is unchanged. There is mass effect on the frontal horn of the left lateral ventricle. No hydrocephalus. No herniation. There are 3 other lesions: 1. Left temporal lobe lesion has increased in size from 3 mm to 7 mm (16:45). 2. Intracanalicular lesion of the left internal auditory canal measures 3 mm, previously 2 mm (16:38). 3. 4 mm interpeduncular lesion (16:53) has slightly increased in size. There is mild contrast enhancement along the cisternal segment of the left trigeminal nerve (16:44). Vascular: Normal flow voids. Skull: Normal calvarium. Sinuses/Orbits:No paranasal sinus fluid levels or advanced mucosal thickening. No mastoid or middle ear effusion. Normal orbits. MRI CERVICAL SPINE FINDINGS Alignment: Reversal of normal cervical lordosis. Vertebrae: No fracture, evidence of discitis, or bone lesion. Cord: Contrast enhancing lesions at C2 and T3 are unchanged, allowing for differences in technique. No new spinal cord  lesions are identified. Posterior Fossa, vertebral arteries, paraspinal tissues: Normal vertebral artery flow voids. No prevertebral abnormality. Disc levels: C4-5 right asymmetric disc bulge with mild right foraminal stenosis. The other disc levels are unremarkable. IMPRESSION: 1. Increased size of 3 intracranial metastases, including the left temporal lobe, left internal auditory canal and interpeduncular midbrain. 2. Unchanged size of left caudate head metastasis. 3. 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 4. 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 Electronically Signed   By: Deatra Robinson M.D.   On: 01/18/2023 00:52   CT Head Wo Contrast  Result Date: 01/17/2023 CLINICAL DATA:  Monitoring of head and neck cancer. EXAM: CT HEAD WITHOUT CONTRAST TECHNIQUE: Contiguous axial images were obtained from the base of the skull through the vertex without intravenous contrast. RADIATION DOSE REDUCTION: This exam was performed according to the departmental dose-optimization program which includes automated exposure control, adjustment of the mA and/or kV according to patient size and/or use of iterative reconstruction technique. COMPARISON:  MRI brain 12/30/2022 FINDINGS: Brain: There is a space-occupying lesion in the left frontal white matter measuring about 2.9 cm in diameter. There is adjacent vasogenic edema. This causes mass effect with effacement of the anterior horn of the left lateral ventricle and left-to-right midline shift of about 4 mm. No significant change in appearance in comparison to the previous MRI. Central increased attenuation within the mass may represent hemorrhage or calcification. No additional lesions are identified. No abnormal extra-axial fluid collections. Basal cisterns are not effaced. Vascular: No hyperdense vessel or unexpected calcification. Skull: Normal. Negative for fracture or  focal lesion. Sinuses/Orbits: Paranasal sinuses and mastoid air cells are clear. Other: None. IMPRESSION: 1. 2.9 cm diameter space-occupying lesion in the left  frontal white matter with associated vasogenic edema and mass-effect as described. Central increased attenuation could indicate calcification or internal hemorrhage. The lesion appears similar to the prior MRI, allowing for differences in modality. 2. No additional lesions are identified. MRI would be more sensitive for evaluation of metastatic intracranial process if clinically indicated. Electronically Signed   By: Burman Nieves M.D.   On: 01/17/2023 19:13   MR TOTAL SPINE METS SCREENING  Result Date: 01/04/2023 CLINICAL DATA:  Metastatic breast cancer. Neck and back pain with numbness in the left foot. Cervical spinal cord lesion seen on recent head MRI. EXAM: MRI TOTAL SPINE WITHOUT AND WITH CONTRAST TECHNIQUE: Multisequence MR imaging of the spine from the cervical spine to the sacrum was performed prior to and following IV contrast administration for evaluation of spinal metastatic disease. CONTRAST:  7mL GADAVIST GADOBUTROL 1 MMOL/ML IV SOLN COMPARISON:  Head MRI 12/30/2022 FINDINGS: Axial imaging is motion degraded. MRI CERVICAL SPINE FINDINGS Alignment: Reversal of the normal cervical lordosis.  No listhesis. Vertebrae: No fracture, suspicious marrow lesion, or significant marrow edema. Cord: 6 mm solidly enhancing intramedullary lesion in the right ventral spinal cord at C2 with mild surrounding edema as seen on the recent prior head MRI. Posterior Fossa, vertebral arteries, paraspinal tissues: No metastasis in the left basal ganglia with associated edema, more fully evaluated on the recent head MRI. Disc levels: Mild spondylosis with mild spinal stenosis at C4-5 due to disc bulging. MRI THORACIC SPINE FINDINGS Alignment:  Normal. Vertebrae: No fracture, suspicious marrow lesion, or significant marrow edema. Cord: 3 mm enhancing lesion in the  left dorsal cord at T3. 5 mm enhancing lesion in the left dorsal cord at T5. Paraspinal and other soft tissues: Unremarkable. Disc levels: No significant degenerative changes. MRI LUMBAR SPINE FINDINGS Segmentation:  Standard. Alignment:  Mild lumbar levoscoliosis.  No listhesis. Vertebrae: No fracture, suspicious marrow lesion, or significant marrow edema. Conus medullaris: Extends to the L1-2 level and appears normal. Suspected punctate enhancing foci along the cauda equina on the right at L1 (series 16, image 6), in the midline at L2 (series 16, image 8), and on the left at L4 (series 16, image 8), however motion artifact on axial imaging limits confirmation. Paraspinal and other soft tissues: Unremarkable. Disc levels: Minor spondylosis without evidence of significant stenosis. IMPRESSION: Three small enhancing lesions in the cervical and thoracic spinal cord and suspected punctate lesions along the cauda equina consistent with metastases. Mild cord edema associated with the C2 lesion. Electronically Signed   By: Sebastian Ache M.D.   On: 01/04/2023 18:22   MR Brain W Wo Contrast  Result Date: 12/30/2022 CLINICAL DATA:  Breast cancer with metastatic disease EXAM: MRI HEAD WITHOUT AND WITH CONTRAST TECHNIQUE: Multiplanar, multiecho pulse sequences of the brain and surrounding structures were obtained without and with intravenous contrast. CONTRAST:  7mL GADAVIST GADOBUTROL 1 MMOL/ML IV SOLN COMPARISON:  Brain MRI 09/15/2022 FINDINGS: Brain: The 2.8 cm x 2.2 cm x 2.3 cm peripherally enhancing lesion centered in the left caudate head is decreased in size from 3.5 cm x 3.2 cm x 3.3 cm on the prior study. There is persistent enhancement along the ependymal lining of the left frontal horn (16-101). Surrounding vasogenic edema in the left frontal lobe extending medially across the genu of the corpus callosum and inferiorly to the internal and external capsules is also improved, with resolved signal abnormality in the  midbrain. There is improved regional mass effect with persistent effacement of the left temporal horn  and trace rightward midline shift, decreased from 1.0 cm on the prior study. There are no new intracranial lesions. There is no acute intracranial hemorrhage, extra-axial fluid collection, or acute infarct. Parenchymal volume is normal. The ventricles are otherwise normal in size. Parenchymal signal is otherwise normal. The pituitary and suprasellar region are normal. Vascular: Normal flow voids. Skull and upper cervical spine: There is no suspicious marrow signal abnormality. There is a 4 mm enhancing lesion in the cervical cord at the C2 level to the right of midline, new since the prior study. Sinuses/Orbits: The paranasal sinuses are clear. The globes and orbits are unremarkable. Other: The mastoid air cells and middle ear cavities are clear. IMPRESSION: Since 09/15/2022: 1. Decreased size of the enhancing metastatic lesion centered in the left caudate head with improved surrounding edema and essentially resolved rightward midline shift. There is persistent enhancement along the ependymal lining of the left frontal horn. 2. 4 mm enhancing lesion in the cervical cord at the C2 level is new from the prior study. Recommend dedicated imaging of the total spine with and without contrast to evaluate for additional intramedullary metastatic lesions. Electronically Signed   By: Lesia Hausen M.D.   On: 12/30/2022 15:23    ASSESSMENT & PLAN Nikitha Jackleen Holloway is a 32 y.o. female who presents for continued management of metastatic breast cancer.   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 Started whole brain radiation on 01/19/2023. --Started Enhertu therapy on 01/14/2023 --Toxicities include fatigue, nausea, diarrhea/constipation --Labs from today were reviewed and require no intervention. WBC 4.9, Hgb 11.8, Plt 130K, Creatinine and LFTs in range.   --Patient will return on 02/04/2023 for labs and toxicity evaluation prior to Cycle 2, Day 1.   #Nausea: --Recommend to take zofran scheduled q 8 hours. If there is breakthrough nausea, then alternative zofran and compazine every 4 hours.   #Diarrhea versus Constipation: --Continue to managed with OTC supportive meds including stool softeners and imodium --Encouraged patient to stay hydrated.   #Fatigue: --Likely secondary to underlying malignancy and treatment --Checked vitamin D levels, pending results. Currently takes vitamin D 10,000 units daily.  No orders of the defined types were placed in this encounter.   All questions were answered. The patient knows to call the clinic with any problems, questions or concerns.  I have spent a total of 30 minutes minutes of face-to-face and non-face-to-face time, preparing to see the patient, performing a medically appropriate examination, counseling and educating the patient, documenting clinical information in the electronic health record, independently interpreting results and communicating results to the patient, and care coordination.   Georga Kaufmann, PA-C Department of Hematology/Oncology [facility] at [facility] Phone: [phone]

## 2023-01-21 NOTE — Telephone Encounter (Signed)
This RN called pt to inform of appt 11/5 8 am with palliative care following radiation tx. Pt verbalized understanding.

## 2023-01-23 IMAGING — MR MR BREAST BILAT WO/W CM
8 of 11 series · 25 of 48 positions shown · IV contrast (7ml GADAVIST)
Comparison: Previous exams.

CLINICAL DATA: 31-year-old female with invasive ductal carcinoma of
the right breast with right axillary lymph node metastases diagnosed
March 2021 presents for follow-up breast MRI following neoadjuvant
chemotherapy.
TECHNIQUE: Multiplanar, multisequence MR images of both breasts were obtained
prior to and following the intravenous administration of 7 ml of
Gadavist

[Series 2: T2 · axial · 3.0mm · 0.86mm/px · 1 of 52 slices shown]
[im 1/52]
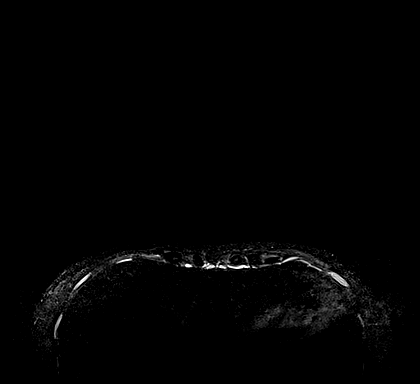

[Series 3: T1 fat-sat · axial · 1.2mm · 0.74mm/px · z∈[-89,+82]mm · 5 of 144 slices shown (1 of 4)]
[im 1/144]
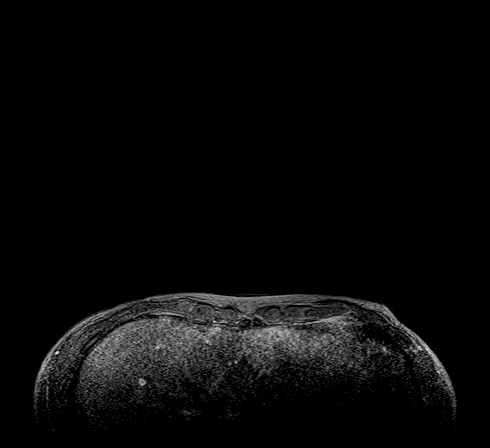
[im 36/144]
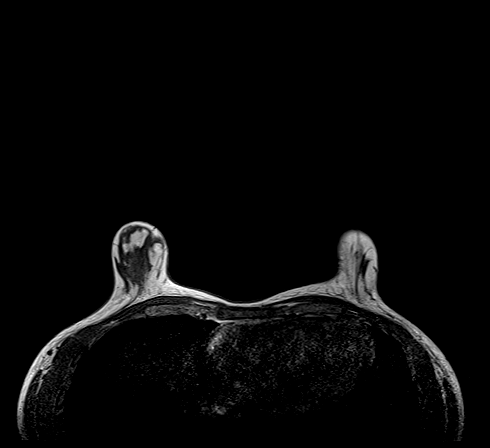
[im 72/144]
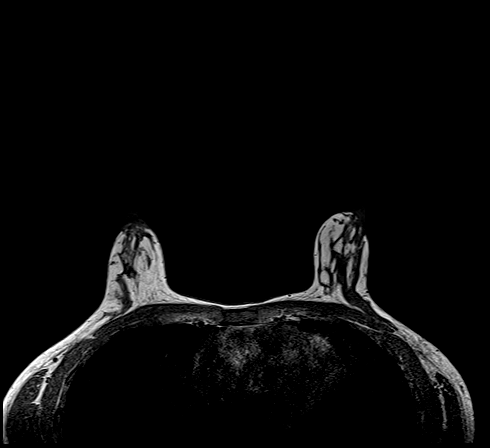
[im 108/144]
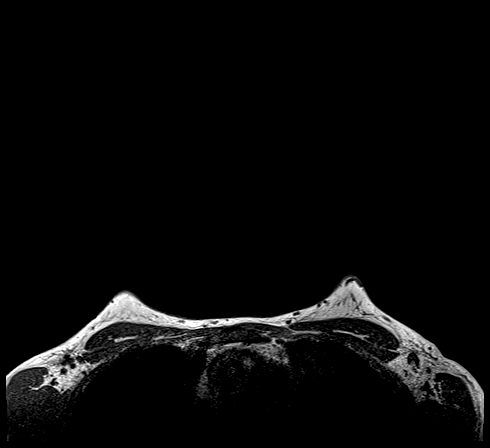
[im 144/144]
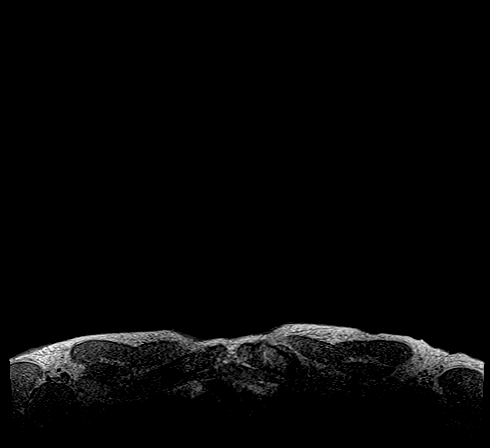

[Series 5: T1 fat-sat · axial · 1.2mm · 0.79mm/px · z∈[-94,+77]mm · 5 of 144 slices shown (2 of 4)]
[im 1/144]
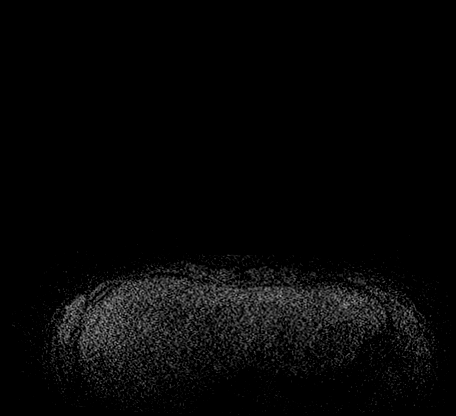
[im 36/144]
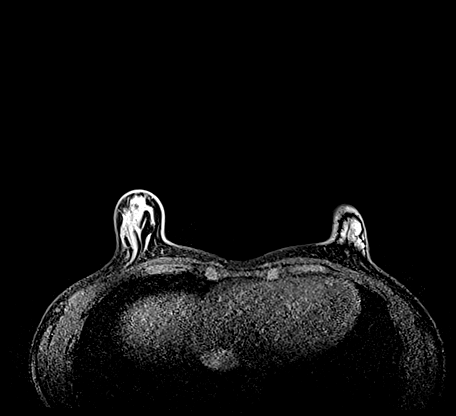
[im 72/144]
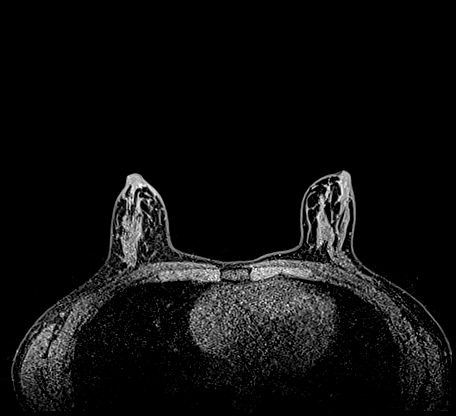
[im 108/144]
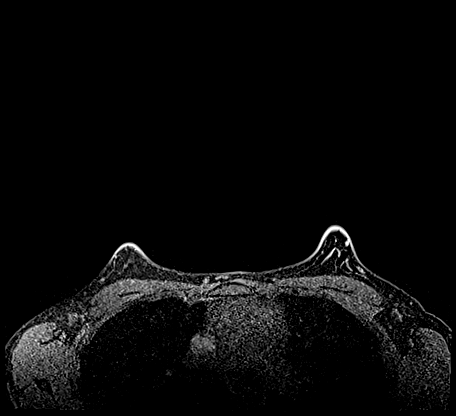
[im 144/144]
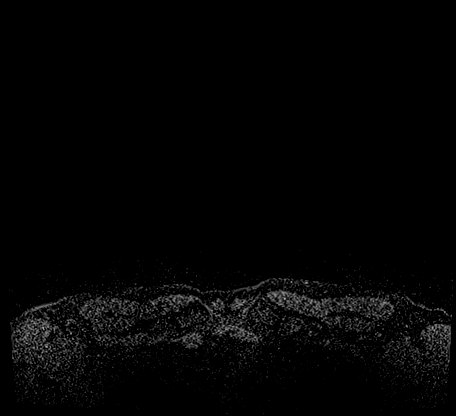

[Series 6: T1 fat-sat · axial · 1.2mm · 0.79mm/px · z∈[-94,+77]mm · 5 of 144 slices shown (3 of 4)]
[im 1/144]
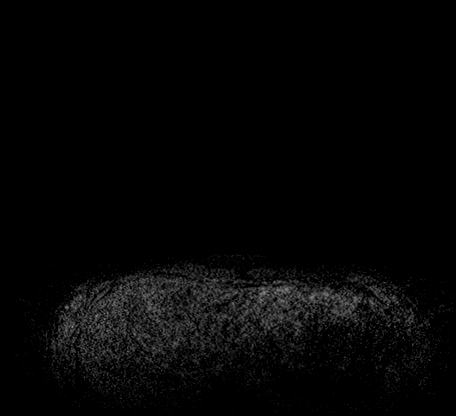
[im 36/144]
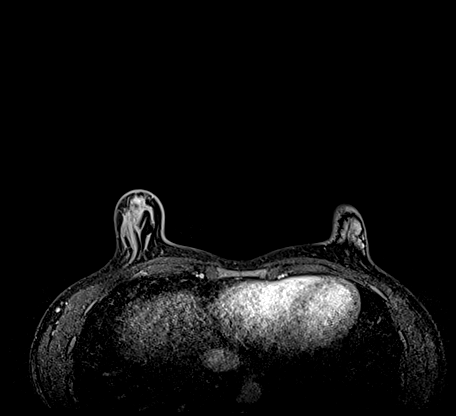
[im 72/144]
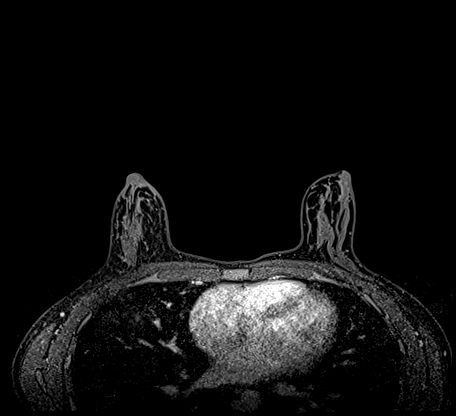
[im 108/144]
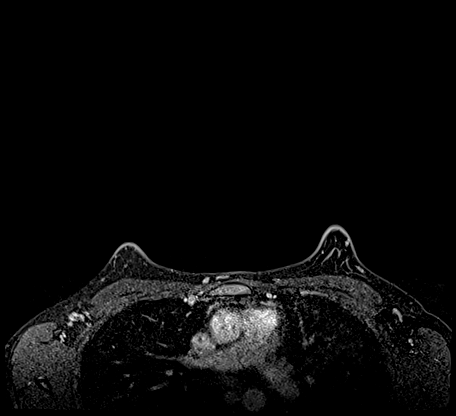
[im 144/144]
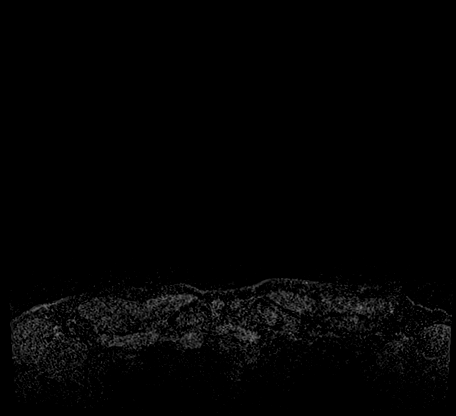

[Series 7: T1 · axial · 1.2mm · 0.79mm/px · z∈[-94,+77]mm · 6 of 144 slices shown (1 of 3)]
[im 1/144]
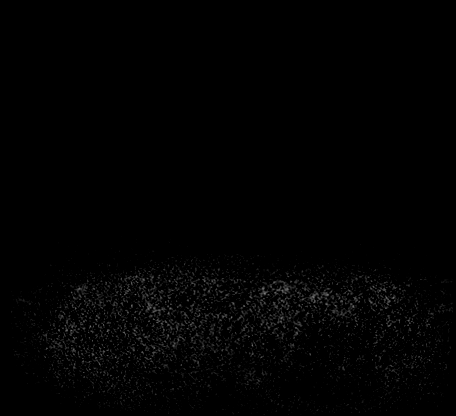
[im 29/144]
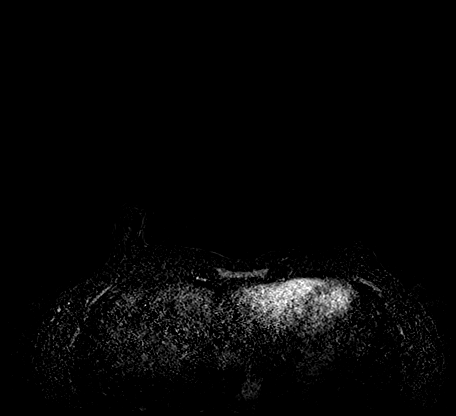
[im 58/144]
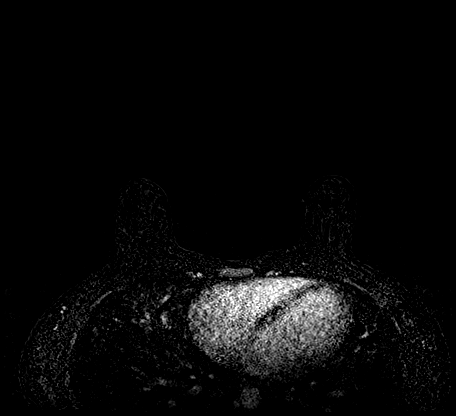
[im 86/144]
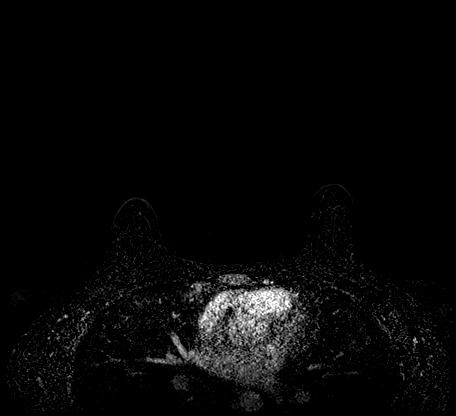
[im 115/144]
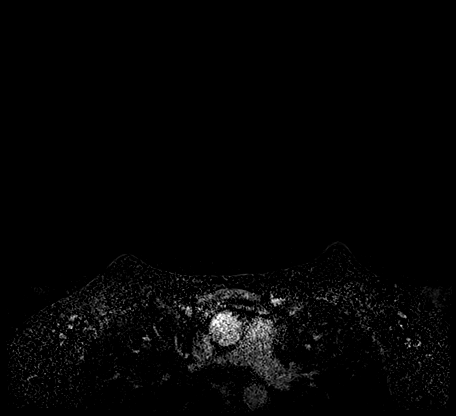
[im 144/144]
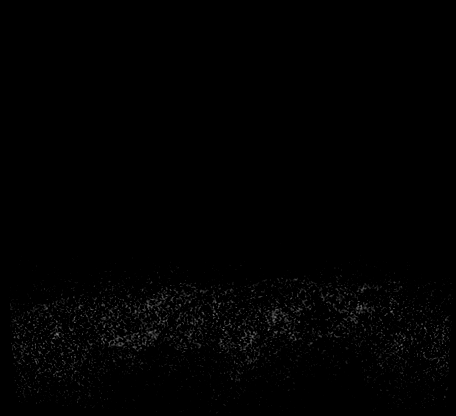

[Series 8: T1 · coronal · 330.0mm · 0.79mm/px · 1 of 3 slices shown (2 of 3)]
[im 1/3]
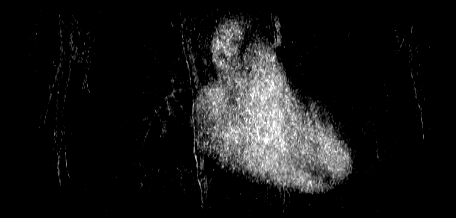

[Series 9: T1 · axial · 172.8mm · 0.79mm/px · 1 of 3 slices shown (3 of 3)]
[im 1/3]
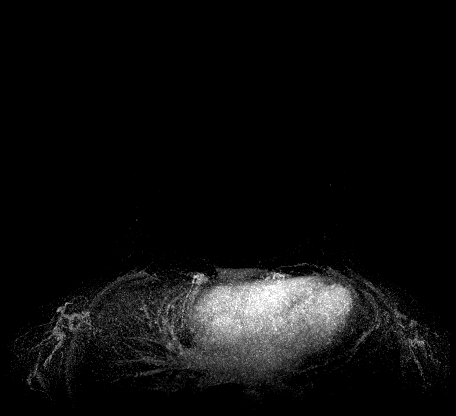

[Series 10: T1 fat-sat · axial · 1.2mm · 0.79mm/px · 1 of 144 slices shown (4 of 4)]
[im 1/144]
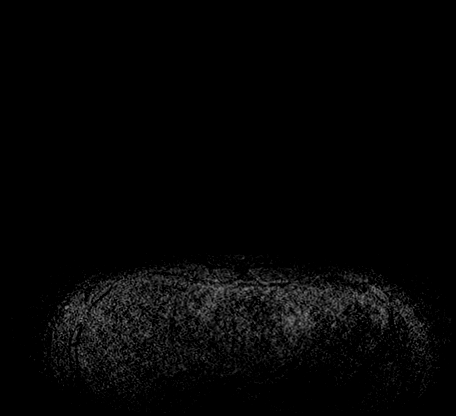

[25 of 48 positions shown; findings below may reference images not displayed]

Breast MRI dated 04/23/2021 demonstrated extensive non mass
enhancement throughout all quadrants of the right breast with the
largest confluent area of enhancement measuring 2.9 cm. This
included abnormal enhancement of the right nipple with linear non
mass enhancement extending posterior to the nipple and an abnormal
1.9 cm right axillary lymph node containing biopsy marking clip.

EXAM:
BILATERAL BREAST MRI WITH AND WITHOUT CONTRAST
Three-dimensional MR images were rendered by post-processing of the
original MR data on an independent workstation. The
three-dimensional MR images were interpreted, and findings are
reported in the following complete MRI report for this study. Three
dimensional images were evaluated at the independent interpreting
workstation using the DynaCAD thin client.
FINDINGS: Breast composition: d.  Extreme fibroglandular tissue.

Background parenchymal enhancement: Minimal.

Right breast: Interval resolution of the previously seen extensive
clumped non mass enhancement throughout the right breast. There are
no new abnormal areas of enhancement or suspicious enhancing masses
identified in the right breast.

Left breast: No mass or abnormal enhancement.

Lymph nodes: The previously seen enlarged and enhancing right
axillary lymph node with associated biopsy marking clip artifact now
measures 0.8 cm, previously 1.9 cm. No new abnormal lymph nodes
identified in the right axilla.

Ancillary findings:  None.
IMPRESSION: 1. No abnormal enhancement identified in the right breast;
previously seen extensive clumped non mass enhancement throughout
the right breast has resolved in the interval compatible with
treatment response.

2. Decreased size/normalization of metastatic right axillary lymph
node, now measuring 0.8 cm, previously 1.9 cm. No new abnormal lymph
nodes identified in the right axilla.

RECOMMENDATION:
Treatment plan for known right breast malignancy.

BI-RADS CATEGORY  6: Known biopsy-proven malignancy.

## 2023-01-24 ENCOUNTER — Ambulatory Visit
Admission: RE | Admit: 2023-01-24 | Discharge: 2023-01-24 | Disposition: A | Discharge status: Home / Self Care | Payer: Commercial Managed Care - PPO | Source: Ambulatory Visit | Attending: Radiation Oncology | Admitting: Radiation Oncology

## 2023-01-24 ENCOUNTER — Other Ambulatory Visit: Payer: Self-pay | Admitting: Radiation Oncology

## 2023-01-24 ENCOUNTER — Other Ambulatory Visit: Payer: Self-pay

## 2023-01-24 ENCOUNTER — Ambulatory Visit: Payer: Commercial Managed Care - PPO

## 2023-01-24 DIAGNOSIS — Z510 Encounter for antineoplastic radiation therapy: Secondary | ICD-10-CM | POA: Diagnosis not present

## 2023-01-24 DIAGNOSIS — C7931 Secondary malignant neoplasm of brain: Secondary | ICD-10-CM

## 2023-01-24 LAB — RAD ONC ARIA SESSION SUMMARY
Course Elapsed Days: 12
Plan Fractions Treated to Date: 4
Plan Fractions Treated to Date: 9
Plan Prescribed Dose Per Fraction: 2.5 Gy
Plan Prescribed Dose Per Fraction: 3 Gy
Plan Total Fractions Prescribed: 10
Plan Total Fractions Prescribed: 6
Plan Total Prescribed Dose: 15 Gy
Plan Total Prescribed Dose: 30 Gy
Reference Point Dosage Given to Date: 10 Gy
Reference Point Dosage Given to Date: 27 Gy
Reference Point Session Dosage Given: 2.5 Gy
Reference Point Session Dosage Given: 3 Gy
Session Number: 9

## 2023-01-24 MED ORDER — DEXAMETHASONE 4 MG PO TABS: 4.0000 mg | ORAL_TABLET | Freq: Two times a day (BID) | ORAL | 0 refills | Status: DC

## 2023-01-25 ENCOUNTER — Ambulatory Visit
Admission: RE | Admit: 2023-01-25 | Discharge: 2023-01-25 | Disposition: A | Discharge status: Home / Self Care | Payer: Commercial Managed Care - PPO | Source: Ambulatory Visit | Attending: Radiation Oncology

## 2023-01-25 ENCOUNTER — Other Ambulatory Visit: Payer: Self-pay

## 2023-01-25 ENCOUNTER — Ambulatory Visit: Payer: Commercial Managed Care - PPO

## 2023-01-25 DIAGNOSIS — Z510 Encounter for antineoplastic radiation therapy: Secondary | ICD-10-CM | POA: Diagnosis not present

## 2023-01-25 LAB — RAD ONC ARIA SESSION SUMMARY
Course Elapsed Days: 13
Plan Fractions Treated to Date: 10
Plan Fractions Treated to Date: 5
Plan Prescribed Dose Per Fraction: 2.5 Gy
Plan Prescribed Dose Per Fraction: 3 Gy
Plan Total Fractions Prescribed: 10
Plan Total Fractions Prescribed: 6
Plan Total Prescribed Dose: 15 Gy
Plan Total Prescribed Dose: 30 Gy
Reference Point Dosage Given to Date: 12.5 Gy
Reference Point Dosage Given to Date: 30 Gy
Reference Point Session Dosage Given: 2.5 Gy
Reference Point Session Dosage Given: 3 Gy
Session Number: 10

## 2023-01-26 ENCOUNTER — Ambulatory Visit
Admission: RE | Admit: 2023-01-26 | Discharge: 2023-01-26 | Disposition: A | Discharge status: Home / Self Care | Payer: Commercial Managed Care - PPO | Source: Ambulatory Visit | Attending: Radiation Oncology

## 2023-01-26 ENCOUNTER — Other Ambulatory Visit: Payer: Self-pay | Admitting: Hematology and Oncology

## 2023-01-26 ENCOUNTER — Other Ambulatory Visit: Payer: Self-pay

## 2023-01-26 ENCOUNTER — Encounter: Payer: Self-pay | Admitting: Hematology and Oncology

## 2023-01-26 DIAGNOSIS — Z510 Encounter for antineoplastic radiation therapy: Secondary | ICD-10-CM | POA: Diagnosis not present

## 2023-01-26 LAB — RAD ONC ARIA SESSION SUMMARY
Course Elapsed Days: 14
Plan Fractions Treated to Date: 6
Plan Prescribed Dose Per Fraction: 2.5 Gy
Plan Total Fractions Prescribed: 6
Plan Total Prescribed Dose: 15 Gy
Reference Point Dosage Given to Date: 15 Gy
Reference Point Session Dosage Given: 2.5 Gy
Session Number: 11

## 2023-01-26 MED ORDER — DIPHENOXYLATE-ATROPINE 2.5-0.025 MG PO TABS: 1.0000 | ORAL_TABLET | Freq: Four times a day (QID) | ORAL | 3 refills | Status: DC | PRN

## 2023-01-27 ENCOUNTER — Ambulatory Visit
Admission: RE | Admit: 2023-01-27 | Discharge: 2023-01-27 | Disposition: A | Discharge status: Home / Self Care | Payer: Commercial Managed Care - PPO | Source: Ambulatory Visit | Attending: Radiation Oncology | Admitting: Radiation Oncology

## 2023-01-27 ENCOUNTER — Other Ambulatory Visit: Payer: Self-pay

## 2023-01-27 ENCOUNTER — Telehealth: Payer: Self-pay

## 2023-01-27 DIAGNOSIS — Z510 Encounter for antineoplastic radiation therapy: Secondary | ICD-10-CM | POA: Diagnosis not present

## 2023-01-27 LAB — RAD ONC ARIA SESSION SUMMARY
Course Elapsed Days: 15
Plan Fractions Treated to Date: 1
Plan Prescribed Dose Per Fraction: 2.5 Gy
Plan Total Fractions Prescribed: 8
Plan Total Prescribed Dose: 20 Gy
Reference Point Dosage Given to Date: 2.5 Gy
Reference Point Session Dosage Given: 2.5 Gy
Session Number: 12

## 2023-01-27 NOTE — Telephone Encounter (Signed)
S/w pt regarding concerns via MyChart message . She was advised to alternate imodium and prescribed Lomotil as ordered, as well as increase water intake. She was advised to call 01/28/23 if this does not help.

## 2023-01-28 ENCOUNTER — Telehealth: Payer: Self-pay

## 2023-01-28 ENCOUNTER — Other Ambulatory Visit: Payer: Self-pay

## 2023-01-28 ENCOUNTER — Other Ambulatory Visit: Payer: Self-pay | Admitting: Physician Assistant

## 2023-01-28 ENCOUNTER — Inpatient Hospital Stay: Payer: Commercial Managed Care - PPO | Attending: Adult Health

## 2023-01-28 ENCOUNTER — Ambulatory Visit
Admission: RE | Admit: 2023-01-28 | Discharge: 2023-01-28 | Disposition: A | Discharge status: Home / Self Care | Payer: Commercial Managed Care - PPO | Source: Ambulatory Visit | Attending: Radiation Oncology | Admitting: Radiation Oncology

## 2023-01-28 VITALS — BP 115/73 | HR 65 | Temp 97.5°F | Resp 18

## 2023-01-28 DIAGNOSIS — Z510 Encounter for antineoplastic radiation therapy: Secondary | ICD-10-CM | POA: Insufficient documentation

## 2023-01-28 DIAGNOSIS — Z9221 Personal history of antineoplastic chemotherapy: Secondary | ICD-10-CM | POA: Diagnosis not present

## 2023-01-28 DIAGNOSIS — C7931 Secondary malignant neoplasm of brain: Secondary | ICD-10-CM | POA: Insufficient documentation

## 2023-01-28 DIAGNOSIS — C50411 Malignant neoplasm of upper-outer quadrant of right female breast: Secondary | ICD-10-CM | POA: Insufficient documentation

## 2023-01-28 DIAGNOSIS — Z79899 Other long term (current) drug therapy: Secondary | ICD-10-CM | POA: Diagnosis not present

## 2023-01-28 DIAGNOSIS — Z171 Estrogen receptor negative status [ER-]: Secondary | ICD-10-CM | POA: Diagnosis not present

## 2023-01-28 DIAGNOSIS — Z95828 Presence of other vascular implants and grafts: Secondary | ICD-10-CM

## 2023-01-28 LAB — RAD ONC ARIA SESSION SUMMARY
Course Elapsed Days: 16
Plan Fractions Treated to Date: 2
Plan Prescribed Dose Per Fraction: 2.5 Gy
Plan Total Fractions Prescribed: 8
Plan Total Prescribed Dose: 20 Gy
Reference Point Dosage Given to Date: 5 Gy
Reference Point Session Dosage Given: 2.5 Gy
Session Number: 13

## 2023-01-28 MED ORDER — SODIUM CHLORIDE 0.9% FLUSH
10.0000 mL | Freq: Once | INTRAVENOUS | Status: AC
Start: 2023-01-28 — End: 2023-01-28
  Administered 2023-01-28: 10 mL

## 2023-01-28 MED ORDER — SODIUM CHLORIDE 0.9 % IV SOLN: Freq: Once | INTRAVENOUS | Status: AC

## 2023-01-28 MED ORDER — HEPARIN SOD (PORK) LOCK FLUSH 100 UNIT/ML IV SOLN
500.0000 [IU] | Freq: Once | INTRAVENOUS | Status: AC
  Administered 2023-01-28: 500 [IU]

## 2023-01-28 NOTE — Telephone Encounter (Signed)
S/w pt per MyChart message. She reports she is here for xrt and is looking to get IVF today. XRT staff is attempting to help pt with need for IVF. We discussed having pt come in to see Lillard Anes, NP Monday, but since pt is going to have IVF today, we will postpone that. She will call or send a message with any concerns.

## 2023-01-31 ENCOUNTER — Encounter: Payer: Self-pay | Admitting: Hematology and Oncology

## 2023-01-31 ENCOUNTER — Ambulatory Visit: Payer: Commercial Managed Care - PPO

## 2023-01-31 ENCOUNTER — Other Ambulatory Visit: Payer: Self-pay

## 2023-01-31 ENCOUNTER — Ambulatory Visit (HOSPITAL_COMMUNITY)
Admission: RE | Admit: 2023-01-31 | Discharge: 2023-01-31 | Disposition: A | Discharge status: Home / Self Care | Payer: Commercial Managed Care - PPO | Source: Ambulatory Visit | Attending: Physician Assistant | Admitting: Physician Assistant

## 2023-01-31 ENCOUNTER — Telehealth: Payer: Self-pay

## 2023-01-31 ENCOUNTER — Ambulatory Visit
Admission: RE | Admit: 2023-01-31 | Discharge: 2023-01-31 | Disposition: A | Discharge status: Home / Self Care | Payer: Commercial Managed Care - PPO | Source: Ambulatory Visit | Attending: Radiation Oncology

## 2023-01-31 DIAGNOSIS — M7989 Other specified soft tissue disorders: Secondary | ICD-10-CM | POA: Insufficient documentation

## 2023-01-31 DIAGNOSIS — R197 Diarrhea, unspecified: Secondary | ICD-10-CM | POA: Insufficient documentation

## 2023-01-31 DIAGNOSIS — C50411 Malignant neoplasm of upper-outer quadrant of right female breast: Secondary | ICD-10-CM | POA: Insufficient documentation

## 2023-01-31 DIAGNOSIS — Z171 Estrogen receptor negative status [ER-]: Secondary | ICD-10-CM

## 2023-01-31 LAB — RAD ONC ARIA SESSION SUMMARY
Course Elapsed Days: 19
Plan Fractions Treated to Date: 3
Plan Prescribed Dose Per Fraction: 2.5 Gy
Plan Total Fractions Prescribed: 8
Plan Total Prescribed Dose: 20 Gy
Reference Point Dosage Given to Date: 7.5 Gy
Reference Point Session Dosage Given: 2.5 Gy
Session Number: 14

## 2023-01-31 NOTE — Telephone Encounter (Signed)
S/w pt per MyChart message. She reports via phone that she is experiencing new RLE edema as well. She denies redness, pain, heat or pitting. She has been scheduled for a RLE doppler to r/o DVT per PA and she will see [facility] tomorrow 11/5 at 0900. She verbalized agreement of all and is thankful.

## 2023-01-31 NOTE — Progress Notes (Unsigned)
Palliative Medicine [facility]  Telephone:([phone]) [phone] Fax:(336) [phone]   Name: Tara White Date: 01/31/2023 MRN: [id number]  DOB: 09-24-1990  Patient Care Team: Samuella Bruin as PCP - General (Physician Assistant) Serena Croissant, MD as Consulting Physician (Hematology and Oncology) Abigail Miyamoto, MD as Consulting Physician (General Surgery) Lonie Peak, MD as Attending Physician (Radiation Oncology) Pickenpack-Cousar, Arty Baumgartner, NP as Nurse Practitioner (Hospice and Palliative Medicine)    INTERVAL HISTORY: Tara White is a 32 y.o. 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 Palliative ask to see for symptom management and goals of care.   SOCIAL HISTORY:     reports that she has never smoked. She has never used smokeless tobacco. She reports current alcohol use of about 2.0 standard drinks of alcohol per week. She reports that she does not use drugs.  ADVANCE DIRECTIVES:  None on file  CODE STATUS: Full code  PAST MEDICAL HISTORY: Past Medical History:  Diagnosis Date  . 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 Family history of breast cancer 04/21/2021  . right breast ca 03/2021    ALLERGIES:  has No Known Allergies.  MEDICATIONS:  Current Outpatient Medications  Medication Sig Dispense Refill  . ascorbic acid (VITAMIN C) 1000 MG tablet Take 1,000 mg by mouth daily.    . carboxymethylcellulose (REFRESH TEARS) 0.5 % SOLN Place 1 drop into both eyes 3 (three) times daily as needed (Dry eye).    . Cholecalciferol (VITAMIN D) 125 MCG (5000 UT) CAPS Take 5,000 Int'l Units/L by mouth daily.    . Cholecalciferol (VITAMIN D-3) 5000 UNIT/ML LIQD Take by mouth.    . cimetidine (TAGAMET) 400 MG tablet Take 400 mg by mouth 2 (two) times daily.    Marland Kitchen dexamethasone  (DECADRON) 4 MG tablet Take 1 tablet (4 mg total) by mouth 2 (two) times daily with a meal. 60 tablet 0  . diphenhydrAMINE (BENADRYL) 25 mg capsule Take 25 mg by mouth at bedtime as needed.    . diphenoxylate-atropine (LOMOTIL) 2.5-0.025 MG tablet Take 1 tablet by mouth 4 (four) times daily as needed for diarrhea or loose stools. 30 tablet 3  . dipyridamole (PERSANTINE) 75 MG tablet Take 75 mg by mouth 2 (two) times daily.    . DULoxetine (CYMBALTA) 30 MG capsule Take 1 capsule (30 mg total) by mouth daily. 15 capsule 0  . etodolac (LODINE) 300 MG capsule Take 300 mg by mouth 2 (two) times daily.    Marland Kitchen gabapentin (NEURONTIN) 300 MG capsule Take 2 capsules (600 mg) twice daily and 3 capsules (900 mg) at bedtime. 210 capsule 0  . Hydroxychloroquine Sulfate 100 MG TABS Take 1 tablet by mouth 2 (two) times daily.    Marland Kitchen lidocaine-prilocaine (EMLA) cream Apply to affected area once 30 g 3  . lovastatin (MEVACOR) 20 MG tablet Take 20 mg by mouth at bedtime.    . magnesium oxide (MAG-OX) 400 MG tablet Take 400 mg by mouth daily.    . Melatonin 10 MG CAPS Take 10 mg by mouth at bedtime.    . metFORMIN (GLUCOPHAGE) 500 MG tablet Take 500 mg by mouth 2 (two) times daily.    . minocycline (MINOCIN) 100 MG capsule Take 100 mg by mouth 2 (two) times daily.    Marland Kitchen NALTREXONE HCL PO Take 3 mg by mouth daily.    Marland Kitchen  nitrofurantoin, macrocrystal-monohydrate, (MACROBID) 100 MG capsule Take 100 mg by mouth every 12 (twelve) hours.    . NON FORMULARY Take 3 mg by mouth at bedtime. LOW DOSE NALTROXENE Takes when not taking oxycodone    . ondansetron (ZOFRAN) 8 MG tablet Take 1 tablet (8 mg total) by mouth every 8 (eight) hours as needed for nausea or vomiting. Start on the third day after chemotherapy. 30 tablet 1  . oxyCODONE-acetaminophen (PERCOCET/ROXICET) 5-325 MG tablet Take 1 tablet by mouth every 8 (eight) hours as needed for severe pain. 60 tablet 0  . prochlorperazine (COMPAZINE) 10 MG tablet Take 1 tablet (10 mg  total) by mouth every 6 (six) hours as needed for nausea or vomiting. 30 tablet 1  . rOPINIRole (REQUIP) 0.25 MG tablet Take 1 tablet (0.25 mg total) by mouth at bedtime. 30 tablet 0  . temazepam (RESTORIL) 15 MG capsule Take 1-2 capsules (15-30 mg total) by mouth at bedtime as needed for sleep. 30 capsule 0  . thyroid (ARMOUR) 60 MG tablet Take 60 mg by mouth daily before breakfast.    . Vitamin D, Ergocalciferol, (DRISDOL) 1.25 MG (50000 UNIT) CAPS capsule Take 50,000 Units by mouth every 7 (seven) days.     No current facility-administered medications for this visit.    VITAL SIGNS: There were no vitals taken for this visit. There were no vitals filed for this visit.  Estimated body mass index is 23.16 kg/m as calculated from the following:   Height as of 01/17/23: 5\' 9"  (1.753 m).   Weight as of 01/21/23: 156 lb 12.8 oz (71.1 kg).   PERFORMANCE STATUS (ECOG) : {CHL ONC ECOG [id number]   Physical Exam General: NAD Cardiovascular: regular rate and rhythm Pulmonary: clear ant fields Abdomen: soft, nontender, + bowel sounds Extremities: no edema, no joint deformities Skin: no rashes Neurological:   IMPRESSION: ***  We discussed Her current illness and what it means in the larger context of Her on-going co-morbidities. Natural disease trajectory and expectations were discussed.  I discussed the importance of continued conversation with family and their medical providers regarding overall plan of care and treatment options, ensuring decisions are within the context of the patients values and GOCs.  PLAN:    Patient expressed understanding and was in agreement with this plan. She also understands that She can call the clinic at any time with any questions, concerns, or complaints.   Any controlled substances utilized were prescribed in the context of palliative care. PDMP has been reviewed.    Visit consisted of counseling and education dealing with the complex and  emotionally intense issues of symptom management and palliative care in the setting of serious and potentially life-threatening illness.  Willette Alma, AGPCNP-BC  Palliative Medicine Team/ Cancer Center  *Please note that this is a verbal dictation therefore any spelling or grammatical errors are due to the "Dragon Medical One" system interpretation.

## 2023-01-31 NOTE — Progress Notes (Signed)
Lower extremity venous duplex completed. Please see CV Procedures for preliminary results.  Shona Simpson, RVT 01/31/23 1:48 PM

## 2023-02-01 ENCOUNTER — Other Ambulatory Visit: Payer: Commercial Managed Care - PPO

## 2023-02-01 ENCOUNTER — Encounter: Payer: Self-pay | Admitting: Nurse Practitioner

## 2023-02-01 ENCOUNTER — Inpatient Hospital Stay (HOSPITAL_BASED_OUTPATIENT_CLINIC_OR_DEPARTMENT_OTHER): Payer: Commercial Managed Care - PPO | Admitting: Physician Assistant

## 2023-02-01 ENCOUNTER — Other Ambulatory Visit: Payer: Self-pay | Admitting: Hematology and Oncology

## 2023-02-01 ENCOUNTER — Ambulatory Visit
Admission: RE | Admit: 2023-02-01 | Discharge: 2023-02-01 | Disposition: A | Discharge status: Home / Self Care | Payer: Commercial Managed Care - PPO | Source: Ambulatory Visit | Attending: Radiation Oncology | Admitting: Radiation Oncology

## 2023-02-01 ENCOUNTER — Inpatient Hospital Stay: Payer: Commercial Managed Care - PPO

## 2023-02-01 ENCOUNTER — Ambulatory Visit: Payer: Commercial Managed Care - PPO | Admitting: Hematology and Oncology

## 2023-02-01 ENCOUNTER — Ambulatory Visit: Payer: Commercial Managed Care - PPO

## 2023-02-01 ENCOUNTER — Other Ambulatory Visit: Payer: Self-pay

## 2023-02-01 ENCOUNTER — Inpatient Hospital Stay (HOSPITAL_BASED_OUTPATIENT_CLINIC_OR_DEPARTMENT_OTHER): Payer: Commercial Managed Care - PPO | Admitting: Nurse Practitioner

## 2023-02-01 ENCOUNTER — Encounter: Payer: Self-pay | Admitting: Internal Medicine

## 2023-02-01 ENCOUNTER — Other Ambulatory Visit: Payer: Self-pay | Admitting: Internal Medicine

## 2023-02-01 VITALS — BP 126/83 | HR 67 | Temp 97.9°F | Resp 17 | Wt 155.3 lb

## 2023-02-01 DIAGNOSIS — M792 Neuralgia and neuritis, unspecified: Secondary | ICD-10-CM | POA: Diagnosis not present

## 2023-02-01 DIAGNOSIS — C50411 Malignant neoplasm of upper-outer quadrant of right female breast: Secondary | ICD-10-CM

## 2023-02-01 DIAGNOSIS — Z95828 Presence of other vascular implants and grafts: Secondary | ICD-10-CM

## 2023-02-01 DIAGNOSIS — Z171 Estrogen receptor negative status [ER-]: Secondary | ICD-10-CM

## 2023-02-01 DIAGNOSIS — M7989 Other specified soft tissue disorders: Secondary | ICD-10-CM

## 2023-02-01 DIAGNOSIS — Z515 Encounter for palliative care: Secondary | ICD-10-CM

## 2023-02-01 DIAGNOSIS — C7931 Secondary malignant neoplasm of brain: Secondary | ICD-10-CM

## 2023-02-01 DIAGNOSIS — D696 Thrombocytopenia, unspecified: Secondary | ICD-10-CM | POA: Diagnosis not present

## 2023-02-01 DIAGNOSIS — Z7189 Other specified counseling: Secondary | ICD-10-CM

## 2023-02-01 DIAGNOSIS — G893 Neoplasm related pain (acute) (chronic): Secondary | ICD-10-CM | POA: Diagnosis not present

## 2023-02-01 DIAGNOSIS — G2581 Restless legs syndrome: Secondary | ICD-10-CM | POA: Diagnosis not present

## 2023-02-01 DIAGNOSIS — R197 Diarrhea, unspecified: Secondary | ICD-10-CM

## 2023-02-01 LAB — CMP (CANCER CENTER ONLY)
ALT: 24 U/L (ref 0–44)
AST: 14 U/L — ABNORMAL LOW (ref 15–41)
Albumin: 3.4 g/dL — ABNORMAL LOW (ref 3.5–5.0)
Alkaline Phosphatase: 37 U/L — ABNORMAL LOW (ref 38–126)
Anion gap: 7 (ref 5–15)
BUN: 20 mg/dL (ref 6–20)
CO2: 26 mmol/L (ref 22–32)
Calcium: 8.3 mg/dL — ABNORMAL LOW (ref 8.9–10.3)
Chloride: 101 mmol/L (ref 98–111)
Creatinine: 0.69 mg/dL (ref 0.44–1.00)
GFR, Estimated: >60 mL/min (ref >60)
Glucose, Bld: 118 mg/dL — ABNORMAL HIGH (ref 70–99)
Potassium: 4.1 mmol/L (ref 3.5–5.1)
Sodium: 134 mmol/L — ABNORMAL LOW (ref 135–145)
Total Bilirubin: 0.4 mg/dL (ref <1.2)
Total Protein: 5.5 g/dL — ABNORMAL LOW (ref 6.5–8.1)

## 2023-02-01 LAB — CBC WITH DIFFERENTIAL (CANCER CENTER ONLY)
Abs Immature Granulocytes: 0.28 10*3/uL — ABNORMAL HIGH (ref 0.00–0.07)
Basophils Absolute: 0 10*3/uL (ref 0.0–0.1)
Basophils Relative: 0 %
Eosinophils Absolute: 0 10*3/uL (ref 0.0–0.5)
Eosinophils Relative: 0 %
HCT: 34.3 % — ABNORMAL LOW (ref 36.0–46.0)
Hemoglobin: 12.4 g/dL (ref 12.0–15.0)
Immature Granulocytes: 7 %
Lymphocytes Relative: 5 %
Lymphs Abs: 0.2 10*3/uL — ABNORMAL LOW (ref 0.7–4.0)
MCH: 33.7 pg (ref 26.0–34.0)
MCHC: 36.2 g/dL — ABNORMAL HIGH (ref 30.0–36.0)
MCV: 93.2 fL (ref 80.0–100.0)
Monocytes Absolute: 0.3 10*3/uL (ref 0.1–1.0)
Monocytes Relative: 9 %
Neutro Abs: 3.1 10*3/uL (ref 1.7–7.7)
Neutrophils Relative %: 79 %
Platelet Count: 127 10*3/uL — ABNORMAL LOW (ref 150–400)
RBC: 3.68 MIL/uL — ABNORMAL LOW (ref 3.87–5.11)
RDW: 12.6 % (ref 11.5–15.5)
Smear Review: NORMAL
WBC Count: 3.9 10*3/uL — ABNORMAL LOW (ref 4.0–10.5)
nRBC: 1 % — ABNORMAL HIGH (ref 0.0–0.2)

## 2023-02-01 LAB — RAD ONC ARIA SESSION SUMMARY
Course Elapsed Days: 20
Plan Fractions Treated to Date: 4
Plan Prescribed Dose Per Fraction: 2.5 Gy
Plan Total Fractions Prescribed: 8
Plan Total Prescribed Dose: 20 Gy
Reference Point Dosage Given to Date: 10 Gy
Reference Point Session Dosage Given: 2.5 Gy
Session Number: 15

## 2023-02-01 LAB — MAGNESIUM: Magnesium: 1.8 mg/dL (ref 1.7–2.4)

## 2023-02-01 MED ORDER — SODIUM CHLORIDE 0.9% FLUSH
10.0000 mL | Freq: Once | INTRAVENOUS | Status: AC
  Administered 2023-02-01: 10 mL

## 2023-02-01 MED ORDER — DEXAMETHASONE 1 MG PO TABS: ORAL_TABLET | ORAL | 0 refills | Status: AC

## 2023-02-01 MED ORDER — ROPINIROLE HCL 0.25 MG PO TABS: 0.2500 mg | ORAL_TABLET | Freq: Every day | ORAL | 3 refills | Status: DC

## 2023-02-01 MED ORDER — HEPARIN SOD (PORK) LOCK FLUSH 100 UNIT/ML IV SOLN
500.0000 [IU] | Freq: Once | INTRAVENOUS | Status: AC
  Administered 2023-02-01: 500 [IU]

## 2023-02-01 MED ORDER — SODIUM CHLORIDE 0.9 % IV SOLN: INTRAVENOUS | Status: DC

## 2023-02-01 NOTE — Patient Instructions (Addendum)
Dr. Barbaraann Cao agrees with tapering your Decadron. He sent a prescription for the 1 mg decadron tablets to your pharmacy so please go ahead and pick those up before you leave so you'll have them incase there is a delay getting care in [place].  He recommends:  -Starting today 4mg  daily  x 1 week  -then 3mg  daily x1 week  -then 2mg   daily x 1 week  -then 1mg  daily x1 week.

## 2023-02-01 NOTE — Progress Notes (Signed)
Symptom Management Consult Note [facility]    Patient Care Team: Samuella Bruin as PCP - General (Physician Assistant) Serena Croissant, MD as Consulting Physician (Hematology and Oncology) Abigail Miyamoto, MD as Consulting Physician (General Surgery) Lonie Peak, MD as Attending Physician (Radiation Oncology) Pickenpack-Cousar, Arty Baumgartner, NP as Nurse Practitioner [facility] and Palliative Medicine)    Name / MRN / DOB: Tara White  [id number]  December 20, 1990   Date of visit: 02/01/2023   Chief Complaint/Reason for visit: diarrhea   Current Therapy: Enhertu with concurrent whole brain radiation in addition to C2 and L2-S5.   Last treatment:  Day 1   Cycle 1 on 01/14/23   ASSESSMENT & PLAN: Patient is a 32 y.o. 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 Pamelia Hoit.  I have viewed most recent oncology note and lab work.    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 Barbaraann Cao as patient is interested in tapering dose. He recommends 4mg   1 week, then 3 mg x1 week, 2 mg x1 week, 1mg  x1 week. He sent prescription for 1mg  dcadron to her pharmacy. -Patient planning to transfer care to [facility] starting next week. She will discuss this with oncologist at upcoming appointment on 02/04/23.   #Diarrhea -Improved from loose stools to solid but frequent bowel movements. Patient is currently on Imodium and Lomotil. Without diarrhea currently she can stop imodium and lomotil. Discussed how to take when symptomatic.  -CMP without significant electrolyte derangement.  -Patient received 1L NS in clinic for hydration support.  #Lower extremity swelling -Unilateral swelling noted in the left ankle. Ultrasound negative for deep vein thrombosis. -Elevate legs as much as possible. Consider use of compression socks during the day.  #Thrombocytopenia -Secondary to  treatment -Platelets today 127k. Patient without abnormal bleeding. Discussed bleeding precautions. -Mild leukopenia WBC 3.9 with ANC 3.1. Discussed neutropenic precautions.   Strict ED precautions discussed should symptoms worsen.   Heme/Onc History: Oncology History  Malignant neoplasm of upper-outer quadrant of right breast in female, estrogen receptor negative (HCC)  04/08/2021 Initial Diagnosis   History of right breast eczematous change to the nipple, patient had palpable lump in the right breast her mammogram 2.1 cm spiculated mass with extensive calcifications that span 11 cm.  Ultrasound revealed 3 cm mass at 10:00 plus small satellite lesions.  Biopsy revealed grade 3 IDC ER 0%, PR 0%, HER2 3+ positive, Ki-67 30%   04/08/2021 Cancer Staging   Staging form: Breast, AJCC 8th Edition - Clinical stage from 04/08/2021: Stage IIB (cT2, cN1, cM0, G3, ER-, PR-, HER2+) - Signed by Serena Croissant, MD on 04/15/2021 Stage prefix: Initial diagnosis Method of lymph node assessment: Axillary lymph node dissection Histologic grading system: 3 grade system   05/01/2021 - 11/26/2021 Chemotherapy   Patient is on Treatment Plan : BREAST  Docetaxel + Carboplatin + Trastuzumab + Pertuzumab  (TCHP) q21d      05/21/2021 Genetic Testing   Negative hereditary cancer genetic testing: no pathogenic variants detected in [facility] CustomNext-Cancer +RNAinsight Panel.  Report date is 05/21/2021.   The CustomNext-Cancer+RNAinsight panel offered by [employer] includes sequencing and rearrangement analysis for the following 47 genes:  APC, ATM, AXIN2, BARD1, BMPR1A, BRCA1, BRCA2, BRIP1, CDH1, CDK4, CDKN2A, CHEK2, DICER1, EPCAM, GREM1, HOXB13, MEN1, MLH1, MSH2, MSH3, MSH6, MUTYH, NBN, NF1, NF2, NTHL1, PALB2, PMS2, POLD1, POLE, PTEN, RAD51C, RAD51D, RECQL, RET, SDHA, SDHAF2, SDHB, SDHC,  SDHD, SMAD4, SMARCA4, STK11, TP53, TSC1, TSC2, and VHL.  RNA data is routinely analyzed for use in variant interpretation for all genes.    09/04/2021 - 04/21/2022 Chemotherapy   Patient is on Treatment Plan : BREAST Trastuzumab  + Pertuzumab q21d x 13 cycles     10/13/2021 Surgery   right mastectomy with reconstruction: No residual malignancy complete pathologic response 0/5 lymph nodes negative    12/03/2021 - 01/11/2022 Radiation Therapy   Site Technique Total Dose (Gy) Dose per Fx (Gy) Completed Fx Beam Energies  Chest Wall, Right: CW_R_IMN 3D 50.4/50.4 1.8 28/28 6XFFF  Chest Wall, Right: CW_R_PAB_SCV 3D 50.4/50.4 1.8 28/28 6X, 10X     12/31/2022 - 12/31/2022 Chemotherapy   Patient is on Treatment Plan : BREAST Trastuzumab + Pertuzumab q21d     01/11/2023 - 01/11/2023 Chemotherapy   Patient is on Treatment Plan : BREAST METASTATIC Fam-Trastuzumab Deruxtecan-nxki (Enhertu) (5.4) q21d     01/14/2023 -  Chemotherapy   Patient is on Treatment Plan : BREAST METASTATIC Fam-Trastuzumab Deruxtecan-nxki (Enhertu) (5.4) q21d         Interval history-:  Discussed the use of AI scribe software for clinical note transcription with the patient, who gave verbal consent to proceed.   Tara White is a 32 y.o. female with oncologic history as above presenting to [facility] today with chief complaint of diarrhea. Patient presents unaccompanied to clinic today.  The patient had been managing the diarrhea with a regimen of Imodium and Lomotil, taken consistently every three to six hours. She expressed anxiety about reducing the dosage of these medications due to the fear of the diarrhea returning. She also reported taking Zofran for nausea, which was described as a constant but not severe sensation.  In addition to the gastrointestinal symptoms, the patient reported a recent onset of swelling in one ankle, which started the day before the consultation. She denied any associated pain or discomfort. She denies any shortness of breath or chest pain.  Patient reports she is interested in tapering off her decadron and would like to know how to  safely do so. She forgot to ask radiation oncologist at her recent appointment.  In terms of her cancer treatment, the patient revealed plans to temporarily relocate to [place] for three months to receive genetically targeted chemotherapy [facility] an [facility]. She was considering skipping her chemotherapy treatment for the current week to avoid feeling unwell during the transition. She had an appointment scheduled with her oncologist later in the week to discuss this plan in more detail.  Despite the challenges, the patient reported making efforts to maintain her nutrition and hydration. She described a typical day's meals and mentioned drinking fluids such as Spin Drift and a homemade alkaline lemon juice with baking soda, which she believed helped her symptoms. She acknowledged that she was not drinking enough fluids and expressed a lack of appetite.    ROS  All other systems are reviewed and are negative for acute change except as noted in the HPI.    No Known Allergies   Past Medical History:  Diagnosis Date   DVT (deep venous thrombosis) (HCC) 06/25/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   Family history of breast cancer 04/21/2021   right breast ca 03/2021     Past Surgical History:  Procedure Laterality Date   BREAST ENHANCEMENT SURGERY Left 06/25/2022   Procedure: MAMMOPLASTY AUGMENTATION, MASTOPEXY (BREAST);  Surgeon: Glenna Fellows, MD;  Location: [facility] OR;  Service: Plastics;  Laterality: Left;   BREAST  RECONSTRUCTION WITH PLACEMENT OF TISSUE EXPANDER AND ALLODERM Right 10/13/2021   Procedure: RIGHT BREAST RECONSTRUCTION WITH PLACEMENT OF TISSUE EXPANDER AND ALLODERM;  Surgeon: Glenna Fellows, MD;  Location: [facility];  Service: Plastics;  Laterality: Right;   CESAREAN SECTION N/A 12/10/2015   Procedure: CESAREAN SECTION;  Surgeon: Noland Fordyce, MD;  Location: [facility];  Service: Obstetrics;  Laterality: N/A;   IR CV LINE INJECTION  06/25/2021   IR  IMAGING GUIDED PORT INSERTION  12/20/2022   IR RADIOLOGY PERIPHERAL GUIDED IV START  06/25/2021   IR US GUIDE VASC ACCESS LEFT  06/25/2021   IR VENO/EXT/UNI LEFT  06/25/2021   NODE DISSECTION Right 10/13/2021   Procedure: TARGETED LYMPH NODE DISSECTION;  Surgeon: Abigail Miyamoto, MD;  Location: [facility];  Service: General;  Laterality: Right;   PORT-A-CATH REMOVAL Left 05/27/2022   Procedure: REMOVAL PORT-A-CATH;  Surgeon: Abigail Miyamoto, MD;  Location: [facility];  Service: General;  Laterality: Left;   PORTACATH PLACEMENT Left 04/30/2021   Procedure: INSERTION PORT-A-CATH;  Surgeon: Abigail Miyamoto, MD;  Location: [facility];  Service: General;  Laterality: Left;   REMOVAL OF TISSUE EXPANDER AND PLACEMENT OF IMPLANT Right 06/25/2022   Procedure: REMOVAL OF TISSUE EXPANDER AND PLACEMENT OF IMPLANT;  Surgeon: Glenna Fellows, MD;  Location: [facility];  Service: Plastics;  Laterality: Right;   SIMPLE MASTECTOMY WITH AXILLARY SENTINEL NODE BIOPSY Right 10/13/2021   Procedure: RIGHT MASTECTOMY;  Surgeon: Abigail Miyamoto, MD;  Location: [facility];  Service: General;  Laterality: Right;   WISDOM TOOTH EXTRACTION      Social History   Socioeconomic History   Marital status: Married    Spouse name: Not on file   Number of children: Not on file   Years of education: Not on file   Highest education level: Not on file  Occupational History   Not on file  Tobacco Use   Smoking status: Never   Smokeless tobacco: Never  Vaping Use   Vaping status: Not on file  Substance and Sexual Activity   Alcohol use: Yes    Alcohol/week: 2.0 standard drinks of alcohol    Types: 2 Standard drinks or equivalent per week    Comment: rarely   Drug use: No   Sexual activity: Not Currently    Partners: Male    Birth control/protection: Other-see comments    Comment: cycles have not resumed since receiving chemotherapy  Other Topics Concern   Not  on file  Social History Narrative   Not on file   Social Determinants of Health   Financial Resource Strain: Low Risk  (11/01/2022)   Received from [facility]   Overall Financial Resource Strain (CARDIA)    Difficulty of Paying Living Expenses: Not hard at all  Food Insecurity: No Food Insecurity (11/01/2022)   Received from [facility]   Hunger Vital Sign    Worried About Running Out of Food in the Last Year: Never true    Ran Out of Food in the Last Year: Never true  Transportation Needs: No Transportation Needs (11/01/2022)   Received from [facility] - Transportation    Lack of Transportation (Medical): No    Lack of Transportation (Non-Medical): No  Physical Activity: Not on file  Stress: Not on file  Social Connections: Unknown (07/27/2021)   Received from [facility]   Social Network    Social Network: Not on file  Intimate Partner  Violence: Not At Risk (09/15/2022)   Humiliation, Afraid, Rape, and Kick questionnaire    Fear of Current or Ex-Partner: No    Emotionally Abused: No    Physically Abused: No    Sexually Abused: No    Family History  Problem Relation Age of Onset   Hypothyroidism Mother    Breast cancer Maternal Grandmother 52   Skin cancer Maternal Grandfather    Heart failure Paternal Grandfather      Current Outpatient Medications:    ascorbic acid (VITAMIN C) 1000 MG tablet, Take 1,000 mg by mouth daily., Disp: , Rfl:    carboxymethylcellulose (REFRESH TEARS) 0.5 % SOLN, Place 1 drop into both eyes 3 (three) times daily as needed (Dry eye)., Disp: , Rfl:    Cholecalciferol (VITAMIN D) 125 MCG (5000 UT) CAPS, Take 5,000 Int'l Units/L by mouth daily., Disp: , Rfl:    Cholecalciferol (VITAMIN D-3) 5000 UNIT/ML LIQD, Take by mouth., Disp: , Rfl:    cimetidine (TAGAMET) 400 MG tablet, Take 400 mg by mouth 2 (two) times daily., Disp: , Rfl:    [START ON 02/08/2023] dexamethasone (DECADRON) 1 MG tablet, Take 3 tablets (3 mg  total) by mouth daily with breakfast for 7 days, THEN 2 tablets (2 mg total) daily with breakfast for 7 days, THEN 1 tablet (1 mg total) daily with breakfast for 7 days., Disp: 42 tablet, Rfl: 0   diphenhydrAMINE (BENADRYL) 25 mg capsule, Take 25 mg by mouth at bedtime as needed., Disp: , Rfl:    diphenoxylate-atropine (LOMOTIL) 2.5-0.025 MG tablet, Take 1 tablet by mouth 4 (four) times daily as needed for diarrhea or loose stools., Disp: 30 tablet, Rfl: 3   dipyridamole (PERSANTINE) 75 MG tablet, Take 75 mg by mouth 2 (two) times daily., Disp: , Rfl:    DULoxetine (CYMBALTA) 30 MG capsule, Take 1 capsule (30 mg total) by mouth daily., Disp: 15 capsule, Rfl: 0   etodolac (LODINE) 300 MG capsule, Take 300 mg by mouth 2 (two) times daily., Disp: , Rfl:    gabapentin (NEURONTIN) 300 MG capsule, Take 2 capsules (600 mg) twice daily and 3 capsules (900 mg) at bedtime., Disp: 210 capsule, Rfl: 0   Hydroxychloroquine Sulfate 100 MG TABS, Take 1 tablet by mouth 2 (two) times daily., Disp: , Rfl:    lidocaine-prilocaine (EMLA) cream, Apply to affected area once, Disp: 30 g, Rfl: 3   lovastatin (MEVACOR) 20 MG tablet, Take 20 mg by mouth at bedtime., Disp: , Rfl:    magnesium oxide (MAG-OX) 400 MG tablet, Take 400 mg by mouth daily., Disp: , Rfl:    Melatonin 10 MG CAPS, Take 10 mg by mouth at bedtime., Disp: , Rfl:    metFORMIN (GLUCOPHAGE) 500 MG tablet, Take 500 mg by mouth 2 (two) times daily., Disp: , Rfl:    minocycline (MINOCIN) 100 MG capsule, Take 100 mg by mouth 2 (two) times daily., Disp: , Rfl:    NALTREXONE HCL PO, Take 3 mg by mouth daily., Disp: , Rfl:    nitrofurantoin, macrocrystal-monohydrate, (MACROBID) 100 MG capsule, Take 100 mg by mouth every 12 (twelve) hours., Disp: , Rfl:    NON FORMULARY, Take 3 mg by mouth at bedtime. LOW DOSE NALTROXENE Takes when not taking oxycodone, Disp: , Rfl:    ondansetron (ZOFRAN) 8 MG tablet, Take 1 tablet (8 mg total) by mouth every 8 (eight) hours as  needed for nausea or vomiting. Start on the third day after chemotherapy., Disp: 30 tablet, Rfl: 1  oxyCODONE-acetaminophen (PERCOCET/ROXICET) 5-325 MG tablet, Take 1 tablet by mouth every 8 (eight) hours as needed for severe pain., Disp: 60 tablet, Rfl: 0   prochlorperazine (COMPAZINE) 10 MG tablet, Take 1 tablet (10 mg total) by mouth every 6 (six) hours as needed for nausea or vomiting., Disp: 30 tablet, Rfl: 1   rOPINIRole (REQUIP) 0.25 MG tablet, Take 1 tablet (0.25 mg total) by mouth at bedtime., Disp: 30 tablet, Rfl: 3   temazepam (RESTORIL) 15 MG capsule, Take 1-2 capsules (15-30 mg total) by mouth at bedtime as needed for sleep., Disp: 30 capsule, Rfl: 0   thyroid (ARMOUR) 60 MG tablet, Take 60 mg by mouth daily before breakfast., Disp: , Rfl:    Vitamin D, Ergocalciferol, (DRISDOL) 1.25 MG (50000 UNIT) CAPS capsule, Take 50,000 Units by mouth every 7 (seven) days., Disp: , Rfl:  No current facility-administered medications for this visit.  Facility-Administered Medications Ordered in Other Visits:    0.9 %  sodium chloride infusion, , Intravenous, Continuous, Gudena, Vinay, MD, Stopped at 02/01/23 1108  PHYSICAL EXAM: ECOG FS:1 - Symptomatic but completely ambulatory    Vitals:   02/01/23 1000  BP: 126/83  Pulse: 67  Resp: 17  Temp: 97.9 F (36.6 C)  TempSrc: Oral  SpO2: 100%  Weight: 155 lb 4.8 oz (70.4 kg)   Physical Exam Vitals and nursing note reviewed.  Constitutional:      Appearance: She is not ill-appearing or toxic-appearing.  HENT:     Head: Normocephalic.  Eyes:     Conjunctiva/sclera: Conjunctivae normal.  Cardiovascular:     Rate and Rhythm: Normal rate.     Pulses: Normal pulses.          Dorsalis pedis pulses are 2+ on the right side.  Pulmonary:     Effort: Pulmonary effort is normal.  Abdominal:     General: There is no distension.     Palpations: Abdomen is soft.     Tenderness: There is no abdominal tenderness.  Musculoskeletal:     Cervical  back: Normal range of motion.     Right lower leg: Edema (mild RLE. non pitting. No calf tenderness) present.  Skin:    General: Skin is warm and dry.  Neurological:     Mental Status: She is alert.        LABORATORY DATA: I have reviewed the data as listed    Latest Ref Rng & Units 02/01/2023    8:28 AM 01/21/2023   11:22 AM 01/17/2023    9:38 PM  CBC  WBC 4.0 - 10.5 K/uL 3.9  4.9  5.4   Hemoglobin 12.0 - 15.0 g/dL 91.4  78.2  95.6   Hematocrit 36.0 - 46.0 % 34.3  33.6  31.8   Platelets 150 - 400 K/uL 127  130  184         Latest Ref Rng & Units 02/01/2023    8:28 AM 01/21/2023   11:22 AM 01/17/2023    9:38 PM  CMP  Glucose 70 - 99 mg/dL 213  086  578   BUN 6 - 20 mg/dL 20  24  25    Creatinine 0.44 - 1.00 mg/dL 4.69  6.29  5.28   Sodium 135 - 145 mmol/L 134  137  134   Potassium 3.5 - 5.1 mmol/L 4.1  4.0  3.7   Chloride 98 - 111 mmol/L 101  105  101   CO2 22 - 32 mmol/L 26  25  25  Calcium 8.9 - 10.3 mg/dL 8.3  8.9  8.9   Total Protein 6.5 - 8.1 g/dL 5.5  6.2    Total Bilirubin <1.2 mg/dL 0.4  0.3    Alkaline Phos 38 - 126 U/L 37  46    AST 15 - 41 U/L 14  21    ALT 0 - 44 U/L 24  26         RADIOGRAPHIC STUDIES (from last 24 hours if applicable) I have personally reviewed the radiological images as listed and agreed with the findings in the report. VAS [place] LOWER EXTREMITY VENOUS (DVT)  Result Date: 01/31/2023  Lower Venous DVT Study Patient Name:  VENOLA CASTELLO [facility]  Date of Exam:   01/31/2023 Medical Rec #: [id number]             Accession #:    [id number] Date of Birth: 27-Dec-1990             Patient Gender: F Patient Age:   8 years Exam Location:  [facility] Procedure:      VAS [place] LOWER EXTREMITY VENOUS (DVT) Referring Phys: Namon Cirri --------------------------------------------------------------------------------  Indications: Swelling.  Risk Factors: Cancer Breast past pregnancy. Comparison Study: No significant changes seen since prior  exam 07/19/21 Performing Technologist: Shona Simpson  Examination Guidelines: A complete evaluation includes B-mode imaging, spectral Doppler, color Doppler, and power Doppler as needed of all accessible portions of each vessel. Bilateral testing is considered an integral part of a complete examination. Limited examinations for reoccurring indications may be performed as noted. The reflux portion of the exam is performed with the patient in reverse Trendelenburg.  +---------+---------------+---------+-----------+----------+--------------+ RIGHT    CompressibilityPhasicitySpontaneityPropertiesThrombus Aging +---------+---------------+---------+-----------+----------+--------------+ CFV      Full           Yes      Yes                                 +---------+---------------+---------+-----------+----------+--------------+ SFJ      Full                                                        +---------+---------------+---------+-----------+----------+--------------+ FV Prox  Full                                                        +---------+---------------+---------+-----------+----------+--------------+ FV Mid   Full                                                        +---------+---------------+---------+-----------+----------+--------------+ FV DistalFull                                                        +---------+---------------+---------+-----------+----------+--------------+ PFV      Full                                                        +---------+---------------+---------+-----------+----------+--------------+  POP      Full           Yes      Yes                                 +---------+---------------+---------+-----------+----------+--------------+ PTV      Full                                                        +---------+---------------+---------+-----------+----------+--------------+ PERO     Full                                                         +---------+---------------+---------+-----------+----------+--------------+   +----+---------------+---------+-----------+----------+--------------+ LEFTCompressibilityPhasicitySpontaneityPropertiesThrombus Aging +----+---------------+---------+-----------+----------+--------------+ CFV Full           Yes      Yes                                 +----+---------------+---------+-----------+----------+--------------+     Summary: RIGHT: - There is no evidence of deep vein thrombosis in the lower extremity.  - No cystic structure found in the popliteal fossa.  LEFT: - No evidence of common femoral vein obstruction.   *See table(s) above for measurements and observations. Electronically signed by Sherald Hess MD on 01/31/2023 at 3:06:54 PM.    Final         Visit Diagnosis: 1. Malignant neoplasm of upper-outer quadrant of right breast in female, estrogen receptor negative (HCC)   2. Thrombocytopenia (HCC)      No orders of the defined types were placed in this encounter.   All questions were answered. The patient knows to call the clinic with any problems, questions or concerns. No barriers to learning was detected.  A total of more than 30 minutes were spent on this encounter with face-to-face time and non-face-to-face time, including preparing to see the patient, ordering tests and/or medications, counseling the patient and coordination of care as outlined above.    Thank you for allowing me to participate in the care of this patient.    Shanon Ace, PA-C Department of Hematology/Oncology [facility] at [facility] Phone: [phone]  [phone]    02/01/2023 11:16 AM

## 2023-02-02 ENCOUNTER — Ambulatory Visit
Admission: RE | Admit: 2023-02-02 | Discharge: 2023-02-02 | Disposition: A | Discharge status: Home / Self Care | Payer: Commercial Managed Care - PPO | Source: Ambulatory Visit | Attending: Radiation Oncology

## 2023-02-02 ENCOUNTER — Other Ambulatory Visit: Payer: Self-pay

## 2023-02-02 DIAGNOSIS — C50411 Malignant neoplasm of upper-outer quadrant of right female breast: Secondary | ICD-10-CM | POA: Diagnosis not present

## 2023-02-02 LAB — RAD ONC ARIA SESSION SUMMARY
Course Elapsed Days: 21
Plan Fractions Treated to Date: 5
Plan Prescribed Dose Per Fraction: 2.5 Gy
Plan Total Fractions Prescribed: 8
Plan Total Prescribed Dose: 20 Gy
Reference Point Dosage Given to Date: 12.5 Gy
Reference Point Session Dosage Given: 2.5 Gy
Session Number: 16

## 2023-02-03 ENCOUNTER — Other Ambulatory Visit: Payer: Self-pay

## 2023-02-03 ENCOUNTER — Ambulatory Visit
Admission: RE | Admit: 2023-02-03 | Discharge: 2023-02-03 | Disposition: A | Discharge status: Home / Self Care | Payer: Commercial Managed Care - PPO | Source: Ambulatory Visit | Attending: Radiation Oncology | Admitting: Radiation Oncology

## 2023-02-03 DIAGNOSIS — C50411 Malignant neoplasm of upper-outer quadrant of right female breast: Secondary | ICD-10-CM | POA: Diagnosis not present

## 2023-02-03 LAB — RAD ONC ARIA SESSION SUMMARY
Course Elapsed Days: 22
Plan Fractions Treated to Date: 6
Plan Prescribed Dose Per Fraction: 2.5 Gy
Plan Total Fractions Prescribed: 8
Plan Total Prescribed Dose: 20 Gy
Reference Point Dosage Given to Date: 15 Gy
Reference Point Session Dosage Given: 2.5 Gy
Session Number: 17

## 2023-02-03 MED FILL — Fosaprepitant Dimeglumine For IV Infusion 150 MG (Base Eq): INTRAVENOUS | Qty: 5 | Status: AC

## 2023-02-04 ENCOUNTER — Inpatient Hospital Stay: Payer: Commercial Managed Care - PPO

## 2023-02-04 ENCOUNTER — Ambulatory Visit: Payer: Commercial Managed Care - PPO | Admitting: Nurse Practitioner

## 2023-02-04 ENCOUNTER — Inpatient Hospital Stay (HOSPITAL_BASED_OUTPATIENT_CLINIC_OR_DEPARTMENT_OTHER): Payer: Commercial Managed Care - PPO | Admitting: Hematology and Oncology

## 2023-02-04 ENCOUNTER — Ambulatory Visit
Admission: RE | Admit: 2023-02-04 | Discharge: 2023-02-04 | Disposition: A | Discharge status: Home / Self Care | Payer: Commercial Managed Care - PPO | Source: Ambulatory Visit | Attending: Radiation Oncology

## 2023-02-04 ENCOUNTER — Other Ambulatory Visit: Payer: Self-pay

## 2023-02-04 VITALS — BP 127/80 | HR 69 | Temp 97.7°F | Resp 18 | Ht 69.0 in | Wt 147.9 lb

## 2023-02-04 DIAGNOSIS — C50411 Malignant neoplasm of upper-outer quadrant of right female breast: Secondary | ICD-10-CM

## 2023-02-04 DIAGNOSIS — Z171 Estrogen receptor negative status [ER-]: Secondary | ICD-10-CM

## 2023-02-04 DIAGNOSIS — Z95828 Presence of other vascular implants and grafts: Secondary | ICD-10-CM

## 2023-02-04 LAB — CBC WITH DIFFERENTIAL (CANCER CENTER ONLY)
Abs Immature Granulocytes: 0.36 10*3/uL — ABNORMAL HIGH (ref 0.00–0.07)
Basophils Absolute: 0 10*3/uL (ref 0.0–0.1)
Basophils Relative: 0 %
Eosinophils Absolute: 0 10*3/uL (ref 0.0–0.5)
Eosinophils Relative: 0 %
HCT: 32.5 % — ABNORMAL LOW (ref 36.0–46.0)
Hemoglobin: 11.6 g/dL — ABNORMAL LOW (ref 12.0–15.0)
Immature Granulocytes: 6 %
Lymphocytes Relative: 4 %
Lymphs Abs: 0.2 10*3/uL — ABNORMAL LOW (ref 0.7–4.0)
MCH: 33.1 pg (ref 26.0–34.0)
MCHC: 35.7 g/dL (ref 30.0–36.0)
MCV: 92.9 fL (ref 80.0–100.0)
Monocytes Absolute: 0.2 10*3/uL (ref 0.1–1.0)
Monocytes Relative: 3 %
Neutro Abs: 5.5 10*3/uL (ref 1.7–7.7)
Neutrophils Relative %: 87 %
Platelet Count: 137 10*3/uL — ABNORMAL LOW (ref 150–400)
RBC: 3.5 MIL/uL — ABNORMAL LOW (ref 3.87–5.11)
RDW: 13.2 % (ref 11.5–15.5)
Smear Review: NORMAL
WBC Count: 6.2 10*3/uL (ref 4.0–10.5)
nRBC: 0.3 % — ABNORMAL HIGH (ref 0.0–0.2)

## 2023-02-04 LAB — RAD ONC ARIA SESSION SUMMARY
Course Elapsed Days: 23
Plan Fractions Treated to Date: 7
Plan Prescribed Dose Per Fraction: 2.5 Gy
Plan Total Fractions Prescribed: 8
Plan Total Prescribed Dose: 20 Gy
Reference Point Dosage Given to Date: 17.5 Gy
Reference Point Session Dosage Given: 2.5 Gy
Session Number: 18

## 2023-02-04 LAB — CMP (CANCER CENTER ONLY)
ALT: 77 U/L — ABNORMAL HIGH (ref 0–44)
AST: 33 U/L (ref 15–41)
Albumin: 3.4 g/dL — ABNORMAL LOW (ref 3.5–5.0)
Alkaline Phosphatase: 42 U/L (ref 38–126)
Anion gap: 4 — ABNORMAL LOW (ref 5–15)
BUN: 20 mg/dL (ref 6–20)
CO2: 29 mmol/L (ref 22–32)
Calcium: 8.4 mg/dL — ABNORMAL LOW (ref 8.9–10.3)
Chloride: 101 mmol/L (ref 98–111)
Creatinine: 0.63 mg/dL (ref 0.44–1.00)
GFR, Estimated: >60 mL/min (ref >60)
Glucose, Bld: 119 mg/dL — ABNORMAL HIGH (ref 70–99)
Potassium: 4 mmol/L (ref 3.5–5.1)
Sodium: 134 mmol/L — ABNORMAL LOW (ref 135–145)
Total Bilirubin: 0.3 mg/dL (ref <1.2)
Total Protein: 5.5 g/dL — ABNORMAL LOW (ref 6.5–8.1)

## 2023-02-04 LAB — PREGNANCY, URINE: Preg Test, Ur: NEGATIVE

## 2023-02-04 MED ORDER — HEPARIN SOD (PORK) LOCK FLUSH 100 UNIT/ML IV SOLN
500.0000 [IU] | Freq: Once | INTRAVENOUS | Status: AC
  Administered 2023-02-04: 500 [IU]

## 2023-02-04 MED ORDER — SODIUM CHLORIDE 0.9% FLUSH
10.0000 mL | Freq: Once | INTRAVENOUS | Status: AC
  Administered 2023-02-04: 10 mL

## 2023-02-04 NOTE — Assessment & Plan Note (Signed)
Hospitalization: 09/15/2022-09/16/2022: Diplopia: Newly diagnosed brain metastases: 3.74 cm left caudate lobe lesion, not a surgical candidate.   (04/08/2021: ER 0%, PR 0%, HER2 3+ positive right breast cancer status post neoadjuvant chemotherapy with TCH Perjeta: Pathologic complete response, adjuvant radiation, Herceptin and Perjeta maintenance completed 04/21/2021)   Treatment summary: 10/11/2022-10/15/2022: SRS to the brain 01/24/2023: Palliative radiation to C2 Enhertu cycle 1 given on 01/14/2023 (discontinued by patient choice)  Patient is going to [place] to pursue alternative therapies/holistic treatments

## 2023-02-04 NOTE — Progress Notes (Signed)
Patient Care Team: Samuella Bruin as PCP - General (Physician Assistant) Serena Croissant, MD as Consulting Physician (Hematology and Oncology) Abigail Miyamoto, MD as Consulting Physician (General Surgery) Lonie Peak, MD as Attending Physician (Radiation Oncology) Pickenpack-Cousar, Arty Baumgartner, NP as Nurse Practitioner [facility] and Palliative Medicine)  DIAGNOSIS:  Encounter Diagnosis  Name Primary?   Malignant neoplasm of upper-outer quadrant of right breast in female, estrogen receptor negative (HCC) Yes    SUMMARY OF ONCOLOGIC HISTORY: Oncology History  Malignant neoplasm of upper-outer quadrant of right breast in female, estrogen receptor negative (HCC)  04/08/2021 Initial Diagnosis   History of right breast eczematous change to the nipple, patient had palpable lump in the right breast her mammogram 2.1 cm spiculated mass with extensive calcifications that span 11 cm.  Ultrasound revealed 3 cm mass at 10:00 plus small satellite lesions.  Biopsy revealed grade 3 IDC ER 0%, PR 0%, HER2 3+ positive, Ki-67 30%   04/08/2021 Cancer Staging   Staging form: Breast, AJCC 8th Edition - Clinical stage from 04/08/2021: Stage IIB (cT2, cN1, cM0, G3, ER-, PR-, HER2+) - Signed by Serena Croissant, MD on 04/15/2021 Stage prefix: Initial diagnosis Method of lymph node assessment: Axillary lymph node dissection Histologic grading system: 3 grade system   05/01/2021 - 11/26/2021 Chemotherapy   Patient is on Treatment Plan : BREAST  Docetaxel + Carboplatin + Trastuzumab + Pertuzumab  (TCHP) q21d      05/21/2021 Genetic Testing   Negative hereditary cancer genetic testing: no pathogenic variants detected in [facility] CustomNext-Cancer +RNAinsight Panel.  Report date is 05/21/2021.   The CustomNext-Cancer+RNAinsight panel offered by [employer] [facility] includes sequencing and rearrangement analysis for the following 47 genes:  APC, ATM, AXIN2, BARD1, BMPR1A, BRCA1, BRCA2, BRIP1, CDH1, CDK4, CDKN2A, CHEK2,  DICER1, EPCAM, GREM1, HOXB13, MEN1, MLH1, MSH2, MSH3, MSH6, MUTYH, NBN, NF1, NF2, NTHL1, PALB2, PMS2, POLD1, POLE, PTEN, RAD51C, RAD51D, RECQL, RET, SDHA, SDHAF2, SDHB, SDHC, SDHD, SMAD4, SMARCA4, STK11, TP53, TSC1, TSC2, and VHL.  RNA data is routinely analyzed for use in variant interpretation for all genes.   09/04/2021 - 04/21/2022 Chemotherapy   Patient is on Treatment Plan : BREAST Trastuzumab  + Pertuzumab q21d x 13 cycles     10/13/2021 Surgery   right mastectomy with reconstruction: No residual malignancy complete pathologic response 0/5 lymph nodes negative    12/03/2021 - 01/11/2022 Radiation Therapy   Site Technique Total Dose (Gy) Dose per Fx (Gy) Completed Fx Beam Energies  Chest Wall, Right: CW_R_IMN 3D 50.4/50.4 1.8 28/28 6XFFF  Chest Wall, Right: CW_R_PAB_SCV 3D 50.4/50.4 1.8 28/28 6X, 10X     12/31/2022 - 12/31/2022 Chemotherapy   Patient is on Treatment Plan : BREAST Trastuzumab + Pertuzumab q21d     01/11/2023 - 01/11/2023 Chemotherapy   Patient is on Treatment Plan : BREAST METASTATIC Fam-Trastuzumab Deruxtecan-nxki (Enhertu) (5.4) q21d     01/14/2023 -  Chemotherapy   Patient is on Treatment Plan : BREAST METASTATIC Fam-Trastuzumab Deruxtecan-nxki (Enhertu) (5.4) q21d       CHIEF COMPLIANT: Follow-up to discuss and treatment plan moving to [place] for her treatment  HISTORY OF PRESENT ILLNESS:  History of Present Illness   The patient, with a history of met breast cancer, presents with concerns about her current treatment plan and side effects from radiation therapy. She reports experiencing profound diarrhea, which she believes is a side effect of the radiation. She has consulted with other doctors who seem to agree that the diarrhea is likely due to colon toxicity from  the radiation. The patient reports that the diarrhea is improving, but she still experiences unnatural urges and discomfort. She has been managing the diarrhea with Imodium and Lomotil, with Imodium  seeming to be more effective.  The patient is planning to move to [place] for an alternative cancer treatment at a center that offers genetically targeted fractional chemotherapy. She hopes this treatment will not only target the cancer but also address the underlying problem that allowed the cancer to grow. The patient is aware of the risks and uncertainties associated with this treatment plan but is hopeful for a positive outcome.         ALLERGIES:  has No Known Allergies.  MEDICATIONS:  Current Outpatient Medications  Medication Sig Dispense Refill   ascorbic acid (VITAMIN C) 1000 MG tablet Take 1,000 mg by mouth daily.     carboxymethylcellulose (REFRESH TEARS) 0.5 % SOLN Place 1 drop into both eyes 3 (three) times daily as needed (Dry eye).     Cholecalciferol (VITAMIN D) 125 MCG (5000 UT) CAPS Take 5,000 Int'l Units/L by mouth daily.     Cholecalciferol (VITAMIN D-3) 5000 UNIT/ML LIQD Take by mouth.     cimetidine (TAGAMET) 400 MG tablet Take 400 mg by mouth 2 (two) times daily.     [START ON 02/08/2023] dexamethasone (DECADRON) 1 MG tablet Take 3 tablets (3 mg total) by mouth daily with breakfast for 7 days, THEN 2 tablets (2 mg total) daily with breakfast for 7 days, THEN 1 tablet (1 mg total) daily with breakfast for 7 days. 42 tablet 0   diphenhydrAMINE (BENADRYL) 25 mg capsule Take 25 mg by mouth at bedtime as needed.     diphenoxylate-atropine (LOMOTIL) 2.5-0.025 MG tablet Take 1 tablet by mouth 4 (four) times daily as needed for diarrhea or loose stools. 30 tablet 3   dipyridamole (PERSANTINE) 75 MG tablet Take 75 mg by mouth 2 (two) times daily.     DULoxetine (CYMBALTA) 30 MG capsule Take 1 capsule (30 mg total) by mouth daily. 15 capsule 0   etodolac (LODINE) 300 MG capsule Take 300 mg by mouth 2 (two) times daily.     gabapentin (NEURONTIN) 300 MG capsule Take 2 capsules (600 mg) twice daily and 3 capsules (900 mg) at bedtime. 210 capsule 0   Hydroxychloroquine Sulfate 100  MG TABS Take 1 tablet by mouth 2 (two) times daily.     lidocaine-prilocaine (EMLA) cream Apply to affected area once 30 g 3   lovastatin (MEVACOR) 20 MG tablet Take 20 mg by mouth at bedtime.     magnesium oxide (MAG-OX) 400 MG tablet Take 400 mg by mouth daily.     Melatonin 10 MG CAPS Take 10 mg by mouth at bedtime.     metFORMIN (GLUCOPHAGE) 500 MG tablet Take 500 mg by mouth 2 (two) times daily.     minocycline (MINOCIN) 100 MG capsule Take 100 mg by mouth 2 (two) times daily.     NALTREXONE HCL PO Take 3 mg by mouth daily.     nitrofurantoin, macrocrystal-monohydrate, (MACROBID) 100 MG capsule Take 100 mg by mouth every 12 (twelve) hours.     NON FORMULARY Take 3 mg by mouth at bedtime. LOW DOSE NALTROXENE Takes when not taking oxycodone     ondansetron (ZOFRAN) 8 MG tablet Take 1 tablet (8 mg total) by mouth every 8 (eight) hours as needed for nausea or vomiting. Start on the third day after chemotherapy. 30 tablet 1   oxyCODONE-acetaminophen (PERCOCET/ROXICET) 5-325  MG tablet Take 1 tablet by mouth every 8 (eight) hours as needed for severe pain. 60 tablet 0   prochlorperazine (COMPAZINE) 10 MG tablet Take 1 tablet (10 mg total) by mouth every 6 (six) hours as needed for nausea or vomiting. 30 tablet 1   rOPINIRole (REQUIP) 0.25 MG tablet Take 1 tablet (0.25 mg total) by mouth at bedtime. 30 tablet 3   temazepam (RESTORIL) 15 MG capsule Take 1-2 capsules (15-30 mg total) by mouth at bedtime as needed for sleep. 30 capsule 0   thyroid (ARMOUR) 60 MG tablet Take 60 mg by mouth daily before breakfast.     Vitamin D, Ergocalciferol, (DRISDOL) 1.25 MG (50000 UNIT) CAPS capsule Take 50,000 Units by mouth every 7 (seven) days.     No current facility-administered medications for this visit.    PHYSICAL EXAMINATION: ECOG PERFORMANCE STATUS: 1 - Symptomatic but completely ambulatory  Vitals:   02/04/23 1116  BP: 127/80  Pulse: 69  Resp: 18  Temp: 97.7 F (36.5 C)  SpO2: 100%   Filed  Weights   02/04/23 1116  Weight: 147 lb 14.4 oz (67.1 kg)    LABORATORY DATA:  I have reviewed the data as listed    Latest Ref Rng & Units 02/04/2023   10:49 AM 02/01/2023    8:28 AM 01/21/2023   11:22 AM  CMP  Glucose 70 - 99 mg/dL 161  096  045   BUN 6 - 20 mg/dL 20  20  24    Creatinine 0.44 - 1.00 mg/dL 4.09  8.11  9.14   Sodium 135 - 145 mmol/L 134  134  137   Potassium 3.5 - 5.1 mmol/L 4.0  4.1  4.0   Chloride 98 - 111 mmol/L 101  101  105   CO2 22 - 32 mmol/L 29  26  25    Calcium 8.9 - 10.3 mg/dL 8.4  8.3  8.9   Total Protein 6.5 - 8.1 g/dL 5.5  5.5  6.2   Total Bilirubin <1.2 mg/dL 0.3  0.4  0.3   Alkaline Phos 38 - 126 U/L 42  37  46   AST 15 - 41 U/L 33  14  21   ALT 0 - 44 U/L 77  24  26     Lab Results  Component Value Date   WBC 6.2 02/04/2023   HGB 11.6 (L) 02/04/2023   HCT 32.5 (L) 02/04/2023   MCV 92.9 02/04/2023   PLT 137 (L) 02/04/2023   NEUTROABS 5.5 02/04/2023    ASSESSMENT & PLAN:  Malignant neoplasm of upper-outer quadrant of right breast in female, estrogen receptor negative (HCC) Hospitalization: 09/15/2022-09/16/2022: Diplopia: Newly diagnosed brain metastases: 3.74 cm left caudate lobe lesion, not a surgical candidate.   (04/08/2021: ER 0%, PR 0%, HER2 3+ positive right breast cancer status post neoadjuvant chemotherapy with TCH Perjeta: Pathologic complete response, adjuvant radiation, Herceptin and Perjeta maintenance completed 04/21/2021)   Treatment summary: 10/11/2022-10/15/2022: SRS to the brain 01/24/2023: Palliative radiation to C2 Enhertu cycle 1 given on 01/14/2023 (discontinued by patient choice)  Patient is going to [place] to pursue alternative therapies/holistic treatments ------------------------------------- Assessment and Plan    Metastatic breast cancer Patient is seeking alternative treatment at a center in [place] that offers genetically targeted fractional chemotherapy and integrative treatments. The center's approach is to  match the chemotherapy to the cancer and the patient's blood. The patient is scheduled to have an appointment with the center on the following Tuesday. The patient has  experienced side effects from previous treatments, including profound diarrhea, which is thought to be due to radiation. -Encouraged patient to share the treatment protocol once it is known for further discussion and guidance. -Advised patient to keep in touch and share any significant updates or milestones.  Radiation-induced Diarrhea Patient experienced profound diarrhea after radiation treatment, which has been improving. The patient has been managing the symptoms with Imodium and Lomotil. -Continue current management strategy as needed.  Patient will come back in 3 to 4 months after her treatment and we may have to watch and monitor her disease.   No orders of the defined types were placed in this encounter.  The patient has a good understanding of the overall plan. she agrees with it. she will call with any problems that may develop before the next visit here. Total time spent: 30 mins including face to face time and time spent for planning, charting and co-ordination of care   Tamsen Meek, MD 02/04/23

## 2023-02-07 ENCOUNTER — Ambulatory Visit
Admission: RE | Admit: 2023-02-07 | Discharge: 2023-02-07 | Disposition: A | Discharge status: Home / Self Care | Payer: Commercial Managed Care - PPO | Source: Ambulatory Visit | Attending: Radiation Oncology

## 2023-02-07 ENCOUNTER — Other Ambulatory Visit: Payer: Self-pay

## 2023-02-07 DIAGNOSIS — C7931 Secondary malignant neoplasm of brain: Secondary | ICD-10-CM

## 2023-02-07 DIAGNOSIS — C50411 Malignant neoplasm of upper-outer quadrant of right female breast: Secondary | ICD-10-CM | POA: Diagnosis not present

## 2023-02-07 LAB — RAD ONC ARIA SESSION SUMMARY
Course Elapsed Days: 26
Plan Fractions Treated to Date: 8
Plan Prescribed Dose Per Fraction: 2.5 Gy
Plan Total Fractions Prescribed: 8
Plan Total Prescribed Dose: 20 Gy
Reference Point Dosage Given to Date: 20 Gy
Reference Point Session Dosage Given: 2.5 Gy
Session Number: 19

## 2023-02-07 MED ORDER — SONAFINE EX EMUL
1.0000 | Freq: Two times a day (BID) | CUTANEOUS | Status: DC
  Administered 2023-02-07: 1 via TOPICAL

## 2023-02-08 NOTE — Radiation Completion Notes (Signed)
Patient Name: Tara White, Tara White MRN: [id number] Date of Birth: [birth year] Referring Physician: Teena Dunk, M.D. Date of Service: 2023-02-08 Radiation Oncologist: Lonie Peak, M.D.  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; C79.31 Secondary malignant neoplasm of brain Staging on 2021-04-08: Malignant neoplasm of upper-outer quadrant of right breast in female, estrogen receptor negative (HCC) T=cT2, N=cN1, M=cM0 Intent: Palliative     ==========DELIVERED PLANS==========  First Treatment Date: 2023-01-12 - Last Treatment Date: 2023-02-07   Plan Name: Spine_C2 Site: Cervical Spine Technique: 3D Mode: Photon Dose Per Fraction: 3 Gy Prescribed Dose (Delivered / Prescribed): 15 Gy / 30 Gy Prescribed Fxs (Delivered / Prescribed): 5 / 10   Plan Name: Spine_L2-S5 Site: Lumbar Spine Technique: 3D Mode: Photon Dose Per Fraction: 3 Gy Prescribed Dose (Delivered / Prescribed): 30 Gy / 30 Gy Prescribed Fxs (Delivered / Prescribed): 10 / 10   Plan Name: Brain_+C2 Site: Brain Technique: 3D Mode: Photon Dose Per Fraction: 2.5 Gy Prescribed Dose (Delivered / Prescribed): 15 Gy / 15 Gy Prescribed Fxs (Delivered / Prescribed): 6 / 6   Plan Name: Brain_Only Site: Brain Technique: 3D Mode: Photon Dose Per Fraction: 2.5 Gy Prescribed Dose (Delivered / Prescribed): 20 Gy / 20 Gy Prescribed Fxs (Delivered / Prescribed): 8 / 8     ==========ON TREATMENT VISIT DATES========== 2023-01-17, 2023-01-18, 2023-01-24, 2023-01-31, 2023-02-07     ==========UPCOMING VISITS==========       ==========APPENDIX - ON TREATMENT VISIT NOTES==========   See weekly On Treatment Notes in Epic for details.

## 2023-02-21 ENCOUNTER — Ambulatory Visit: Payer: Commercial Managed Care - PPO | Admitting: Hematology and Oncology

## 2023-02-22 ENCOUNTER — Ambulatory Visit: Payer: Commercial Managed Care - PPO

## 2023-02-22 ENCOUNTER — Other Ambulatory Visit: Payer: Commercial Managed Care - PPO

## 2023-02-22 ENCOUNTER — Ambulatory Visit: Payer: Commercial Managed Care - PPO | Admitting: Adult Health

## 2023-02-25 ENCOUNTER — Ambulatory Visit: Payer: Commercial Managed Care - PPO

## 2023-02-25 ENCOUNTER — Ambulatory Visit: Payer: Commercial Managed Care - PPO | Admitting: Nurse Practitioner

## 2023-02-25 ENCOUNTER — Other Ambulatory Visit: Payer: Commercial Managed Care - PPO

## 2023-03-01 ENCOUNTER — Encounter: Payer: Self-pay | Admitting: Hematology and Oncology

## 2023-03-02 ENCOUNTER — Encounter: Payer: Self-pay | Admitting: Hematology and Oncology

## 2023-03-02 ENCOUNTER — Encounter: Payer: Self-pay | Admitting: Radiation Oncology

## 2023-03-07 ENCOUNTER — Ambulatory Visit: Payer: Commercial Managed Care - PPO

## 2023-03-10 ENCOUNTER — Encounter: Payer: Self-pay | Admitting: Radiation Oncology

## 2023-03-10 ENCOUNTER — Ambulatory Visit
Admission: RE | Admit: 2023-03-10 | Discharge: 2023-03-10 | Disposition: A | Discharge status: Home / Self Care | Payer: Commercial Managed Care - PPO | Source: Ambulatory Visit | Attending: Radiation Oncology | Admitting: Radiation Oncology

## 2023-03-10 NOTE — Progress Notes (Signed)
Patient identity verified x2.  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  Ruel Favors, LPN

## 2023-03-15 ENCOUNTER — Ambulatory Visit: Payer: Commercial Managed Care - PPO

## 2023-03-15 ENCOUNTER — Other Ambulatory Visit: Payer: Commercial Managed Care - PPO

## 2023-03-15 ENCOUNTER — Ambulatory Visit: Payer: Commercial Managed Care - PPO | Admitting: Hematology and Oncology

## 2023-03-18 ENCOUNTER — Ambulatory Visit: Payer: Commercial Managed Care - PPO

## 2023-03-18 ENCOUNTER — Other Ambulatory Visit: Payer: Commercial Managed Care - PPO

## 2023-03-18 ENCOUNTER — Ambulatory Visit: Payer: Commercial Managed Care - PPO | Admitting: Hematology and Oncology

## 2023-03-25 ENCOUNTER — Encounter: Payer: Self-pay | Admitting: *Deleted

## 2023-03-25 NOTE — Progress Notes (Signed)
Received call from Dr. 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  MD out of office, RN will alert MD when he returns.

## 2023-04-30 DEATH — deceased
